# Patient Record
Sex: Male | Born: 1945
Health system: Southern US, Community
[De-identification: ages and names within clinical notes are randomized; demographics above are authoritative.]

## PROBLEM LIST (undated history)

## (undated) DIAGNOSIS — Z95810 Presence of automatic (implantable) cardiac defibrillator: Secondary | ICD-10-CM

## (undated) DIAGNOSIS — C61 Malignant neoplasm of prostate: Secondary | ICD-10-CM

## (undated) DIAGNOSIS — A419 Sepsis, unspecified organism: Secondary | ICD-10-CM

## (undated) DIAGNOSIS — Z973 Presence of spectacles and contact lenses: Secondary | ICD-10-CM

## (undated) DIAGNOSIS — IMO0002 Reserved for concepts with insufficient information to code with codable children: Secondary | ICD-10-CM

## (undated) DIAGNOSIS — I472 Ventricular tachycardia, unspecified: Secondary | ICD-10-CM

## (undated) DIAGNOSIS — R6521 Severe sepsis with septic shock: Secondary | ICD-10-CM

## (undated) DIAGNOSIS — F419 Anxiety disorder, unspecified: Secondary | ICD-10-CM

## (undated) DIAGNOSIS — I519 Heart disease, unspecified: Secondary | ICD-10-CM

## (undated) DIAGNOSIS — I428 Other cardiomyopathies: Secondary | ICD-10-CM

## (undated) DIAGNOSIS — R972 Elevated prostate specific antigen [PSA]: Secondary | ICD-10-CM

## (undated) HISTORY — PX: APPENDECTOMY: SHX54

## (undated) HISTORY — DX: Heart disease, unspecified: I51.9

## (undated) HISTORY — DX: Anxiety disorder, unspecified: F41.9

## (undated) HISTORY — DX: Ventricular tachycardia: I47.2

## (undated) HISTORY — DX: Reserved for concepts with insufficient information to code with codable children: IMO0002

## (undated) HISTORY — DX: Ventricular tachycardia, unspecified: I47.20

## (undated) HISTORY — DX: Sepsis, unspecified organism: A41.9

## (undated) HISTORY — DX: Other cardiomyopathies: I42.8

## (undated) HISTORY — DX: Severe sepsis with septic shock: R65.21

---

## 2000-05-03 ENCOUNTER — Encounter (INDEPENDENT_AMBULATORY_CARE_PROVIDER_SITE_OTHER): Payer: Self-pay

## 2000-05-03 ENCOUNTER — Inpatient Hospital Stay (HOSPITAL_COMMUNITY): Admission: EM | Admit: 2000-05-03 | Discharge: 2000-05-23 | Payer: Self-pay | Admitting: Emergency Medicine

## 2000-05-03 ENCOUNTER — Encounter: Payer: Self-pay | Admitting: Emergency Medicine

## 2000-05-08 ENCOUNTER — Encounter: Payer: Self-pay | Admitting: General Surgery

## 2000-05-10 ENCOUNTER — Encounter: Payer: Self-pay | Admitting: General Surgery

## 2000-05-11 ENCOUNTER — Encounter: Payer: Self-pay | Admitting: General Surgery

## 2000-05-13 ENCOUNTER — Encounter: Payer: Self-pay | Admitting: General Surgery

## 2000-05-14 ENCOUNTER — Encounter: Payer: Self-pay | Admitting: General Surgery

## 2000-05-14 ENCOUNTER — Encounter: Payer: Self-pay | Admitting: Anesthesiology

## 2000-11-18 ENCOUNTER — Encounter (INDEPENDENT_AMBULATORY_CARE_PROVIDER_SITE_OTHER): Payer: Self-pay | Admitting: Specialist

## 2000-11-18 ENCOUNTER — Other Ambulatory Visit: Admission: RE | Admit: 2000-11-18 | Discharge: 2000-11-18 | Payer: Self-pay | Admitting: Urology

## 2000-12-06 ENCOUNTER — Encounter (INDEPENDENT_AMBULATORY_CARE_PROVIDER_SITE_OTHER): Payer: Self-pay | Admitting: Specialist

## 2000-12-06 ENCOUNTER — Other Ambulatory Visit: Admission: RE | Admit: 2000-12-06 | Discharge: 2000-12-06 | Payer: Self-pay | Admitting: Urology

## 2001-02-17 ENCOUNTER — Encounter: Admission: RE | Admit: 2001-02-17 | Discharge: 2001-02-17 | Payer: Self-pay | Admitting: General Surgery

## 2001-02-17 ENCOUNTER — Encounter: Payer: Self-pay | Admitting: General Surgery

## 2001-02-18 ENCOUNTER — Ambulatory Visit (HOSPITAL_BASED_OUTPATIENT_CLINIC_OR_DEPARTMENT_OTHER): Admission: RE | Admit: 2001-02-18 | Discharge: 2001-02-18 | Payer: Self-pay | Admitting: General Surgery

## 2009-02-14 ENCOUNTER — Ambulatory Visit: Payer: Self-pay | Admitting: Cardiovascular Disease

## 2009-02-14 ENCOUNTER — Inpatient Hospital Stay (HOSPITAL_COMMUNITY): Admission: EM | Admit: 2009-02-14 | Discharge: 2009-02-20 | Payer: Self-pay | Admitting: Emergency Medicine

## 2009-02-15 ENCOUNTER — Encounter: Payer: Self-pay | Admitting: Internal Medicine

## 2009-02-15 ENCOUNTER — Encounter: Payer: Self-pay | Admitting: Cardiovascular Disease

## 2009-02-18 ENCOUNTER — Encounter: Payer: Self-pay | Admitting: Cardiovascular Disease

## 2009-02-19 HISTORY — PX: CARDIAC DEFIBRILLATOR PLACEMENT: SHX171

## 2009-02-20 ENCOUNTER — Encounter: Payer: Self-pay | Admitting: Internal Medicine

## 2009-02-22 ENCOUNTER — Encounter: Payer: Self-pay | Admitting: Internal Medicine

## 2009-03-07 ENCOUNTER — Encounter: Payer: Self-pay | Admitting: Internal Medicine

## 2009-03-07 ENCOUNTER — Ambulatory Visit: Payer: Self-pay

## 2009-03-08 ENCOUNTER — Telehealth (INDEPENDENT_AMBULATORY_CARE_PROVIDER_SITE_OTHER): Payer: Self-pay | Admitting: *Deleted

## 2009-03-12 ENCOUNTER — Encounter: Payer: Self-pay | Admitting: Internal Medicine

## 2009-03-21 DIAGNOSIS — R55 Syncope and collapse: Secondary | ICD-10-CM

## 2009-03-21 DIAGNOSIS — Z85828 Personal history of other malignant neoplasm of skin: Secondary | ICD-10-CM

## 2009-03-21 DIAGNOSIS — I472 Ventricular tachycardia: Secondary | ICD-10-CM

## 2009-03-25 ENCOUNTER — Ambulatory Visit: Payer: Self-pay | Admitting: Internal Medicine

## 2009-03-25 DIAGNOSIS — I4891 Unspecified atrial fibrillation: Secondary | ICD-10-CM

## 2009-05-12 ENCOUNTER — Emergency Department (HOSPITAL_COMMUNITY): Admission: EM | Admit: 2009-05-12 | Discharge: 2009-05-12 | Payer: Self-pay | Admitting: Emergency Medicine

## 2009-05-12 ENCOUNTER — Ambulatory Visit: Payer: Self-pay | Admitting: Cardiology

## 2009-05-12 ENCOUNTER — Telehealth (INDEPENDENT_AMBULATORY_CARE_PROVIDER_SITE_OTHER): Payer: Self-pay | Admitting: Physician Assistant

## 2009-05-31 ENCOUNTER — Ambulatory Visit: Payer: Self-pay | Admitting: Internal Medicine

## 2009-09-02 ENCOUNTER — Ambulatory Visit: Payer: Self-pay | Admitting: Internal Medicine

## 2009-12-04 ENCOUNTER — Ambulatory Visit: Payer: Self-pay | Admitting: Internal Medicine

## 2009-12-06 ENCOUNTER — Encounter: Payer: Self-pay | Admitting: Internal Medicine

## 2010-03-05 ENCOUNTER — Ambulatory Visit: Payer: Self-pay | Admitting: Internal Medicine

## 2010-03-06 ENCOUNTER — Ambulatory Visit: Payer: Self-pay | Admitting: Internal Medicine

## 2010-03-12 ENCOUNTER — Encounter: Payer: Self-pay | Admitting: Internal Medicine

## 2010-04-03 ENCOUNTER — Telehealth: Payer: Self-pay | Admitting: Internal Medicine

## 2010-04-29 NOTE — Assessment & Plan Note (Signed)
Summary: pc2   Primary Provider:  [name]  CC:  pc2.  Pt wondering about genetic test results..  History of Present Illness: The patient presents today for routine electrophysiology followup. He reports doing very well since last being seen in our clinic. The patient denies symptoms of palpitations, chest pain, shortness of breath, orthopnea, PND, lower extremity edema, dizziness, presyncope, syncope, or neurologic sequela. The patient is tolerating medications without difficulties and is otherwise without complaint today.   Current Medications (verified): 1)  Metoprolol Tartrate 25 Mg Tabs (Metoprolol Tartrate) .... Take 1 1/2 Tablets By Mouth Twice A Day 2)  Vitamin E .... Once Daily 3)  Vitamin C .... Once Daily 4)  Fish Oil .... Once Daily 5)  Calcium, Magnesium, Zinc .... Once Daily  Allergies (verified): No Known Drug Allergies  Past History:  Past Medical History: Reviewed history from 03/25/2009 and no changes required. ICD (SJM) VENTRICULAR TACHYCARDIA ARRHYTHMOGENIC RIGHT VENTRICULAR DYSPLASIA ATRIAL FIBRILLATION AND ATRIAL FLUTTER    Past Surgical History: Reviewed history from 03/25/2009 and no changes required.  1. Appendectomy.   2.  implantation of a [employer]. Jude Medical Fortify DR implantable cardioverter-defibrillator for ventricular tachycardia     and syncope 02/19/09  Social History: Reviewed history from 03/21/2009 and no changes required. The patient is divorced with 1 child.  He is self-   employed.  He admits to social alcohol use.  He denies use of tobacco or   drugs.   Review of Systems       All systems are reviewed and negative except as listed in the HPI.   Vital Signs:  Patient profile:   65 year old male Height:      74 inches Weight:      221 pounds BMI:     28.48 Pulse rate:   61 / minute Pulse rhythm:   regular BP sitting:   122 / 84  (left arm) Cuff size:   regular  Vitals Entered By: Judithe Modest CMA (September 02, 2009 11:22  AM)  Physical Exam  General:  Well developed, well nourished, in no acute distress. Head:  normocephalic and atraumatic Eyes:  PERRLA/EOM intact; conjunctiva and lids normal. Mouth:  Teeth, gums and palate normal. Oral mucosa normal. Neck:  Neck supple, no JVD. No masses, thyromegaly or abnormal cervical nodes. Lungs:  Clear bilaterally to auscultation and percussion. Heart:  Non-displaced PMI, chest non-tender; regular rate and rhythm, S1, S2 without murmurs, rubs or gallops. Carotid upstroke normal, no bruit. Normal abdominal aortic size, no bruits. Femorals normal pulses, no bruits. Pedals normal pulses. No edema, no varicosities. Abdomen:  Bowel sounds positive; abdomen soft and non-tender without masses, organomegaly, or hernias noted. No hepatosplenomegaly. Msk:  Back normal, normal gait. Muscle strength and tone normal. Pulses:  pulses normal in all 4 extremities Extremities:  No clubbing or cyanosis. Neurologic:  Alert and oriented x 3.   PPM Specifications Following MD:  Hillis Range, MD     PPM Vendor:  [employer] Jude     PPM Model Number:  [id number]     PPM Serial Number:  [id number] DOI:  02/19/2009     PPM Implanting MD:  Hillis Range, MD  Lead 1    Location: RA     DOI: 02/19/2009     Model #: 1888TC     Serial #: [id number]     Status: active Lead 2    Location: RV     DOI: 02/19/2009     Model #:  [id number]     Serial #: [id number]     Status: active  ICD Specifications Following MD:  Hillis Range, MD     ICD Vendor:  St Jude     ICD Model Number:  [id number]     ICD Serial Number:  [id number] ICD DOI:  02/19/2009     ICD Implanting MD:  Hillis Range, MD  Lead 1:    Location: RA     DOI: 02/19/2009     Model #: 1888TC     Serial #: [id number]     Status: active Lead 2:    Location: RV     DOI: 02/19/2009     Model #: [id number]     Serial #: [id number]     Status: active  Indications::  ARVD   ICD Follow Up Battery Voltage:  87% V     Charge Time:  7.9 seconds     Battery Est. Longevity:   7.6-8.77YRS Underlying rhythm:  SR ICD Dependent:  No       ICD Device Measurements Atrium:  Amplitude: 3.8 mV, Impedance: 410 ohms, Threshold: 1.25 V at 0.5 msec Right Ventricle:  Amplitude: 6.6 mV, Impedance: 390 ohms, Threshold: 0.75 V at 0.5 msec Shock Impedance: 45 ohms   Episodes MS Episodes:  6     Percent Mode Switch:  <1%     Coumadin:  No Shock:  0     ATP:  5     Atrial Pacing:  <1%     Ventricular Pacing:  2.7%  Brady Parameters Mode VVI     Lower Rate Limit:  40      Tachy Zones VF:  200     VT:  171     Tech Comments:  ALL EPISODES AF W/RVR. WILL DISCUSS W/DR Oliana Gowens NEED FOR REPROGRAMMING TACHY ZONE.  NORMAL THRESHOLD TESTING. CHANGED VF # OF INTERVALS FROM 12 TO 18 PER DR Babita Amaker.  Vella Kohler  September 02, 2009 11:40 AM  Impression & Recommendations:  Problem # 1:  VENTRICULAR TACHYCARDIA (ICD-427.1)  Doing well without further VT Normal ICD function  His updated medication list for this problem includes:    Metoprolol Tartrate 50 Mg Tabs (Metoprolol tartrate) ..... One by mouth two times a day  Problem # 2:  ATRIAL FIBRILLATION (ICD-427.31)  short episodes of afib with RVR will increase metoprolol to 50mg  two times a day today ASA 325mg  daily (CHADS 2 score is 0)  His updated medication list for this problem includes:    Metoprolol Tartrate 50 Mg Tabs (Metoprolol tartrate) ..... One by mouth two times a day  Problem # 3:  * ARRHYTHMOGENIC RIGHT VENTRICULAR DYSPLASIA Normal ICD function genetic testing was indeterminant we will follow  Patient Instructions: 1)  Your physician recommends that you schedule a follow-up appointment in: 3 MONTHS with Dr Johney Frame 2)  Your physician has recommended you make the following change in your medication: increase Metoprolol to 50mg  two times a day Prescriptions: METOPROLOL TARTRATE 50 MG TABS (METOPROLOL TARTRATE) one by mouth two times a day  #60 x 3   Entered by:   Dennis Bast, RN, BSN   Authorized by:   Hillis Range, MD   Signed by:   Dennis Bast, RN, BSN on 09/02/2009   Method used:   Electronically to        [facility] #[id number]* (retail)       [address]       [place]  [place], [place]  [address]       Ph: [phone]       Fax: [phone]   RxID:   [id number] METOPROLOL TARTRATE 50 MG TABS (METOPROLOL TARTRATE) one by mouth two times a day  #60 x 3   Entered by:   Dennis Bast, RN, BSN   Authorized by:   Hillis Range, MD   Signed by:   Dennis Bast, RN, BSN on 09/02/2009   Method used:   Print then Give to Patient   RxID:   [id number]

## 2010-04-29 NOTE — Letter (Signed)
Summary: [facility]   Imported By: Roderic Ovens 06/20/2009 11:11:35  _____________________________________________________________________  External Attachment:    Type:   Image     Comment:   External Document

## 2010-04-29 NOTE — Assessment & Plan Note (Signed)
Summary: rov/ gd   Visit Type:  Follow-up Primary Provider:  [name]   History of Present Illness: The patient presents today for routine electrophysiology followup. He reports doing very well since his recent ER visit after receiving a shock for afib with RVR.  He reports compliance with beta blockers since that time. The patient denies symptoms of palpitations, chest pain, shortness of breath, orthopnea, PND, lower extremity edema, dizziness, presyncope, syncope, or neurologic sequela. The patient is tolerating medications without difficulties and is otherwise without complaint today.   Current Medications (verified): 1)  Metoprolol Tartrate 25 Mg Tabs (Metoprolol Tartrate) .... Take 1 1/2 Tablets By Mouth Twice A Day 2)  Vitamin E .... Once Daily 3)  Vitamin C .... Once Daily 4)  Fish Oil .... Once Daily 5)  Calcium, Magnesium, Zinc .... Once Daily  Allergies (verified): No Known Drug Allergies  Past History:  Past Medical History: Reviewed history from 03/25/2009 and no changes required. ICD (SJM) VENTRICULAR TACHYCARDIA ARRHYTHMOGENIC RIGHT VENTRICULAR DYSPLASIA ATRIAL FIBRILLATION AND ATRIAL FLUTTER    Past Surgical History: Reviewed history from 03/25/2009 and no changes required.  1. Appendectomy.   2.  implantation of a [employer]. Jude Medical Fortify DR implantable cardioverter-defibrillator for ventricular tachycardia     and syncope 02/19/09  Social History: Reviewed history from 03/21/2009 and no changes required. The patient is divorced with 1 child.  He is self-   employed.  He admits to social alcohol use.  He denies use of tobacco or   drugs.   Review of Systems       All systems are reviewed and negative except as listed in the HPI.   Vital Signs:  Patient profile:   65 year old male Height:      74 inches Weight:      218 pounds BMI:     28.09 Pulse rate:   68 / minute BP sitting:   100 / 56  (left arm)  Vitals Entered By: Laurance Flatten CMA (May 31, 2009 11:21 AM)  Physical Exam  General:  Well developed, well nourished, in no acute distress. Head:  normocephalic and atraumatic Eyes:  PERRLA/EOM intact; conjunctiva and lids normal. Mouth:  Teeth, gums and palate normal. Oral mucosa normal. Neck:  Neck supple, no JVD. No masses, thyromegaly or abnormal cervical nodes. Chest Wall:  ICD pocket is well healed Lungs:  Clear bilaterally to auscultation and percussion. Heart:  Non-displaced PMI, chest non-tender; regular rate and rhythm, S1, S2 without murmurs, rubs or gallops. Carotid upstroke normal, no bruit. Normal abdominal aortic size, no bruits. Femorals normal pulses, no bruits. Pedals normal pulses. No edema, no varicosities. Abdomen:  Bowel sounds positive; abdomen soft and non-tender without masses, organomegaly, or hernias noted. No hepatosplenomegaly. Msk:  Back normal, normal gait. Muscle strength and tone normal. Pulses:  pulses normal in all 4 extremities Extremities:  No clubbing or cyanosis. Neurologic:  Alert and oriented x 3.  strength/sensation are intact Skin:  Intact without lesions or rashes. Cervical Nodes:  no significant adenopathy Psych:  Normal affect.   PPM Specifications Following MD:  Hillis Range, MD     PPM Vendor:  [employer] Jude     PPM Model Number:  [id number]     PPM Serial Number:  [id number] PPM DOI:  02/19/2009     PPM Implanting MD:  Hillis Range, MD  Lead 1    Location: RA     DOI: 02/19/2009     Model #: [id number]  Serial #: [id number]     Status: active Lead 2    Location: RV     DOI: 02/19/2009     Model #: 1308     Serial #: [id number]     Status: active  ICD Specifications Following MD:  Hillis Range, MD     ICD Vendor:  [employer] Jude     ICD Model Number:  [phone]     ICD Serial Number:  [id number] ICD DOI:  02/19/2009     ICD Implanting MD:  Hillis Range, MD  Lead 1:    Location: RA     DOI: 02/19/2009     Model #: 1888TC     Serial #: [id number]     Status: active Lead 2:    Location: RV     DOI: 02/19/2009      Model #: 4403     Serial #: [id number]     Status: active  Indications::  ARVD   ICD Follow Up Remote Check?  No Battery Voltage:  90% V     Charge Time:  8 seconds     Battery Est. Longevity:  8.8 YEARS Underlying rhythm:  SR ICD Dependent:  No       ICD Device Measurements Atrium:  Amplitude: 3.9 mV, Impedance: 460 ohms,  Right Ventricle:  Amplitude: 11.8 mV, Impedance: 440 ohms, Threshold: 0.75 V at 0.5 msec Shock Impedance: 47 ohms   Episodes Percent Mode Switch:  1.3%     Coumadin:  No Shock:  0     ATP:  1     Nonsustained:  0     Ventricular Pacing:  5.9%  Brady Parameters Mode VVI     Lower Rate Limit:  40      Tachy Zones VF:  200     VT:  171     Tech Comments:  Normal device function.  RV output decreased to 2.5V today.  Pt had 1 episode of VT in VF zone that terminated with ATP during charging.  No details on mode switch episodes because programmed VVI.  ROV per Dr Johney Frame. Gypsy Balsam RN BSN  May 31, 2009 11:29 AM  MD Comments:  agree  Impression & Recommendations:  Problem # 1:  VENTRICULAR TACHYCARDIA (ICD-427.1) The patient has a h/o vt.  He is doing well with low dose metoprolol. No changes today. His updated medication list for this problem includes:    Metoprolol Tartrate 25 Mg Tabs (Metoprolol tartrate) .[name]... Take 1 1/2 tablets by mouth twice a day  Problem # 2:  ATRIAL FIBRILLATION (ICD-427.31) The patient has been documented to have paroxysmal afib with his ICD.  He recently received a shock for afib with RVR when off of metoprolol (compliance).  I have stressed the importance of metoprolol compliance.  If he has further afib, then I will consider admission for sotalol load.  His CHADs2 score is 0.  We will therefore continue daily ASA 325mg  daily at this time. His updated medication list for this problem includes:    Metoprolol Tartrate 25 Mg Tabs (Metoprolol tartrate) .[name]... Take 1 1/2 tablets by mouth twice a day  Problem # 3:  AICD [employer] [employer]   MODEL 1888TC- 46 (ICD-V45.02) Normal ICD function as above  Patient Instructions: 1)  Your physician recommends that you schedule a follow-up appointment in: 3 months with Dr Johney Frame

## 2010-04-29 NOTE — Letter (Signed)
Summary: Device-Delinquent Phone Journalist, newspaper, Main Office  [address]   [place], [place] [address]   Phone: [phone]  Fax: [phone]     December 06, 2009 MRN: [id number]   [facility] [place] [address] [place], [place]  [address]   Dear Robert Ochoa,  According to our records, you were scheduled for a device phone transmission on 12-05-2009.     We did not receive any results from this check.  If you transmitted on your scheduled day, please call us to help troubleshoot your system.  If you forgot to send your transmission, please send one upon receipt of this letter.  Thank you,   Architectural technologist [facility]

## 2010-04-29 NOTE — Assessment & Plan Note (Signed)
Summary: per check out/sf   Visit Type:  Pacemaker check Primary Kirti Carl:  Robert Ochoa   History of Present Illness: The patient presents today for routine electrophysiology followup. He reports doing very well since last being seen in our clinic. The patient denies symptoms of palpitations, chest pain, shortness of breath, orthopnea, PND, lower extremity edema, dizziness, presyncope, syncope, or neurologic sequela. The patient is tolerating medications without difficulties and is otherwise without complaint today.   Current Medications (verified): 1)  Metoprolol Tartrate 50 Mg Tabs (Metoprolol Tartrate) .[name].. 1 1/2 Tablets  By Mouth Two Times A Day. or As Directed. 2)  Vitamin E 400 Unit Caps (Vitamin E) .[name].. 1 Cap Once Daily 3)  Vitamin C 500 Mg Tabs (Ascorbic Acid) .[name].. 1 Tab Once Daily 4)  Fish Oil 1000 Mg Caps (Omega-3 Fatty Acids) .[name].. 1 Cap Once Daily 5)  Calcium-Magnesium-Zinc 333-133-8.3 Mg Tabs (Calcium-Magnesium-Zinc) .[name].. 1 Tab Once Daily  Allergies (verified): No Known Drug Allergies  Past History:  Past Medical History: Reviewed history from 03/25/2009 and no changes required. ICD (SJM) VENTRICULAR TACHYCARDIA ARRHYTHMOGENIC RIGHT VENTRICULAR DYSPLASIA ATRIAL FIBRILLATION AND ATRIAL FLUTTER    Past Surgical History: Reviewed history from 03/25/2009 and no changes required.  1. Appendectomy.   2.  implantation of a [employer]. Jude Medical Fortify DR implantable cardioverter-defibrillator for ventricular tachycardia     and syncope 02/19/09  Social History: Reviewed history from 03/21/2009 and no changes required. The patient is divorced with 1 child.  He is self-   employed.  He admits to social alcohol use.  He denies use of tobacco or   drugs.   Review of Systems       All systems are reviewed and negative except as listed in the HPI.   Vital Signs:  Patient profile:   65 year old male Height:      74 inches Weight:      227.25 pounds BMI:     29.28 Pulse rate:    62 / minute Pulse rhythm:   regular BP sitting:   122 / 88  (right arm) Cuff size:   large  Vitals Entered By: Robert Ochoa, CMA (March 05, 2010 11:01 AM)  Physical Exam  General:  Well developed, well nourished, in no acute distress. Head:  normocephalic and atraumatic Eyes:  PERRLA/EOM intact; conjunctiva and lids normal. Mouth:  Teeth, gums and palate normal. Oral mucosa normal. Neck:  Neck supple, no JVD. No masses, thyromegaly or abnormal cervical nodes. Chest Wall:  ICD pocket is well healed Lungs:  Clear bilaterally to auscultation and percussion. Heart:  Non-displaced PMI, chest non-tender; regular rate and rhythm, S1, S2 without murmurs, rubs or gallops. Carotid upstroke normal, no bruit. Normal abdominal aortic size, no bruits. Femorals normal pulses, no bruits. Pedals normal pulses. No edema, no varicosities. Abdomen:  Bowel sounds positive; abdomen soft and non-tender without masses, organomegaly, or hernias noted. No hepatosplenomegaly. Msk:  Back normal, normal gait. Muscle strength and tone normal. Extremities:  No clubbing or cyanosis. Neurologic:  Alert and oriented x 3.   PPM Specifications Following MD:  Hillis Range, MD     PPM Vendor:  [employer] Jude     PPM Model Number:  [id number]     PPM Serial Number:  [id number] PPM DOI:  02/19/2009     PPM Implanting MD:  Hillis Range, MD  Lead 1    Location: RA     DOI: 02/19/2009     Model #: [id number]  Serial #: [id number]     Status: active Lead 2    Location: RV     DOI: 02/19/2009     Model #: 4098     Serial #: [id number]     Status: active  PPM Follow Up Pacer Dependent:  No      Episodes Coumadin:  No  ICD Specifications Following MD:  Hillis Range, MD     ICD Vendor:  St Jude     ICD Model Number:  [id number]     ICD Serial Number:  [id number] ICD DOI:  02/19/2009     ICD Implanting MD:  Hillis Range, MD  Lead 1:    Location: RA     DOI: 02/19/2009     Model #: 1888TC     Serial #: [id number]     Status: active Lead 2:     Location: RV     DOI: 02/19/2009     Model #: 4132     Serial #: [id number]     Status: active  Indications::  ARVD   ICD Follow Up Remote Check?  No Battery Voltage:  good V     Charge Time:  8.5 seconds     Battery Est. Longevity:  7.7 years Underlying rhythm:  Brady ICD Dependent:  No       ICD Device Measurements Atrium:  Amplitude: 2.6 mV, Impedance: 450 ohms, Threshold: 1.25 V at 0.5 msec Right Ventricle:  Amplitude: 7.1 mV, Impedance: 410 ohms, Threshold: 0.75 V at 0.5 msec Shock Impedance: 50 ohms   Episodes MS Episodes:  1     Percent Mode Switch:  <1%     Coumadin:  No Shock:  0     ATP:  0     Nonsustained:  0     Atrial Pacing:  1.5%     Ventricular Pacing:  4.5%  Brady Parameters Mode VVI     Lower Rate Limit:  40      Tachy Zones VF:  200     VT:  171     Next Cardiology Appt Due:  05/29/2010 Tech Comments:  No parameter changes.  Device function normal.  No Merlin @ this time. ROV 3 months clinic. Altha Harm, LPN  March 05, 2010 11:44 AM  MD Comments:  agree 22 second episode of afib x 1 no ventricular arrhythmias  Impression & Recommendations:  Problem # 1:  ATRIAL FIBRILLATION (ICD-427.31) stable no changes as this time  Problem # 2:  VENTRICULAR TACHYCARDIA (ICD-427.1) controlled no changes normal ICD function as above  Problem # 3:  * ARRHYTHMOGENIC RIGHT VENTRICULAR DYSPLASIA stable without symptoms of CHF  Patient Instructions: 1)  Your physician recommends that you schedule a follow-up appointment in: 3 months with [facility]. 2)  Your physician recommends that you continue on your current medications as directed. Please refer to the Current Medication list given to you today. 3)  Your physician wants you to follow-up in: 6 months.  You will receive a reminder letter in the mail two months in advance. If you don't receive a letter, please call our office to schedule the follow-up appointment.

## 2010-04-29 NOTE — Assessment & Plan Note (Signed)
Summary: per check out/sf   Visit Type:  Follow-up Primary Provider:  [name]   History of Present Illness: The patient presents today for routine electrophysiology followup. He reports doing very well since last being seen in our clinic. The patient denies symptoms of palpitations, chest pain, shortness of breath, orthopnea, PND, lower extremity edema, dizziness, presyncope, syncope, or neurologic sequela. The patient is tolerating medications without difficulties and is otherwise without complaint today.   Current Medications (verified): 1)  Metoprolol Tartrate 50 Mg Tabs (Metoprolol Tartrate) .... One By Mouth Two Times A Day 2)  Vitamin E .... Once Daily 3)  Vitamin C .... Once Daily 4)  Fish Oil .... Once Daily 5)  Calcium, Magnesium, Zinc .... Once Daily  Allergies (verified): No Known Drug Allergies  Past History:  Past Medical History: Reviewed history from 03/25/2009 and no changes required. ICD (SJM) VENTRICULAR TACHYCARDIA ARRHYTHMOGENIC RIGHT VENTRICULAR DYSPLASIA ATRIAL FIBRILLATION AND ATRIAL FLUTTER    Past Surgical History: Reviewed history from 03/25/2009 and no changes required.  1. Appendectomy.   2.  implantation of a St. Jude Medical Fortify DR implantable cardioverter-defibrillator for ventricular tachycardia     and syncope 02/19/09  Social History: Reviewed history from 03/21/2009 and no changes required. The patient is divorced with 1 child.  He is self-   employed.  He admits to social alcohol use.  He denies use of tobacco or   drugs.   Review of Systems       All systems are reviewed and negative except as listed in the HPI.   Vital Signs:  Patient profile:   65 year old male Height:      74 inches Weight:      224 pounds BMI:     28.86 Pulse rate:   59 / minute BP sitting:   120 / 82  (left arm)  Vitals Entered By: Laurance Flatten CMA (December 04, 2009 10:51 AM)  Physical Exam  General:  Well developed, well nourished, in no acute  distress. Head:  normocephalic and atraumatic Eyes:  PERRLA/EOM intact; conjunctiva and lids normal. Mouth:  Teeth, gums and palate normal. Oral mucosa normal. Neck:  Neck supple, no JVD. No masses, thyromegaly or abnormal cervical nodes. Chest Wall:  ICD pocket is well healed Lungs:  Clear bilaterally to auscultation and percussion. Heart:  Non-displaced PMI, chest non-tender; regular rate and rhythm, S1, S2 without murmurs, rubs or gallops. Carotid upstroke normal, no bruit. Normal abdominal aortic size, no bruits. Femorals normal pulses, no bruits. Pedals normal pulses. No edema, no varicosities. Abdomen:  Bowel sounds positive; abdomen soft and non-tender without masses, organomegaly, or hernias noted. No hepatosplenomegaly. Msk:  Back normal, normal gait. Muscle strength and tone normal. Pulses:  pulses normal in all 4 extremities Extremities:  No clubbing or cyanosis. Neurologic:  Alert and oriented x 3.   PPM Specifications Following MD:  Hillis Range, MD     PPM Vendor:  St Jude     PPM Model Number:  [id number]     PPM Serial Number:  [id number] PPM DOI:  02/19/2009     PPM Implanting MD:  Hillis Range, MD  Lead 1    Location: [facility]     DOI: 02/19/2009     Model #: [id number]     Serial #: [id number]     Status: active Lead 2    Location: RV     DOI: 02/19/2009     Model #: [id number]     Serial #:  [id number]     Status: active  PPM Follow Up Remote Check?  No Pacer Dependent:  No      Episodes Coumadin:  No  ICD Specifications Following MD:  Hillis Range, MD     ICD Vendor:  St Jude     ICD Model Number:  [phone]     ICD Serial Number:  [id number] ICD DOI:  02/19/2009     ICD Implanting MD:  Hillis Range, MD  Lead 1:    Location: RA     DOI: 02/19/2009     Model #: [id number]     Serial #: [id number]     Status: active Lead 2:    Location: RV     DOI: 02/19/2009     Model #: 2694     Serial #: [id number]     Status: active  Indications::  ARVD   ICD Follow Up Remote Check?  No Charge Time:  8.0  seconds     Battery Est. Longevity:  7.5 years Underlying rhythm:  SR ICD Dependent:  No       ICD Device Measurements Atrium:  Amplitude: 3.3 mV, Impedance: 490 ohms, Threshold: 1.25 V at 0.5 msec Right Ventricle:  Amplitude: 7.7 mV, Impedance: 440 ohms, Threshold: 0.75 V at 0.5 msec Shock Impedance: 56 ohms   Episodes MS Episodes:  3     Coumadin:  No Shock:  0     ATP:  0     Nonsustained:  2     Atrial Pacing:  <1%     Ventricular Pacing:  2.7%  Brady Parameters Mode VVI     Lower Rate Limit:  40      Tachy Zones VF:  200     VT:  171     Next Remote Date:  03/06/2010     Next Cardiology Appt Due:  11/29/2010 Tech Comments:  2 VT episodes treated with ATP for A-fib with RVR.  No parameter changes.  Merlin transmissions every 3 months.  ROV 1 year with Dr. Johney Frame. Altha Harm, LPN  December 04, 2009 11:54 AM  MD Comments:  agree.  PT with ATP for afib with RVR.  Impression & Recommendations:  Problem # 1:  ATRIAL FIBRILLATION (ICD-427.31) The patient continues to have very rare and short episodes of afib, though V rates are elevated. We will increase metoprolol to 75mg  two times a day.  I have offered inpatient initiation of sotalol, however, he wishes to continue metoprolol at this time. Continue ASA 325mg  daily for CHADS2 score of 0.  Problem # 2:  AICD ST. J[address] T[address] ST  MODEL 1888TC- 46 (ICD-V45.02) as above  Problem # 3:  VENTRICULAR TACHYCARDIA (ICD-427.1) no further episodes  Problem # 4:  * ARRHYTHMOGENIC RIGHT VENTRICULAR DYSPLASIA stable as above  Patient Instructions: 1)  Your physician recommends that you schedule a follow-up appointment in: 3 months 2)  Your physician has recommended you make the following change in your medication: increase Metoprolol to 75mg  twice a day.
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
## 2010-04-29 NOTE — Cardiovascular Report (Signed)
Summary: Office Visit   Office Visit   Imported By: Roderic Ovens 12/10/2009 12:29:58  _____________________________________________________________________  External Attachment:    Type:   Image     Comment:   External Document

## 2010-05-01 NOTE — Cardiovascular Report (Signed)
Summary: Office Visit   Office Visit   Imported By: Roderic Ovens 03/13/2010 11:52:32  _____________________________________________________________________  External Attachment:    Type:   Image     Comment:   External Document

## 2010-05-01 NOTE — Progress Notes (Signed)
Summary: refill request  Phone Note Refill Request Message from:  Patient  Refills Requested: Medication #1:  METOPROLOL TARTRATE 50 MG TABS 1 1/2 tablets  by mouth two times a day. or as directed. pt wants refill of 25mg  metoprolol since he takes 1 and a half tabs and he has to cut them in half/[facility]   Method Requested: Telephone to Pharmacy Initial call taken by: Glynda Jaeger,  April 03, 2010 10:31 AM  Follow-up for Phone Call        RX called into pharmacy for metoprolol taqrt 25 and 50mg  1 by mouth two times a day per pt request. Pt notified.  Marrion Coy, CNA  April 03, 2010 11:57 AM  Follow-up by: Marrion Coy, CNA,  April 03, 2010 11:57 AM

## 2010-05-01 NOTE — Letter (Signed)
Summary: Device-Delinquent Phone Journalist, newspaper, Main Office  [address]   [place], [place] [address]   Phone: [phone]  Fax: [phone]     March 12, 2010 MRN: [id number]   [facility] [address] [place], [place]  [address]   Dear Robert Ochoa,  According to our records, you were scheduled for a device phone transmission on 03-06-2010.     We did not receive any results from this check.  If you transmitted on your scheduled day, please call us to help troubleshoot your system.  If you forgot to send your transmission, please send one upon receipt of this letter.  Thank you,   Architectural technologist [facility]

## 2010-05-07 NOTE — Letter (Signed)
Summary: [facility] Test Report  [facility] Test Report   Imported By: Marylou Mccoy 04/30/2010 11:30:22  _____________________________________________________________________  External Attachment:    Type:   Image     Comment:   External Document

## 2010-05-27 ENCOUNTER — Encounter (INDEPENDENT_AMBULATORY_CARE_PROVIDER_SITE_OTHER): Payer: Self-pay | Admitting: *Deleted

## 2010-06-05 NOTE — Letter (Signed)
Summary: Appointment - Reschedule  [employer], Main Office  [address]   [place], [place] [address]   Phone: [phone]  Fax: [phone]     May 27, 2010 MRN: [id number]   [facility] [address] [place], [place]  [address]   Dear Robert Ochoa,   Due to a change in our office schedule, your appointment on  06-04-10   at  9:30a             must be changed.  It is very important that we reach you to reschedule this appointment. We look forward to participating in your health care needs. Please contact us at the number listed above at your earliest convenience to reschedule this appointment.     Sincerely,  Glass blower/designer

## 2010-06-11 ENCOUNTER — Encounter (INDEPENDENT_AMBULATORY_CARE_PROVIDER_SITE_OTHER): Payer: Medicare Other | Admitting: Internal Medicine

## 2010-06-11 ENCOUNTER — Encounter: Payer: Self-pay | Admitting: Internal Medicine

## 2010-06-11 DIAGNOSIS — I472 Ventricular tachycardia, unspecified: Secondary | ICD-10-CM

## 2010-06-11 DIAGNOSIS — I4891 Unspecified atrial fibrillation: Secondary | ICD-10-CM

## 2010-06-17 NOTE — Assessment & Plan Note (Signed)
Summary: pcp/lg  [facility] pt rs appt per our resquest/mt   Visit Type:  Follow-up Primary Provider:  [name]   History of Present Illness: The patient presents today for routine electrophysiology followup. He reports doing very well since last being seen in our clinic. The patient denies symptoms of palpitations, chest pain, shortness of breath, orthopnea, PND, lower extremity edema, dizziness, presyncope, syncope, or neurologic sequela. He is unaware of any device therapy delivered.  The patient is tolerating medications without difficulties and is otherwise without complaint today.   Allergies: No Known Drug Allergies  Past History:  Past Medical History: Reviewed history from 03/25/2009 and no changes required. ICD (SJM) VENTRICULAR TACHYCARDIA ARRHYTHMOGENIC RIGHT VENTRICULAR DYSPLASIA ATRIAL FIBRILLATION AND ATRIAL FLUTTER    Past Surgical History: Reviewed history from 03/25/2009 and no changes required.  1. Appendectomy.   2.  implantation of a St. Jude Medical Fortify DR implantable cardioverter-defibrillator for ventricular tachycardia     and syncope 02/19/09  Social History: Reviewed history from 03/21/2009 and no changes required. The patient is divorced with 1 child.  He is self-   employed.  He admits to social alcohol use.  He denies use of tobacco or   drugs.   Review of Systems       All systems are reviewed and negative except as listed in the HPI.   Physical Exam  General:  Well developed, well nourished, in no acute distress. Head:  normocephalic and atraumatic Eyes:  PERRLA/EOM intact; conjunctiva and lids normal. Mouth:  Teeth, gums and palate normal. Oral mucosa normal. Neck:  supple Lungs:  Clear bilaterally to auscultation and percussion. Heart:  Non-displaced PMI, chest non-tender; regular rate and rhythm, S1, S2 without murmurs, rubs or gallops. Carotid upstroke normal, no bruit. Normal abdominal aortic size, no bruits. Femorals normal pulses, no  bruits. Pedals normal pulses. No edema, no varicosities. Abdomen:  Bowel sounds positive; abdomen soft and non-tender without masses, organomegaly, or hernias noted. No hepatosplenomegaly. Msk:  Back normal, normal gait. Muscle strength and tone normal. Extremities:  No clubbing or cyanosis. Neurologic:  Alert and oriented x 3. Skin:  Intact without lesions or rashes. Psych:  Normal affect.   PPM Specifications Following MD:  Hillis Range, MD     PPM Vendor:  St Jude     PPM Model Number:  [id number]     PPM Serial Number:  [id number] PPM DOI:  02/19/2009     PPM Implanting MD:  Hillis Range, MD  Lead 1    Location: RA     DOI: 02/19/2009     Model #: 1888TC     Serial #: [id number]     Status: active Lead 2    Location: RV     DOI: 02/19/2009     Model #: 9562     Serial #: [id number]     Status: active  PPM Follow Up Pacer Dependent:  No      Episodes Coumadin:  No  ICD Specifications Following MD:  Hillis Range, MD     ICD Vendor:  St Jude     ICD Model Number:  [phone]     ICD Serial Number:  [id number] ICD DOI:  02/19/2009     ICD Implanting MD:  Hillis Range, MD  Lead 1:    Location: RA     DOI: 02/19/2009     Model #: [id number]     Serial #: [id number]     Status: active Lead 2:    Location:  RV     DOI: 02/19/2009     Model #: 8657     Serial #: [id number]     Status: active  Indications::  ARVD   ICD Follow Up ICD Dependent:  No      Episodes Coumadin:  No  Brady Parameters Mode VVI     Lower Rate Limit:  40      Tachy Zones VF:  200     VT:  171     MD Comments:  see scanned report from [facility]  Impression & Recommendations:  Problem # 1:  VENTRICULAR TACHYCARDIA (ICD-427.1) The patient returns today for routine follow-up.  Interestingly, he had 6 episodes of VT (average CL ) all successfully treated with ATP x 1.  These episodes all occured between 10:53 and 11:02 am on 05/31/10.  The patient remembers exactly what he was doing at that time.  He was giving a passionate talk at an  [employer].  He denies any cardiac symptoms at the time. Interestingly, his VT is almost always brought on by emotional events. We will increase metoprolol to 75mg  two times a day today.  We will consider sotalol if further VT, though I would like to avoid AAD if possible.  Ablation would also be an option for him.  No ICD changes today.  See scanned report in [facility].  Problem # 2:  * ARRHYTHMOGENIC RIGHT VENTRICULAR DYSPLASIA as above  Problem # 3:  ATRIAL FIBRILLATION (ICD-427.31) no recent recurrence of afib continue ASA

## 2010-06-17 NOTE — Procedures (Signed)
Summary: pcp/lg  [facility] pt rs appt per our resquest/mt   Current Medications (verified): 1)  Metoprolol Tartrate 50 Mg Tabs (Metoprolol Tartrate) .[name].. 1 1/2 Tablets  By Mouth Two Times A Day. or As Directed. 2)  Vitamin C 500 Mg Tabs (Ascorbic Acid) .[name].. 1 Tab Once Daily 3)  Fish Oil 1000 Mg Caps (Omega-3 Fatty Acids) .[name].. 1 Cap Once Daily 4)  Calcium-Magnesium-Zinc 333-133-8.3 Mg Tabs (Calcium-Magnesium-Zinc) .[name].. 1 Tab Once Daily  Allergies (verified): No Known Drug Allergies  PPM Specifications Following MD:  Hillis Range, MD     PPM Vendor:  St Jude     PPM Model Number:  [id number]     PPM Serial Number:  [id number] PPM DOI:  02/19/2009     PPM Implanting MD:  Hillis Range, MD  Lead 1    Location: RA     DOI: 02/19/2009     Model #: 1888TC     Serial #: [id number]     Status: active Lead 2    Location: RV     DOI: 02/19/2009     Model #: 8469     Serial #: [id number]     Status: active  PPM Follow Up Pacer Dependent:  No      Episodes Coumadin:  No  ICD Specifications Following MD:  Hillis Range, MD     ICD Vendor:  St Jude     ICD Model Number:  [id number]     ICD Serial Number:  [id number] ICD DOI:  02/19/2009     ICD Implanting MD:  Hillis Range, MD  Lead 1:    Location: RA     DOI: 02/19/2009     Model #: 1888TC     Serial #: [id number]     Status: active Lead 2:    Location: RV     DOI: 02/19/2009     Model #: 2595     Serial #: [id number]     Status: active  Indications::  ARVD   ICD Follow Up ICD Dependent:  No      Episodes Coumadin:  No  Brady Parameters Mode VVI     Lower Rate Limit:  40      Tachy Zones VF:  200     VT:  171

## 2010-06-18 LAB — CBC
HCT: 46.7 % (ref 39.0–52.0)
Hemoglobin: 15.7 g/dL (ref 13.0–17.0)
MCV: 87.5 fL (ref 78.0–100.0)
Platelets: 170 10*3/uL (ref 150–400)
RDW: 13.8 % (ref 11.5–15.5)
WBC: 8.5 10*3/uL (ref 4.0–10.5)

## 2010-06-18 LAB — BASIC METABOLIC PANEL
BUN: 12 mg/dL (ref 6–23)
Chloride: 106 mEq/L (ref 96–112)
GFR calc non Af Amer: >60 mL/min (ref >60)
Glucose, Bld: 105 mg/dL — ABNORMAL HIGH (ref 70–99)
Potassium: 4.3 mEq/L (ref 3.5–5.1)
Sodium: 138 mEq/L (ref 135–145)

## 2010-06-18 LAB — POCT CARDIAC MARKERS: CKMB, poc: 4 ng/mL (ref 1.0–8.0)

## 2010-06-18 LAB — DIFFERENTIAL
Eosinophils Absolute: 0 10*3/uL (ref 0.0–0.7)
Eosinophils Relative: 0 % (ref 0–5)
Lymphocytes Relative: 12 % (ref 12–46)
Lymphs Abs: 1 10*3/uL (ref 0.7–4.0)
Monocytes Absolute: 0.8 10*3/uL (ref 0.1–1.0)

## 2010-06-26 NOTE — Cardiovascular Report (Signed)
Summary: Office Visit   Office Visit   Imported By: Roderic Ovens 06/17/2010 14:00:24  _____________________________________________________________________  External Attachment:    Type:   Image     Comment:   External Document

## 2010-07-02 LAB — CBC
Hemoglobin: 14.6 g/dL (ref 13.0–17.0)
Hemoglobin: 14.9 g/dL (ref 13.0–17.0)
Hemoglobin: 16.7 g/dL (ref 13.0–17.0)
MCHC: 33.9 g/dL (ref 30.0–36.0)
MCHC: 34.1 g/dL (ref 30.0–36.0)
Platelets: 160 10*3/uL (ref 150–400)
RBC: 4.95 MIL/uL (ref 4.22–5.81)
RBC: 4.97 MIL/uL (ref 4.22–5.81)
RBC: 5 MIL/uL (ref 4.22–5.81)
RBC: 5.02 MIL/uL (ref 4.22–5.81)
RBC: 5.63 MIL/uL (ref 4.22–5.81)
RDW: 13 % (ref 11.5–15.5)
RDW: 13.1 % (ref 11.5–15.5)
WBC: 5.4 10*3/uL (ref 4.0–10.5)
WBC: 6 10*3/uL (ref 4.0–10.5)
WBC: 6.3 10*3/uL (ref 4.0–10.5)
WBC: 6.4 10*3/uL (ref 4.0–10.5)

## 2010-07-02 LAB — DIFFERENTIAL
Basophils Absolute: 0 10*3/uL (ref 0.0–0.1)
Lymphocytes Relative: 19 % (ref 12–46)
Lymphocytes Relative: 26 % (ref 12–46)
Lymphs Abs: 1.1 10*3/uL (ref 0.7–4.0)
Lymphs Abs: 1.7 10*3/uL (ref 0.7–4.0)
Monocytes Absolute: 0.9 10*3/uL (ref 0.1–1.0)
Monocytes Relative: 15 % — ABNORMAL HIGH (ref 3–12)
Neutro Abs: 3.6 10*3/uL (ref 1.7–7.7)
Neutro Abs: 3.6 10*3/uL (ref 1.7–7.7)
Neutrophils Relative %: 64 % (ref 43–77)

## 2010-07-02 LAB — BASIC METABOLIC PANEL
BUN: 11 mg/dL (ref 6–23)
CO2: 27 mEq/L (ref 19–32)
Calcium: 8.7 mg/dL (ref 8.4–10.5)
Calcium: 8.9 mg/dL (ref 8.4–10.5)
Calcium: 9.8 mg/dL (ref 8.4–10.5)
Chloride: 104 mEq/L (ref 96–112)
Creatinine, Ser: 1.06 mg/dL (ref 0.4–1.5)
Creatinine, Ser: 1.18 mg/dL (ref 0.4–1.5)
GFR calc Af Amer: >60 mL/min (ref >60)
GFR calc Af Amer: >60 mL/min (ref >60)
GFR calc Af Amer: >60 mL/min (ref >60)
GFR calc non Af Amer: >60 mL/min (ref >60)
Glucose, Bld: 95 mg/dL (ref 70–99)
Potassium: 4 mEq/L (ref 3.5–5.1)
Sodium: 138 mEq/L (ref 135–145)
Sodium: 140 mEq/L (ref 135–145)

## 2010-07-02 LAB — POCT I-STAT, CHEM 8
BUN: 18 mg/dL (ref 6–23)
Chloride: 105 mEq/L (ref 96–112)
Creatinine, Ser: 0.8 mg/dL (ref 0.4–1.5)
Glucose, Bld: 98 mg/dL (ref 70–99)
Potassium: 4 mEq/L (ref 3.5–5.1)
Sodium: 139 mEq/L (ref 135–145)

## 2010-07-02 LAB — APTT: aPTT: 34 seconds (ref 24–37)

## 2010-07-02 LAB — PROTIME-INR
INR: 1.09 (ref 0.00–1.49)
INR: 1.1 (ref 0.00–1.49)
Prothrombin Time: 14.1 seconds (ref 11.6–15.2)

## 2010-07-02 LAB — HEPARIN LEVEL (UNFRACTIONATED): Heparin Unfractionated: 0.7 IU/mL (ref 0.30–0.70)

## 2010-07-02 LAB — LIPID PANEL
Cholesterol: 146 mg/dL (ref 0–200)
HDL: 43 mg/dL (ref >39)
Total CHOL/HDL Ratio: 3.4 RATIO
VLDL: 10 mg/dL (ref 0–40)

## 2010-07-02 LAB — COMPREHENSIVE METABOLIC PANEL
BUN: 17 mg/dL (ref 6–23)
Calcium: 8.6 mg/dL (ref 8.4–10.5)
Glucose, Bld: 119 mg/dL — ABNORMAL HIGH (ref 70–99)
Sodium: 138 mEq/L (ref 135–145)
Total Protein: 5.8 g/dL — ABNORMAL LOW (ref 6.0–8.3)

## 2010-07-02 LAB — CK TOTAL AND CKMB (NOT AT ARMC)
CK, MB: 3.7 ng/mL (ref 0.3–4.0)
Total CK: 172 U/L (ref 7–232)

## 2010-07-02 LAB — POCT CARDIAC MARKERS
CKMB, poc: 3 ng/mL (ref 1.0–8.0)
Myoglobin, poc: 129 ng/mL (ref 12–200)
Troponin i, poc: <0.05 ng/mL (ref 0.00–0.09)

## 2010-07-02 LAB — BRAIN NATRIURETIC PEPTIDE: Pro B Natriuretic peptide (BNP): 50 pg/mL (ref 0.0–100.0)

## 2010-07-02 LAB — CARDIAC PANEL(CRET KIN+CKTOT+MB+TROPI)
CK, MB: 3.4 ng/mL (ref 0.3–4.0)
Relative Index: 1.9 (ref 0.0–2.5)
Total CK: 179 U/L (ref 7–232)
Troponin I: 0.03 ng/mL (ref 0.00–0.06)

## 2010-08-01 ENCOUNTER — Encounter: Payer: Medicare Other | Admitting: Internal Medicine

## 2010-08-12 NOTE — Letter (Signed)
March 25, 2009     RE:  Robert Ochoa, Robert Ochoa  MRN:  [id number]  /  DOB:  01-12-1946   To whom it may concern:   I am writing this letter of medical necessity requesting genetic testing  for [facility].  Robert Ochoa is a very pleasant 65 year old gentleman  who recently presented with syncope and was found to have ventricular  tachycardia.  Though his left heart catheterization revealed  angiographically normal coronaries, a cardiac MRI revealed right  ventricular enlargement with a depressed right ventricular ejection  fraction as well as global hypokinesis and basal akinesis.  Signal  average EKG was 3/3 positive for arrhythmogenic right ventricular  dysplasia.  The patient has had paternal family members who have died  suddenly.  Given the patient's syncope and ventricular tachycardia, he  underwent defibrillator implantation for secondary prevention and  treatment of ventricular arrhythmias.  Since that time, the patient has  continued to have episodic ventricular tachycardia.  As I contemplate  further therapeutic strategies for his ventricular tachycardia, I  believe that genetic testing for arrhythmogenic right ventricular  dysplasia would be very beneficial.  Specifically, if the patient is  found to have arrhythmogenic right ventricular dysplasia, then my  inclination would be to avoid catheter ablation if possible.  However,  if he is found to not have right ventricular dysplasia by genetic  testing then I may be more likely to consider catheter ablation.  In  addition, genetic screening will be beneficial in directing the patient  in his lifestyle modifications.  He is eager to resume prior exercise  and rigorous activities.  If he is documented to have arrhythmogenic right ventricular dysplasia,  then his exercise will be significantly limited.  I appreciate your  consideration and assistance in genetic testing of Robert Ochoa  for arrhythmogenic right ventricular  dysplasia as the etiology for his  ventricular arrhythmias.    Sincerely,      Hillis Range, MD  Electronically Signed    JA/MedQ  DD: 03/25/2009  DT: 03/26/2009  Job #: [id number]

## 2010-08-15 NOTE — H&P (Signed)
[facility]  Patient:    Robert Ochoa, Robert Ochoa                          MRN: [id number] Adm. Date:  [id number] Disc. Date: [id number] Attending:  Henrene Dodge                         History and Physical  CHIEF COMPLAINT:  Severe abdominal pain.  HISTORY:  Robert Ochoa is a 65 year old Caucasian male who had lower abdominal pain approximately 10 days ago that was not well-localized and was seen in the [facility] where they did an examination and lab studies. His WBC count returned mildly elevated and he was contacted the following day and he said that he was feeling better and no further evaluations were determined at that time.  Over the next 10 days, he continued to have intermittent episodes of lower abdominal pain and pain predominantly in the back and this would occur, kind of go away, and then over the past 24 hours, he started having significant increase in pain and re-seen by Dr. Doreatha Lew in the [facility] on [address] and she referred him to [place] for evaluation of lower abdominal pain, thought to be appendicitis.  The patient states over the past two days, he has had bloating, decreased appetite, now more definite right lower quadrant tenderness with rebound, and was noted to have an elevated WBC count of 16,000 with a left shift.  PAST MEDICAL HISTORY:  He denies any previous surgery, denies any serious medical problems and is on no chronic medications.  ALLERGIES:  No allergies.  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  PHYSICAL EXAMINATION  VITAL SIGNS:  His temperature was 98.6, pulse 75, respirations 16, blood pressure 105/76.  GENERAL:  He was ______  male, not really in acute distress, but he was definitely tender in the lower abdomen, more so on the right.  LUNGS:  He has got good clear breath sounds  bilaterally.  CARDIAC:  Normal sinus rhythm, without murmur, gallop or rub.  ABDOMEN:  Decreased bowel sounds, with muscle guarding and rebound localized to the right lower quadrant.  RECTAL:  Unremarkable.  EXTREMITIES:  Negative.  NEUROLOGIC:  Unremarkable.  SKIN:  Unremarkable.  ASSESSMENT:  Clinically, I thought he had acute appendicitis and recommended that we proceed on with an acute laparoscopic appendectomy.  Patients urinalysis was unremarkable.  A plain abdominal or acute abdomen film was done that did not show a definite bowel obstruction.  There was one little dilated small bowel in the right lower quadrant like the distal ileum.  Patient will be started on Unasyn and we will proceed with a laparoscopic appendectomy. DD:  06/01/00 TD:  06/01/00 Job: [id number]

## 2010-08-15 NOTE — Discharge Summary (Signed)
[facility]  Patient:    Robert Ochoa, Robert Ochoa                          MRN: [id number] Adm. Date:  [id number] Disc. Date: [id number] Attending:  Henrene Dodge CC:         Doreatha Lew, M.D.   Discharge Summary  DISCHARGE DIAGNOSES:  Acute and chronic inflammatory reaction in the right lower quadrant from a previously ruptured retrocecal appendicitis with a fistula into the terminal ileum and marked retroperitoneal inflammatory reaction.  OPERATIONS: 1. Laparoscopic examination and then an open resection of inflammatory mass    in the terminal ileum and cecum with a primary anastomosis. 2. Approximately two weeks postoperatively he had a repeat exploratory    laparotomy and lysis of adhesions for obstruction secondary to adhesions    down close to the terminal ileal anastomotic area, but about three fourth    of the proximal.  HISTORY OF PRESENT ILLNESS:  Robert Ochoa is a 65 year old Caucasian male who presented to [place] on May 03, 2000, with about a two-week history of lower abdominal pain which he described as kind of low bloating, cramping, and pain in his back.  He had been seen at [facility] and had had an elevated white count.  Approximately two weeks earlier, they had contacted him about this and he stated that he was feeling better.  Then approximately eight or nine days later, he started having definite increase in pain and was reseen.  At that time, he was definitely more tender in the right lower quadrant, bloated, and still complaining, however, as some back discomfort, which he described really on both left and right back areas and was more acutely ill.  They called, thinking that this was acute appendicitis.  Being the surgeon on call, I saw him in the emergency room at [facility] and on examination I was definitely in agreement that he was definitely locally tender in the right lower quadrant with a lot  of muscle guarding and rebound.  He was not febrile.  His white count was 16,000 with a left shift and an acute abdominal series was unremarkable.  I recommended that we proceed on with a laparoscopic appendectomy.  A urinalysis was negative.  He was in agreement.  HOSPITAL COURSE:  The patient was taken on over to the operative suite with induction of general anesthesia.  After the abdomen was relaxed, you could feel a definite mass in the right lower quadrant that appeared to be a little higher than the typical McBurneys area.  After inserting the laparoscope, it was noted that he had a large inflammatory mass in the right lower quadrant. You could see the terminal ileum and the cecum.  The mass itself appeared to be more of a retroperitoneal with a lot of thick nodes in the mesentery and we converted him to an open procedure.  Timothy E. Earlene Plater, M.D., scrubbed in at that time.  Our first opinion when we were trying to free up this large mass that was definitely more of a retroperitoneal than a bowel problem, though that this was probably a lymphoma.  To excise these large matted nodes and this enormous thick, fibrous reaction, it was necessary to resect a short segment of the terminal ileum and cecum.  A handsewn anastomosis between the terminal ileum and cecum was performed after the mass was removed.  The pathologist was asked to come in and  reviewed this.  He feels that this is an inflammatory reaction.  You could not really visualize the appendix, but on examination after dissecting the area for the pathologist, it was noted that the appendix was in the wall of this big inflammatory area.  There was a fistula going into the terminal ileum that looks as if he has had a previous retrocecal appendicitis that has abscessed and then drained spontaneously back into the terminal ileum.  Postoperatively, he was continued on the Unasyn and we were quite relieved on permanent that this was  just a fibrous reaction related to a previous kind of smoldering inflammation.  At some places, this fibrous reaction was 1 inch thick and the mass itself was bigger than a kidney basin all total.  By his second postoperative day, the patient appeared to be doing fine.  We removed the nasogastric tube, kept him NPO, and he looked like he was doing satisfactory.  The following day, we started him on limited liquids.  However, about 24 hours after that, he started becoming bloated and urine output was decreased since he was shifting third space fluid.  Then the IV fluids were increased.  Acute abdominal x-rays now showed dilated small bowel and it was necessary to place an NG tube.  The patient was continued with the increased fluids and NG suction of a large amount of bilious drainage.  This occurred on a weekend and on the following Monday on examination, he was not really acutely tender, but he was obvious bloated and I thought it would be best to have the radiologist check the anastomosis to make sure that we did not have basically an obstruction at the anastomosis since it appeared to be kind of low down.  Ronney Asters, M.D., did the Gastrografin barium enema and could reflux the anastomosis about 4 feet.  This was all decompressed small bowel more proximal to the anastomosis.  We continued to continue the NG suction and ambulation, etc., with the hopes that he was going to open up after kind of a delayed postoperative ileus.  However, after four additional days of basically no improvement, I recommended to the patient that we proceed on with a repeat laparotomy since obviously he was not opening up.  He was not having any fever on elevated white count.  We corrected his fluid status with additional IV fluids, etc.  He was in agreement with this and was taken back to surgery on May 14, 2000, which was really 11 days after he was first admitted.  Thomas B. Samuella Cota, M.D., assisted and  what we found was really where he had had this large inflammatory mass with a loop of small bowel that was actually proximal to the anastomosis  basically had since scarred down where it was laying against this retroperitoneal inflammatory area was the actual point of obstruction.  This was freed up and repositioned.  The anastomosis had been checked preoperatively with the x-ray and on palpation and looking at the anastomosis at the time of surgery, it appeared to be unremarkable.  I did place a hyperalimentation catheter in him at the time of surgery and started him on TPN.  We kept him on NG suction this time until he started having bowel function, which was about the fourth postoperative day and the removed the NG tube, kept him 24 hours of NPO with no NG tube, and then started him on a diet, which he tolerated without problems.  I advanced him on up  to a full liquid diet, tapering off the TPN.  When he appeared to be hemodynamically stable and tolerating his food without any problems, he was ready to be released on May 23, 2000.  The patient is single and lives alone.  We wanted to be sure that he would be able to care for himself at the time of discharge.  We instructed him to see [place] in the office in approximately four or five days.  I advised him to gradually increase from the full liquids to basically a regular diet after 48 hours.  He denied any need of pain medication, except for Tylenol.  His incision was healing at the time of discharge.  His last laboratory studies showed a white count of 8800 and the hematocrit was 38.  His nutritional status appeared to be improving.  He has probably lost about 12 pounds during this hospitalization, but should regain that quickly.  In retrospect, the patient stated that probably about a year ago, he had an episode of lower abdominal pain.  He thought that he had the flu, but he said it took him about three or four weeks to kind of  shake it off.  I expect from the inflammation that we noted that this was the presenting episode of this retrocecal appendicitis that he had formed an abscess that spontaneously drained into the terminal ileum.  At the time of the pathology exam, it was official from the terminal ileum through the base of the appendix with a chronic abscess cavity in the center of this marked fibrosis and inflammatory mass. DD:  06/01/00 TD:  06/02/00 Job: [id number]

## 2010-08-15 NOTE — Op Note (Signed)
. [facility]  Patient:    Robert, Ochoa Visit Number: [id number] MRN: [id number]          Service Type: DSU Location: [facility] Attending Physician:  Henrene Dodge Dictated by:   Anselm Pancoast. Zachery Dakins, M.D. Proc. Date: 02/18/01 Admit Date:  02/18/2001 Discharge Date: 02/18/2001                             Operative Report  PREOPERATIVE DIAGNOSIS:  Hernia, possible spigelian hernia, possible incisional hernia.  POSTOPERATIVE DIAGNOSIS:  Probable incisional hernia, left lateral abdominal incision.  ANESTHESIA:  General.  SURGEON:  Anselm Pancoast. Zachery Dakins, M.D.  OPERATION:  Repair of abdominal wall hernia, left rectus, general anesthesia, with mesh.  HISTORY:  Robert Ochoa is a 65 year old Caucasian male who approximately a year ago started having lower abdominal symptoms, cramping, bloating, lost some weight, and then several months later had a worsening kind of recurrent symptoms, and I saw him, and thought he had acute appendicitis.  He was take to surgery and found to have a chronic inflammation with extensive retroperitoneal inflammation and appeared that he had had a chronic appendicitis with a fistula that had converted back into the cecum.  This area was excised, and postoperatively, he had problems with basically a high-grade partial bowel obstruction, and after approximately three weeks of hospitalization was re-explored with lysis of adhesions through the same small midline incision.  The patient did well and has had no further problems and has been followed in the office on several occasions to regain the weight that he had lost and return to work, and was doing just fine.  Then recently, he presented with a weakness to the left rectus area, not really as far laterally as I would have suspected a spigelian hernia, but it was certainly well away from the midline incision, and with straining, there was a significant bulge her and  I recommended that we repair this with mesh thinking that this is possibly a spigelian or possibly some type of incisional hernia, but I cannot identify any weakness of the midline fascia.  The patients laboratory studies are all normal.  Hematocrit is 44, white count 4.6, and chemistries are all normal.  DESCRIPTION OF PROCEDURE:  The patient was taken to the operative suite, given a gram of Kefzol, inducted in general anesthesia, and with the patient straining, I had marked an area preoperatively to male a transverse incision right over the weakness.  After he had been prepped in a sterile manner, incision with sharp dissection down through the skin and subcutaneous tissue identified this hernia sac.  It appeared that this is coming through the lateral third of the rectus not truly a spigelian hernia but certainly not a midline fascial defect either.  I dissected the hernia sac free circumferentially, and then close the hernia sac with 2-0 Vicryl, and then placed a small piece of mesh which was shaped sort of like a rectangular transversely, and the two proximal sutures medially were placed to straighten the needle to like a Keith needle, so it goes across the midline, and then these were tied.  The two lateral sutures kind of go over lateral to the rectus fascia on the lateral aspect, and then after these had been tied, the fascia was closed over this mesh incorporating the mesh in its midline with interrupted 0 Prolene sutures.  The subcutaneous tissue was closed with 3-0 chromic, and then the skin  was closed transversely with 5-0 nylon sutures. The patient tolerated the procedure nicely.  I did put Marcaine in the fascia, and we will release him after a short stay in the recovery room.  I am still not sure exactly what type of hernia this is.  I think that somehow or another probably the previous incision had weakened this lateral area whether with retraction or what, and this is  probably a variation of an incisional hernia instead of an actual true spigelian hernia.  I could not appreciate any other definite fascial or midline weaknesses when I had my finger in the peritoneal cavity feeling this. Dictated by:   Anselm Pancoast. Zachery Dakins, M.D. Attending Physician:  Henrene Dodge DD:  02/18/01 TD:  02/20/01 Job: [id number]

## 2010-08-15 NOTE — Assessment & Plan Note (Signed)
[facility]                                 ON-CALL NOTE   NAME:Peregrina, Johnatha                       MRN:          [id number]  DATE:03/09/2009                            DOB:          September 30, 1945    PROBLEM:  Mr. Meacham contacted me complaining of some sluggishness and  slight dizziness.  His beta-blocker was recently increased to a total of  25 mg b.i.d., following interrogation of his ICD just last week.  Of  note, I do not have any available records by computer review.  The  patient is still taking his medications, as instructed, and denies any  frank syncope.  Of note, he did not experience any palpitations, prior  to the interrogation, which prompted Dr. Johney Frame to increase the beta-  blocker.   PLAN:  I advised Mr. Merlos to check his blood pressure and pulse prior  to taking his medication.  I placed a parameter of 100 systolic for his  blood pressure, under which he is to take only half his beta-blocker  dose.  He is to call me back directly tomorrow to give me an update and  for further recommendations.     Gene Serpe, PA-C  Electronically Signed    GS/MedQ  DD: 03/09/2009  DT: 03/10/2009  Job #: [id number]

## 2010-08-15 NOTE — Op Note (Signed)
[facility]  Patient:    Robert Ochoa, Robert Ochoa                          MRN: [id number] Proc. Date: 05/03/00 Adm. Date:  [id number] Attending:  Henrene Dodge CC:         Doreatha Lew, M.D.   Operative Report  PREOPERATIVE DIAGNOSES:  Acute appendicitis.  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 Await final path.  OPERATION PERFORMED:  Resection of terminal ileum large area of mesentery and cecum with ileocecal anastomosis or ileo-ascending colon anastomosis.  ANESTHESIA:  General.  SURGEON:  Dr. Consuello Bossier.  ASSISTANT:  Dr. Kendrick Ranch starting off on the laparoscopic procedure.  HISTORY OF PRESENT ILLNESS:  Robert Ochoa is a 65 year old Caucasian male who was referred to [place] by Dr. Raenette Rover from the [facility] today after he re-presented with abdominal pain that the patient described started approximately 12 days earlier. When he first started having this pain, the pain was not very well localized. He was having some pain in the left back, right back. After about 24 hours, it resolved. The family practice doctor who did lab studies which showed a normal urinalysis and white count of 13,900 with a mild left shift, and the patient was contacted the following day and said his symptoms were better and was not seen because he was improving. Over the weekend, he has had two episodes of increasing pain now more in the lower abdomen predominantly on the right and he was reseen in their office today. On examination, he was definitely tender in the lower abdomen. His white count today was about 15,900 and he was thought probably to have acute appendicitis. The patient states he has lost about 8 or 9 pounds during this two week period and he has had a lot of family social problems of marital problems which he was contributing to part of his abdominal  discomfort. With his repeat exam showing largely right lower quadrant pain, he was sent for me to examine him. On examination, the patient was definitely tender in the right lower quadrant with muscle guarding and rebound. His abdomen was not really distended and repeat white count was 16,000, his hematocrit is normal. Urinalysis was unremarkable. The plain abdominal film was essentially unremarkable with maybe a little bit of dilated loop in the small bowel like a little ileus. I recommended since it was obtunded that we proceed on, but discussed with him that I do a laparoscopic exam and hopefully we could do a laparoscopic appendectomy.  DESCRIPTION OF PROCEDURE:  The patient was taken to the operative suite, induction of general anesthesia, endotracheal tube. He was given 3 gm of Unasyn immediately preoperatively and then the abdomen was shaved below the umbilicus ______ the right lower quadrant and also at the port site areas. A small incision was made below the umbilicus really ______ the prep and you could feel a mass that was about the size of a large potato kind of on the right lower quadrant and this was an area where he had localized his pain previously but preoperatively he was just muscle guarding so much that you could not actually feel a definite mass. The mass itself was not fixed. You could wiggle it around a little bit. The patient was draped in a sterile manner, a Foley catheter inserted sterilely and then upon the opening to the peritoneal cavity, the  Hasson cannula held in place and carbon dioxide infused. With looking and feeling at the area, you could see this big inflammatory mass that appeared to be at the mesentery of the terminal ileum and the cecum and you could see the cecum sort of on the outside area and retroperitoneally try to open the area up. I could not see anything that looked like the appendix but obviously this was a large area and something that  could not be handled with a laparoscopic approach. Therefore, I went ahead and removed the Hasson cannula and then extended the incision below the umbilicus and curving up to the right above the umbilicus.  Upon opening into the true peritoneal cavity, you could then put your hand in the area and this was a big mass about the size of a large baking potato and you could kind of rock it back and forth. There was what appeared to be acute inflammatory reactions within the mesentery but in opening up the retroperitoneal area, you could sort see an inflammatory appeal but there is not anything that you can actually see that can be identified as the actual appendix. I continued to open up the retroperitoneal area trying to identify the right ureter peeling this off and we then decided that Dr. Earlene Plater had scrubbed in at this time and it would be necessary to go ahead and excise the distal probably 6-8 inches of the terminal ileum and the cecum even though the cecum and small bowel really were not acutely inflamed like a tumor or like Crohns disease. This mass itself was predominantly open mesentery and really grossly looked more like a lymphoma with the exception that he had obviously had some acute inflammatory response ______ reaction. I divided the distal small bowel with the GIA, divided the mesentery with Kellys and these were tied with 2-0 silks. I then went on over to the cecum so that we actually moved probably about 4 inches of the terminal cecum and then actually transected the cecum with a TA-60 with the 4.8 staples on the stay side and a Kocher on the go side. Upon opening up the specimen in the cecal area, you could see purulence coming from the orifice that used to be the appendix and I sent the area on over for the pathologist to examine. He further opened the specimen up and said he could not see anything that was recognizable as an acute appendix, but there was pus coming from this  opening that supposedly was the appendix and that he thinks that this is acute inflammatory reaction. He did go ahead and do some frozen  sections of some of these enlarged lymph nodes and feel that it is more likely inflammatory instead of lymphoma. We were in agreement with this and we went ahead and did a single layer silk anastomosis of the terminal ileum to a portion of the suture line. The sutures of 3-0 silks not converted and at the completion there was a good two finger opening. The little mesenteric defect was closed with 3-0 silk. The peritoneum was thoroughly irrigated. Sponge count was correct and the intestine where the terminal ileum goes into the cecum is lined without tension. I used a Satinsky on the cecum to prevent fecal contamination and intestine clamped proximally removing these where the posterior layer of the sutures were replaced. There was not hardly any spillage of intestinal contents. The small bowel was placed back in anatomic position. The omentum is placed down over this  anastomosis. The irrigated fluid was aspirated and then we closed the about 6 inch midline incision with interrupted sutures of #1 PDS with a few interspersed #0 Prolene. The skin was closed with staples. We used PCA morphine for postop pain control and will keep him on antibiotics for at least 48 hours and await the final pathology report. I discussed with both the wife and daughter that we think that this is going to be an inflammatory process but wait for the final path. DD:  05/03/00 TD:  05/05/00 Job: [id number]

## 2010-08-15 NOTE — Op Note (Signed)
[facility]  Patient:    Robert Ochoa, Robert Ochoa                          MRN: [id number] Proc. Date: 05/14/00 Adm. 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  POSTOPERATIVE DIAGNOSIS:  Small-bowel obstruction secondary to adhesions in previous operative area.  OPERATION:  Exploratory laparotomy and lysis of adhesions and also placement of subclavian catheter.  HISTORY:  Robert Ochoa is a 65 year old Caucasian male, who was admitted through the emergency room on May 03, 2000, when he presented with 24-48 hours of progressive, severe lower abdominal pain.  He had had a previous episode about 10 days earlier with an elevated white count and on physical examination, was definitely tender in the lower abdomen.  At the time of surgery, after induction of anesthesia, he was noted to have a large mass in the right lower quadrant.  Laparoscopic exam showed that this looked like a big retroperitoneal mass.  We then converted to an open procedure and showed a big, enlarged lymph nodes that preoperatively we thought was a lymphoma that turned out to be a chronic retrocecal appendix with a fistula and passed in the terminal ileum.  The area was excised, and an ileal ascending colon end-to-end anastomosis was performed and on the first few days after surgery, he appeared to be doing satisfactory.  The NG tube was removed, but then approximately six days after surgery, he became very distended, nauseated, and x-rays then showed that he had some findings of a bowel obstruction.  He has had a NG tube.  Intravenous fluids have been adjusted.  On Monday, I did a Gastrografin enema that showed the anastomosis in the terminal ileum was fine, and he has had  conservative management with NG suction, etc., but no real improvement.  The dilated loops are still present.  Yesterday I gave him some barium from above and on the x-rays this morning, there was still just basically barium within the dilated proximal loops.  No barium in the colon, and I think that he definitely has a small-bowel obstruction.  I recommended that we proceed on with exploratory laparotomy.  His white count has been normal.  He has not had fever, and we also decided that we would put a subclavian catheter and start him on TPN.  DESCRIPTION OF PROCEDURE:  The patient preoperatively was taken to surgery. After induction of general anesthesia, we removed his NG tube from the left and placed it into the right nostril and then prepped his abdomen and draped him in a sterile manner.  The skin staples were removed.  The skin portion of the incision was opened up.  The sutures were removed in the fascia and upon opening to the peritoneum, there were several little loops of bowel kind of stuck up into the midline incision, but there was not any point of obstruction at this point, and there was no evidence of any active infection.  In the loop of bowel, probably about 3-4 feet prior to the ileocecal anastomosis, however, it was adherent to the area where he had had this big retroperitoneal fibrosis area, not really a true  internal hernia, but this was the actual point of obstruction just where the bowel had sucked in or whatever.  This was freed up so that the bowel now was free from the ligament of Treitz to the terminal ileum or the anastomosis area, and a few little serosal tears were sutured with 3-0 Vicryl, but there was never any time that we were actually within the intestine itself.  There was no evidence of any infection or abscess.  It was noted that the patient really does not have very much omentum, and the little bit of omentum that he did have was kind of over the  anastomosis but not really under the undersurface of the lower midline incision.  I then pressed the sulcus entericus back up so we could remove it from the NG tube and about 800-900 cc of small bowel contents removed.  The NG was in good position.  The bowel was placed back in the anatomical position.  We irrigated him out thoroughly with warm saline.  No evidence of any bleeding.  Pulled the omentum as best we could down over the small bowel and then closed the same incision, as it had not been extended any further, with interrupted sutures of 0 Prolene.  The skin itself was closed with staples.  At the completion of this, I placed him back in Trendelenburg position and put a subclavian in the left subclavian area so that we can give him hyperalimentation which has been ordered.  The patient tolerated the procedure nicely.  A Foley catheter was inserted, and we will have him on PCA morphine postoperatively for pain control.  I am planning to keep him on antibiotics for probably about 48 hours, and then I am also planning to definitely keep his NG tube until he starts having definite bowel movements and not removing early, as I did last time.  Sponge, needle and instrument counts were correct.  The estimated blood loss was minimal. DD:  05/14/00 TD:  05/15/00 Job: [id number]

## 2010-09-01 ENCOUNTER — Encounter: Payer: Self-pay | Admitting: Internal Medicine

## 2010-09-22 ENCOUNTER — Ambulatory Visit (INDEPENDENT_AMBULATORY_CARE_PROVIDER_SITE_OTHER): Payer: Medicare Other | Admitting: Internal Medicine

## 2010-09-22 ENCOUNTER — Encounter: Payer: Self-pay | Admitting: Internal Medicine

## 2010-09-22 VITALS — BP 137/90 | HR 49 | Resp 14 | Ht 74.0 in | Wt 224.0 lb

## 2010-09-22 DIAGNOSIS — I4891 Unspecified atrial fibrillation: Secondary | ICD-10-CM

## 2010-09-22 DIAGNOSIS — I472 Ventricular tachycardia: Secondary | ICD-10-CM

## 2010-09-22 DIAGNOSIS — Q249 Congenital malformation of heart, unspecified: Secondary | ICD-10-CM

## 2010-09-22 NOTE — Progress Notes (Signed)
The patient presents today for routine electrophysiology followup.  Since last being seen in our clinic, the patient reports doing very well.  Today, he denies symptoms of palpitations, chest pain, shortness of breath, orthopnea, PND, lower extremity edema, dizziness, presyncope, syncope, or neurologic sequela.  The patient feels that he is tolerating medications without difficulties and is otherwise without complaint today.   Past Medical History  Diagnosis Date  . Ventricular tachycardia   . Arrhythmogenic right ventricular dysplasia   . Atrial fibrillation or flutter    Past Surgical History  Procedure Date  . Appendectomy   . Cardiac defibrillator placement 02/19/09     St,. Jude     Current Outpatient Prescriptions  Medication Sig Dispense Refill  . Calcium-Magnesium-Zinc (CAL-MAG-ZINC PO) 1 tab po qd       . metoprolol (LOPRESSOR) 50 MG tablet 1 1/2 tabs tab po bid      . Omega-3 Fatty Acids (FISH OIL) 1000 MG CAPS Take by mouth daily.        . pioglitazone (ACTOS) 15 MG tablet Take 15 mg by mouth daily.        . vitamin C (ASCORBIC ACID) 500 MG tablet Take 500 mg by mouth daily.        . vitamin E 400 UNIT capsule Take 400 Units by mouth daily.        [name] DISCONTD: CALCIUM-MAGNESUIUM-ZINC 333-133-8.3 MG TABS Take by mouth daily.        [name] DISCONTD: lisinopril (PRINIVIL,ZESTRIL) 5 MG tablet Take 5 mg by mouth daily.        [name] DISCONTD: meclizine (ANTIVERT) 12.5 MG tablet Take 12.5 mg by mouth as needed.        [name] DISCONTD: meloxicam (MOBIC) 7.5 MG tablet Take 7.5 mg by mouth daily.        [name] DISCONTD: moxifloxacin (VIGAMOX) 0.5 % ophthalmic solution 1 drop as directed.        [name] DISCONTD: Multiple Vitamins-Minerals (CENTRUM SILVER ULTRA MENS) TABS Take by mouth daily.        [name] DISCONTD: Multiple Vitamins-Minerals (OCUVITE PRESERVISION) TABS Take by mouth daily.        [name] DISCONTD: Polyethyl Glycol-Propyl Glycol (SYSTANE) 0.4-0.3 % SOLN Apply to eye as directed.          No Known  Allergies  History   Social History  . Marital Status: Divorced    Spouse Name: N/A    Number of Children: N/A  . Years of Education: N/A   Occupational History  . Not on file.   Social History Main Topics  . Smoking status: Never Smoker   . Smokeless tobacco: Not on file  . Alcohol Use: Yes     social  . Drug Use: No  . Sexually Active: Not on file   Other Topics Concern  . Not on file   Social History Narrative   Divorced w 1 child. Self employed.     Family History  Problem Relation Age of Onset  . Heart attack Father     ROS-  All systems are reviewed and are negative except as outlined in the HPI above    Physical Exam: Filed Vitals:   09/22/10 1017  BP: 137/90  Pulse: 49  Resp: 14  Height: 6\' 2"  (1.88 m)  Weight: 224 lb (101.606 kg)    GEN- The patient is well appearing, alert and oriented x 3 today.   Head- normocephalic, atraumatic Eyes-  Sclera clear, conjunctiva pink Ears- hearing intact  Oropharynx- clear Neck- supple, no JVP Lymph- no cervical lymphadenopathy Lungs- Clear to ausculation bilaterally, normal work of breathing Chest- ICD pocket is well healed Heart- Regular rate and rhythm, no murmurs, rubs or gallops, PMI not laterally displaced GI- soft, NT, ND, + BS Extremities- no clubbing, cyanosis, or edema MS- no significant deformity or atrophy Skin- no rash or lesion Psych- euthymic mood, full affect Neuro- strength and sensation are intact  ICD interrogation- reviewed in detail today,  See PACEART report  Assessment and Plan:

## 2010-09-22 NOTE — Assessment & Plan Note (Signed)
Doing well. Normal ICD function See Arita Miss Art report No changes today  He had one episode of ATP terminated VT 07/30/10  CL 330 ms  No changes today

## 2010-09-22 NOTE — Assessment & Plan Note (Signed)
Stable No change required today  

## 2010-09-22 NOTE — Assessment & Plan Note (Signed)
No further episodes Continue ASA.

## 2010-09-22 NOTE — Patient Instructions (Signed)
Your physician recommends that you schedule a follow-up appointment in: 3 months with device clinic and 12 months Dr Johney Frame   Your physician recommends that you continue on your current medications as directed. Please refer to the Current Medication list given to you today.

## 2010-10-08 ENCOUNTER — Telehealth: Payer: Self-pay | Admitting: Internal Medicine

## 2010-10-08 MED ORDER — METOPROLOL TARTRATE 25 MG PO TABS: 75.0000 mg | ORAL_TABLET | Freq: Two times a day (BID) | ORAL | Status: DC

## 2010-10-08 NOTE — Telephone Encounter (Signed)
Pt called and said Phar had faxed pres for Metoprolol 2 days ago with no reply.  Pt will be out tonight and needs this called in.  He wants 25 mg tablets to simplify things.  [facility].

## 2010-10-09 ENCOUNTER — Other Ambulatory Visit: Payer: Self-pay | Admitting: *Deleted

## 2010-10-09 MED ORDER — METOPROLOL TARTRATE 25 MG PO TABS: 75.0000 mg | ORAL_TABLET | Freq: Two times a day (BID) | ORAL | Status: DC

## 2010-12-24 ENCOUNTER — Ambulatory Visit (INDEPENDENT_AMBULATORY_CARE_PROVIDER_SITE_OTHER): Payer: Medicare Other | Admitting: *Deleted

## 2010-12-24 ENCOUNTER — Encounter: Payer: Self-pay | Admitting: Internal Medicine

## 2010-12-24 DIAGNOSIS — I472 Ventricular tachycardia: Secondary | ICD-10-CM

## 2010-12-24 DIAGNOSIS — R55 Syncope and collapse: Secondary | ICD-10-CM

## 2010-12-24 LAB — ICD DEVICE OBSERVATION
AL THRESHOLD: 1 V
ATRIAL PACING ICD: 1.8 pct
BAMS-0001: 170 {beats}/min
DEV-0020ICD: NEGATIVE
DEVICE MODEL ICD: 746606
HV IMPEDENCE: 53 Ohm
MODE SWITCH EPISODES: 0
PACEART VT: 0
RV LEAD THRESHOLD: 0.75 V
TOT-0006: 20101123000000
TOT-0007: 4
TZAT-0004SLOWVT: 8
TZAT-0012SLOWVT: 200 ms
TZAT-0013SLOWVT: 3
TZAT-0018SLOWVT: NEGATIVE
TZAT-0019SLOWVT: 7.5 V
TZON-0003SLOWVT: 350 ms
TZON-0004SLOWVT: 16
TZON-0010SLOWVT: 80 ms
TZST-0001SLOWVT: 3
VENTRICULAR PACING ICD: 4.1 pct

## 2010-12-24 NOTE — Progress Notes (Signed)
ICD check with ICM 

## 2011-03-19 ENCOUNTER — Ambulatory Visit (INDEPENDENT_AMBULATORY_CARE_PROVIDER_SITE_OTHER): Payer: Medicare Other | Admitting: *Deleted

## 2011-03-19 ENCOUNTER — Encounter: Payer: Self-pay | Admitting: Internal Medicine

## 2011-03-19 DIAGNOSIS — I472 Ventricular tachycardia: Secondary | ICD-10-CM

## 2011-03-19 LAB — ICD DEVICE OBSERVATION
AL AMPLITUDE: 3 mv
AL IMPEDENCE ICD: 387.5 Ohm
BAMS-0001: 170 {beats}/min
HV IMPEDENCE: 48 Ohm
RV LEAD IMPEDENCE ICD: 362.5 Ohm
TOT-0007: 4
TOT-0008: 0
TZAT-0001SLOWVT: 1
TZAT-0012SLOWVT: 200 ms
TZAT-0013SLOWVT: 3
TZAT-0019SLOWVT: 7.5 V
TZAT-0020SLOWVT: 1 ms
TZON-0004SLOWVT: 16
TZON-0005SLOWVT: 6
TZST-0001SLOWVT: 4
TZST-0003SLOWVT: 30 J
TZST-0003SLOWVT: 40 J
VENTRICULAR PACING ICD: 3.2 pct
VF: 1

## 2011-04-13 DIAGNOSIS — K5730 Diverticulosis of large intestine without perforation or abscess without bleeding: Secondary | ICD-10-CM | POA: Diagnosis not present

## 2011-04-13 DIAGNOSIS — Z1211 Encounter for screening for malignant neoplasm of colon: Secondary | ICD-10-CM | POA: Diagnosis not present

## 2011-05-04 DIAGNOSIS — H2510 Age-related nuclear cataract, unspecified eye: Secondary | ICD-10-CM | POA: Diagnosis not present

## 2011-05-07 ENCOUNTER — Other Ambulatory Visit: Payer: Self-pay

## 2011-05-07 ENCOUNTER — Emergency Department (HOSPITAL_COMMUNITY): Payer: Medicare Other

## 2011-05-07 ENCOUNTER — Encounter (HOSPITAL_COMMUNITY): Payer: Self-pay | Admitting: Emergency Medicine

## 2011-05-07 ENCOUNTER — Inpatient Hospital Stay (HOSPITAL_COMMUNITY)
Admission: EM | Admit: 2011-05-07 | Discharge: 2011-05-11 | DRG: 309 | Disposition: A | Discharge status: Home / Self Care | Payer: Medicare Other | Attending: Internal Medicine | Admitting: Internal Medicine

## 2011-05-07 DIAGNOSIS — I4891 Unspecified atrial fibrillation: Secondary | ICD-10-CM

## 2011-05-07 DIAGNOSIS — I4892 Unspecified atrial flutter: Secondary | ICD-10-CM | POA: Diagnosis present

## 2011-05-07 DIAGNOSIS — Z7901 Long term (current) use of anticoagulants: Secondary | ICD-10-CM

## 2011-05-07 DIAGNOSIS — R000 Tachycardia, unspecified: Secondary | ICD-10-CM | POA: Diagnosis not present

## 2011-05-07 DIAGNOSIS — I428 Other cardiomyopathies: Secondary | ICD-10-CM | POA: Diagnosis present

## 2011-05-07 DIAGNOSIS — I4729 Other ventricular tachycardia: Principal | ICD-10-CM | POA: Diagnosis present

## 2011-05-07 DIAGNOSIS — R4789 Other speech disturbances: Secondary | ICD-10-CM | POA: Diagnosis not present

## 2011-05-07 DIAGNOSIS — I472 Ventricular tachycardia, unspecified: Principal | ICD-10-CM | POA: Diagnosis present

## 2011-05-07 DIAGNOSIS — Z95810 Presence of automatic (implantable) cardiac defibrillator: Secondary | ICD-10-CM

## 2011-05-07 DIAGNOSIS — Z79899 Other long term (current) drug therapy: Secondary | ICD-10-CM

## 2011-05-07 DIAGNOSIS — R6889 Other general symptoms and signs: Secondary | ICD-10-CM | POA: Diagnosis not present

## 2011-05-07 HISTORY — DX: Presence of automatic (implantable) cardiac defibrillator: Z95.810

## 2011-05-07 LAB — DIFFERENTIAL
Basophils Relative: 0 % (ref 0–1)
Eosinophils Absolute: 0.1 10*3/uL (ref 0.0–0.7)
Monocytes Absolute: 0.6 10*3/uL (ref 0.1–1.0)
Monocytes Relative: 10 % (ref 3–12)

## 2011-05-07 LAB — MAGNESIUM: Magnesium: 2.1 mg/dL (ref 1.5–2.5)

## 2011-05-07 LAB — BASIC METABOLIC PANEL
BUN: 22 mg/dL (ref 6–23)
Creatinine, Ser: 1.03 mg/dL (ref 0.50–1.35)
GFR calc Af Amer: 85 mL/min — ABNORMAL LOW (ref >90)
GFR calc non Af Amer: 74 mL/min — ABNORMAL LOW (ref >90)

## 2011-05-07 LAB — CBC
HCT: 47.5 % (ref 39.0–52.0)
Hemoglobin: 15.9 g/dL (ref 13.0–17.0)
MCH: 28.4 pg (ref 26.0–34.0)
MCHC: 33.5 g/dL (ref 30.0–36.0)

## 2011-05-07 MED ORDER — DEXTROSE 5 % IV SOLN
60.0000 mg/h | INTRAVENOUS | Status: DC
  Filled 2011-05-07: qty 9

## 2011-05-07 MED ORDER — AMIODARONE HCL IN DEXTROSE 360-4.14 MG/200ML-% IV SOLN
60.0000 mg/h | INTRAVENOUS | Status: DC
  Administered 2011-05-07: 60 mg/h via INTRAVENOUS
  Administered 2011-05-08: 04:00:00 via INTRAVENOUS
  Administered 2011-05-08: 60 mg/h via INTRAVENOUS
  Administered 2011-05-08 – 2011-05-09 (×2): 30 mg/h via INTRAVENOUS
  Filled 2011-05-07 (×12): qty 200

## 2011-05-07 NOTE — ED Notes (Signed)
PT'S DEFIBRILLATOR CURRENTLY BEING INTERROGATED BY TECHNICIAN.

## 2011-05-07 NOTE — ED Notes (Signed)
[employer] Jude called for device interrogation.

## 2011-05-07 NOTE — ED Notes (Signed)
St. Jude Rep, Arlys John called back and will be here in about one hour.  He is on a call in [place].

## 2011-05-07 NOTE — ED Provider Notes (Signed)
History     CSN: [id number]  Arrival date & time 05/07/11  2125   First MD Initiated Contact with Patient 05/07/11 2129      Chief Complaint  Patient presents with  . Tachycardia    (Consider location/radiation/quality/duration/timing/severity/associated sxs/prior treatment) HPI Comments: Patient arrives via EMS from home with his defibrillator firing 8 times in the past hour.  He has a history of arrhythmogenic right ventricular dysplasia.  He denies any chest pain, shortness of breath, nausea, vomiting, fever or chills. He was started on amiodarone by EMS for ventricular tachycardia. On arrival his EKG shows atrial fibrillation is asymptomatic.  The history is provided by the patient.    Past Medical History  Diagnosis Date  . Ventricular tachycardia   . Arrhythmogenic right ventricular dysplasia   . Atrial fibrillation or flutter   . Pacemaker   . Cardiac defibrillator in place     Past Surgical History  Procedure Date  . Appendectomy   . Cardiac defibrillator placement 02/19/09     St,. Jude   . Pacemaker insertion   . Cardiac defibrillator placement     Family History  Problem Relation Age of Onset  . Heart attack Father     History  Substance Use Topics  . Smoking status: Never Smoker   . Smokeless tobacco: Not on file  . Alcohol Use: Yes     social      Review of Systems  Constitutional: Negative for fever, activity change and appetite change.  HENT: Negative for congestion and rhinorrhea.   Eyes: Negative for visual disturbance.  Respiratory: Negative for cough, chest tightness and shortness of breath.   Cardiovascular: Positive for palpitations. Negative for chest pain.  Gastrointestinal: Negative for nausea, vomiting and abdominal pain.  Genitourinary: Negative for dysuria and hematuria.  Musculoskeletal: Negative for back pain.  Skin: Negative for rash.  Neurological: Negative for headaches.    Allergies  Review of patient's allergies  indicates no known allergies.  Home Medications   No current outpatient prescriptions on file.  BP 125/80  Pulse 57  Temp(Src) 98.4 F (36.9 C) (Oral)  Resp 12  Ht 6\' 2"  (1.88 m)  Wt 220 lb 0.3 oz (99.8 kg)  BMI 28.25 kg/m2  SpO2 96%  Physical Exam  Constitutional: He is oriented to person, place, and time. He appears well-developed and well-nourished. No distress.  HENT:  Head: Normocephalic and atraumatic.  Mouth/Throat: Oropharynx is clear and moist. No oropharyngeal exudate.  Eyes: Conjunctivae are normal. Pupils are equal, round, and reactive to light.  Neck: Normal range of motion. Neck supple.  Cardiovascular: Normal rate and normal heart sounds.   No murmur heard.      Irregularly irregular  Pulmonary/Chest: Effort normal and breath sounds normal. No respiratory distress.  Abdominal: Soft. There is no tenderness. There is no rebound and no guarding.  Musculoskeletal: Normal range of motion. He exhibits no edema and no tenderness.  Neurological: He is alert and oriented to person, place, and time. No cranial nerve deficit.    ED Course  Procedures (including critical care time)  Labs Reviewed  BASIC METABOLIC PANEL - Abnormal; Notable for the following:    Glucose, Bld 179 (*)    GFR calc non Af Amer 74 (*)    GFR calc Af Amer 85 (*)    All other components within normal limits  CARDIAC PANEL(CRET KIN+CKTOT+MB+TROPI) - Abnormal; Notable for the following:    Total CK 506 (*)    CK, MB  13.7 (*)    Troponin I 1.62 (*)    Relative Index 2.7 (*)    All other components within normal limits  CARDIAC PANEL(CRET KIN+CKTOT+MB+TROPI) - Abnormal; Notable for the following:    Total CK 418 (*)    CK, MB 12.5 (*) CRITICAL VALUE NOTED.  VALUE IS CONSISTENT WITH PREVIOUSLY REPORTED AND CALLED VALUE.   Troponin I 3.56 (*)    Relative Index 3.0 (*)    All other components within normal limits  CBC  DIFFERENTIAL  PROTIME-INR  MAGNESIUM  MRSA PCR SCREENING  CARDIAC  PANEL(CRET KIN+CKTOT+MB+TROPI)  CARDIAC PANEL(CRET KIN+CKTOT+MB+TROPI)  TSH  BRAIN NATRIURETIC PEPTIDE  BASIC METABOLIC PANEL  HEPARIN LEVEL (UNFRACTIONATED)  BASIC METABOLIC PANEL   Dg Chest Portable 1 View  05/07/2011  *RADIOLOGY REPORT*  Clinical Data: 67 year old male with tachycardia, defibrillator fired.  PORTABLE CHEST - 1 VIEW  Comparison: 02/20/2009.  Findings: AP portable semi upright view at 2142 hours.  Lower lung volumes.  Stable cardiac size and mediastinal contours.  Left chest cardiac AICD again noted.  No pneumothorax, pulmonary edema or pleural effusion.  Streaky retrocardiac opacity most resembles atelectasis.  IMPRESSION: Low lung volumes, otherwise no acute cardiopulmonary abnormality.  Original Report Authenticated By: Harley Hallmark, M.D.     1. Ventricular tachycardia   2. Atrial fibrillation   3. Right ventricular dysplasia       MDM  Defibrillator firing for ventricular tachycardia.  Hx of ARVD.  Vitals stable, Afib on monitor, BP stable.  Asymptomatic.   Will obtain chemistries, cardiac enzymes, interrogate the AICD. Amiodarone drip continued.  Case discussed with Dr. Shirlee Latch in cardiology. Patient has received appropriate shocks for ventricular tachycardia tonight x9. He's had multiple episodes of tachycardia that would require overdrive pacing.   Date: 05/07/2011  Rate: 114  Rhythm: atrial fibrillation  QRS Axis: normal  Intervals: normal  ST/T Wave abnormalities: nonspecific ST/T changes  Conduction Disutrbances:left anterior fascicular block  Narrative Interpretation: Now in atrial fibrillation  Old EKG Reviewed: changes noted  CRITICAL CARE Performed by: Glynn Octave   Total critical care time: 30  Critical care time was exclusive of separately billable procedures and treating other patients.  Critical care was necessary to treat or prevent imminent or life-threatening deterioration.  Critical care was time spent personally by me on the  following activities: development of treatment plan with patient and/or surrogate as well as nursing, discussions with consultants, evaluation of patient's response to treatment, examination of patient, obtaining history from patient or surrogate, ordering and performing treatments and interventions, ordering and review of laboratory studies, ordering and review of radiographic studies, pulse oximetry and re-evaluation of patient's condition.        Glynn Octave, MD 05/08/11 [phone]

## 2011-05-07 NOTE — ED Notes (Signed)
PT. ARRIVED WITH EMS FROM HOME , REPORTS DEFIBRILLATOR FIRED 8 TIMES THIS EVENING WITH RUNS OF V-TACH / A-FIB, RECEIVED AMIODARONE 150 MG BY EMS PTA , DENIES CHEST PAIN OR SOB.

## 2011-05-08 ENCOUNTER — Encounter (HOSPITAL_COMMUNITY): Payer: Self-pay | Admitting: *Deleted

## 2011-05-08 ENCOUNTER — Other Ambulatory Visit: Payer: Self-pay

## 2011-05-08 DIAGNOSIS — I517 Cardiomegaly: Secondary | ICD-10-CM | POA: Diagnosis not present

## 2011-05-08 DIAGNOSIS — I472 Ventricular tachycardia, unspecified: Secondary | ICD-10-CM | POA: Diagnosis not present

## 2011-05-08 DIAGNOSIS — I4891 Unspecified atrial fibrillation: Secondary | ICD-10-CM

## 2011-05-08 DIAGNOSIS — Z79899 Other long term (current) drug therapy: Secondary | ICD-10-CM | POA: Diagnosis not present

## 2011-05-08 DIAGNOSIS — I4729 Other ventricular tachycardia: Secondary | ICD-10-CM | POA: Diagnosis not present

## 2011-05-08 DIAGNOSIS — Z95810 Presence of automatic (implantable) cardiac defibrillator: Secondary | ICD-10-CM | POA: Diagnosis not present

## 2011-05-08 DIAGNOSIS — I428 Other cardiomyopathies: Secondary | ICD-10-CM | POA: Diagnosis present

## 2011-05-08 DIAGNOSIS — Q249 Congenital malformation of heart, unspecified: Secondary | ICD-10-CM | POA: Diagnosis not present

## 2011-05-08 DIAGNOSIS — Z7901 Long term (current) use of anticoagulants: Secondary | ICD-10-CM | POA: Diagnosis not present

## 2011-05-08 DIAGNOSIS — I4892 Unspecified atrial flutter: Secondary | ICD-10-CM | POA: Diagnosis present

## 2011-05-08 LAB — CARDIAC PANEL(CRET KIN+CKTOT+MB+TROPI)
Relative Index: 3 — ABNORMAL HIGH (ref 0.0–2.5)
Relative Index: 2.6 — ABNORMAL HIGH (ref 0.0–2.5)
Relative Index: 2.3 (ref 0.0–2.5)
Total CK: 418 U/L — ABNORMAL HIGH (ref 7–232)
Total CK: 335 U/L — ABNORMAL HIGH (ref 7–232)
Troponin I: 0.58 ng/mL (ref <0.30)

## 2011-05-08 LAB — MRSA PCR SCREENING: MRSA by PCR: NEGATIVE

## 2011-05-08 LAB — BASIC METABOLIC PANEL
BUN: 15 mg/dL (ref 6–23)
BUN: 16 mg/dL (ref 6–23)
Creatinine, Ser: 0.95 mg/dL (ref 0.50–1.35)
Creatinine, Ser: 1.01 mg/dL (ref 0.50–1.35)
GFR calc Af Amer: >90 mL/min (ref >90)
GFR calc Af Amer: 87 mL/min — ABNORMAL LOW (ref >90)
GFR calc non Af Amer: 85 mL/min — ABNORMAL LOW (ref >90)
GFR calc non Af Amer: 75 mL/min — ABNORMAL LOW (ref >90)

## 2011-05-08 MED ORDER — AMIODARONE HCL IN DEXTROSE 360-4.14 MG/200ML-% IV SOLN
INTRAVENOUS | Status: AC
  Filled 2011-05-08: qty 200

## 2011-05-08 MED ORDER — SODIUM CHLORIDE 0.9 % IJ SOLN: 3.0000 mL | INTRAMUSCULAR | Status: DC | PRN

## 2011-05-08 MED ORDER — ACETAMINOPHEN 325 MG PO TABS
650.0000 mg | ORAL_TABLET | ORAL | Status: DC | PRN
Start: 2011-05-08 — End: 2011-05-11

## 2011-05-08 MED ORDER — DEXTROSE 5 % IV SOLN
150.0000 mg | Freq: Once | INTRAVENOUS | Status: AC
  Administered 2011-05-08: 150 mg via INTRAVENOUS

## 2011-05-08 MED ORDER — SODIUM CHLORIDE 0.9 % IJ SOLN
3.0000 mL | Freq: Two times a day (BID) | INTRAMUSCULAR | Status: DC
  Administered 2011-05-08 – 2011-05-10 (×4): 3 mL via INTRAVENOUS

## 2011-05-08 MED ORDER — SODIUM CHLORIDE 0.9 % IV SOLN
250.0000 mL | INTRAVENOUS | Status: DC | PRN
  Administered 2011-05-08: 10 mL/h via INTRAVENOUS

## 2011-05-08 MED ORDER — ASPIRIN EC 81 MG PO TBEC
81.0000 mg | DELAYED_RELEASE_TABLET | Freq: Every day | ORAL | Status: DC
  Administered 2011-05-09: 81 mg via ORAL
  Filled 2011-05-08: qty 1

## 2011-05-08 MED ORDER — ALPRAZOLAM 0.5 MG PO TABS
0.5000 mg | ORAL_TABLET | Freq: Three times a day (TID) | ORAL | Status: DC | PRN
  Administered 2011-05-08: 0.5 mg via ORAL
  Filled 2011-05-08: qty 1

## 2011-05-08 MED ORDER — POTASSIUM CHLORIDE CRYS ER 20 MEQ PO TBCR
40.0000 meq | EXTENDED_RELEASE_TABLET | Freq: Once | ORAL | Status: AC
  Administered 2011-05-08: 40 meq via ORAL
  Filled 2011-05-08: qty 2

## 2011-05-08 MED ORDER — METOPROLOL TARTRATE 50 MG PO TABS
50.0000 mg | ORAL_TABLET | Freq: Four times a day (QID) | ORAL | Status: DC
  Administered 2011-05-08 – 2011-05-10 (×5): 50 mg via ORAL
  Filled 2011-05-08 (×13): qty 1

## 2011-05-08 MED ORDER — HEPARIN SOD (PORCINE) IN D5W 100 UNIT/ML IV SOLN
1650.0000 [IU]/h | INTRAVENOUS | Status: DC
  Administered 2011-05-08: 1650 [IU]/h via INTRAVENOUS
  Filled 2011-05-08 (×2): qty 250

## 2011-05-08 MED ORDER — HEPARIN SOD (PORCINE) IN D5W 100 UNIT/ML IV SOLN
1500.0000 [IU]/h | INTRAVENOUS | Status: DC
  Administered 2011-05-08: 1500 [IU]/h via INTRAVENOUS
  Filled 2011-05-08 (×3): qty 250

## 2011-05-08 MED ORDER — ASPIRIN 300 MG RE SUPP: 300.0000 mg | RECTAL | Status: AC

## 2011-05-08 MED ORDER — FLUTICASONE PROPIONATE 50 MCG/ACT NA SUSP: 2.0000 | Freq: Every day | NASAL | Status: DC | PRN

## 2011-05-08 MED ORDER — ASPIRIN 81 MG PO CHEW
324.0000 mg | CHEWABLE_TABLET | ORAL | Status: AC
  Administered 2011-05-08: 324 mg via ORAL
  Filled 2011-05-08: qty 3
  Filled 2011-05-08: qty 1

## 2011-05-08 MED ORDER — ZOLPIDEM TARTRATE 5 MG PO TABS
5.0000 mg | ORAL_TABLET | Freq: Every evening | ORAL | Status: DC | PRN
  Administered 2011-05-08: 5 mg via ORAL
  Filled 2011-05-08: qty 1

## 2011-05-08 MED ORDER — HEPARIN BOLUS VIA INFUSION
4000.0000 [IU] | Freq: Once | INTRAVENOUS | Status: AC
  Administered 2011-05-08: 4000 [IU] via INTRAVENOUS
  Filled 2011-05-08: qty 4000

## 2011-05-08 MED ORDER — NITROGLYCERIN 0.4 MG SL SUBL: 0.4000 mg | SUBLINGUAL_TABLET | SUBLINGUAL | Status: DC | PRN

## 2011-05-08 MED ORDER — ONDANSETRON HCL 4 MG/2ML IJ SOLN: 4.0000 mg | Freq: Four times a day (QID) | INTRAMUSCULAR | Status: DC | PRN

## 2011-05-08 MED ORDER — METOPROLOL TARTRATE 25 MG PO TABS
50.0000 mg | ORAL_TABLET | Freq: Once | ORAL | Status: AC
  Administered 2011-05-08: 50 mg via ORAL
  Filled 2011-05-08: qty 2

## 2011-05-08 NOTE — Progress Notes (Addendum)
Physician History and Physical    Miguel Christiana MRN: [id number] DOB/AGE: [birth year] 66 y.o. Admit date: 05/07/2011  Primary Care Physician: Dr. Paulino Rily Primary Cardiologist: Dr. Johney Frame  HPI:  66 yo with history of ARVD presents with VT episodes and atrial fibrillation with RVR.  Since the beginning of January, patient has noted more episodes of his heart fluttering.  He had not been shocked for 2 years, however, until last night when he was speaking to a large audience and felt his ICD shock him once then twice.  He felt his heart race prior to the shock.  After that, he was shocked 7 more times for a total of 9.  He never passed out.  He simply felt his heart racing.  No chest pain or SOB.  St Jude rep interrogated the ICD tonight.  He has had multiple episodes of VT since 04/02/11 (up to 60), most terminated by ATP.   He had 9 episodes of VT terminated by ICD discharge tonight.    PMH: 1. ARVD: History of VT.  Has St. Jude dual chamber ICD.  Cardiac MRI 2010 showed moderately dilated RV with hypokinesis, LV EF 47%.  Echo (11/0) showed EF 50-55% with dilated RV (difficult study).   2. Left heart cath (11/10) with no significant CAD. 3. Atrial fibrillation: Paroxysmal.  Not on coumadin.  4. H/o appendectomy   Current Facility-Administered Medications  Medication Dose Route Frequency Provider Last Rate Last Dose  . 0.9 %  sodium chloride infusion  250 mL Intravenous PRN Marca Ancona, MD      . acetaminophen (TYLENOL) tablet 650 mg  650 mg Oral Q4H PRN Marca Ancona, MD      . amiodarone (CORDARONE) 450 mg in dextrose 5 % 250 mL infusion  150 mg Intravenous Once Vida Roller, MD   150 mg at 05/08/11 0336  . amiodarone (NEXTERONE PREMIX) 360 mg/200 mL dextrose IV infusion  60 mg/hr Intravenous Continuous Glynn Octave, MD 33.3 mL/hr at 05/08/11 0700 60 mg/hr at 05/08/11 0700  . aspirin chewable tablet 324 mg  324 mg Oral NOW Marca Ancona, MD       Or  . aspirin suppository 300 mg   300 mg Rectal NOW Marca Ancona, MD      . aspirin EC tablet 81 mg  81 mg Oral Daily Marca Ancona, MD      . fluticasone [facility]) 50 MCG/ACT nasal spray 2 spray  2 spray Each Nare Daily PRN Marca Ancona, MD      . heparin ADULT infusion 100 units/ml (25000 units/250 ml)  1,500 Units/hr Intravenous Continuous Gardiner Rhyme, MD      . heparin bolus via infusion 4,000 Units  4,000 Units Intravenous Once Gardiner Rhyme, MD      . metoprolol (LOPRESSOR) tablet 50 mg  50 mg Oral Q6H Marca Ancona, MD      . metoprolol tartrate (LOPRESSOR) tablet 50 mg  50 mg Oral Once Marca Ancona, MD   50 mg at 05/08/11 0212  . nitroGLYCERIN (NITROSTAT) SL tablet 0.4 mg  0.4 mg Sublingual Q5 Min x 3 PRN Marca Ancona, MD      . ondansetron [facility]) injection 4 mg  4 mg Intravenous Q6H PRN Marca Ancona, MD      . sodium chloride 0.9 % injection 3 mL  3 mL Intravenous Q12H Marca Ancona, MD      . sodium chloride 0.9 % injection 3 mL  3 mL Intravenous PRN Marca Ancona,  MD      . zolpidem (AMBIEN) tablet 5 mg  5 mg Oral QHS PRN Marca Ancona, MD   5 mg at 05/08/11 1610  . DISCONTD: amiodarone (CORDARONE) 450 mg in dextrose 5 % 250 mL infusion  60 mg/hr Intravenous To Major Glynn Octave, MD        Physical Exam: Blood pressure 125/79, pulse 62, temperature 98 F (36.7 C), temperature source Oral, resp. rate 14, height 6\' 2"  (1.88 m), weight 220 lb 0.3 oz (99.8 kg), SpO2 96.00%.  General: NAD Neck: JVP 8 cm with +HJR, no thyromegaly or thyroid nodule.  Lungs: Clear to auscultation bilaterally with normal respiratory effort. CV: Nondisplaced PMI.  Heart irregular S1/S2, no S3/S4, no murmur.  No peripheral edema.  No carotid bruit.  Normal pedal pulses.  Abdomen: BS Soft, nontender, no hepatosplenomegaly, no distention.  Skin: Intact without lesions or rashes.  Neurologic: Alert and oriented x 3.  Extremities: No clubbing or cyanosis.  HEENT: Normal.   Labs:   Lab Results  Component Value Date   WBC 6.7  05/07/2011   HGB 15.9 05/07/2011   HCT 47.5 05/07/2011   MCV 84.8 05/07/2011   PLT 181 05/07/2011     Lab 05/07/11 2134  NA 139  K 3.7  CL 104  CO2 21  BUN 22  CREATININE 1.03  CALCIUM 9.2  PROT --  BILITOT --  ALKPHOS --  ALT --  AST --  GLUCOSE 179*   Lab Results  Component Value Date   CKTOTAL 506* 05/07/2011   CKMB 13.7* 05/07/2011   TROPONINI 1.62* 05/07/2011     Radiology: CXR with no acute findings  EKG: Atrial fibrillation at 114, T wave inversions V1 and V2, likely iRBBB, no Epsilon waves seen.   ASSESSMENT AND PLAN:  66 yo with history of ARVD presents with VT episodes and atrial fibrillation with RVR.  1. VT: In setting of ARVD.  Increased frequency of activity over the last month.  Question at this point will be how to suppress future VT.  - Ablation vs. Medical therapy ( Sotalol/amio).    - Metoprolol to 50 mg po q6 hrs. - 2D ECHO pending. - Dr. Johney Frame to see pt today for further plan.    2. Atrial fibrillation: Patient has h/o PAF.  He has not been on coumadin (CHADSVASC = 1 for age).  Rate control for now with amiodarone.  - Pt converted to NSR - Continue heparin gtt for now.   3. Elevated cardiac enzymes: Most likely from multiple defibrillations.  ACS unlikely.  Signed: PATEL,RAVI 05/08/2011, 7:32 AM   Patient seen and examined with housestaff. We discussed all aspects of the encounter. I agree with the assessment and plan as stated above. 66 y/o with ARVD now with multiple episodes of VT. Currently on amio. EP to see for further management. Keep K+ > 4.0 and Mag > 2.0.  Libra Gatz,MD 9:18 AM

## 2011-05-08 NOTE — Progress Notes (Signed)
ANTICOAGULATION CONSULT NOTE - Follow Up Consult  Pharmacy Consult for heparin Indication: atrial fibrillation  No Known Allergies  Patient Measurements: Height: 6\' 2"  (188 cm) Weight: 220 lb 0.3 oz (99.8 kg) IBW/kg (Calculated) : 82.2  Heparin Dosing Weight:   Vital Signs: Temp: 98.4 F (36.9 C) (02/08 1104) Temp src: Oral (02/08 1104) BP: 117/64 mmHg (02/08 1400) Pulse Rate: 58  (02/08 1400)  Labs:  Basename 05/08/11 1438 05/08/11 1154 05/08/11 0705 05/07/11 2135 05/07/11 2134  HGB -- -- -- -- 15.9  HCT -- -- -- -- 47.5  PLT -- -- -- -- 181  APTT -- -- -- -- --  LABPROT -- -- -- -- 13.7  INR -- -- -- -- 1.03  HEPARINUNFRC 0.34 -- -- -- --  CREATININE -- 0.95 -- -- 1.03  CKTOTAL -- 361* 418* 506* --  CKMB -- 9.5* 12.5* 13.7* --  TROPONINI -- 1.32* 3.56* 1.62* --   Estimated Creatinine Clearance: 96.5 ml/min (by C-G formula based on Cr of 0.95).   Medications:  Scheduled:    . amiodarone (CORDARONE) infusion  150 mg Intravenous Once  . aspirin  324 mg Oral NOW   Or  . aspirin  300 mg Rectal NOW  . aspirin EC  81 mg Oral Daily  . heparin  4,000 Units Intravenous Once  . metoprolol tartrate  50 mg Oral Q6H  . metoprolol tartrate  50 mg Oral Once  . potassium chloride  40 mEq Oral Once  . sodium chloride  3 mL Intravenous Q12H  . DISCONTD: amiodarone (CORDARONE) infusion  60 mg/hr Intravenous To Major   Infusions:    . amiodarone (NEXTERONE PREMIX) 360 mg/200 mL dextrose 60 mg/hr (05/08/11 0800)  . heparin 1,500 Units/hr (05/08/11 1200)    Assessment: 66 yo male with afib is currently on therapeutic heparin.  Heparin level was 0.34 (Goal 0.3-0.7) Goal of Therapy:  Heparin level 0.3-0.7 units/ml   Plan:  1) Cont heparin at 1500 units/hr (=33ml/hr). 2) Check a 6hr heparin level to confirm.  Bathsheba Durrett, Tsz-Yin 05/08/2011,3:12 PM

## 2011-05-08 NOTE — Progress Notes (Signed)
ANTICOAGULATION CONSULT NOTE - Initial Consult  Pharmacy Consult for Heparin  Indication: Vtach  H/o Afib  No Known Allergies  Patient Measurements: Height: 6\' 2"  (188 cm) Weight: 220 lb 0.3 oz (99.8 kg) IBW/kg (Calculated) : 82.2    Vital Signs: Temp: 98 F (36.7 C) (02/08 0640) Temp src: Oral (02/08 0640) BP: 122/83 mmHg (02/08 0504) Pulse Rate: 65  (02/08 0640)  Labs:  Robert Ochoa 05/07/11 2135 05/07/11 2134  HGB -- 15.9  HCT -- 47.5  PLT -- 181  APTT -- --  LABPROT -- 13.7  INR -- 1.03  HEPARINUNFRC -- --  CREATININE -- 1.03  CKTOTAL 506* --  CKMB 13.7* --  TROPONINI 1.62* --   Estimated Creatinine Clearance: 89 ml/min (by C-G formula based on Cr of 1.03).  Medical History: Past Medical History  Diagnosis Date  . Ventricular tachycardia   . Arrhythmogenic right ventricular dysplasia   . Atrial fibrillation or flutter   . Pacemaker   . Cardiac defibrillator in place     Medications:  Prescriptions prior to admission  Medication Sig Dispense Refill  . aspirin 325 MG tablet Take 325 mg by mouth daily.      . Calcium-Magnesium-Zinc (CAL-MAG-ZINC PO) Take 1 tablet by mouth daily.       . fluticasone (FLONASE) 50 MCG/ACT nasal spray Place 2 sprays into the nose daily as needed. For congestion      . metoprolol tartrate (LOPRESSOR) 25 MG tablet Take 75 mg by mouth 2 (two) times daily.      . Omega-3 Fatty Acids (FISH OIL) 1000 MG CAPS Take by mouth daily.        . vitamin C (ASCORBIC ACID) 500 MG tablet Take 500 mg by mouth daily.          Assessment: 66 yo male with new tachycardia for Heparin  Goal of Therapy:  Heparin level 0.3-0.7 units/ml   Plan:  Heparin 4000 units IV bolus, then 1500 units/hr Check heparin level in 6 hours.  Robert Ochoa 05/08/2011,6:45 AM

## 2011-05-08 NOTE — ED Notes (Signed)
DR. Golden Circle NOTIFIED ON PT'S BRIEF RUN OF V-TACH , ADVISED NURSE TO CONTINUE AMIODARONE DRIP.

## 2011-05-08 NOTE — Progress Notes (Signed)
ANTICOAGULATION CONSULT NOTE - Follow Up Consult  Pharmacy Consult:  Heparin Indication: atrial fibrillation   HL = 0.25 (goal 0.3 - 0.7 units/mL) Heparin weight = 100kg  Assessment: 68 YOM with h/o Afib on heparin with subtherapeutic heparin level.  Heparin infusing without complications per RN.  No bleeding noted.  Plan: - Increase heparin gtt to 1650 units/hr - F/U AM labs   Phillips Climes, PharmD 05/08/2011,9:23 PM

## 2011-05-08 NOTE — Progress Notes (Signed)
UR Completed. Robert Ochoa, Robert Ochoa [phone]  

## 2011-05-08 NOTE — ED Notes (Signed)
DR. Hyacinth Meeker NOTIFIED DR. Shirlee Latch ON PT'S  RUNS OF V-TACH . PT. DENIES CHEST PAIN , NO SOB .

## 2011-05-08 NOTE — ED Notes (Signed)
AMIODARONE 150 MG BOLUS GIVEN .

## 2011-05-08 NOTE — Progress Notes (Signed)
  Echocardiogram 2D Echocardiogram has been performed.  Cathie Beams Deneen 05/08/2011, 9:18 AM

## 2011-05-08 NOTE — H&P (Signed)
Physician History and Physical    Robert Ochoa MRN: [id number] DOB/AGE: 1945/04/05 66 y.o. Admit date: 05/07/2011  Primary Care Physician: Dr. Paulino Rily Primary Cardiologist: Dr. Johney Frame  HPI:  66 yo with history of ARVD presents with VT episodes and atrial fibrillation with RVR.  Since the beginning of January, patient has noted more episodes of his heart fluttering.  He had not been shocked for 2 years, however, until tonight.  Tonight, he was speaking to a large audience and felt his ICD shock him once then twice.  He felt his heart race prior to the shock.  After that, he was shocked 7 more times for a total of 9.  He never passed out.  He simply felt his heart racing.  No chest pain.    Patient was brought to ER.  Amiodarone gtt was started.  Patient has been in atrial fibrillation since arriving in ER.  HR initially in 140s, now controlled in the 90s to 100s.  He has a history of short-lived paroxysmal atrial fibrillation in the past.  At baseline, he has been doing well recently with no exertional dyspnea or chest pain.   [employer] rep interrogated the ICD tonight.  He has had multiple episodes of VT since 04/02/11 (up to 60), most terminated by [employer].   He had 9 episodes of VT terminated by ICD discharge tonight.   Review of systems complete and found to be negative unless listed above   PMH: 1. ARVD: History of VT.  Has [employer]. Jude dual chamber ICD.  Cardiac MRI 2010 showed moderately dilated RV with hypokinesis, LV EF 47%.  Echo (11/0) showed EF 50-55% with dilated RV (difficult study).   2. Left heart cath (11/10) with no significant CAD. 3. Atrial fibrillation: Paroxysmal.  Not on coumadin.  4. H/o appendectomy  Family History  Problem Relation Age of Onset  . Heart attack Father     History   Social History  . Marital Status: Divorced    Spouse Name: N/A    Number of Children: N/A  . Years of Education: N/A   Occupational History  . Not on file.   Social History Main Topics    . Smoking status: Never Smoker   . Smokeless tobacco: Not on file  . Alcohol Use: Yes     social  . Drug Use: No  . Sexually Active: Not on file   Other Topics Concern  . Not on file   Social History Narrative   Divorced w 1 child. Self employed.     Current Facility-Administered Medications  Medication Dose Route Frequency Provider Last Rate Last Dose  . amiodarone (NEXTERONE PREMIX) 360 mg/200 mL dextrose IV infusion  60 mg/hr Intravenous Continuous Glynn Octave, MD 33.3 mL/hr at 05/07/11 2153 60 mg/hr at 05/07/11 2153  . DISCONTD: amiodarone (CORDARONE) 450 mg in dextrose 5 % 250 mL infusion  60 mg/hr Intravenous To Major Glynn Octave, MD       Current Outpatient Prescriptions  Medication Sig Dispense Refill  . aspirin 325 MG tablet Take 325 mg by mouth daily.      . Calcium-Magnesium-Zinc (CAL-MAG-ZINC PO) Take 1 tablet by mouth daily.       . fluticasone (FLONASE) 50 MCG/ACT nasal spray Place 2 sprays into the nose daily as needed. For congestion      . metoprolol tartrate (LOPRESSOR) 25 MG tablet Take 75 mg by mouth 2 (two) times daily.      . Omega-3 Fatty Acids (FISH  OIL) 1000 MG CAPS Take by mouth daily.        . vitamin C (ASCORBIC ACID) 500 MG tablet Take 500 mg by mouth daily.          Physical Exam: Blood pressure 159/99, pulse 108, temperature 99.3 F (37.4 C), temperature source Oral, resp. rate 14, SpO2 97.00%.  General: NAD Neck: JVP 8 cm with +HJR, no thyromegaly or thyroid nodule.  Lungs: Clear to auscultation bilaterally with normal respiratory effort. CV: Nondisplaced PMI.  Heart irregular S1/S2, no S3/S4, no murmur.  No peripheral edema.  No carotid bruit.  Normal pedal pulses.  Abdomen: Soft, nontender, no hepatosplenomegaly, no distention.  Skin: Intact without lesions or rashes.  Neurologic: Alert and oriented x 3.  Psych: Normal affect. Extremities: No clubbing or cyanosis.  HEENT: Normal.   Labs:   Lab Results  Component Value Date    WBC 6.7 05/07/2011   HGB 15.9 05/07/2011   HCT 47.5 05/07/2011   MCV 84.8 05/07/2011   PLT 181 05/07/2011    Lab 05/07/11 2134  NA 139  K 3.7  CL 104  CO2 21  BUN 22  CREATININE 1.03  CALCIUM 9.2  PROT --  BILITOT --  ALKPHOS --  ALT --  AST --  GLUCOSE 179*   Lab Results  Component Value Date   CKTOTAL 506* 05/07/2011   CKMB 13.7* 05/07/2011   TROPONINI 1.62* 05/07/2011     Radiology: CXR with no acute findings  EKG: Atrial fibrillation at 114, T wave inversions V1 and V2, likely iRBBB, no Epsilon waves seen.   ASSESSMENT AND PLAN:  66 yo with history of ARVD presents with VT episodes and atrial fibrillation with RVR. 1. VT: In setting of ARVD.  Increased frequency of activity over the last month.  Question at this point will be how to suppress future VT.  Ablation would be an option but would be difficult with a diffusely diseased RV.  Sotalol or amiodarone are medical options.  Sotalol may work better but the patient is already on amiodarone gtt.  Dr. Johney Frame will see him in the morning and decide on further therapy.  For now, I will continue the amiodarone overnight. I will change metoprolol to 50 mg po q6 hrs. I am also going to get an echo to followup on the RV (and also to see if there is any LV involvement).   2. Atrial fibrillation: Patient has h/o PAF.  He has not been on coumadin (CHADSVASC = 1 for age).  Rate control for now with amiodarone.  Hopefully he will convert to NSR overnight (afib episodes have been short in the past).  I am going to put him on a heparin gtt for now.  3. Elevated cardiac enzymes: I think that this is a consequence of 9 defibrillator discharges and some myocardial necrosis.  Doubt ACS.   Signed: Marca Ancona 05/08/2011, 1:37 AM

## 2011-05-08 NOTE — ED Provider Notes (Signed)
  Physical Exam  BP 129/84  Pulse 94  Temp(Src) 99.3 F (37.4 C) (Oral)  Resp 14  SpO2 97%  Physical Exam  ED Course  Procedures  MDM Patient still in the emergency department, has had several runs of ventricular tachycardia lasting between 20 and 40 seconds. On my evaluation during an episode the patient remains clinically stable with both blood pressure and mental status. I have discussed his care with the cardiologist Dr. Jearld Pies who agrees with giving another dose of amiodarone 150 mg IV push.  Patient has not had a defibrillator fire due to these episodes at this time.      Vida Roller, MD 05/08/11 [phone]

## 2011-05-08 NOTE — Consult Note (Signed)
  Reason for Consult: Multiple ICD shocks  Referring Physician: Jerard Ochoa is an 66 y.o. male.   HPI: [name] is a very pleasant 66 yo man with ARVD and VT. He was in his usual state of health until yesterday when he experienced minimal palpitations and lightheadedness and received multiple ICD shocks. He was found to have both atrial fib with an RVR and VT. He has been placed on IV amiodarone. He has been stable since. He notes that he has had spells several weeks ago with weakness and sob but did not seek medical attention. Prior to admission he was on Sotalol.  PMH: Past Medical History  Diagnosis Date  . Ventricular tachycardia   . Arrhythmogenic right ventricular dysplasia   . Atrial fibrillation or flutter   . Pacemaker   . Cardiac defibrillator in place     PSHX: Past Surgical History  Procedure Date  . Appendectomy   . Cardiac defibrillator placement 02/19/09     St,. Jude   . Pacemaker insertion   . Cardiac defibrillator placement     FAMHX: Family History  Problem Relation Age of Onset  . Heart attack Father     Social History:  reports that he has never smoked. He does not have any smokeless tobacco history on file. He reports that he drinks alcohol. He reports that he does not use illicit drugs.  Allergies: No Known Allergies   Dg Chest Portable 1 View  05/07/2011  *RADIOLOGY REPORT*  Clinical Data: 66 year old male with tachycardia, defibrillator fired.  PORTABLE CHEST - 1 VIEW  Comparison: 02/20/2009.  Findings: AP portable semi upright view at 2142 hours.  Lower lung volumes.  Stable cardiac size and mediastinal contours.  Left chest cardiac AICD again noted.  No pneumothorax, pulmonary edema or pleural effusion.  Streaky retrocardiac opacity most resembles atelectasis.  IMPRESSION: Low lung volumes, otherwise no acute cardiopulmonary abnormality.  Original Report Authenticated By: Ulla Potash III, M.D.    ROS  As stated in the HPI and negative  for all other systems.  Physical Exam  Vitals:Blood pressure 114/71, pulse 53, temperature 97.7 F (36.5 C), temperature source Oral, resp. rate 15, height 6\' 2"  (1.88 m), weight 99.8 kg (220 lb 0.3 oz), SpO2 96.00%.  Well appearing NAD HEENT: Unremarkable Neck:  No JVD, no thyromegally Lymphatics:  No adenopathy Back:  No CVA tenderness Lungs:  Clear with no wheezes, rales, or rhonchi HEART:  Regular rate rhythm, no murmurs, no rubs, no clicks Abd:  Flat, positive bowel sounds, no organomegally, no rebound, no guarding Ext:  2 plus pulses, no edema, no cyanosis, no clubbing Skin:  No rashes no nodules Neuro:  CN II through XII intact, motor grossly intact  Assessment/Plan: 1. ICD shocks due to both atrial fib with an RVR and VT 2. ARVD 3. S/p ICD Rec: IV amiodarone transitioning to oral amiodarone (200 mg tid) on 05/09/2011. Watch in hospital until 05/11/2011.  Sharlot Gowda TaylorMD 05/08/2011, 5:11 PM

## 2011-05-09 ENCOUNTER — Other Ambulatory Visit: Payer: Self-pay

## 2011-05-09 DIAGNOSIS — I4891 Unspecified atrial fibrillation: Secondary | ICD-10-CM | POA: Diagnosis not present

## 2011-05-09 DIAGNOSIS — I472 Ventricular tachycardia: Secondary | ICD-10-CM | POA: Diagnosis not present

## 2011-05-09 LAB — CBC
MCH: 29 pg (ref 26.0–34.0)
MCHC: 34.2 g/dL (ref 30.0–36.0)
Platelets: 154 10*3/uL (ref 150–400)

## 2011-05-09 LAB — HEPARIN LEVEL (UNFRACTIONATED): Heparin Unfractionated: 0.45 IU/mL (ref 0.30–0.70)

## 2011-05-09 MED ORDER — DABIGATRAN ETEXILATE MESYLATE 150 MG PO CAPS
150.0000 mg | ORAL_CAPSULE | Freq: Two times a day (BID) | ORAL | Status: DC
  Administered 2011-05-09 – 2011-05-11 (×5): 150 mg via ORAL
  Filled 2011-05-09 (×6): qty 1

## 2011-05-09 MED ORDER — AMIODARONE HCL 200 MG PO TABS
400.0000 mg | ORAL_TABLET | Freq: Two times a day (BID) | ORAL | Status: DC
  Administered 2011-05-09 – 2011-05-11 (×5): 400 mg via ORAL
  Filled 2011-05-09 (×6): qty 2

## 2011-05-09 NOTE — Progress Notes (Signed)
Report called to [employer] on 3700. Patient will be transferred to [id number].

## 2011-05-09 NOTE — Progress Notes (Signed)
SUBJECTIVE: The patient is doing well today.  At this time, he denies chest pain, shortness of breath, or any new concerns. No further arrhythmias    . amiodarone  400 mg Oral BID  . dabigatran  150 mg Oral Q12H  . metoprolol tartrate  50 mg Oral Q6H  . potassium chloride  40 mEq Oral Once  . sodium chloride  3 mL Intravenous Q12H  . DISCONTD: aspirin EC  81 mg Oral Daily      . DISCONTD: amiodarone (NEXTERONE PREMIX) 360 mg/200 mL dextrose 30 mg/hr (05/09/11 0800)  . DISCONTD: heparin 1,500 Units/hr (05/08/11 1900)  . DISCONTD: heparin 1,650 Units/hr (05/09/11 0800)    OBJECTIVE: Physical Exam: Filed Vitals:   05/09/11 0000 05/09/11 0345 05/09/11 0400 05/09/11 0822  BP: 103/84 112/78  129/80  Pulse:    66  Temp: 98 F (36.7 C)  98.2 F (36.8 C) 97.6 F (36.4 C)  TempSrc: Oral  Oral Oral  Resp: 16  16 18   Height:      Weight:      SpO2: 95%  96%     Intake/Output Summary (Last 24 hours) at 05/09/11 0944 Last data filed at 05/09/11 0800  Gross per 24 hour  Intake 1441.6 ml  Output   2675 ml  Net -1233.4 ml    Telemetry reveals sinus rhythm  GEN- The patient is well appearing, alert and oriented x 3 today.   Head- normocephalic, atraumatic Eyes-  Sclera clear, conjunctiva pink Ears- hearing intact Oropharynx- clear Neck- supple, no JVP Lymph- no cervical lymphadenopathy Lungs- Clear to ausculation bilaterally, normal work of breathing Heart- Regular rate and rhythm, no murmurs, rubs or gallops, PMI not laterally displaced GI- soft, NT, ND, + BS Extremities- no clubbing, cyanosis, or edema Skin- no rash or lesion Psych- euthymic mood, full affect Neuro- strength and sensation are intact  LABS: Basic Metabolic Panel:  Basename 05/08/11 2005 05/08/11 1154 05/07/11 2134  NA 137 135 --  K 4.0 4.0 --  CL 106 106 --  CO2 22 22 --  GLUCOSE 105* 112* --  BUN 16 15 --  CREATININE 1.01 0.95 --  CALCIUM 9.0 8.9 --  MG -- -- 2.1  PHOS -- -- --   Liver  Function Tests: No results found for this basename: AST:2,ALT:2,ALKPHOS:2,BILITOT:2,PROT:2,ALBUMIN:2 in the last 72 hours No results found for this basename: LIPASE:2,AMYLASE:2 in the last 72 hours CBC:  Basename 05/09/11 0500 05/07/11 2134  WBC 6.5 6.7  NEUTROABS -- 4.7  HGB 15.4 15.9  HCT 45.0 47.5  MCV 84.7 84.8  PLT 154 181   Cardiac Enzymes:  Basename 05/08/11 1859 05/08/11 1154 05/08/11 0705  CKTOTAL 335* 361* 418*  CKMB 7.7* 9.5* 12.5*  CKMBINDEX -- -- --  TROPONINI 0.58* 1.32* 3.56*   BNP: No components found with this basename: POCBNP:3 D-Dimer: No results found for this basename: DDIMER:2 in the last 72 hours Hemoglobin A1C: No results found for this basename: HGBA1C in the last 72 hours Fasting Lipid Panel: No results found for this basename: CHOL,HDL,LDLCALC,TRIG,CHOLHDL,LDLDIRECT in the last 72 hours Thyroid Function Tests:  Basename 05/08/11 0705  TSH 1.066  T4TOTAL --  T3FREE --  THYROIDAB --   Anemia Panel: No results found for this basename: VITAMINB12,FOLATE,FERRITIN,TIBC,IRON,RETICCTPCT in the last 72 hours  RADIOLOGY: Dg Chest Portable 1 View  05/07/2011  *RADIOLOGY REPORT*  Clinical Data: 66 year old male with tachycardia, defibrillator fired.  PORTABLE CHEST - 1 VIEW  Comparison: 02/20/2009.  Findings: AP portable semi upright view  at 2142 hours.  Lower lung volumes.  Stable cardiac size and mediastinal contours.  Left chest cardiac AICD again noted.  No pneumothorax, pulmonary edema or pleural effusion.  Streaky retrocardiac opacity most resembles atelectasis.  IMPRESSION: Low lung volumes, otherwise no acute cardiopulmonary abnormality.  Original Report Authenticated By: Harley Hallmark, M.D.    ASSESSMENT AND PLAN:  Active Problems:  * No active hospital problems. *  1. VT- he has a h/o ARVD with prior VT Recent ICD shocks were for afib and VT, pt has failed sotalol and is now on amiodarone doing much better Stop IV amiodarone Amiodarone 400mg   BID  2.  Afib/ atrial flutter Now in sinus Po amiodarone as above Stop heparin gtt Start pradaxa 150mg  BID this am  To telemetry today Likely home on Monday   Hillis Range, MD 05/09/2011 9:44 AM

## 2011-05-09 NOTE — Progress Notes (Signed)
All instructions reviewed with patient and copy of information provided to him. Patient verbalized understanding of all instructions and forms signed. Provided him with copy of discharge instructions per physician request for him to take to [facility] appointment on Feb. 11th. IV's removed, dressings applied and catheters intact. Patients family present to take him home. Patient had no questions at this time.

## 2011-05-09 NOTE — Progress Notes (Signed)
ANTICOAGULATION CONSULT NOTE - Follow Up Consult  Pharmacy Consult for heparin Indication: atrial fibrillation  No Known Allergies  Patient Measurements: Height: 6\' 2"  (188 cm) Weight: 220 lb 0.3 oz (99.8 kg) IBW/kg (Calculated) : 82.2  Heparin Dosing Weight:   Vital Signs: Temp: 98.2 F (36.8 C) (02/09 0400) Temp src: Oral (02/09 0400) BP: 112/78 mmHg (02/09 0345)  Labs:  Basename 05/09/11 0500 05/08/11 2005 05/08/11 1859 05/08/11 1438 05/08/11 1154 05/08/11 0705 05/07/11 2134  HGB 15.4 -- -- -- -- -- 15.9  HCT 45.0 -- -- -- -- -- 47.5  PLT 154 -- -- -- -- -- 181  APTT -- -- -- -- -- -- --  LABPROT -- -- -- -- -- -- 13.7  INR -- -- -- -- -- -- 1.03  HEPARINUNFRC 0.45 0.25* -- 0.34 -- -- --  CREATININE -- 1.01 -- -- 0.95 -- 1.03  CKTOTAL -- -- 335* -- 361* 418* --  CKMB -- -- 7.7* -- 9.5* 12.5* --  TROPONINI -- -- 0.58* -- 1.32* 3.56* --   Estimated Creatinine Clearance: 90.8 ml/min (by C-G formula based on Cr of 1.01).   Medications:  Scheduled:     . aspirin EC  81 mg Oral Daily  . metoprolol tartrate  50 mg Oral Q6H  . potassium chloride  40 mEq Oral Once  . sodium chloride  3 mL Intravenous Q12H   Infusions:     . amiodarone (NEXTERONE PREMIX) 360 mg/200 mL dextrose 30 mg/hr (05/09/11 0746)  . heparin 1,650 Units/hr (05/08/11 2212)  . DISCONTD: heparin 1,500 Units/hr (05/08/11 1900)    Assessment: 66 yo male with afib is currently on therapeutic heparin.  Heparin level was 0.45 (Goal 0.3-0.7). CBC stable. No bleeding issues Goal of Therapy:  Heparin level 0.3-0.7 units/ml   Plan:  1) Cont heparin at 1650 units/hr (=16.83ml/hr). 2) Check AM heparin level and CBC.  Janace Litten, PharmD [phone] 05/09/2011,8:16 AM

## 2011-05-10 ENCOUNTER — Other Ambulatory Visit: Payer: Self-pay

## 2011-05-10 DIAGNOSIS — I472 Ventricular tachycardia: Secondary | ICD-10-CM | POA: Diagnosis not present

## 2011-05-10 LAB — CBC
Hemoglobin: 15.7 g/dL (ref 13.0–17.0)
MCH: 28.3 pg (ref 26.0–34.0)
MCV: 85.2 fL (ref 78.0–100.0)
Platelets: 159 10*3/uL (ref 150–400)
RBC: 5.54 MIL/uL (ref 4.22–5.81)
WBC: 6.8 10*3/uL (ref 4.0–10.5)

## 2011-05-10 MED ORDER — METOPROLOL TARTRATE 50 MG PO TABS
75.0000 mg | ORAL_TABLET | Freq: Two times a day (BID) | ORAL | Status: DC
  Administered 2011-05-10 – 2011-05-11 (×2): 75 mg via ORAL
  Filled 2011-05-10 (×3): qty 1

## 2011-05-10 NOTE — Progress Notes (Signed)
Pt to receive 50mg  of lopressor and hr running 48-52 beats/min. Pt a little unsure of hr running so low. Dr Antoine Poche made aware of HR and orders to hold this dose given. Will cont to monitor patient. Patient aware of holding med. Will cont to monitor closely.

## 2011-05-10 NOTE — Progress Notes (Signed)
Patient ID: Robert Ochoa, male   DOB: [birth year], 66 y.o.   MRN: [id number] SUBJECTIVE: The patient is doing well today.  At this time, he denies chest pain, shortness of breath, or any new concerns. No further arrhythmias Tentative plan to D/C am per JA    . amiodarone  400 mg Oral BID  . dabigatran  150 mg Oral Q12H  . metoprolol tartrate  50 mg Oral Q6H  . sodium chloride  3 mL Intravenous Q12H      OBJECTIVE: Physical Exam: Filed Vitals:   05/10/11 0246 05/10/11 0600 05/10/11 0833 05/10/11 1057  BP: 125/77 121/78 129/76 100/69  Pulse: 48 57 61 58  Temp:  98.8 F (37.1 C)    TempSrc:      Resp:  18    Height:      Weight:      SpO2:  95%      Intake/Output Summary (Last 24 hours) at 05/10/11 1124 Last data filed at 05/09/11 1700  Gross per 24 hour  Intake  746.7 ml  Output    277 ml  Net  469.7 ml    Telemetry reveals sinus rhythm  GEN- The patient is well appearing, alert and oriented x 3 today.   Head- normocephalic, atraumatic Eyes-  Sclera clear, conjunctiva pink Ears- hearing intact Oropharynx- clear Neck- supple, no JVP Lymph- no cervical lymphadenopathy Lungs- Clear to ausculation bilaterally, normal work of breathing Heart- Regular rate and rhythm, no murmurs, rubs or gallops, PMI not laterally displaced GI- soft, NT, ND, + BS Extremities- no clubbing, cyanosis, or edema Skin- no rash or lesion Psych- euthymic mood, full affect Neuro- strength and sensation are intact AICD under left clavicle  LABS: Basic Metabolic Panel:  Basename 05/08/11 2005 05/08/11 1154 05/07/11 2134  NA 137 135 --  K 4.0 4.0 --  CL 106 106 --  CO2 22 22 --  GLUCOSE 105* 112* --  BUN 16 15 --  CREATININE 1.01 0.95 --  CALCIUM 9.0 8.9 --  MG -- -- 2.1  PHOS -- -- --   Liver Function Tests: No results found for this basename: AST:2,ALT:2,ALKPHOS:2,BILITOT:2,PROT:2,ALBUMIN:2 in the last 72 hours No results found for this basename: LIPASE:2,AMYLASE:2 in the last 72  hours CBC:  Basename 05/10/11 0549 05/09/11 0500 05/07/11 2134  WBC 6.8 6.5 --  NEUTROABS -- -- 4.7  HGB 15.7 15.4 --  HCT 47.2 45.0 --  MCV 85.2 84.7 --  PLT 159 154 --   Cardiac Enzymes:  Basename 05/08/11 1859 05/08/11 1154 05/08/11 0705  CKTOTAL 335* 361* 418*  CKMB 7.7* 9.5* 12.5*  CKMBINDEX -- -- --  TROPONINI 0.58* 1.32* 3.56*   Thyroid Function Tests:  Basename 05/08/11 0705  TSH 1.066  T4TOTAL --  T3FREE --  THYROIDAB --   Anemia Panel: No results found for this basename: VITAMINB12,FOLATE,FERRITIN,TIBC,IRON,RETICCTPCT in the last 72 hours  RADIOLOGY: Dg Chest Portable 1 View  05/07/2011  *RADIOLOGY REPORT*  Clinical Data: 66 year old male with tachycardia, defibrillator fired.  PORTABLE CHEST - 1 VIEW  Comparison: 02/20/2009.  Findings: AP portable semi upright view at 2142 hours.  Lower lung volumes.  Stable cardiac size and mediastinal contours.  Left chest cardiac AICD again noted.  No pneumothorax, pulmonary edema or pleural effusion.  Streaky retrocardiac opacity most resembles atelectasis.  IMPRESSION: Low lung volumes, otherwise no acute cardiopulmonary abnormality.  Original Report Authenticated By: Harley Hallmark, M.D.    ASSESSMENT AND PLAN:  1. VT- he has a h/o ARVD with  prior VT Recent ICD shocks were for afib and VT, pt has failed sotalol and is now on amiodarone doing much better Continue po amiodarone 400 bid.  TSH normal  Will order LFT;s while in hospital.  Outpatient PFT;s  2.  Afib/ atrial flutter Now in sinus Po amiodarone as above On pradaxa 150mg  BID this am  Likely home on Monday   Charlton Haws, MD 05/10/2011 11:24 AM

## 2011-05-11 ENCOUNTER — Encounter (HOSPITAL_COMMUNITY): Payer: Self-pay | Admitting: Physician Assistant

## 2011-05-11 ENCOUNTER — Other Ambulatory Visit: Payer: Self-pay | Admitting: *Deleted

## 2011-05-11 ENCOUNTER — Other Ambulatory Visit: Payer: Self-pay | Admitting: Internal Medicine

## 2011-05-11 DIAGNOSIS — I423 Endomyocardial (eosinophilic) disease: Secondary | ICD-10-CM

## 2011-05-11 DIAGNOSIS — I472 Ventricular tachycardia: Secondary | ICD-10-CM | POA: Diagnosis not present

## 2011-05-11 DIAGNOSIS — Q249 Congenital malformation of heart, unspecified: Secondary | ICD-10-CM

## 2011-05-11 DIAGNOSIS — I4891 Unspecified atrial fibrillation: Secondary | ICD-10-CM | POA: Diagnosis not present

## 2011-05-11 LAB — HEPATIC FUNCTION PANEL
ALT: 31 U/L (ref 0–53)
AST: 27 U/L (ref 0–37)
Alkaline Phosphatase: 80 U/L (ref 39–117)
Indirect Bilirubin: 0.8 mg/dL (ref 0.3–0.9)
Total Protein: 7.8 g/dL (ref 6.0–8.3)

## 2011-05-11 MED ORDER — AMIODARONE HCL 200 MG PO TABS: ORAL_TABLET | ORAL | Status: DC

## 2011-05-11 MED ORDER — DABIGATRAN ETEXILATE MESYLATE 150 MG PO CAPS: 150.0000 mg | ORAL_CAPSULE | Freq: Two times a day (BID) | ORAL | Status: DC

## 2011-05-11 NOTE — Telephone Encounter (Signed)
Bjorn Loser calls today requesting Pradaxa samples for pt and for nurse to do prior authorization for this. He was unable to get his medication b/c there was no prior authorization. Samples placed at desk for wife to pick. Pt is out of his medication. Mylo Red RN

## 2011-05-11 NOTE — Discharge Summary (Signed)
Discharge Summary   Patient ID: Robert Ochoa MRN: [id number], DOB/AGE: 67-Sep-1947 67 y.o. Admit date: 05/07/2011 D/C date:     05/11/2011   Primary Discharge Diagnoses:  1. ICD Discharges due to both atrial fib with RVR and ventricular tachycardia 2. Cardiomyopathy with EF 45% by echo 05/08/11 - felt secondary to ventricular stunning following ICD 9 shocks - repeat echo scheduled for 3 months 3. Elevated cardiac enzymes felt secondary to ICD discharge 4. ARVD with history of VT - failed metoprolol as monotherapy - s/p dual chamber St Jude ICD 2010 - Cardiac MRI 2010 showed moderately dilated RV with hypokinesis, LV EF 47%. Echo (11/0) showed EF 50-55% with dilated RV (difficult study) - left heart cath 01/2009 without significant CAD 5. Paroxysmal atrial fibrillation/atrial flutter - started on Pradaxa this admission  Secondary Discharge Diagnoses:  1. H/o appendectomy  Hospital Course: 66 y/o M with hx of ARVD/VT presented with episodes of heart fluttering since January. While speaking to a large audience, he felt his ICD shock him twice. He felt his heart race prior to this. After this he was shocked 7 more times. He denied chest pain. He was brought to the ER where amiodarone gtt was started. He was noted to be in afib in the ER with initial HR 140's, down to 90s-100's by time of cardiology evaluation. ICD interrogation showed multiple episodes of VT since 04/02/11 (up to 60), most terminated by ATP. He had 9 episodes of VT terminated by ICD discharge the night of admission. Amiodarone was continued, metoprolol was uptitrated. He was placed on a heparin gtt for his atrial fibrillation. His elevated troponin was felt secondary to myocardial necrosis in the setting of defibrillator discharges. 2D echo was obtained 2/8 demonstrating decreased EF of 45% with diffuse hypokinesis (see below) - this depression was felt secondary to ventricular stunning. RV showed mildly reduced systolic fcn with the  apex of the RV appearing more hypokinetic than the base. He was transitioned to po amiodarone over the next two days. Pradaxa was started for po anticoagulation. ACEI was not started secondary to borderline low blood pressures. By 05/11/11 he was feeling well without further arrhythmias. Dr. Johney Frame has seen and examined him today and feels he is stable for discharge. He feels the patient should stop aspirin at home since he will be taking Pradaxa from now on. Of note, Mr. Toso had a normal TSH this admission. He will have a CMET drawn for baseline prior to discharge & PFTs will be scheduled as an outpatient due to initiation of amiodarone.  Discharge Vitals: Blood pressure 114/67, pulse 58, temperature 98.3 F (36.8 C), temperature source Oral, resp. rate 18, height 6\' 2"  (1.88 m), weight 220 lb 0.3 oz (99.8 kg), SpO2 97.00%.  Labs: Lab Results  Component Value Date   WBC 6.8 05/10/2011   HGB 15.7 05/10/2011   HCT 47.2 05/10/2011   MCV 85.2 05/10/2011   PLT 159 05/10/2011    Lab 05/08/11 2005  NA 137  K 4.0  CL 106  CO2 22  BUN 16  CREATININE 1.01  CALCIUM 9.0  PROT --  BILITOT --  ALKPHOS --  ALT --  AST --  GLUCOSE 105*   Results for RALPHAEL, SOUTHGATE (MRN [id number]) as of 05/11/2011 10:38  Ref. Range 05/07/2011 21:35 05/08/2011 07:05 05/08/2011 11:54 05/08/2011 18:59  CK, MB Latest Range: 0.3-4.0 ng/mL 13.7 (HH) 12.5 (HH) 9.5 (HH) 7.7 (HH)  CK Total Latest Range: 7-232 U/L 506 (H) 418 (H) 361 (  H) 335 (H)  Troponin I Latest Range: <0.30 ng/mL 1.62 (HH) 3.56 (HH) 1.32 (HH) 0.58 (HH)    Diagnostic Studies/Procedures   1.Chest Portable 1 View 05/07/2011  *RADIOLOGY REPORT*  Clinical Data: 66 year old male with tachycardia, defibrillator fired.  PORTABLE CHEST - 1 VIEW  Comparison: 02/20/2009.  Findings: AP portable semi upright view at 2142 hours.  Lower lung volumes.  Stable cardiac size and mediastinal contours.  Left chest cardiac AICD again noted.  No pneumothorax, pulmonary edema or pleural  effusion.  Streaky retrocardiac opacity most resembles atelectasis.  IMPRESSION: Low lung volumes, otherwise no acute cardiopulmonary abnormality.  Original Report Authenticated By: Harley Hallmark, M.D.   2. 2D Echo Study Conclusions 05/11/11  - Left ventricle: The cavity size was normal. Wall thickness was normal. Systolic function was mildly reduced. The estimated ejection fraction was 45%. Diffuse hypokinesis. Features are consistent with a pseudonormal left ventricular filling pattern, with concomitant abnormal relaxation and increased filling pressure (grade 2 diastolic dysfunction).   - Aortic valve: There was no stenosis. - Aorta: Mildly dilated aortic root. Aortic root dimension: 42mm (ED). - Mitral valve: No significant regurgitation. - Left atrium: The atrium was mildly dilated. - Right ventricle: Poorly visualized. The cavity size was mildly to moderately dilated. Pacer wire or catheter noted in right ventricle. Systolic function was mildly reduced. The apex of the RV appears more hypokinetic than the base. - Pulmonary arteries: PA peak pressure: 27mm Hg (S).  Impressions: - Normal LV size with global hypokinesis, EF 45%. The RV appeared mild to moderately dilated with mildly decreased systolic function. The basal RV appeared to function better than the apex. This could be consistent with ARVD. Would be concerned for LV involvement with decreased LV EF.   Discharge Medications   Medication List  As of 05/11/2011 10:36 AM   STOP taking these medications         aspirin 325 MG tablet      vitamin E 400 UNIT capsule         TAKE these medications         amiodarone 200 MG tablet   Commonly known as: PACERONE   Take 2 tablets twice a day for 1 week. Then decrease to 1 tablet twice a day for 3 weeks. Then decrease to 1 tablet daily thereafter.      CAL-MAG-ZINC PO   Take 1 tablet by mouth daily.      dabigatran 150 MG Caps   Commonly known as: PRADAXA   Take 1 capsule (150  mg total) by mouth every 12 (twelve) hours.      Fish Oil 1000 MG Caps   Take by mouth daily.      fluticasone 50 MCG/ACT nasal spray   Commonly known as: FLONASE   Place 2 sprays into the nose daily as needed. For congestion      metoprolol tartrate 25 MG tablet   Commonly known as: LOPRESSOR   Take 75 mg by mouth 2 (two) times daily.      vitamin C 500 MG tablet   Commonly known as: ASCORBIC ACID   Take 500 mg by mouth daily.            Disposition   The patient will be discharged in stable condition to home. Discharge Orders    Future Appointments: Provider: Department: Dept Phone: Center:   05/22/2011 1:00 PM [facility] [phone] None   06/18/2011 10:00 AM [facility] [place] [phone]  [facility]   06/25/2011 2:45 PM Gardiner Rhyme, MD [facility] [phone] [facility]   08/10/2011 10:30 AM Sher[facility] Flow [facility]  None     Future Orders Please Complete By Expires   Diet - low sodium heart healthy      Increase activity slowly      Comments:   No driving for 6 months.     Follow-up Information    Follow up with Hillis Range, MD. (06/18/11 at 2:45pm)    Contact information:   [address] [place] [address] [phone]       Follow up with [facility]. (For baseline lung function tests 05/22/11 at 1pm. Please arrive 10 minutes before. This is to establish a baseline before starting your new home medicine, amiodarone.)    Contact information:   [address] [place], [place] [address] Phone: [phone]       Follow up with [facility]. (Echocardiogram (heart ultrasound) on 08/10/11 at 10:30am)    Contact information:   [address] [place] [address]            Duration of Discharge Encounter: Greater than 30 minutes including physician and PA time.  Signed, Ronie Spies PA-C 05/11/2011, 10:36 AM  I  have seen, examined the patient, and reviewed the above assessment and plan.   Co Sign: Hillis Range, MD 05/11/2011 10:19 PM

## 2011-05-11 NOTE — Telephone Encounter (Signed)
New Refill   Patient is having a problem getting Pradaxa Rx filled, verified pharmacy info correct, patient can be reached at [phone] for additional info.     He reports [facility] has faxed Rx Authorization.  Patient needs RX for today.

## 2011-05-11 NOTE — Telephone Encounter (Signed)
Follow-up:     Patient called back again wanting to speak with someone about his prescriptions form the hospital that he needs filled. Please call back.

## 2011-05-11 NOTE — Progress Notes (Signed)
SUBJECTIVE: The patient is doing well today.  At this time, he denies chest pain, shortness of breath, or any new concerns. No further arrhythmias.  He wants to go home.    [name] amiodarone  400 mg Oral BID  . dabigatran  150 mg Oral Q12H  . metoprolol tartrate  75 mg Oral BID  . sodium chloride  3 mL Intravenous Q12H  . DISCONTD: metoprolol tartrate  50 mg Oral Q6H      OBJECTIVE: Physical Exam: Filed Vitals:   05/10/11 1300 05/10/11 2100 05/10/11 2204 05/11/11 0600  BP: 123/74 115/65  114/67  Pulse: 58 64 71 58  Temp: 97.6 F (36.4 C) 99 F (37.2 C)  98.3 F (36.8 C)  TempSrc: Oral     Resp: 20 18  18   Height:      Weight:      SpO2: 98% 97%  97%    Intake/Output Summary (Last 24 hours) at 05/11/11 0837 Last data filed at 05/10/11 1700  Gross per 24 hour  Intake    240 ml  Output      0 ml  Net    240 ml    Telemetry reveals sinus rhythm  GEN- The patient is well appearing, alert and oriented x 3 today.   Head- normocephalic, atraumatic Eyes-  Sclera clear, conjunctiva pink Ears- hearing intact Oropharynx- clear Neck- supple, no JVP Lymph- no cervical lymphadenopathy Lungs- Clear to ausculation bilaterally, normal work of breathing Heart- Regular rate and rhythm, no murmurs, rubs or gallops, PMI not laterally displaced GI- soft, NT, ND, + BS Extremities- no clubbing, cyanosis, or edema Skin- no rash or lesion Psych- euthymic mood, full affect Neuro- strength and sensation are intact  LABS: Basic Metabolic Panel:  Basename 05/08/11 2005 05/08/11 1154  NA 137 135  K 4.0 4.0  CL 106 106  CO2 22 22  GLUCOSE 105* 112*  BUN 16 15  CREATININE 1.01 0.95  CALCIUM 9.0 8.9  MG -- --  PHOS -- --   Liver Function Tests: No results found for this basename: AST:2,ALT:2,ALKPHOS:2,BILITOT:2,PROT:2,ALBUMIN:2 in the last 72 hours No results found for this basename: LIPASE:2,AMYLASE:2 in the last 72 hours CBC:  Basename 05/10/11 0549 05/09/11 0500  WBC 6.8 6.5    NEUTROABS -- --  HGB 15.7 15.4  HCT 47.2 45.0  MCV 85.2 84.7  PLT 159 154   Cardiac Enzymes:  Basename 05/08/11 1859 05/08/11 1154  CKTOTAL 335* 361*  CKMB 7.7* 9.5*  CKMBINDEX -- --  TROPONINI 0.58* 1.32*    ASSESSMENT AND PLAN:  1. VT- he has a h/o ARVD with prior VT Recent ICD shocks were for afib and VT, pt has failed metoprolol and is now on amiodarone doing much better Amiodarone 400mg  BID x 1 week, then 200mg  BID x 3 weeks, then 200mg  daily thereafter. Follow-up with me in 6 weeks.  2.  Afib/ atrial flutter Now in sinus Po amiodarone as above Pradaxa 150mg  BID   3.  Reduced EF LV EF 45% this admit.  This may represent ventricular stunning s/p 9 ICD shocks.   Will not start an ace inhibitor at this time as BP is low at times. Will repeat echo as an outpatient in 3 months.  4.  Elevated Tn No ischemic symptoms Elevated troponin likely due to multiple ICD shocks delivered  DC to home today.   No driving for 6 months (I have informed pt of this).  Follow-up with me in 6 weeks.  Hillis Range,  MD 05/11/2011 8:37 AM

## 2011-05-20 ENCOUNTER — Telehealth: Payer: Self-pay | Admitting: Internal Medicine

## 2011-05-20 NOTE — Telephone Encounter (Signed)
New msg Pt called and said he has a question about pradaxa. He also said that this med has not been approved to be filled at [facility] please call

## 2011-05-20 NOTE — Telephone Encounter (Signed)
Pradaxa 150mg  bid ok to fill   PA # is [id number]

## 2011-05-22 ENCOUNTER — Ambulatory Visit (INDEPENDENT_AMBULATORY_CARE_PROVIDER_SITE_OTHER): Payer: Medicare Other | Admitting: Internal Medicine

## 2011-05-22 DIAGNOSIS — Z9229 Personal history of other drug therapy: Secondary | ICD-10-CM

## 2011-05-22 DIAGNOSIS — Z09 Encounter for follow-up examination after completed treatment for conditions other than malignant neoplasm: Secondary | ICD-10-CM | POA: Diagnosis not present

## 2011-05-22 LAB — PULMONARY FUNCTION TEST

## 2011-05-22 NOTE — Progress Notes (Signed)
PFT done today. 

## 2011-05-28 ENCOUNTER — Encounter: Payer: Self-pay | Admitting: Internal Medicine

## 2011-06-02 ENCOUNTER — Telehealth: Payer: Self-pay | Admitting: Internal Medicine

## 2011-06-02 NOTE — Telephone Encounter (Signed)
Please return call to patient at [phone]  Patient has been experiencing some heart rhythm issues and anxiety, which he associates with his defib-device since yesterday.  NO SOB or dizziness at this point.

## 2011-06-02 NOTE — Telephone Encounter (Signed)
I have asked pt to contact his PCP Dr Mila Palmer with [facility] for a follow up with his anxiety and we are seeing him tomorrow for his device to see how much VT if any he has had

## 2011-06-02 NOTE — Telephone Encounter (Signed)
Thinks he has been having events vs panic attacks.

## 2011-06-03 ENCOUNTER — Ambulatory Visit (INDEPENDENT_AMBULATORY_CARE_PROVIDER_SITE_OTHER): Payer: Medicare Other | Admitting: *Deleted

## 2011-06-03 ENCOUNTER — Encounter: Payer: Self-pay | Admitting: Internal Medicine

## 2011-06-03 DIAGNOSIS — I472 Ventricular tachycardia: Secondary | ICD-10-CM

## 2011-06-03 DIAGNOSIS — I4891 Unspecified atrial fibrillation: Secondary | ICD-10-CM

## 2011-06-03 LAB — ICD DEVICE OBSERVATION
AL AMPLITUDE: 3 mv
BAMS-0001: 170 {beats}/min
DEVICE MODEL ICD: 746606
FVT: 0
HV IMPEDENCE: 47 Ohm
MODE SWITCH EPISODES: 0
PACEART VT: 0
RV LEAD AMPLITUDE: 5.6 mv
TOT-0010: 66
TZAT-0004SLOWVT: 8
TZAT-0012SLOWVT: 200 ms
TZAT-0013SLOWVT: 3
TZAT-0018SLOWVT: NEGATIVE
TZAT-0019SLOWVT: 7.5 V
TZAT-0020SLOWVT: 1 ms
TZON-0003SLOWVT: 350 ms
TZST-0001SLOWVT: 4
TZST-0003SLOWVT: 30 J
VENTRICULAR PACING ICD: 2.6 pct
VF: 9

## 2011-06-03 NOTE — Progress Notes (Signed)
ICD interrogation only 

## 2011-06-04 ENCOUNTER — Telehealth: Payer: Self-pay | Admitting: Internal Medicine

## 2011-06-04 NOTE — Telephone Encounter (Signed)
LOV,PFT faxed to [facility] @ [phone] 06/04/11/KM

## 2011-06-05 DIAGNOSIS — F411 Generalized anxiety disorder: Secondary | ICD-10-CM | POA: Diagnosis not present

## 2011-06-17 ENCOUNTER — Telehealth: Payer: Self-pay | Admitting: Internal Medicine

## 2011-06-17 NOTE — Telephone Encounter (Signed)
Patient having tremendous anxiety problems.  Just very anxious and hyper feeling.  When he sits and looks at computer he gets very nervous and feels like he needs to go out and walk.  He feels best when he's up moving around but doing so he is not able to get much work done.  He is checking his BP and pulse rate at gym is 44-50  His usual is 60 after exercising.  Not sleeping well at all  He sleeps then wakes up and is wide awake for 3 hours  Let him know I would discuss with Dr Johney Frame tomorrow and call him back  I don't

## 2011-06-17 NOTE — Telephone Encounter (Signed)
Pt has questions regarding his medication amiodarone, and prodaxa  and he thinks he is having a reaction to it because he is experiencing  light headed, can't focus, tingling among other symptoms for about 3hrs after taking meds and it makes it hard to work the only thing that helps him get over the feelings is to walk and/or exercise and it also is causing a problem with sleeping

## 2011-06-18 NOTE — Telephone Encounter (Signed)
Dr Zollie Beckers gave him something like Xanax to take 1/2 -1 three times daily as needed  He is also using breathing techniques to try and help with his anxiety  i have advised him to keep his follow up as scheduled and this should all get better in time

## 2011-06-24 DIAGNOSIS — F4001 Agoraphobia with panic disorder: Secondary | ICD-10-CM | POA: Diagnosis not present

## 2011-06-25 ENCOUNTER — Encounter: Payer: Self-pay | Admitting: Internal Medicine

## 2011-06-25 ENCOUNTER — Ambulatory Visit (INDEPENDENT_AMBULATORY_CARE_PROVIDER_SITE_OTHER): Payer: Medicare Other | Admitting: Internal Medicine

## 2011-06-25 VITALS — BP 145/86 | HR 59 | Resp 18 | Ht 74.0 in | Wt 210.8 lb

## 2011-06-25 DIAGNOSIS — I472 Ventricular tachycardia: Secondary | ICD-10-CM | POA: Diagnosis not present

## 2011-06-25 DIAGNOSIS — Q249 Congenital malformation of heart, unspecified: Secondary | ICD-10-CM

## 2011-06-25 DIAGNOSIS — I4891 Unspecified atrial fibrillation: Secondary | ICD-10-CM

## 2011-06-25 LAB — TSH: TSH: 0.807 u[IU]/mL (ref 0.350–4.500)

## 2011-06-25 LAB — HEPATIC FUNCTION PANEL
AST: 27 U/L (ref 0–37)
Albumin: 4.1 g/dL (ref 3.5–5.2)
Alkaline Phosphatase: 74 U/L (ref 39–117)
Indirect Bilirubin: 0.5 mg/dL (ref 0.0–0.9)
Total Bilirubin: 0.6 mg/dL (ref 0.3–1.2)

## 2011-06-25 LAB — ICD DEVICE OBSERVATION
AL AMPLITUDE: 2.7 mv
AL IMPEDENCE ICD: 400 Ohm
ATRIAL PACING ICD: 2.1 pct
BAMS-0001: 170 {beats}/min
CHARGE TIME: 11.3 s
DEV-0020ICD: NEGATIVE
TOT-0006: 20101123000000
TOT-0007: 4
TOT-0008: 0
TZAT-0004SLOWVT: 8
TZAT-0012SLOWVT: 200 ms
TZAT-0013SLOWVT: 3
TZAT-0018SLOWVT: NEGATIVE
TZAT-0019SLOWVT: 7.5 V
TZAT-0020SLOWVT: 1 ms
TZON-0003SLOWVT: 350 ms
TZON-0004SLOWVT: 60
TZON-0005SLOWVT: 6
TZST-0001SLOWVT: 3
TZST-0001SLOWVT: 4
TZST-0003SLOWVT: 40 J
VENTRICULAR PACING ICD: 2.3 pct
VF: 0

## 2011-06-25 LAB — BASIC METABOLIC PANEL
BUN: 25 mg/dL — ABNORMAL HIGH (ref 6–23)
CO2: 24 mEq/L (ref 19–32)
Calcium: 9 mg/dL (ref 8.4–10.5)
Chloride: 105 mEq/L (ref 96–112)
Creat: 1.19 mg/dL (ref 0.50–1.35)
Glucose, Bld: 113 mg/dL — ABNORMAL HIGH (ref 70–99)

## 2011-06-25 LAB — CBC WITH DIFFERENTIAL/PLATELET
Basophils Absolute: 0 10*3/uL (ref 0.0–0.1)
Eosinophils Relative: 3 % (ref 0–5)
HCT: 46.4 % (ref 39.0–52.0)
Hemoglobin: 15 g/dL (ref 13.0–17.0)
Lymphocytes Relative: 16 % (ref 12–46)
MCHC: 32.3 g/dL (ref 30.0–36.0)
MCV: 87.9 fL (ref 78.0–100.0)
Monocytes Absolute: 0.8 10*3/uL (ref 0.1–1.0)
Monocytes Relative: 10 % (ref 3–12)
Neutro Abs: 5.5 10*3/uL (ref 1.7–7.7)
RDW: 14 % (ref 11.5–15.5)

## 2011-06-25 NOTE — Patient Instructions (Signed)
Your physician recommends that you schedule a follow-up appointment in: 3 months in the device clinic and 6 months with Dr Johney Frame  Your physician recommends that you return for lab work today

## 2011-06-30 DIAGNOSIS — F4001 Agoraphobia with panic disorder: Secondary | ICD-10-CM | POA: Diagnosis not present

## 2011-07-05 ENCOUNTER — Encounter: Payer: Self-pay | Admitting: Internal Medicine

## 2011-07-05 NOTE — Assessment & Plan Note (Signed)
Stable Continue pradaxa and amiodarone Consider ablation if further episodes occur

## 2011-07-05 NOTE — Assessment & Plan Note (Signed)
As above.

## 2011-07-05 NOTE — Assessment & Plan Note (Signed)
Normal ICD function See Arita Miss Art report No changes today  Recently hospitalized for ICD shocks with both afib and VT. His rhythm has been quiescent with amiodarone. Check LFTs and TFTs today No changes

## 2011-07-05 NOTE — Progress Notes (Signed)
PCP:  Emeterio Reeve, MD, MD  The patient presents today for routine electrophysiology followup.  Since his recent hospitalization, the patient reports doing reasonably well.  He has significant anxiety following his prior ICD shocks.  He has had no further episodes and is tolerating amiodarone.  Today, he denies symptoms of palpitations, chest pain, shortness of breath, orthopnea, PND, lower extremity edema, dizziness, presyncope, syncope, or neurologic sequela.  The patient feels that he is tolerating medications without difficulties and is otherwise without complaint today.   Past Medical History  Diagnosis Date  . Ventricular tachycardia     ICD discharges 05/2011 for this & AF-RVR - started on amiodarone then.  . Arrhythmogenic right ventricular dysplasia     Hx of VT s/p dual-chamber St. Jude ICD. Cardiac MRI 2010 showed moderately dilated RV with hypokinesis, LV EF 47%. EF 50-55% 2010 but down to 45% 05/2011. Cath 01/2009 without significant CAD.  [name] Atrial fibrillation or flutter     Paroxysmal. Started on Pradaxa 05/2011.  . Cardiac defibrillator in place   . Anxiety    Past Surgical History  Procedure Date  . Appendectomy   . Cardiac defibrillator placement 02/19/09     St,. Jude     Current Outpatient Prescriptions  Medication Sig Dispense Refill  . amiodarone (PACERONE) 200 MG tablet BY MOUTH: Take 2 tablets twice a day for 1 week. Then decrease to 1 tablet twice a day for 3 weeks. Then decrease to 1 tablet daily thereafter.  70 tablet  1  . Calcium-Magnesium-Zinc (CAL-MAG-ZINC PO) Take 1 tablet by mouth daily.       . dabigatran (PRADAXA) 150 MG CAPS Take 1 capsule (150 mg total) by mouth every 12 (twelve) hours.  60 capsule  6  . fluticasone (FLONASE) 50 MCG/ACT nasal spray Place 2 sprays into the nose daily as needed. For congestion      . metoprolol tartrate (LOPRESSOR) 25 MG tablet Take 75 mg by mouth 2 (two) times daily.      . Omega-3 Fatty Acids (FISH OIL) 1000 MG CAPS  Take by mouth daily.        . vitamin C (ASCORBIC ACID) 500 MG tablet Take 500 mg by mouth daily.          No Known Allergies  History   Social History  . Marital Status: Divorced    Spouse Name: N/A    Number of Children: N/A  . Years of Education: N/A   Occupational History  . Not on file.   Social History Main Topics  . Smoking status: Never Smoker   . Smokeless tobacco: Not on file  . Alcohol Use: Yes     social  . Drug Use: No  . Sexually Active: Yes    Birth Control/ Protection: Coitus interruptus   Other Topics Concern  . Not on file   Social History Narrative   Divorced w 1 child. Self employed.     Family History  Problem Relation Age of Onset  . Heart attack Father     Physical Exam: Filed Vitals:   06/25/11 1449  BP: 145/86  Pulse: 59  Resp: 18  Height: 6\' 2"  (1.88 m)  Weight: 210 lb 12.8 oz (95.618 kg)    GEN- The patient is well appearing, alert and oriented x 3 today.   Head- normocephalic, atraumatic Eyes-  Sclera clear, conjunctiva pink Ears- hearing intact Oropharynx- clear Neck- supple, no JVP Lymph- no cervical lymphadenopathy Lungs- Clear to ausculation bilaterally, normal  work of breathing Chest- ICD pocket is well healed Heart- Regular rate and rhythm, no murmurs, rubs or gallops, PMI not laterally displaced GI- soft, NT, ND, + BS Extremities- no clubbing, cyanosis, or edema MS- no significant deformity or atrophy Skin- no rash or lesion Psych- anxious mood, full affect Neuro- strength and sensation are intact  ICD interrogation- reviewed in detail today,  See PACEART report  Assessment and Plan:

## 2011-07-10 ENCOUNTER — Telehealth: Payer: Self-pay | Admitting: Internal Medicine

## 2011-07-10 DIAGNOSIS — F4001 Agoraphobia with panic disorder: Secondary | ICD-10-CM | POA: Diagnosis not present

## 2011-07-10 NOTE — Telephone Encounter (Signed)
New msg Pt said he is taking generic for lexapro. He wants to talk to you about side effects of this med and anxiety.

## 2011-07-10 NOTE — Telephone Encounter (Signed)
Patient states that his PCP had prescribed a genetic medication for Lexapro which the nurse said one of the side effects is Palpitations. Patient states that Palpitations is his main problem, that is why he gets anxious. Patient would like to know if Dr. Hillery Jacks approves for him to taken this medication considering the side effects.

## 2011-07-13 NOTE — Telephone Encounter (Signed)
Called patient and let him know  I spoke with Dr Johney Frame Lexapro is reasonable to try.  He has been taking this medication for 4 days and feels okay.

## 2011-07-23 DIAGNOSIS — R42 Dizziness and giddiness: Secondary | ICD-10-CM | POA: Diagnosis not present

## 2011-07-23 DIAGNOSIS — F411 Generalized anxiety disorder: Secondary | ICD-10-CM | POA: Diagnosis not present

## 2011-07-23 DIAGNOSIS — G479 Sleep disorder, unspecified: Secondary | ICD-10-CM | POA: Diagnosis not present

## 2011-07-24 ENCOUNTER — Other Ambulatory Visit: Payer: Self-pay

## 2011-08-07 ENCOUNTER — Other Ambulatory Visit: Payer: Self-pay | Admitting: Internal Medicine

## 2011-08-07 DIAGNOSIS — F4001 Agoraphobia with panic disorder: Secondary | ICD-10-CM | POA: Diagnosis not present

## 2011-08-07 MED ORDER — AMIODARONE HCL 200 MG PO TABS: 200.0000 mg | ORAL_TABLET | Freq: Every day | ORAL | Status: DC

## 2011-08-07 NOTE — Telephone Encounter (Signed)
Pt is out of pills 

## 2011-08-10 ENCOUNTER — Ambulatory Visit (HOSPITAL_COMMUNITY): Payer: Medicare Other | Attending: Internal Medicine

## 2011-08-10 ENCOUNTER — Other Ambulatory Visit: Payer: Self-pay

## 2011-08-10 DIAGNOSIS — I38 Endocarditis, valve unspecified: Secondary | ICD-10-CM | POA: Diagnosis not present

## 2011-08-10 DIAGNOSIS — I423 Endomyocardial (eosinophilic) disease: Secondary | ICD-10-CM

## 2011-08-10 DIAGNOSIS — I517 Cardiomegaly: Secondary | ICD-10-CM | POA: Diagnosis not present

## 2011-08-10 DIAGNOSIS — I4891 Unspecified atrial fibrillation: Secondary | ICD-10-CM | POA: Diagnosis not present

## 2011-08-10 DIAGNOSIS — Q249 Congenital malformation of heart, unspecified: Secondary | ICD-10-CM | POA: Insufficient documentation

## 2011-08-11 ENCOUNTER — Other Ambulatory Visit: Payer: Self-pay | Admitting: *Deleted

## 2011-08-11 NOTE — Telephone Encounter (Signed)
Opened in Error.

## 2011-08-31 DIAGNOSIS — F4001 Agoraphobia with panic disorder: Secondary | ICD-10-CM | POA: Diagnosis not present

## 2011-09-07 DIAGNOSIS — F4001 Agoraphobia with panic disorder: Secondary | ICD-10-CM | POA: Diagnosis not present

## 2011-09-23 ENCOUNTER — Encounter: Payer: Self-pay | Admitting: Internal Medicine

## 2011-09-23 ENCOUNTER — Other Ambulatory Visit: Payer: Self-pay | Admitting: *Deleted

## 2011-09-23 ENCOUNTER — Ambulatory Visit (INDEPENDENT_AMBULATORY_CARE_PROVIDER_SITE_OTHER): Payer: Medicare Other | Admitting: *Deleted

## 2011-09-23 DIAGNOSIS — I472 Ventricular tachycardia: Secondary | ICD-10-CM | POA: Diagnosis not present

## 2011-09-23 LAB — ICD DEVICE OBSERVATION
AL AMPLITUDE: 2.2 mv
BAMS-0001: 170 {beats}/min
DEV-0020ICD: NEGATIVE
FVT: 0
MODE SWITCH EPISODES: 0
RV LEAD AMPLITUDE: 5.3 mv
RV LEAD THRESHOLD: 0.75 V
TOT-0008: 0
TZAT-0001SLOWVT: 1
TZAT-0012SLOWVT: 200 ms
TZAT-0020SLOWVT: 1 ms
TZON-0005SLOWVT: 6
TZON-0010SLOWVT: 80 ms
TZST-0001SLOWVT: 4
TZST-0003SLOWVT: 30 J
VENTRICULAR PACING ICD: 8.6 pct
VF: 0

## 2011-09-23 MED ORDER — METOPROLOL TARTRATE 25 MG PO TABS: 75.0000 mg | ORAL_TABLET | Freq: Two times a day (BID) | ORAL | Status: DC

## 2011-09-23 NOTE — Progress Notes (Signed)
ICD check 

## 2011-10-05 DIAGNOSIS — F4001 Agoraphobia with panic disorder: Secondary | ICD-10-CM | POA: Diagnosis not present

## 2011-10-07 ENCOUNTER — Other Ambulatory Visit: Payer: Self-pay

## 2011-10-07 MED ORDER — METOPROLOL TARTRATE 25 MG PO TABS: 75.0000 mg | ORAL_TABLET | Freq: Two times a day (BID) | ORAL | Status: DC

## 2011-10-07 NOTE — Telephone Encounter (Signed)
.   Requested Prescriptions   Signed Prescriptions Disp Refills  . metoprolol tartrate (LOPRESSOR) 25 MG tablet 180 tablet 1    Sig: Take 3 tablets (75 mg total) by mouth 2 (two) times daily.    Authorizing Provider: Hillis Range    Ordering User: Lacie Scotts

## 2011-10-27 ENCOUNTER — Encounter (HOSPITAL_COMMUNITY): Payer: Self-pay | Admitting: *Deleted

## 2011-10-27 ENCOUNTER — Emergency Department (HOSPITAL_COMMUNITY): Payer: Medicare Other

## 2011-10-27 ENCOUNTER — Inpatient Hospital Stay (HOSPITAL_COMMUNITY)
Admission: EM | Admit: 2011-10-27 | Discharge: 2011-11-02 | DRG: 871 | Disposition: A | Discharge status: Home / Self Care | Payer: Medicare Other | Attending: Internal Medicine | Admitting: Internal Medicine

## 2011-10-27 DIAGNOSIS — I959 Hypotension, unspecified: Secondary | ICD-10-CM | POA: Diagnosis not present

## 2011-10-27 DIAGNOSIS — I4891 Unspecified atrial fibrillation: Secondary | ICD-10-CM | POA: Diagnosis present

## 2011-10-27 DIAGNOSIS — R6521 Severe sepsis with septic shock: Secondary | ICD-10-CM

## 2011-10-27 DIAGNOSIS — Z95810 Presence of automatic (implantable) cardiac defibrillator: Secondary | ICD-10-CM | POA: Diagnosis not present

## 2011-10-27 DIAGNOSIS — E861 Hypovolemia: Secondary | ICD-10-CM | POA: Diagnosis present

## 2011-10-27 DIAGNOSIS — E2749 Other adrenocortical insufficiency: Secondary | ICD-10-CM | POA: Diagnosis present

## 2011-10-27 DIAGNOSIS — J9600 Acute respiratory failure, unspecified whether with hypoxia or hypercapnia: Secondary | ICD-10-CM | POA: Diagnosis present

## 2011-10-27 DIAGNOSIS — R5381 Other malaise: Secondary | ICD-10-CM | POA: Diagnosis not present

## 2011-10-27 DIAGNOSIS — R6889 Other general symptoms and signs: Secondary | ICD-10-CM | POA: Diagnosis not present

## 2011-10-27 DIAGNOSIS — Z85828 Personal history of other malignant neoplasm of skin: Secondary | ICD-10-CM

## 2011-10-27 DIAGNOSIS — E860 Dehydration: Secondary | ICD-10-CM | POA: Diagnosis not present

## 2011-10-27 DIAGNOSIS — I369 Nonrheumatic tricuspid valve disorder, unspecified: Secondary | ICD-10-CM | POA: Diagnosis not present

## 2011-10-27 DIAGNOSIS — D696 Thrombocytopenia, unspecified: Secondary | ICD-10-CM | POA: Diagnosis present

## 2011-10-27 DIAGNOSIS — L03221 Cellulitis of neck: Secondary | ICD-10-CM | POA: Diagnosis not present

## 2011-10-27 DIAGNOSIS — J9601 Acute respiratory failure with hypoxia: Secondary | ICD-10-CM

## 2011-10-27 DIAGNOSIS — R001 Bradycardia, unspecified: Secondary | ICD-10-CM

## 2011-10-27 DIAGNOSIS — R918 Other nonspecific abnormal finding of lung field: Secondary | ICD-10-CM | POA: Diagnosis not present

## 2011-10-27 DIAGNOSIS — R5383 Other fatigue: Secondary | ICD-10-CM | POA: Diagnosis not present

## 2011-10-27 DIAGNOSIS — A419 Sepsis, unspecified organism: Secondary | ICD-10-CM | POA: Diagnosis not present

## 2011-10-27 DIAGNOSIS — J984 Other disorders of lung: Secondary | ICD-10-CM | POA: Diagnosis not present

## 2011-10-27 DIAGNOSIS — R55 Syncope and collapse: Secondary | ICD-10-CM

## 2011-10-27 DIAGNOSIS — I517 Cardiomegaly: Secondary | ICD-10-CM | POA: Diagnosis not present

## 2011-10-27 DIAGNOSIS — N179 Acute kidney failure, unspecified: Secondary | ICD-10-CM | POA: Diagnosis not present

## 2011-10-27 DIAGNOSIS — J189 Pneumonia, unspecified organism: Secondary | ICD-10-CM

## 2011-10-27 DIAGNOSIS — I472 Ventricular tachycardia: Secondary | ICD-10-CM

## 2011-10-27 DIAGNOSIS — L0211 Cutaneous abscess of neck: Secondary | ICD-10-CM | POA: Diagnosis not present

## 2011-10-27 DIAGNOSIS — J90 Pleural effusion, not elsewhere classified: Secondary | ICD-10-CM | POA: Diagnosis not present

## 2011-10-27 DIAGNOSIS — R6520 Severe sepsis without septic shock: Secondary | ICD-10-CM | POA: Diagnosis present

## 2011-10-27 DIAGNOSIS — I498 Other specified cardiac arrhythmias: Secondary | ICD-10-CM | POA: Diagnosis present

## 2011-10-27 DIAGNOSIS — Q249 Congenital malformation of heart, unspecified: Secondary | ICD-10-CM | POA: Diagnosis not present

## 2011-10-27 HISTORY — DX: Sepsis, unspecified organism: A41.9

## 2011-10-27 LAB — COMPREHENSIVE METABOLIC PANEL
Albumin: 2.6 g/dL — ABNORMAL LOW (ref 3.5–5.2)
BUN: 26 mg/dL — ABNORMAL HIGH (ref 6–23)
Calcium: 7.6 mg/dL — ABNORMAL LOW (ref 8.4–10.5)
Creatinine, Ser: 1.6 mg/dL — ABNORMAL HIGH (ref 0.50–1.35)
GFR calc Af Amer: 50 mL/min — ABNORMAL LOW (ref >90)
Glucose, Bld: 112 mg/dL — ABNORMAL HIGH (ref 70–99)
Total Protein: 5.3 g/dL — ABNORMAL LOW (ref 6.0–8.3)

## 2011-10-27 LAB — CBC WITH DIFFERENTIAL/PLATELET
Basophils Relative: 0 % (ref 0–1)
Eosinophils Absolute: 0 10*3/uL (ref 0.0–0.7)
Eosinophils Relative: 0 % (ref 0–5)
Lymphs Abs: 0.4 10*3/uL — ABNORMAL LOW (ref 0.7–4.0)
MCH: 29.4 pg (ref 26.0–34.0)
MCHC: 33.6 g/dL (ref 30.0–36.0)
MCV: 87.5 fL (ref 78.0–100.0)
Monocytes Relative: 7 % (ref 3–12)
Neutrophils Relative %: 88 % — ABNORMAL HIGH (ref 43–77)
WBC: 7.7 10*3/uL (ref 4.0–10.5)

## 2011-10-27 LAB — PROCALCITONIN: Procalcitonin: 17.08 ng/mL

## 2011-10-27 MED ORDER — SODIUM CHLORIDE 0.9 % IV SOLN: INTRAVENOUS | Status: DC

## 2011-10-27 MED ORDER — ONDANSETRON HCL 4 MG/2ML IJ SOLN
INTRAMUSCULAR | Status: AC
  Administered 2011-10-27: 20:00:00
  Filled 2011-10-27: qty 2

## 2011-10-27 MED ORDER — DEXTROSE 5 % IV SOLN
500.0000 mg | Freq: Once | INTRAVENOUS | Status: AC
  Administered 2011-10-27: 500 mg via INTRAVENOUS
  Filled 2011-10-27: qty 500

## 2011-10-27 MED ORDER — DEXTROSE 5 % IV SOLN: 2.0000 g | INTRAVENOUS | Status: DC

## 2011-10-27 MED ORDER — ESCITALOPRAM OXALATE 20 MG PO TABS
20.0000 mg | ORAL_TABLET | Freq: Every day | ORAL | Status: DC
  Administered 2011-10-29 – 2011-11-02 (×5): 20 mg via ORAL
  Filled 2011-10-27 (×6): qty 1

## 2011-10-27 MED ORDER — LIDOCAINE HCL (PF) 1 % IJ SOLN
INTRAMUSCULAR | Status: AC
  Administered 2011-10-27: 2 mL
  Filled 2011-10-27: qty 5

## 2011-10-27 MED ORDER — SODIUM CHLORIDE 0.9 % IV BOLUS (SEPSIS)
1000.0000 mL | Freq: Once | INTRAVENOUS | Status: AC
  Administered 2011-10-27: 1000 mL via INTRAVENOUS

## 2011-10-27 MED ORDER — LEVOFLOXACIN IN D5W 750 MG/150ML IV SOLN
750.0000 mg | INTRAVENOUS | Status: DC
  Administered 2011-10-28: 750 mg via INTRAVENOUS
  Filled 2011-10-27: qty 150

## 2011-10-27 MED ORDER — ONDANSETRON HCL 4 MG/2ML IJ SOLN: 4.0000 mg | Freq: Four times a day (QID) | INTRAMUSCULAR | Status: DC | PRN

## 2011-10-27 MED ORDER — PROMETHAZINE HCL 25 MG/ML IJ SOLN
12.5000 mg | Freq: Four times a day (QID) | INTRAMUSCULAR | Status: DC | PRN
  Administered 2011-10-28: 12.5 mg via INTRAVENOUS
  Filled 2011-10-27 (×2): qty 1

## 2011-10-27 MED ORDER — PANTOPRAZOLE SODIUM 40 MG IV SOLR
40.0000 mg | INTRAVENOUS | Status: DC
  Administered 2011-10-28 (×2): 40 mg via INTRAVENOUS
  Filled 2011-10-27 (×3): qty 40

## 2011-10-27 MED ORDER — ALBUTEROL SULFATE HFA 108 (90 BASE) MCG/ACT IN AERS: 4.0000 | INHALATION_SPRAY | RESPIRATORY_TRACT | Status: DC | PRN

## 2011-10-27 MED ORDER — AMIODARONE HCL 200 MG PO TABS
200.0000 mg | ORAL_TABLET | Freq: Every day | ORAL | Status: DC
  Filled 2011-10-27: qty 1

## 2011-10-27 MED ORDER — DOPAMINE-DEXTROSE 3.2-5 MG/ML-% IV SOLN
2.0000 ug/kg/min | INTRAVENOUS | Status: DC
  Administered 2011-10-27: 5 ug/kg/min via INTRAVENOUS
  Filled 2011-10-27 (×2): qty 250

## 2011-10-27 MED ORDER — ONDANSETRON HCL 4 MG/2ML IJ SOLN
INTRAMUSCULAR | Status: AC
  Filled 2011-10-27: qty 2

## 2011-10-27 MED ORDER — DABIGATRAN ETEXILATE MESYLATE 150 MG PO CAPS
150.0000 mg | ORAL_CAPSULE | Freq: Two times a day (BID) | ORAL | Status: DC
  Administered 2011-10-28: 150 mg via ORAL
  Filled 2011-10-27: qty 1

## 2011-10-27 MED ORDER — CEFTRIAXONE SODIUM 1 G IJ SOLR
1.0000 g | Freq: Once | INTRAMUSCULAR | Status: AC
  Administered 2011-10-27: 1 g via INTRAMUSCULAR
  Filled 2011-10-27: qty 10

## 2011-10-27 MED ORDER — ATROPINE SULFATE 0.1 MG/ML IJ SOLN
INTRAMUSCULAR | Status: AC
  Administered 2011-10-27: 20:00:00
  Filled 2011-10-27: qty 10

## 2011-10-27 MED ORDER — SODIUM CHLORIDE 0.9 % IV SOLN: 250.0000 mL | INTRAVENOUS | Status: DC | PRN

## 2011-10-27 MED ORDER — SODIUM CHLORIDE 0.9 % IV BOLUS (SEPSIS)
2000.0000 mL | Freq: Once | INTRAVENOUS | Status: AC
  Administered 2011-10-27: 2000 mL via INTRAVENOUS

## 2011-10-27 MED ORDER — FLUTICASONE PROPIONATE 50 MCG/ACT NA SUSP
2.0000 | Freq: Every day | NASAL | Status: DC | PRN
  Filled 2011-10-27: qty 16

## 2011-10-27 NOTE — Consult Note (Signed)
ANTIBIOTIC CONSULT NOTE - INITIAL  Pharmacy Consult for Levaquin + Ceftriaxone Indication: CAP with sepsis  No Known Allergies  Patient Measurements: Height: 6\' 2"  (188 cm) Weight: 214 lb (97.07 kg) IBW/kg (Calculated) : 82.2   Vital Signs: Temp: 97.8 F (36.6 C) (07/30 1958) Temp src: Oral (07/30 1958) BP: 89/59 mmHg (07/30 2115) Pulse Rate: 111  (07/30 2115) Intake/Output from previous day:   Intake/Output from this shift:    Labs:  [facility] 10/27/11 1733  WBC 7.7  HGB 13.6  PLT 122*  LABCREA --  CREATININE 1.60*   Estimated Creatinine Clearance: 52.8 ml/min (by C-G formula based on Cr of 1.6).  Microbiology: No results found for this or any previous visit (from the past 720 hour(s)).  Medical History: Past Medical History  Diagnosis Date  . Ventricular tachycardia     ICD discharges 05/2011 for this & AF-RVR - started on amiodarone then.  . Arrhythmogenic right ventricular dysplasia     Hx of VT s/p dual-chamber St. Jude ICD. Cardiac MRI 2010 showed moderately dilated RV with hypokinesis, LV EF 47%. EF 50-55% 2010 but down to 45% 05/2011. Cath 01/2009 without significant CAD.  [name] Atrial fibrillation or flutter     Paroxysmal. Started on Pradaxa 05/2011.  . Cardiac defibrillator in place   . Anxiety    Assessment: 65yom presents to ED hypotensive. Was febrile with chills overnight.  CXR concerning for pneumonia.  His lactic acid and PCT are also elevated.  He will begin levaquin and ceftriaxone for CAP with associated sepsis.  He has no history of renal insufficiency but appears to be in acute renal failure with sCr elevated at 1.6. Received ceftriaxone 1g and azithromycin 500mg  in the ED.  Goal of Therapy:  Appropriate antibiotic dosing  Plan:  1) Levaquin 750mg  IV q24 2) Ceftriaxone 1g IV q24 3) Follow renal function, cultures, clinical status  Fredrik Rigger 10/27/2011,9:43 PM

## 2011-10-27 NOTE — Progress Notes (Deleted)
Name: Robert Ochoa MRN: [id number] DOB: [birth year]    LOS: 0  Referring Provider:  EDP Reason for Referral:  Suspected sepsis  PULMONARY / CRITICAL CARE MEDICINE  HPI:  66 year old male with a history of ARVD s/p dual-chamber [employer] Jude ICD presenting with shortness of breath and cough.   Patient states over last month he has had a "different cough". At baseline he has a cough related to his allergic rhinitis. Last night, patient got very feverish and chilled during his sleep associated with fatigue. Fever in AM to 101.4 after most severe portion of overnight illness had resolved.  He also has been more short of breath over the last day. He also felt faint frequently earlier today and patient reports fainting once before going to PCP. Patient presented to PCP today and was noted to have a BP of 76/46 so was sent to ED.   In ED, patient initially with blood pressure of 104/74 which subsequently dropped with MAPs <60 requiring fluid boluses to maintain MAP >60. CXR showed RML pneumonia so patient started on ceftriaxone and azithromycin for CAP. Patient also had a symptomatic bradycardic event into 30s-40s with associated hypotension which responded to 1 mg atropine. PCCM consulted for admission and cardiology called for consult.   Past Medical History  Diagnosis Date  . Ventricular tachycardia     ICD discharges 05/2011 for this & AF-RVR - started on amiodarone then.  . Arrhythmogenic right ventricular dysplasia     Hx of VT s/p dual-chamber St. Jude ICD. Cardiac MRI 2010 showed moderately dilated RV with hypokinesis, LV EF 47%. EF 50-55% 2010 but down to 45% 05/2011. Cath 01/2009 without significant CAD.  [name] Atrial fibrillation or flutter     Paroxysmal. Started on Pradaxa 05/2011.  . Cardiac defibrillator in place   . Anxiety    Past Surgical History  Procedure Date  . Appendectomy   . Cardiac defibrillator placement 02/19/09     St,. Jude    Prior to Admission medications   Medication  Sig Start Date End Date Taking? Authorizing Provider  amiodarone (PACERONE) 200 MG tablet Take 1 tablet (200 mg total) by mouth daily. 08/07/11  Yes Hillis Range, MD  Calcium-Magnesium-Zinc (CAL-MAG-ZINC PO) Take 1 tablet by mouth daily.    Yes Historical Provider, MD  dabigatran (PRADAXA) 150 MG CAPS Take 1 capsule (150 mg total) by mouth every 12 (twelve) hours. 05/11/11  Yes Dayna N Dunn, PA  escitalopram (LEXAPRO) 20 MG tablet Take 20 mg by mouth daily.   Yes Historical Provider, MD  fluticasone (FLONASE) 50 MCG/ACT nasal spray Place 2 sprays into the nose daily as needed. For congestion   Yes Historical Provider, MD  metoprolol tartrate (LOPRESSOR) 25 MG tablet Take 3 tablets (75 mg total) by mouth 2 (two) times daily. 10/07/11 10/06/12 Yes Hillis Range, MD  Omega-3 Fatty Acids (FISH OIL) 1000 MG CAPS Take 3 capsules by mouth daily.    Yes Historical Provider, MD  vitamin C (ASCORBIC ACID) 500 MG tablet Take 1,000 mg by mouth daily.    Yes Historical Provider, MD   Allergies No Known Allergies  Family History Family History  Problem Relation Age of Onset  . Heart attack Father    Social History  reports that he has never smoked. He does not have any smokeless tobacco history on file. He reports that he drinks alcohol. He reports that he does not use illicit drugs.  Review Of Systems:  Positive for rhinorrhea   Brief  patient description:  66 year old male with a history of ARVD s/p dual-chamber St. Jude ICD presenting with sepsis likely due to RML pneumonia. Patient also with bradycardia in setting of sepsis.   Events Since Admission: 7/30 Admitted to ICU  Current Status: mentating well, maintaining oxygenation on nasal cannula   Vital Signs: Temp:  [97.8 F (36.6 C)] 97.8 F (36.6 C) (07/30 1958) Pulse Rate:  [57-93] 58  (07/30 2054) Resp:  [17-28] 24  (07/30 2054) BP: (75-104)/(56-90) 78/57 mmHg (07/30 2017) SpO2:  [88 %-99 %] 92 % (07/30 2054) Weight:  [214 lb (97.07 kg)] 214  lb (97.07 kg) (07/30 1958)  Physical Examination: General:  NAD, resting in bed, cooperative Neuro:  Grossly intact, AxO x3 HEENT:  Very dry mucus membranes, slight rhinorrhea Neck:  Supple, no JVD Cardiovascular:  Irregularly irregular, distant heart sounds Lungs:  Coarse breath sounds bilaterally R >L with some crackles noted in right base, tachypnea, minimal use of accessory muscles and no retractions Abdomen:  Soft, nontender, nondistended Musculoskeletal:  No edema, moves all extremities Skin:  Extremities slightly cool  Active Problems:  Atrial fibrillation  Right ventricular dysplasia  Sepsis  Community acquired pneumonia  Symptomatic bradycardia  Acute renal failure  ASSESSMENT AND PLAN  PULMONARY No results found for this basename: PHART:5,PCO2:5,PCO2ART:5,PO2ART:5,HCO3:5,O2SAT:5 in the last 168 hours Ventilator Settings: N/A   CXR:  7/30 >>> RML pneumonia ETT:  N/A  A:  Community acquired pneumonia. P:   Supplemental oxygen for SpO2 >92% CXR in AM  CARDIOVASCULAR  Lab 10/27/11 1742  TROPONINI --  LATICACIDVEN 5.0*  PROBNP --   ECG:  Sinus rhythm with possible first degree block (poor tracing).  Lines: NA  A: SIRS. Symptomatic bradycardia.  MAP's are consistently > 65.  AICD.  PAF. P:  Cardiology consult Repeat EKG  Hold home metoprolol and amiodarone Start Dopamine gtt to maintain HR > 70 Goal MAP >60 No indication for CVL at this time A-line as noninvasive BP measurements do not appear reliable Trend Lactate Trend Cardiac enzymes  RENAL  Lab 10/27/11 1733  NA 139  K 4.7  CL 107  CO2 19  BUN 26*  CREATININE 1.60*  CALCIUM 7.6*  MG --  PHOS --   Intake/Output    None    Foley:  NA  A:  AKI / Acute renal failure.  Hypovolemia. P:   Trend BMET IVF NS 100 mL/h  GASTROINTESTINAL  Lab 10/27/11 1733  AST 29  ALT 19  ALKPHOS 34*  BILITOT 1.5*  PROT 5.3*  ALBUMIN 2.6*    A:  Mildly elevated bilirubin, etiology is not  clear. P:   Trend LFT  HEMATOLOGIC  Lab 10/27/11 1733  HGB 13.6  HCT 40.5  PLT 122*  INR --  APTT --   A:  Thrombocytopenia. P:  Trend CBC Pradaxa  INFECTIOUS  Lab 10/27/11 1736 10/27/11 1733  WBC -- 7.7  PROCALCITON 17.08 --   Cultures: 7/30 Blood Cx >>>  Antibiotics: Azithromycin 7/30 x 1 Ceftriaxone 7/30 >>> Levaquin 7/30>>>  A:  Community acquired pneumonia with sepsis.  P:   Antibiotics and cultures as above Urine strep and legionella Ag  ENDOCRINE No results found for this basename: GLUCAP:5 in the last 168 hours A:  Concern adrenal insufficiency.    P:   Obtain cortisol level  NEUROLOGIC  A:  No acute issues P:   Monitor  BEST PRACTICE / DISPOSITION Level of Care:  ICU Primary Service:  PCCM Consultants:  Cardiology Code Status:  Full Diet:  Clear liquids DVT Px:  Not indicated (Pradaxa) GI Px:  Protonix Skin Integrity:  Intact Social / Family:  Not available  Aldine Contes. Marti Sleigh, MD, PGY2 10/27/2011 9:00 PM  Patient examined.  Records reviewed.  Assessment and plan above edited and discussed with ICU team. The patient is critically ill with multiple organ systems failure and requires high complexity decision making for assessment and support, frequent evaluation and titration of therapies, application of advanced monitoring technologies and extensive interpretation of multiple databases. Critical Care Time devoted to patient care services described in this note is 35 minutes.  Orlean Bradford, M.D., F.C.C.P. Pulmonary and Critical Care Medicine [facility] Cell: [phone] Pager: [phone]

## 2011-10-27 NOTE — ED Provider Notes (Signed)
66 year old male had chills and fever starting 1 AM today. Fever was up to 102 at home. Fever resolved but he had chills again this afternoon. He was not aware of any sore throat, cough, dyspnea, dysuria. He has urinated relatively small amount today and has had some nausea but without vomiting. He has not had any diarrhea. He recently returned from a trip to [place] but did not notice any ticks on him. He has had some sinus drainage for several weeks. He was dizzy and lightheaded today, and EMS noted he was hypotensive and started him on IV fluids. On exam, he is awake and alert and oriented. Lungs have rhonchi throughout the right lung with rales at the right base. Heart has regular rate and rhythm without murmur. Abdomen is soft, flat, nontender. There's no CVA tenderness. Chest x-ray and laboratory workup will be obtained and he will continue to get aggressive IV hydration.   Date: 10/27/2011  Rate: 63  Rhythm: normal sinus rhythm  QRS Axis: normal  Intervals: PR prolonged  ST/T Wave abnormalities: nonspecific T wave changes  Conduction Disutrbances:first-degree A-V block  and nonspecific intraventricular conduction delay  Narrative Interpretation: Low voltage, nonspecific intraventricular conduction delay, first degree AV block, nonspecific T wave flattening diffusely.. When compared with ECG of 05/10/2011, no significant changes are seen.  Old EKG Reviewed: unchanged    Robert Booze, MD 10/29/11 [phone]

## 2011-10-27 NOTE — ED Notes (Signed)
Lew Dawes, MD notified re: Seward Speck, MD @ bedside

## 2011-10-27 NOTE — Progress Notes (Signed)
Attempted Artline by Eloise Levels. RRT,RCP and Laray Anger CRT,RCP times two a piece twice in right and twice in left radial arteries. All procedure done per RT protocol. Good blood flow with all sticks but unable to thread the catheter in any attempt. Pt tolerated procedure well and MD aware.

## 2011-10-27 NOTE — ED Notes (Signed)
Hilary Hertz, MD with [facility] at bedside

## 2011-10-27 NOTE — ED Notes (Signed)
Lew Dawes, MD notified re: BP

## 2011-10-27 NOTE — ED Notes (Signed)
Pt given water PO, pt remains on cardiac monitor, will continue to monitor

## 2011-10-27 NOTE — Consult Note (Signed)
CARDIOLOGY CONSULT NOTE   Robert Ochoa MRN: [id number] DOB/AGE: [birth year] 66 y.o. Admit date: 10/27/2011  Primary Cardiologist: Dr. Johney Frame  Reason for consultation:  Bradycardia, question of device malfunction  HPI: Pt is a 66 yo man with ARVD, VT, AF s/p dual chamber ICD.  He presents with prod cough, fever, chills - found to have PNA and sepsis/septic shock.  Pt reports that he has not felt well for about a month and has had a nagging cough.  Last night, he started having chills and fevers.  This am, he felt dizzy when standing up and felt as if he was going to pass out and went to his PCP and was sent to the ED.  He denies syncope.  No LH prior to this episode.  He denies SOB, PND, orthopnea, LE edema, CP, ICD shocks.  He feels nauseous at this time, but has not vomited.  He has not been able to eat all day.  He also reports chronic abd pain that he believes is due to a hernia and feels better when he is sitting up in a chair.    While in the ED today, pt's monitor was noted to show HR in 30's per RN and pt reported nausea at the time and so was given atropine.  No tele strips were recorded of this event.  It is not known if pacing spikes were shown on the monitor.   Review of systems: A review of 10 organ systems was done and is negative except as stated above in HPI.  Specifically no focal weakness, n, t, rash.   Past Medical History  Diagnosis Date  . Ventricular tachycardia     ICD discharges 05/2011 for this & AF-RVR - started on amiodarone then.  . Arrhythmogenic right ventricular dysplasia     Hx of VT s/p dual-chamber St. Jude ICD. Cardiac MRI 2010 showed moderately dilated RV with hypokinesis, LV EF 47%. EF 50-55% 2010 but down to 45% 05/2011. Cath 01/2009 without significant CAD.  [name] Atrial fibrillation or flutter     Paroxysmal. Started on Pradaxa 05/2011.  . Cardiac defibrillator in place   . Anxiety    Past Surgical History  Procedure Date  . Appendectomy   . Cardiac  defibrillator placement 02/19/09     St,. Jude    History   Social History  . Marital Status: Divorced    Spouse Name: N/A    Number of Children: N/A  . Years of Education: N/A   Occupational History  . Not on file.   Social History Main Topics  . Smoking status: Never Smoker   . Smokeless tobacco: Not on file  . Alcohol Use: Yes     social  . Drug Use: No  . Sexually Active: Yes    Birth Control/ Protection: Coitus interruptus   Other Topics Concern  . Not on file   Social History Narrative   Divorced w 1 child. Self employed.     Family History  Problem Relation Age of Onset  . Heart attack Father      No Known Allergies   (Not in a hospital admission)  Current facility-administered medications:0.9 %  sodium chloride infusion, 250 mL, Intravenous, PRN, Shelva Majestic, MD;  0.9 %  sodium chloride infusion, , Intravenous, Continuous, Shelva Majestic, MD;  albuterol (PROVENTIL HFA;VENTOLIN HFA) 108 (90 BASE) MCG/ACT inhaler 4 puff, 4 puff, Inhalation, Q2H PRN, Shelva Majestic, MD;  atropine 0.1 MG/ML injection, , , ,  azithromycin (ZITHROMAX) 500 mg in dextrose 5 % 250 mL IVPB, 500 mg, Intravenous, Once, Sherryl Manges, MD, 500 mg at 10/27/11 1906;  cefTRIAXone (ROCEPHIN) injection 1 g, 1 g, Intramuscular, Once, Sherryl Manges, MD, 1 g at 10/27/11 1902;  dabigatran (PRADAXA) capsule 150 mg, 150 mg, Oral, Q12H, Shelva Majestic, MD;  DOPamine (INTROPIN) 800 mg in dextrose 5 % 250 mL infusion, 5 mcg/kg/min, Intravenous, Titrated, Shelva Majestic, MD escitalopram (LEXAPRO) tablet 20 mg, 20 mg, Oral, Daily, Shelva Majestic, MD;  fluticasone (FLONASE) 50 MCG/ACT nasal spray 2 spray, 2 spray, Each Nare, Daily PRN, Shelva Majestic, MD;  lidocaine (XYLOCAINE) 1 % injection, , , , , 2 mL at 10/27/11 1904;  ondansetron [facility]) 4 MG/2ML injection, , , , ;  sodium chloride 0.9 % bolus 1,000 mL, 1,000 mL, Intravenous, Once, Sherryl Manges, MD, 1,000 mL at 10/27/11 1845 sodium  chloride 0.9 % bolus 2,000 mL, 2,000 mL, Intravenous, Once, Sherryl Manges, MD, 2,000 mL at 10/27/11 1745 Current outpatient prescriptions:amiodarone (PACERONE) 200 MG tablet, Take 1 tablet (200 mg total) by mouth daily., Disp: 90 tablet, Rfl: 1;  Calcium-Magnesium-Zinc (CAL-MAG-ZINC PO), Take 1 tablet by mouth daily. , Disp: , Rfl: ;  dabigatran (PRADAXA) 150 MG CAPS, Take 1 capsule (150 mg total) by mouth every 12 (twelve) hours., Disp: 60 capsule, Rfl: 6;  escitalopram (LEXAPRO) 20 MG tablet, Take 20 mg by mouth daily., Disp: , Rfl:  fluticasone (FLONASE) 50 MCG/ACT nasal spray, Place 2 sprays into the nose daily as needed. For congestion, Disp: , Rfl: ;  metoprolol tartrate (LOPRESSOR) 25 MG tablet, Take 3 tablets (75 mg total) by mouth 2 (two) times daily., Disp: 180 tablet, Rfl: 1;  Omega-3 Fatty Acids (FISH OIL) 1000 MG CAPS, Take 3 capsules by mouth daily. , Disp: , Rfl: ;  vitamin C (ASCORBIC ACID) 500 MG tablet, Take 1,000 mg by mouth daily. , Disp: , Rfl:   Physical Exam: Blood pressure 89/59, pulse 111, temperature 97.8 F (36.6 C), temperature source Oral, resp. rate 28, height 6\' 2"  (1.88 m), weight 97.07 kg (214 lb), SpO2 90.00%.; Body mass index is 27.48 kg/(m^2). Temp:  [97.8 F (36.6 C)] 97.8 F (36.6 C) (07/30 1958) Pulse Rate:  [57-111] 111  (07/30 2115) Resp:  [17-28] 28  (07/30 2115) BP: (75-104)/(56-90) 89/59 mmHg (07/30 2115) SpO2:  [88 %-99 %] 90 % (07/30 2115) Weight:  [97.07 kg (214 lb)] 97.07 kg (214 lb) (07/30 1958)  No intake or output data in the 24 hours ending 10/27/11 2137 General: NAD Heent: dry MM Neck: No JVD  CV:RRR, no m   Lungs: Clear to auscultation bilaterally with normal respiratory effort Abdomen: Soft, nontender Extremities: No clubbing or cyanosis. No pedal edema Skin: Intact without lesions or rashes  Neurologic: Alert and oriented x 3, grossly nonfocal  Psych: Normal mood and affect    Labs: No results found for this basename:  CKTOTAL:4,CKMB:4,TROPONINI:4 in the last 72 hours Lab Results  Component Value Date   WBC 7.7 10/27/2011   HGB 13.6 10/27/2011   HCT 40.5 10/27/2011   MCV 87.5 10/27/2011   PLT 122* 10/27/2011    Lab 10/27/11 1733  NA 139  K 4.7  CL 107  CO2 19  BUN 26*  CREATININE 1.60*  CALCIUM 7.6*  PROT 5.3*  BILITOT 1.5*  ALKPHOS 34*  ALT 19  AST 29  GLUCOSE 112*   Lab Results  Component Value Date   CHOL  Value: 146  ATP III CLASSIFICATION:  <200     mg/dL   Desirable  161-096  mg/dL   Borderline High  >=045    mg/dL   High        40/98/1191   HDL 43 02/15/2009   LDLCALC  Value: 93        Total Cholesterol/HDL:CHD Risk Coronary Heart Disease Risk Table                     Men   Women  1/2 Average Risk   3.4   3.3  Average Risk       5.0   4.4  2 X Average Risk   9.6   7.1  3 X Average Risk  23.4   11.0        Use the calculated Patient Ratio above and the CHD Risk Table to determine the patient's CHD Risk.        ATP III CLASSIFICATION (LDL):  <100     mg/dL   Optimal  478-295  mg/dL   Near or Above                    Optimal  130-159  mg/dL   Borderline  621-308  mg/dL   High  >657     mg/dL   Very High 84/69/6295   TRIG 48 02/15/2009       EKG:  Sinus brady 63, 1 deg AV block   Radiology:  Dg Chest Port 1 View  10/27/2011  *RADIOLOGY REPORT*  Clinical Data: Weakness and dehydration.  PORTABLE CHEST - 1 VIEW  Comparison: 05/07/2011  Findings: There is a new opacity along the right cardiac border. The opacity is most likely within the right middle lobe.  Again noted is a left cardiac ICD.  Heart size is upper limits of normal but unchanged.  No evidence of pulmonary edema.  Left lung remains clear.  IMPRESSION: New opacity in the right middle lobe region.  Findings are concerning for pneumonia.  Recommend follow-up to ensure resolution.  Original Report Authenticated By: Richarda Overlie, M.D.    ASSESSMENT:  Pt is a 66 yo man with ARVD, VT, AF s/p dual chamber ICD.  He presents with prod  cough, fever, chills - found to have PNA and sepsis/septic shock.  While in the ED, pt was noted to have bradycardia into 30's per RN with reported sx, so cardiology consulted for possible device malfunction.   PLAN:  ?Bradycardia - there are no strips/events recorded of this on tele.  Could have been that pt had ectopy and monitor miscounted HR because of this.  It is unclear what happened during the event in question.  EKG is sinus rhythm, current tele shows sinus rhythm. CXR shows normal lead position.  Device interrogation at bedside with normal lead impedances, no VT/VF events.  V lead pulse amplitude is 5v.  Last device interrogation 09/26/11 with normal device function.    Recommendations:   - no events on device interrogation - formal device interrogation in am by St. Jude Rep - ok to hold BB given hypotension/possible bradycardia - please continue amiodarone - dopamine as first line for hypotension - there is no indication to force the patient to pace by increasing his baseline pacing rate as clinically questioned - would use medications as a first line at this time (if needed) - if any further events of bradycardia - please try to capture on tele for review and/or get EKG  if possible.  Ok to try atropine for any symptomatic bradycardia - continue tele monitoring   Thank you for this consultation.  Will continue to follow.  D/w Dr. Ladona Ridgel - on call EP  Signed: Hilary Hertz, MD Cardiology Fellow 10/27/2011, 9:37 PM

## 2011-10-27 NOTE — ED Notes (Signed)
Pt given 2 warm blankets

## 2011-10-27 NOTE — ED Notes (Signed)
Pt in via [facility], pt became hypotensive @ [facility] & EMS was notified, pt denies CP, SOB, pt c/o N/V onset today worse with standing, pt denies current CP, pt in 1st degree heart block in route, pt hx of ARVD with hx of v tach, pt hx of a fib, pt permanent defib, will continue to monitor, A&O x4, follows commands, speaks in complete sentences,NAD

## 2011-10-28 ENCOUNTER — Inpatient Hospital Stay (HOSPITAL_COMMUNITY): Payer: Medicare Other

## 2011-10-28 DIAGNOSIS — R6521 Severe sepsis with septic shock: Secondary | ICD-10-CM

## 2011-10-28 DIAGNOSIS — I369 Nonrheumatic tricuspid valve disorder, unspecified: Secondary | ICD-10-CM

## 2011-10-28 DIAGNOSIS — R6520 Severe sepsis without septic shock: Secondary | ICD-10-CM

## 2011-10-28 DIAGNOSIS — I4891 Unspecified atrial fibrillation: Secondary | ICD-10-CM

## 2011-10-28 DIAGNOSIS — Q249 Congenital malformation of heart, unspecified: Secondary | ICD-10-CM

## 2011-10-28 DIAGNOSIS — J9601 Acute respiratory failure with hypoxia: Secondary | ICD-10-CM

## 2011-10-28 DIAGNOSIS — J9600 Acute respiratory failure, unspecified whether with hypoxia or hypercapnia: Secondary | ICD-10-CM

## 2011-10-28 DIAGNOSIS — A419 Sepsis, unspecified organism: Principal | ICD-10-CM

## 2011-10-28 DIAGNOSIS — I498 Other specified cardiac arrhythmias: Secondary | ICD-10-CM

## 2011-10-28 DIAGNOSIS — J189 Pneumonia, unspecified organism: Secondary | ICD-10-CM

## 2011-10-28 LAB — COMPREHENSIVE METABOLIC PANEL
BUN: 27 mg/dL — ABNORMAL HIGH (ref 6–23)
CO2: 16 mEq/L — ABNORMAL LOW (ref 19–32)
Chloride: 110 mEq/L (ref 96–112)
Creatinine, Ser: 1.55 mg/dL — ABNORMAL HIGH (ref 0.50–1.35)
GFR calc non Af Amer: 45 mL/min — ABNORMAL LOW (ref >90)
Total Bilirubin: 1.4 mg/dL — ABNORMAL HIGH (ref 0.3–1.2)

## 2011-10-28 LAB — POCT I-STAT 3, ART BLOOD GAS (G3+)
Acid-base deficit: 10 mmol/L — ABNORMAL HIGH (ref 0.0–2.0)
Acid-base deficit: 11 mmol/L — ABNORMAL HIGH (ref 0.0–2.0)
Acid-base deficit: 3 mmol/L — ABNORMAL HIGH (ref 0.0–2.0)
Acid-base deficit: 9 mmol/L — ABNORMAL HIGH (ref 0.0–2.0)
Bicarbonate: 17.8 mEq/L — ABNORMAL LOW (ref 20.0–24.0)
Bicarbonate: 18.8 mEq/L — ABNORMAL LOW (ref 20.0–24.0)
Bicarbonate: 19.7 mEq/L — ABNORMAL LOW (ref 20.0–24.0)
O2 Saturation: 69 %
O2 Saturation: 84 %
O2 Saturation: 86 %
O2 Saturation: 87 %
Patient temperature: 101.1
Patient temperature: 101.1
Patient temperature: 98.1
Patient temperature: 98.6
Patient temperature: 98.7
TCO2: 15 mmol/L (ref 0–100)
TCO2: 17 mmol/L (ref 0–100)
TCO2: 17 mmol/L (ref 0–100)
TCO2: 18 mmol/L (ref 0–100)
TCO2: 23 mmol/L (ref 0–100)
pCO2 arterial: 46 mmHg — ABNORMAL HIGH (ref 35.0–45.0)
pCO2 arterial: 28.2 mmHg — ABNORMAL LOW (ref 35.0–45.0)
pCO2 arterial: 34.3 mmHg — ABNORMAL LOW (ref 35.0–45.0)
pCO2 arterial: 45.2 mmHg — ABNORMAL HIGH (ref 35.0–45.0)
pH, Arterial: 7.205 — ABNORMAL LOW (ref 7.350–7.450)
pH, Arterial: 7.254 — ABNORMAL LOW (ref 7.350–7.450)
pH, Arterial: 7.261 — ABNORMAL LOW (ref 7.350–7.450)
pH, Arterial: 7.278 — ABNORMAL LOW (ref 7.350–7.450)
pH, Arterial: 7.352 (ref 7.350–7.450)
pO2, Arterial: 63 mmHg — ABNORMAL LOW (ref 80.0–100.0)
pO2, Arterial: 56 mmHg — ABNORMAL LOW (ref 80.0–100.0)

## 2011-10-28 LAB — CARBOXYHEMOGLOBIN
Carboxyhemoglobin: 0.7 % (ref 0.5–1.5)
O2 Saturation: 74.3 %

## 2011-10-28 LAB — CBC
Hemoglobin: 14.4 g/dL (ref 13.0–17.0)
MCH: 29.5 pg (ref 26.0–34.0)
MCHC: 34 g/dL (ref 30.0–36.0)
MCV: 86.1 fL (ref 78.0–100.0)
Platelets: 120 10*3/uL — ABNORMAL LOW (ref 150–400)
Platelets: 131 10*3/uL — ABNORMAL LOW (ref 150–400)
RDW: 14.2 % (ref 11.5–15.5)
RDW: 14.1 % (ref 11.5–15.5)
WBC: 10.2 10*3/uL (ref 4.0–10.5)

## 2011-10-28 LAB — URINALYSIS, ROUTINE W REFLEX MICROSCOPIC
Glucose, UA: NEGATIVE mg/dL
Ketones, ur: 15 mg/dL — AB
Nitrite: NEGATIVE
Specific Gravity, Urine: 1.019 (ref 1.005–1.030)
pH: 6 (ref 5.0–8.0)

## 2011-10-28 LAB — URINE MICROSCOPIC-ADD ON

## 2011-10-28 LAB — CARDIAC PANEL(CRET KIN+CKTOT+MB+TROPI)
CK, MB: 7.9 ng/mL (ref 0.3–4.0)
CK, MB: 6.7 ng/mL (ref 0.3–4.0)
CK, MB: 5 ng/mL — ABNORMAL HIGH (ref 0.3–4.0)
Relative Index: 1.7 (ref 0.0–2.5)
Total CK: 258 U/L — ABNORMAL HIGH (ref 7–232)
Troponin I: <0.3 ng/mL (ref <0.30)

## 2011-10-28 LAB — LACTIC ACID, PLASMA
Lactic Acid, Venous: 4.9 mmol/L — ABNORMAL HIGH (ref 0.5–2.2)
Lactic Acid, Venous: 5.3 mmol/L — ABNORMAL HIGH (ref 0.5–2.2)

## 2011-10-28 LAB — BASIC METABOLIC PANEL
Calcium: 7.5 mg/dL — ABNORMAL LOW (ref 8.4–10.5)
Chloride: 110 mEq/L (ref 96–112)
Creatinine, Ser: 1.52 mg/dL — ABNORMAL HIGH (ref 0.50–1.35)
GFR calc Af Amer: 53 mL/min — ABNORMAL LOW (ref >90)
Sodium: 141 mEq/L (ref 135–145)

## 2011-10-28 LAB — STREP PNEUMONIAE URINARY ANTIGEN: Strep Pneumo Urinary Antigen: NEGATIVE

## 2011-10-28 LAB — APTT: aPTT: 55 seconds — ABNORMAL HIGH (ref 24–37)

## 2011-10-28 LAB — PROTIME-INR: Prothrombin Time: 21.6 seconds — ABNORMAL HIGH (ref 11.6–15.2)

## 2011-10-28 LAB — MAGNESIUM: Magnesium: 1.4 mg/dL — ABNORMAL LOW (ref 1.5–2.5)

## 2011-10-28 LAB — GLUCOSE, CAPILLARY
Glucose-Capillary: 93 mg/dL (ref 70–99)
Glucose-Capillary: 156 mg/dL — ABNORMAL HIGH (ref 70–99)

## 2011-10-28 LAB — CORTISOL: Cortisol, Plasma: 59.9 ug/dL

## 2011-10-28 LAB — LEGIONELLA ANTIGEN, URINE: Legionella Antigen, Urine: NEGATIVE

## 2011-10-28 MED ORDER — MIDAZOLAM HCL 2 MG/2ML IJ SOLN
8.0000 mg | Freq: Once | INTRAMUSCULAR | Status: AC
  Administered 2011-10-28: 8 mg via INTRAVENOUS

## 2011-10-28 MED ORDER — VANCOMYCIN HCL IN DEXTROSE 1-5 GM/200ML-% IV SOLN
1000.0000 mg | Freq: Two times a day (BID) | INTRAVENOUS | Status: DC
  Administered 2011-10-29 (×2): 1000 mg via INTRAVENOUS
  Filled 2011-10-28 (×4): qty 200

## 2011-10-28 MED ORDER — MAGNESIUM SULFATE 40 MG/ML IJ SOLN
4.0000 g | Freq: Once | INTRAMUSCULAR | Status: AC
  Administered 2011-10-28: 4 g via INTRAVENOUS
  Filled 2011-10-28: qty 100

## 2011-10-28 MED ORDER — SODIUM BICARBONATE 8.4 % IV SOLN
INTRAVENOUS | Status: AC
  Administered 2011-10-28: 50 meq via INTRAVENOUS
  Filled 2011-10-28: qty 50

## 2011-10-28 MED ORDER — MIDAZOLAM BOLUS VIA INFUSION
1.0000 mg | INTRAVENOUS | Status: DC | PRN
  Filled 2011-10-28: qty 2

## 2011-10-28 MED ORDER — DEXTROSE 5 % IV SOLN
INTRAVENOUS | Status: DC
  Administered 2011-10-28 – 2011-10-29 (×3): via INTRAVENOUS
  Filled 2011-10-28 (×6): qty 150

## 2011-10-28 MED ORDER — SODIUM CHLORIDE 0.9 % IV SOLN
50.0000 ug/h | INTRAVENOUS | Status: DC
  Administered 2011-10-29: 150 ug/h via INTRAVENOUS
  Filled 2011-10-28: qty 50

## 2011-10-28 MED ORDER — NOREPINEPHRINE BITARTRATE 1 MG/ML IJ SOLN: 2.0000 ug/min | INTRAVENOUS | Status: DC

## 2011-10-28 MED ORDER — FENTANYL CITRATE 0.05 MG/ML IJ SOLN
INTRAMUSCULAR | Status: AC
  Filled 2011-10-28: qty 4

## 2011-10-28 MED ORDER — DEXTROSE 5 % IV SOLN
1.0000 g | INTRAVENOUS | Status: DC
  Filled 2011-10-28: qty 10

## 2011-10-28 MED ORDER — SODIUM BICARBONATE 8.4 % IV SOLN
50.0000 meq | Freq: Once | INTRAVENOUS | Status: AC
  Administered 2011-10-28: 50 meq via INTRAVENOUS

## 2011-10-28 MED ORDER — FENTANYL BOLUS VIA INFUSION
50.0000 ug | Freq: Four times a day (QID) | INTRAVENOUS | Status: DC | PRN
  Filled 2011-10-28: qty 100

## 2011-10-28 MED ORDER — VECURONIUM BROMIDE 10 MG IV SOLR
INTRAVENOUS | Status: AC
  Filled 2011-10-28: qty 10

## 2011-10-28 MED ORDER — HALOPERIDOL LACTATE 5 MG/ML IJ SOLN
2.0000 mg | Freq: Four times a day (QID) | INTRAMUSCULAR | Status: DC | PRN
  Filled 2011-10-28: qty 1

## 2011-10-28 MED ORDER — MIDAZOLAM HCL 2 MG/2ML IJ SOLN
INTRAMUSCULAR | Status: AC
  Filled 2011-10-28: qty 4

## 2011-10-28 MED ORDER — HYDROCORTISONE SOD SUCCINATE 100 MG IJ SOLR
50.0000 mg | Freq: Three times a day (TID) | INTRAMUSCULAR | Status: DC
  Administered 2011-10-28 – 2011-10-29 (×3): 50 mg via INTRAVENOUS
  Filled 2011-10-28 (×6): qty 1

## 2011-10-28 MED ORDER — INSULIN ASPART 100 UNIT/ML ~~LOC~~ SOLN
0.0000 [IU] | SUBCUTANEOUS | Status: DC
  Administered 2011-10-28 (×2): 1 [IU] via SUBCUTANEOUS
  Administered 2011-10-29 (×2): 3 [IU] via SUBCUTANEOUS
  Administered 2011-10-29: 1 [IU] via SUBCUTANEOUS

## 2011-10-28 MED ORDER — SODIUM CHLORIDE 0.9 % IV SOLN
50.0000 ug/h | INTRAVENOUS | Status: DC
  Administered 2011-10-28: 150 ug/h via INTRAVENOUS
  Administered 2011-10-28: 100 ug/h via INTRAVENOUS
  Filled 2011-10-28 (×2): qty 50

## 2011-10-28 MED ORDER — FENTANYL CITRATE 0.05 MG/ML IJ SOLN
400.0000 ug | Freq: Once | INTRAMUSCULAR | Status: AC
  Administered 2011-10-28: 400 ug via INTRAVENOUS

## 2011-10-28 MED ORDER — SODIUM CHLORIDE 0.9 % IV SOLN
2.0000 mg/h | INTRAVENOUS | Status: DC
  Administered 2011-10-28 (×3): 5 mg/h via INTRAVENOUS
  Filled 2011-10-28 (×3): qty 10

## 2011-10-28 MED ORDER — HALOPERIDOL LACTATE 5 MG/ML IJ SOLN: 4.0000 mg | Freq: Four times a day (QID) | INTRAMUSCULAR | Status: DC | PRN

## 2011-10-28 MED ORDER — VASOPRESSIN 20 UNIT/ML IJ SOLN
0.0300 [IU]/min | INTRAMUSCULAR | Status: DC
  Administered 2011-10-28: 0.03 [IU]/min via INTRAVENOUS
  Filled 2011-10-28 (×2): qty 2.5

## 2011-10-28 MED ORDER — HALOPERIDOL LACTATE 5 MG/ML IJ SOLN
INTRAMUSCULAR | Status: AC
  Filled 2011-10-28: qty 1

## 2011-10-28 MED ORDER — SODIUM CHLORIDE 0.9 % IV SOLN
0.5000 ug/kg/min | INTRAVENOUS | Status: DC
  Administered 2011-10-28: 0.5 ug/kg/min via INTRAVENOUS
  Filled 2011-10-28: qty 20

## 2011-10-28 MED ORDER — SODIUM CHLORIDE 0.9 % IV SOLN
2.0000 mg/h | INTRAVENOUS | Status: DC
  Administered 2011-10-29: 2 mg/h via INTRAVENOUS
  Filled 2011-10-28: qty 10

## 2011-10-28 MED ORDER — MIDAZOLAM HCL 2 MG/2ML IJ SOLN
1.0000 mg | INTRAMUSCULAR | Status: DC | PRN
  Administered 2011-10-28: 2 mg via INTRAVENOUS
  Filled 2011-10-28: qty 2

## 2011-10-28 MED ORDER — ATROPINE SULFATE 1 MG/ML IJ SOLN
INTRAMUSCULAR | Status: AC
  Filled 2011-10-28: qty 1

## 2011-10-28 MED ORDER — ETOMIDATE 2 MG/ML IV SOLN
20.0000 mg | Freq: Once | INTRAVENOUS | Status: AC
  Administered 2011-10-28: 20 mg via INTRAVENOUS

## 2011-10-28 MED ORDER — LEVOFLOXACIN IN D5W 750 MG/150ML IV SOLN
750.0000 mg | INTRAVENOUS | Status: DC
  Administered 2011-10-28: 750 mg via INTRAVENOUS
  Filled 2011-10-28: qty 150

## 2011-10-28 MED ORDER — ROCURONIUM BROMIDE 50 MG/5ML IV SOLN: 0.6000 mg/kg | Freq: Once | INTRAVENOUS | Status: DC

## 2011-10-28 MED ORDER — LEVOFLOXACIN IN D5W 750 MG/150ML IV SOLN
750.0000 mg | INTRAVENOUS | Status: DC
  Administered 2011-10-29 – 2011-11-01 (×4): 750 mg via INTRAVENOUS
  Filled 2011-10-28 (×6): qty 150

## 2011-10-28 MED ORDER — SODIUM BICARBONATE 8.4 % IV SOLN
50.0000 meq | Freq: Once | INTRAVENOUS | Status: AC
  Administered 2011-10-28: 50 meq via INTRAVENOUS
  Filled 2011-10-28: qty 50

## 2011-10-28 MED ORDER — BIOTENE DRY MOUTH MT LIQD
15.0000 mL | Freq: Four times a day (QID) | OROMUCOSAL | Status: DC
  Administered 2011-10-28 – 2011-10-31 (×12): 15 mL via OROMUCOSAL

## 2011-10-28 MED ORDER — CHLORHEXIDINE GLUCONATE 0.12 % MT SOLN
15.0000 mL | Freq: Two times a day (BID) | OROMUCOSAL | Status: DC
  Administered 2011-10-28 – 2011-10-29 (×4): 15 mL via OROMUCOSAL
  Filled 2011-10-28 (×4): qty 15

## 2011-10-28 MED ORDER — VANCOMYCIN HCL 1000 MG IV SOLR
1750.0000 mg | Freq: Once | INTRAVENOUS | Status: AC
  Administered 2011-10-28: 1750 mg via INTRAVENOUS
  Filled 2011-10-28: qty 1750

## 2011-10-28 MED ORDER — DEXTROSE 10 % IV SOLN: INTRAVENOUS | Status: DC | PRN

## 2011-10-28 MED ORDER — ARTIFICIAL TEARS OP OINT: TOPICAL_OINTMENT | Freq: Three times a day (TID) | OPHTHALMIC | Status: DC

## 2011-10-28 MED ORDER — NOREPINEPHRINE BITARTRATE 1 MG/ML IJ SOLN
2.0000 ug/min | INTRAVENOUS | Status: DC
  Administered 2011-10-28: 10 ug/min via INTRAVENOUS
  Administered 2011-10-28: 38 ug/min via INTRAVENOUS
  Administered 2011-10-29: 10 ug/min via INTRAVENOUS
  Filled 2011-10-28 (×3): qty 16

## 2011-10-28 MED ORDER — DOBUTAMINE IN D5W 4-5 MG/ML-% IV SOLN
2.5000 ug/kg/min | INTRAVENOUS | Status: DC
  Administered 2011-10-28: 2.5 ug/kg/min via INTRAVENOUS
  Filled 2011-10-28: qty 250

## 2011-10-28 NOTE — Progress Notes (Signed)
Patient began shaking after boosting him in bed. He would follow commands and times but would not maintain concentration.  MD Molli Knock notified.  Patient temp was 97.7 oral and Order given to start warming blanket. Family at bedside and updated, Versed 2mg  IVP given for patient comfort.

## 2011-10-28 NOTE — Progress Notes (Signed)
Name: Robert Ochoa MRN: [id number] DOB: 25-Sep-1945    LOS: 1  Referring Provider:  EDP Reason for Referral:  Suspected sepsis Cards - [facility]  PULMONARY / CRITICAL CARE MEDICINE  Brief patient description:  66 year old male with a history of ARVD (Arrhythmogenic right ventricular dysplasia)  s/p dual-chamber St. Jude ICD presenting with sepsis likely due to RML pneumonia. Patient also with bradycardia in setting of sepsis.   Events Since Admission: 7/30 Admitted to ICU 7/31 Intubated  Current Status: Intubated and sedated/paralyzed.  Severe hypotension requiring 2 pressors  Vital Signs: Temp:  [97.8 F (36.6 C)-101.1 F (38.4 C)] 101.1 F (38.4 C) (07/31 0800) Pulse Rate:  [45-111] 67  (07/31 0800) Resp:  [0-30] 25  (07/31 0800) BP: (45-143)/(35-94) 88/55 mmHg (07/31 0800) SpO2:  [81 %-100 %] 93 % (07/31 0800) Arterial Line BP: (38-133)/(27-78) 83/53 mmHg (07/31 0800) FiO2 (%):  [99.9 %-100 %] 99.9 % (07/31 0800) Weight:  [214 lb (97.07 kg)-237 lb 7 oz (107.7 kg)] 237 lb 7 oz (107.7 kg) (07/31 0500)  Physical Examination: General: intubated, sedated Neuro:  Paralyzed, sedated HEENT:  mmm Neck:  Supple, no JVD Cardiovascular: Paced at 80bpm, distant heart sounds Lungs:  On ventilator, Coarse breath sounds bilaterally R >L with some crackles noted in right base,  Abdomen:  Soft, nontender, nondistended Musculoskeletal:  No edema, moves all extremities Skin:  Extremities slightly cool  Active Problems:  Atrial fibrillation  Right ventricular dysplasia  Sepsis  Community acquired pneumonia  Symptomatic bradycardia  Acute renal failure  Acute respiratory failure with hypoxia  Septic shock  ASSESSMENT AND PLAN  PULMONARY  Lab 10/28/11 0924 10/28/11 0626 10/28/11 0346 10/28/11 0149 10/28/11 0052  PHART 7.205* 7.278* 7.352 7.194* 7.335*  PCO2ART 48.4* 34.3* 28.2* 46.0* 31.0*  PO2ART 101.0* 62.0* 56.0* 63.0* 50.0*  HCO3 18.8* 16.0* 15.7* 17.8* 16.6*  O2SAT 95.0  89.0 88.0 86.0 84.0   Ventilator Settings: N/A Vent Mode:  [-] PRVC FiO2 (%):  [99.9 %-100 %] 99.9 % Set Rate:  [18 bmp-30 bmp] 30 bmp Vt Set:  [490 mL-600 mL] 490 mL PEEP:  [5 cmH20-12 cmH20] 12 cmH20 Plateau Pressure:  [22 cmH20-25 cmH20] 22 cmH20 CXR:  7/31. Worsening Pulm Edema, w/ R middle loab opacity consistent w/ Pneumonia. Right IJ catheter tip projects over the proximal SVC. Right subclavian catheter is unchanged, projecting over midline, indeterminate location.  ETT:  7/31  A:  Community acquired pneumonia. Worsening pulm edema. And Resp compensation for met acidosis. Hypoxia out of proportion to infiltrates ARDS  P:   Decrease rate on vent to improve autoPEEP, and decrease PEEP to 10 Rpt CXR does not suggest hemothroax Rpt ABG severe metab acidosis - add bicarb drip , increased rate back up to 26 Dc nimbex   CARDIOVASCULAR  Lab 10/28/11 0325 10/28/11 0241 10/27/11 2354 10/27/11 2340 10/27/11 1742  TROPONINI -- <0.30 -- -- --  LATICACIDVEN 5.3* -- 4.9* -- 5.0*  PROBNP -- -- -- 8743.0* --   ECG:  Paced rhythm. No sign of ischemia Lines: NA  A: SIRS. Symptomatic bradycardia, now paced at 80BPM.  MAP's are consistently > 65.  AICD.   PAF. On Dopamine and Levo. Lactate normal so will DC further studies. CE elevation likely from demand ischemia. Pro BNP elevated.  P:  Cardiology consult -d/w dr Johney Frame Hold home metoprolol and amiodarone Continue Levo  Titrate down on Dopamine as able to maintain HR > 70 Goal MAP >60 Vascular consult for R. Subclavian arterial line in  pt on pradaxa Trend Cardiac enzymes If coags normal on 8/1 then may pull R Subclavian -per dr Hiram Gash co-ox, start low dose dobutamine (no titration - d/w dr Johney Frame )-high risk for arrythmias  RENAL  Lab 10/28/11 0325 10/27/11 1733  NA 139 139  K 4.8 4.7  CL 110 107  CO2 16* 19  BUN 27* 26*  CREATININE 1.55* 1.60*  CALCIUM 7.9* 7.6*  MG 1.4* --  PHOS -- --   Intake/Output      07/30 0701  - 07/31 0700 07/31 0701 - 08/01 0700   I.V. (mL/kg) 825.8 (7.7) 251.3 (2.3)   IV Piggyback 100    Total Intake(mL/kg) 925.8 (8.6) 251.3 (2.3)   Urine (mL/kg/hr) 1060 (0.4) 300   Total Output 1060 300   Net -134.2 -48.8         Foley:  NA  A:  AKI / Acute renal failure.  Hypovolemia. Hypomagnesemia P:   Trend BMET IVF NS 100 mL/h Replete Mg  GASTROINTESTINAL  Lab 10/28/11 0325 10/27/11 1733  AST 74* 29  ALT 57* 19  ALKPHOS 33* 34*  BILITOT 1.4* 1.5*  PROT 5.7* 5.3*  ALBUMIN 2.7* 2.6*    A:  Mildly elevated bilirubin, etiology is not clear. P:   Trend LFT  HEMATOLOGIC  Lab 10/28/11 0325 10/27/11 2354 10/27/11 1733  HGB 14.4 -- 13.6  HCT 42.4 -- 40.5  PLT 120* -- 122*  INR -- 1.84* --  APTT -- 55* --   A:  Thrombocytopenia. P:  Trend CBC Dc Pradaxa  INFECTIOUS  Lab 10/28/11 0325 10/27/11 1736 10/27/11 1733  WBC 12.4* -- 7.7  PROCALCITON -- 17.08 --   Cultures: 7/30 Blood Cx >>>  Antibiotics: Azithromycin 7/30 x 1 Ceftriaxone 7/30 >>>7/31 Levaquin 7/30>>>  A:  Community acquired pneumonia with sepsis.  P:   Antibiotics and cultures as above Urine strep and legionella Ag  ENDOCRINE  Lab 10/28/11 0815  GLUCAP 93   A:  Concern adrenal insufficiency.    P:   Obtain cortisol level -60 Stress dose steroids  NEUROLOGIC  A:  No acute issues P:  Continuous sedation   BEST PRACTICE / DISPOSITION Level of Care:  ICU Primary Service:  PCCM Consultants:  Cardiology Code Status:  Full Diet:  Clear liquids DVT Px:  Not indicated (Pradaxa) GI Px:  Protonix Skin Integrity:  Intact Social / Family:  Updated daughter  Shelly Flatten, MD Family Medicine PGY-2 10/28/2011, 9:33 AM  Care during the described time interval was provided by me and/or other providers on the critical care team.  I have reviewed this patient's available data, including medical history, events of note, physical examination and test results as part of my evaluation  CC  time x  60 mins  Cyril Mourning MD. FCCP. [facility] Pager [phone] If no response call [phone]

## 2011-10-28 NOTE — Significant Event (Signed)
F/u ABG shows persistent hypoxia (PaO2/FiO2 = 56).  Will start ARDS protocol.  Coralyn Helling, MD 10/28/2011, 4:02 AM

## 2011-10-28 NOTE — Procedures (Signed)
Name: [name] MRN: [id number] DOB: 12-16-1945  DOS:   PROCEDURE NOTE  Procedure:  Endotracheal intubation.  Indication:  Acute respiratory failure  Consent:  Consent was implied due to the emergency nature of the procedure.  Anesthesia:  A total of 10 mg of Etomidate was given intravenously.  Procedure summary:  Appropriate equipment was assembled. The patient was identified as Robert Ochoa and safety timeout was performed. The patient was placed supine, with head in sniffing position. After adequate level of anesthesia was achieved, a GS#4 blade was inserted into the oropharynx and the vocal cords were visualized. A 8.0 endotracheal tube was inserted without difficulty and visualized going through the vocal cords. The stylette was removed and cuff inflated. Colorimetric change was noted on the CO2 meter. Breath sounds were heard over both lung fields equally. Post procedure chest xray was ordered.  Complications:  No immediate complications were noted.  Hemodynamic parameters and oxygenation remained stable throughout the procedure.    Orlean Bradford, M.D. Pulmonary and Critical Care Medicine [facility] Cell: [phone] Pager: [phone]  10/28/2011, 2:26 AM

## 2011-10-28 NOTE — Progress Notes (Signed)
ANTIBIOTIC CONSULT NOTE - INITIAL  Pharmacy Consult for vancomycin Indication: rule out pneumonia  No Known Allergies  Patient Measurements: Height: 6\' 2"  (188 cm) Weight: 237 lb Robert Robert Ochoa oz (107.Robert Robert Ochoa kg) IBW/kg (Calculated) : 82.2  Adjusted Body Wt: 92 kg   Vital Signs: Temp: 97.9 F (36.6 C) (07/31 1826) Temp src: Oral (07/31 1826) BP: 93/66 mmHg (07/31 1845) Pulse Rate: 88  (07/31 1845) Intake/Output from previous day: 07/30 0701 - 07/31 0700 In: 925.8 [I.V.:825.8; IV Piggyback:100] Out: 1060 [Urine:1060] Intake/Output from this shift:    Labs:  Basename 10/28/11 1132 10/28/11 0325 10/27/11 1733  WBC 10.2 12.4* Robert Robert Ochoa.Robert Robert Ochoa  HGB 13.9 14.4 13.6  PLT 131* 120* 122*  LABCREA -- -- --  CREATININE 1.52* 1.55* 1.60*   Estimated Creatinine Clearance: 62.5 ml/min (by C-G formula based on Cr of 1.52). No results found for this basename: VANCOTROUGH:2,VANCOPEAK:2,VANCORANDOM:2,GENTTROUGH:2,GENTPEAK:2,GENTRANDOM:2,TOBRATROUGH:2,TOBRAPEAK:2,TOBRARND:2,AMIKACINPEAK:2,AMIKACINTROU:2,AMIKACIN:2, in the last 72 hours   Microbiology: Recent Results (from the past 720 hour(s))  CULTURE, BLOOD (ROUTINE X 2)     Status: Normal (Preliminary result)   Collection Time   Robert Robert Ochoa Robert Ochoa  5:56 PM      Component Value Range Status Comment   Specimen Description BLOOD FEMORAL ARTERY RIGHT   Final    Special Requests BOTTLES DRAWN AEROBIC ONLY 10CC   Final    Culture  Setup Time 10/28/2011 00:49   Final    Culture     Final    Value: GRAM POSITIVE COCCI IN CLUSTERS     Note: Gram Stain Report Called to,Read Back By and Verified With: CHRIS @ 1803 ON Robert Robert Ochoa Robert Ochoa BY GOLLD   Report Status PENDING   Incomplete   MRSA PCR SCREENING     Status: Normal   Collection Time   Robert Robert Ochoa Robert Ochoa 10:25 PM      Component Value Range Status Comment   MRSA by PCR NEGATIVE  NEGATIVE Final     Medical History: Past Medical History  Diagnosis Date  . Ventricular tachycardia     ICD discharges 05/2011 for this & AF-RVR - started on  amiodarone then.  . Arrhythmogenic right ventricular dysplasia     Hx of VT s/p dual-chamber St. Jude ICD. Cardiac MRI 2010 showed moderately dilated RV with hypokinesis, LV EF 47%. EF 50-55% 2010 but down to 45% 05/2011. Cath 01/2009 without significant CAD.  [name] Atrial fibrillation or flutter     Paroxysmal. Started on Pradaxa 05/2011.  . Cardiac defibrillator in place   . Anxiety     Medications:  Scheduled:    . antiseptic oral rinse  15 mL Mouth Rinse QID  . atropine      . atropine      . azithromycin  500 mg Intravenous Once  . chlorhexidine  15 mL Mouth Rinse BID  . escitalopram  20 mg Oral Daily  . etomidate  20 mg Intravenous Once  . fentaNYL      . fentaNYL      . fentaNYL  400 mcg Intravenous Once  . haloperidol lactate      . hydrocortisone sod succinate (SOLU-CORTEF) injection  50 mg Intravenous Q8H  . insulin aspart  0-4 Units Subcutaneous Q4H  . levofloxacin (LEVAQUIN) IV  750 mg Intravenous Q24H  . lidocaine      . magnesium sulfate 1 - 4 g bolus IVPB  4 g Intravenous Once  . midazolam      . midazolam      . midazolam  8 mg Intravenous Once  . ondansetron      .  ondansetron      . pantoprazole (PROTONIX) IV  40 mg Intravenous Q24H  . sodium bicarbonate  50 mEq Intravenous Once  . sodium bicarbonate  50 mEq Intravenous Once  . vecuronium      . DISCONTD: amiodarone  200 mg Oral Daily  . DISCONTD: artificial tears   Both Eyes Q8H  . DISCONTD: cefTRIAXone (ROCEPHIN)  IV  1 g Intravenous Q24H  . DISCONTD: cefTRIAXone (ROCEPHIN)  IV  2 g Intravenous Q24H  . DISCONTD: dabigatran  150 mg Oral Q12H  . DISCONTD: levofloxacin (LEVAQUIN) IV  750 mg Intravenous Q24H  . DISCONTD: levofloxacin (LEVAQUIN) IV  750 mg Intravenous Q24H  . DISCONTD: rocuronium  0.6 mg/kg Intravenous Once   Infusions:    . dextrose    . DOBUTamine 2.5 mcg/kg/min (10/28/11 1542)  . fentaNYL infusion INTRAVENOUS    . midazolam (VERSED) infusion    . norepinephrine (LEVOPHED) Adult infusion  25 mcg/min (10/28/11 1842)  .  sodium bicarbonate infusion 1000 mL 100 mL/hr at 10/28/11 0945  . vasopressin (PITRESSIN) infusion - *FOR SHOCK* 0.03 Units/min (10/28/11 1130)  . DISCONTD: sodium chloride Stopped (10/28/11 0400)  . DISCONTD: cisatracurium (NIMBEX) infusion Stopped (10/28/11 1119)  . DISCONTD: DOPamine Stopped (10/28/11 1119)  . DISCONTD: fentaNYL infusion INTRAVENOUS 150 mcg/hr (10/28/11 1306)  . DISCONTD: midazolam (VERSED) infusion Stopped (10/28/11 1542)  . DISCONTD: norepinephrine (LEVOPHED) Adult infusion     Assessment: 66 yo Robert Ochoa to start vancomycin for r/o PNA.  Cultures today revealed GPC in clusters from Robert Robert Ochoa/30 Blood.    Goal of Therapy:  Vancomycin trough level 15-20 mcg/ml  Plan:  -Vancomycin 1750 mg IV x1 then 1000 mg IV q12h -Follow up SCr, UOP, cultures, clinical course and adjust as clinically indicated.  Jariel Drost L. Illene Bolus, PharmD, BCPS Clinical Pharmacist Pager: [phone] Pharmacy: [phone] Robert Robert Ochoa/31/2013 Robert Robert Ochoa:04 PM

## 2011-10-28 NOTE — Progress Notes (Signed)
CRITICAL VALUE ALERT  Critical value received: Gram positive Cocci in clusters from Blood culture on 10/27/11   Date of notification:  10/28/11  Time of notification:  18:30   Critical value read back:yes  Nurse who received alert:  Diona Fanti RN   MD notified (1st page):  Dr. Molli Knock  Time of first page:  18:35  MD notified (2nd page):  Time of second page:  Responding MD:  Dr. Molli Knock  Time MD responded:  18:35

## 2011-10-28 NOTE — H&P (Signed)
Name: Robert Ochoa MRN: [id number] DOB: October 04, 1945    LOS: 1  Referring Provider:  EDP Reason for Referral:  Suspected sepsis  PULMONARY / CRITICAL CARE MEDICINE  HPI:  66 year old male with a history of ARVD s/p dual-chamber St. Jude ICD presenting with shortness of breath and cough.   Patient states over last month he has had a "different cough". At baseline he has a cough related to his allergic rhinitis. Last night, patient got very feverish and chilled during his sleep associated with fatigue. Fever in AM to 101.4 after most severe portion of overnight illness had resolved.  He also has been more short of breath over the last day. He also felt faint frequently earlier today and patient reports fainting once before going to PCP. Patient presented to PCP today and was noted to have a BP of 76/46 so was sent to ED.   In ED, patient initially with blood pressure of 104/74 which subsequently dropped with MAPs <60 requiring fluid boluses to maintain MAP >60. CXR showed RML pneumonia so patient started on ceftriaxone and azithromycin for CAP. Patient also had a symptomatic bradycardic event into 30s-40s with associated hypotension which responded to 1 mg atropine. PCCM consulted for admission and cardiology called for consult.   Past Medical History  Diagnosis Date  . Ventricular tachycardia     ICD discharges 05/2011 for this & AF-RVR - started on amiodarone then.  . Arrhythmogenic right ventricular dysplasia     Hx of VT s/p dual-chamber St. Jude ICD. Cardiac MRI 2010 showed moderately dilated RV with hypokinesis, LV EF 47%. EF 50-55% 2010 but down to 45% 05/2011. Cath 01/2009 without significant CAD.  [name] Atrial fibrillation or flutter     Paroxysmal. Started on Pradaxa 05/2011.  . Cardiac defibrillator in place   . Anxiety    Past Surgical History  Procedure Date  . Appendectomy   . Cardiac defibrillator placement 02/19/09     St,. Jude    Prior to Admission medications   Medication  Sig Start Date End Date Taking? Authorizing Provider  amiodarone (PACERONE) 200 MG tablet Take 1 tablet (200 mg total) by mouth daily. 08/07/11  Yes Hillis Range, MD  Calcium-Magnesium-Zinc (CAL-MAG-ZINC PO) Take 1 tablet by mouth daily.    Yes Historical Provider, MD  dabigatran (PRADAXA) 150 MG CAPS Take 1 capsule (150 mg total) by mouth every 12 (twelve) hours. 05/11/11  Yes Dayna N Dunn, PA  escitalopram (LEXAPRO) 20 MG tablet Take 20 mg by mouth daily.   Yes Historical Provider, MD  fluticasone (FLONASE) 50 MCG/ACT nasal spray Place 2 sprays into the nose daily as needed. For congestion   Yes Historical Provider, MD  metoprolol tartrate (LOPRESSOR) 25 MG tablet Take 3 tablets (75 mg total) by mouth 2 (two) times daily. 10/07/11 10/06/12 Yes Hillis Range, MD  Omega-3 Fatty Acids (FISH OIL) 1000 MG CAPS Take 3 capsules by mouth daily.    Yes Historical Provider, MD  vitamin C (ASCORBIC ACID) 500 MG tablet Take 1,000 mg by mouth daily.    Yes Historical Provider, MD   Allergies No Known Allergies  Family History Family History  Problem Relation Age of Onset  . Heart attack Father    Social History  reports that he has never smoked. He does not have any smokeless tobacco history on file. He reports that he drinks alcohol. He reports that he does not use illicit drugs.  Review Of Systems:  Positive for rhinorrhea   Brief  patient description:  66 year old male with a history of ARVD s/p dual-chamber St. Jude ICD presenting with sepsis likely due to RML pneumonia. Patient also with bradycardia in setting of sepsis.   Events Since Admission: 7/30 Admitted to ICU  Current Status: mentating well, maintaining oxygenation on nasal cannula   Vital Signs: Temp:  [97.8 F (36.6 C)] 97.8 F (36.6 C) (07/30 1958) Pulse Rate:  [45-111] 86  (07/31 0545) Resp:  [0-30] 26  (07/31 0545) BP: (45-143)/(35-94) 124/66 mmHg (07/31 0530) SpO2:  [81 %-100 %] 100 % (07/31 0545) Arterial Line BP:  (38-133)/(27-78) 127/78 mmHg (07/31 0545) FiO2 (%):  [99.9 %-100 %] 100 % (07/31 0545) Weight:  [97.07 kg (214 lb)-107.7 kg (237 lb 7 oz)] 107.7 kg (237 lb 7 oz) (07/31 0500)  Physical Examination: General:  NAD, resting in bed, cooperative Neuro:  Grossly intact, AxO x3 HEENT:  Very dry mucus membranes, slight rhinorrhea Neck:  Supple, no JVD Cardiovascular:  Irregularly irregular, distant heart sounds Lungs:  Coarse breath sounds bilaterally R >L with some crackles noted in right base, tachypnea, minimal use of accessory muscles and no retractions Abdomen:  Soft, nontender, nondistended Musculoskeletal:  No edema, moves all extremities Skin:  Extremities slightly cool  Active Problems:  Atrial fibrillation  Right ventricular dysplasia  Sepsis  Community acquired pneumonia  Symptomatic bradycardia  Acute renal failure  Acute respiratory failure with hypoxia  Septic shock  ASSESSMENT AND PLAN  PULMONARY  Lab 10/28/11 0346 10/28/11 0149 10/28/11 0052 10/27/11 2336  PHART 7.352 7.194* 7.335* 7.261*  PCO2ART 28.2* 46.0* 31.0* 32.2*  PO2ART 56.0* 63.0* 50.0* 41.0*  HCO3 15.7* 17.8* 16.6* 14.5*  O2SAT 88.0 86.0 84.0 69.0   Ventilator Settings: N/A Vent Mode:  [-] PRVC FiO2 (%):  [99.9 %-100 %] 100 % Set Rate:  [18 bmp-30 bmp] 30 bmp Vt Set:  [570 mL-600 mL] 570 mL PEEP:  [5 cmH20-12 cmH20] 12 cmH20 Plateau Pressure:  [25 cmH20] 25 cmH20 CXR:  7/30 >>> RML pneumonia ETT:  N/A  A:  Community acquired pneumonia. P:   Supplemental oxygen for SpO2 >92% CXR in AM  CARDIOVASCULAR  Lab 10/28/11 0325 10/28/11 0241 10/27/11 2354 10/27/11 2340 10/27/11 1742  TROPONINI -- <0.30 -- -- --  LATICACIDVEN 5.3* -- 4.9* -- 5.0*  PROBNP -- -- -- 8743.0* --   ECG:  Sinus rhythm with possible first degree block (poor tracing).  Lines: NA  A: SIRS. Symptomatic bradycardia.  MAP's are consistently > 65.  AICD.  PAF. P:  Cardiology consult Repeat EKG  Hold home metoprolol and  amiodarone Start Dopamine gtt to maintain HR > 70 Goal MAP >60 No indication for CVL at this time A-line as noninvasive BP measurements do not appear reliable Trend Lactate Trend Cardiac enzymes  RENAL  Lab 10/28/11 0325 10/27/11 1733  NA 139 139  K 4.8 4.7  CL 110 107  CO2 16* 19  BUN 27* 26*  CREATININE 1.55* 1.60*  CALCIUM 7.9* 7.6*  MG 1.4* --  PHOS -- --   Intake/Output      07/30 0701 - 07/31 0700   I.V. (mL/kg) 825.8 (7.7)   IV Piggyback 100   Total Intake(mL/kg) 925.8 (8.6)   Urine (mL/kg/hr) 1060 (0.4)   Total Output 1060   Net -134.2        Foley:  NA  A:  AKI / Acute renal failure.  Hypovolemia. P:   Trend BMET IVF NS 100 mL/h  GASTROINTESTINAL  Lab 10/28/11 0325  10/27/11 1733  AST 74* 29  ALT 57* 19  ALKPHOS 33* 34*  BILITOT 1.4* 1.5*  PROT 5.7* 5.3*  ALBUMIN 2.7* 2.6*    A:  Mildly elevated bilirubin, etiology is not clear. P:   Trend LFT  HEMATOLOGIC  Lab 10/28/11 0325 10/27/11 2354 10/27/11 1733  HGB 14.4 -- 13.6  HCT 42.4 -- 40.5  PLT 120* -- 122*  INR -- 1.84* --  APTT -- 55* --   A:  Thrombocytopenia. P:  Trend CBC Pradaxa  INFECTIOUS  Lab 10/28/11 0325 10/27/11 1736 10/27/11 1733  WBC 12.4* -- 7.7  PROCALCITON -- 17.08 --   Cultures: 7/30 Blood Cx >>>  Antibiotics: Azithromycin 7/30 x 1 Ceftriaxone 7/30 >>> Levaquin 7/30>>>  A:  Community acquired pneumonia with sepsis.  P:   Antibiotics and cultures as above Urine strep and legionella Ag  ENDOCRINE No results found for this basename: GLUCAP:5 in the last 168 hours A:  Concern adrenal insufficiency.    P:   Obtain cortisol level  NEUROLOGIC  A:  No acute issues P:   Monitor  BEST PRACTICE / DISPOSITION Level of Care:  ICU Primary Service:  PCCM Consultants:  Cardiology Code Status:  Full Diet:  Clear liquids DVT Px:  Not indicated (Pradaxa) GI Px:  Protonix Skin Integrity:  Intact Social / Family:  Not available  Aldine Contes. Marti Sleigh, MD,  PGY2 10/28/2011 6:00 AM  Patient examined.  Records reviewed.  Assessment and plan above edited and discussed with ICU team. The patient is critically ill with multiple organ systems failure and requires high complexity decision making for assessment and support, frequent evaluation and titration of therapies, application of advanced monitoring technologies and extensive interpretation of multiple databases. Critical Care Time devoted to patient care services described in this note is 35 minutes.  Orlean Bradford, M.D., F.C.C.P. Pulmonary and Critical Care Medicine [facility] Cell: [phone] Pager: [phone]

## 2011-10-28 NOTE — ED Provider Notes (Signed)
CRITICAL CARE Performed by: Dione Booze   Total critical care time: 120 minutes  Critical care time was exclusive of separately billable procedures and treating other patients.  Critical care was necessary to treat or prevent imminent or life-threatening deterioration.  Critical care was time spent personally by me on the following activities: development of treatment plan with patient and/or surrogate as well as nursing, discussions with consultants, evaluation of patient's response to treatment, examination of patient, obtaining history from patient or surrogate, ordering and performing treatments and interventions, ordering and review of laboratory studies, ordering and review of radiographic studies, pulse oximetry and re-evaluation of patient's condition.   I saw and evaluated the patient, reviewed the resident's note and I agree with the findings and plan.   Dione Booze, MD 10/28/11 [phone]

## 2011-10-28 NOTE — Progress Notes (Signed)
PHARMACY - CRITICAL CARE PROGRESS NOTE  Pharmacy Consult for Levaquin Indication: Empiric PNA coverage   No Known Allergies  Patient Measurements: Height: 6\' 2"  (188 cm) Weight: 237 lb 7 oz (107.7 kg) IBW/kg (Calculated) : 82.2   Vital Signs: Temp: 101.1 F (38.4 C) (07/31 0800) Temp src: Oral (07/31 0800) BP: 88/65 mmHg (07/31 1230) Pulse Rate: 80  (07/31 1230) Intake/Output from previous day: 07/30 0701 - 07/31 0700 In: 925.8 [I.V.:825.8; IV Piggyback:100] Out: 1060 [Urine:1060] Intake/Output from this shift: Total I/O In: 760.6 [I.V.:760.6] Out: 575 [Urine:575] Vent settings for last 24 hours: Vent Mode:  [-] PRVC FiO2 (%):  [60 %-100 %] 60.1 % Set Rate:  [18 bmp-30 bmp] 26 bmp Vt Set:  [490 mL-600 mL] 490 mL PEEP:  [5 cmH20-12 cmH20] 10 cmH20 Plateau Pressure:  [17 cmH20-25 cmH20] 17 cmH20  Labs:  Basename 10/28/11 1132 10/28/11 0325 10/27/11 2354 10/27/11 1733  WBC 10.2 12.4* -- 7.7  HGB 13.9 14.4 -- 13.6  HCT 40.9 42.4 -- 40.5  PLT 131* 120* -- 122*  APTT -- -- 55* --  INR -- -- 1.84* --  CREATININE 1.52* 1.55* -- 1.60*  LABCREA -- -- -- --  CREATININE 1.52* 1.55* -- 1.60*  LABCREA -- -- -- --  CREAT24HRUR -- -- -- --  MG -- 1.4* -- --  PHOS -- -- -- --  ALBUMIN -- 2.7* -- 2.6*  PROT -- 5.7* -- 5.3*  AST -- 74* -- 29  ALT -- 57* -- 19  ALKPHOS -- 33* -- 34*  BILITOT -- 1.4* -- 1.5*  BILIDIR -- -- -- --  IBILI -- -- -- --   Estimated Creatinine Clearance: 62.5 ml/min (by C-G formula based on Cr of 1.52).   Basename 10/28/11 0815  GLUCAP 93    Microbiology: Recent Results (from the past 720 hour(s))  MRSA PCR SCREENING     Status: Normal   Collection Time   10/27/11 10:25 PM      Component Value Range Status Comment   MRSA by PCR NEGATIVE  NEGATIVE Final     Medications:  Anti-infectives     Start     Dose/Rate Route Frequency Ordered Stop   10/29/11 0800   levofloxacin (LEVAQUIN) IVPB 750 mg        750 mg 100 mL/hr over 90 Minutes  Intravenous Every 24 hours 10/28/11 0751     10/28/11 1900   cefTRIAXone (ROCEPHIN) 1 g in dextrose 5 % 50 mL IVPB  Status:  Discontinued        1 g 100 mL/hr over 30 Minutes Intravenous Every 24 hours 10/28/11 0552 10/28/11 1202   10/28/11 1900   cefTRIAXone (ROCEPHIN) 2 g in dextrose 5 % 50 mL IVPB  Status:  Discontinued        2 g 100 mL/hr over 30 Minutes Intravenous Every 24 hours 10/27/11 2220 10/28/11 0750   10/28/11 0552   levofloxacin (LEVAQUIN) IVPB 750 mg  Status:  Discontinued        750 mg 100 mL/hr over 90 Minutes Intravenous Every 24 hours 10/28/11 0552 10/28/11 0751   10/27/11 2230   levofloxacin (LEVAQUIN) IVPB 750 mg  Status:  Discontinued        750 mg 100 mL/hr over 90 Minutes Intravenous Every 24 hours 10/27/11 2220 10/28/11 0751   10/27/11 1845   cefTRIAXone (ROCEPHIN) injection 1 g        1 g Intramuscular  Once 10/27/11 1834 10/27/11 1902   10/27/11  1845   azithromycin (ZITHROMAX) 500 mg in dextrose 5 % 250 mL IVPB        500 mg 250 mL/hr over 60 Minutes Intravenous  Once 10/27/11 1834 10/27/11 2006          Assessment: 66 y.o. M with a history of ARVD s/p dual-chamber [employer]. Jude ICD presenting with sepsis likely due to RML pneumonia. The patient was started on Azithromycin + Rocephin in the ED but has since been transitioned to Levaquin alone. Dose remains appropriate at this time.   Pharmacy System-Based Medication Review Anticoagulation: Pradaxa PTA for hx Afib, now held so that lines can be withdrawn(received last dose at 0018 on 7/31). Cards aware -- waiting to pull lines on 8/1 -- need to f/u anticoag plans after. On SCDs Infectious Disease: Rocephin d/ced as no added coverage. Continues on Levaquin for empiric CAP coverage. Tmax/24h 101.1, WBC 10.2 << 12.4, SCr 1.52, CrCl~50-60 ml/min.   7/30 Blood Cx >> 7/30 MRSA PCR negative  Azithromycin 7/30 x 1  Ceftriaxone 7/30 >>> 7/31 Levaquin 7/30>>>  Cardiovascular: Hx Afib/ICD/RV-hyperplasia. HR:  60-110, BP variable, but able to maintain MAP on NE at 50 mcg/min, Dopa weaned off. Endocrinology: CBGs 108-112 (at goal of <180). No insulin orders yet.  Gastrointestinal / Nutrition: NPO Neurology: Acute encephalopathy. Paralyzed on nimbex. On home lexapro (Hx anxiety) Nephrology: SCr 1.55, CrCl~50-60 ml/min. Lytes ok Pulmonary: ARDS with persistent hypoxia ?Amio toxicity per CCM -- not likely per cards. Intubated, 98% on fiO2 60% Hematology / Oncology: Thombocytopenia, plts 120. Hgb/Hct ok PTA Medication Issues: Fish oil, amiodarone, pradaxa, lexapro, flonase, lopressor Best Practices: MC, lacrilube, SCDs   Goal of Therapy:  Eradication of infection  Plan:  1. Continue Levaquin 750 mg IV every 24 hours 2. Will continue to follow renal function, culture results, LOT, and antibiotic de-escalation plans   Georgina Pillion, PharmD, BCPS Clinical Pharmacist Pager: [phone] 10/28/2011 2:09 PM

## 2011-10-28 NOTE — Progress Notes (Signed)
Called to patient's room in emergency.  Patient appears to be acutely encephalopathic, saturating in low 80s while on 100% oxygen and in shock with systolic blood pressure of 60.  Pink frothy oral secretions noted.  Emergently intubated.  Right subclavian line attempted with dark nonnpulsatile blood return, however post procedure CXR was specious for arterial placement and arterial pressure was transduced.  The decisions was made not to withdraw the line as the patient received Pradaxa  around midnight.  Pharmacy contacted, Pradaxa d/c'd.  Per Pharmacy the drug will be eliminated in 12-18 hours at which time the line can be removed.  Right IG CVL placed under sonographic guidance.  Orlean Bradford, M.D., F.C.C.P. Pulmonary and Critical Care Medicine [facility] Cell: [phone] Pager: [phone]

## 2011-10-28 NOTE — Progress Notes (Signed)
eLink Physician-Brief Progress Note Patient Name: Robert Ochoa DOB: 1945/10/31 MRN: [id number]  Date of Service  10/28/2011   HPI/Events of Note   GPC in clusters in blood, fever and diaphoretic all day.  eICU Interventions  Vancomycin added IV, bedside MD to address.      YACOUB,WESAM 10/28/2011, 6:10 PM

## 2011-10-28 NOTE — ED Provider Notes (Signed)
History     CSN: [id number]  Arrival date & time 10/27/11  1708   First MD Initiated Contact with Patient 10/27/11 1722      Chief Complaint  Patient presents with  . Weakness    (Consider location/radiation/quality/duration/timing/severity/associated sxs/prior treatment) Patient is a 66 y.o. male presenting with weakness. The history is provided by the patient.  Weakness The primary symptoms include fever and nausea. Primary symptoms do not include headaches, syncope, loss of consciousness, altered mental status, seizures, dizziness, visual change, paresthesias, focal weakness, loss of sensation, speech change, memory loss or vomiting. The symptoms began 12 to 24 hours ago. The episode lasted 16 hours. The symptoms are worsening. The neurological symptoms are diffuse. The symptoms occurred after standing up.  Additional symptoms include weakness. Additional symptoms do not include neck stiffness or pain.    Past Medical History  Diagnosis Date  . Ventricular tachycardia     ICD discharges 05/2011 for this & AF-RVR - started on amiodarone then.  . Arrhythmogenic right ventricular dysplasia     Hx of VT s/p dual-chamber St. Jude ICD. Cardiac MRI 2010 showed moderately dilated RV with hypokinesis, LV EF 47%. EF 50-55% 2010 but down to 45% 05/2011. Cath 01/2009 without significant CAD.  [name] Atrial fibrillation or flutter     Paroxysmal. Started on Pradaxa 05/2011.  . Cardiac defibrillator in place   . Anxiety     Past Surgical History  Procedure Date  . Appendectomy   . Cardiac defibrillator placement 02/19/09     St,. Jude     Family History  Problem Relation Age of Onset  . Heart attack Father     History  Substance Use Topics  . Smoking status: Never Smoker   . Smokeless tobacco: Not on file  . Alcohol Use: Yes     social      Review of Systems  Constitutional: Positive for fever. Negative for activity change, appetite change and fatigue.  HENT: Negative for  congestion, sore throat, facial swelling, rhinorrhea, trouble swallowing, neck pain, neck stiffness, voice change and sinus pressure.   Eyes: Negative.   Respiratory: Positive for cough. Negative for choking, chest tightness, shortness of breath and wheezing.   Cardiovascular: Negative for chest pain and syncope.  Gastrointestinal: Positive for nausea. Negative for vomiting and abdominal pain.  Genitourinary: Negative for dysuria, urgency, frequency, hematuria, flank pain and difficulty urinating.  Musculoskeletal: Negative for back pain and gait problem.  Skin: Negative for rash and wound.  Neurological: Positive for weakness. Negative for dizziness, speech change, focal weakness, seizures, loss of consciousness, facial asymmetry, numbness, headaches and paresthesias.  Psychiatric/Behavioral: Negative for memory loss, behavioral problems, confusion, agitation and altered mental status. The patient is not nervous/anxious and is not hyperactive.   All other systems reviewed and are negative.    Allergies  Review of patient's allergies indicates no known allergies.  Home Medications  No current outpatient prescriptions on file.  BP 135/61  Pulse 56  Temp 97.8 F (36.6 C) (Oral)  Resp 19  Ht 6\' 2"  (1.88 m)  Wt 214 lb (97.07 kg)  BMI 27.48 kg/m2  SpO2 97%  Physical Exam  Nursing note and vitals reviewed. Constitutional: He is oriented to person, place, and time. He appears well-developed and well-nourished. No distress.       Patient appears to be feeling ill and has an ashen complexion  HENT:  Head: Normocephalic and atraumatic.  Right Ear: External ear normal.  Left Ear: External ear normal.  Mouth/Throat: No oropharyngeal exudate.  Eyes: Conjunctivae and EOM are normal. Pupils are equal, round, and reactive to light. Right eye exhibits no discharge. Left eye exhibits no discharge.  Neck: Normal range of motion. Neck supple. No JVD present. No tracheal deviation present. No  thyromegaly present.  Cardiovascular: Normal rate, regular rhythm, normal heart sounds and intact distal pulses.  Exam reveals no gallop and no friction rub.   No murmur heard. Pulmonary/Chest: Effort normal. No respiratory distress. He has no wheezes. He has rhonchi. He has rales. He exhibits no tenderness.       Patient with rhonchi Rales in the right lung  Abdominal: Soft. Bowel sounds are normal. He exhibits no distension. There is no tenderness. There is no rebound and no guarding.  Musculoskeletal: Normal range of motion. He exhibits no edema and no tenderness.  Lymphadenopathy:    He has no cervical adenopathy.  Neurological: He is alert and oriented to person, place, and time. No cranial nerve deficit.  Skin: Skin is warm and dry. No rash noted. He is not diaphoretic. No pallor.  Psychiatric: He has a normal mood and affect. His behavior is normal.    ED Course  Procedures (including critical care time)  Labs Reviewed  CBC WITH DIFFERENTIAL - Abnormal; Notable for the following:    Platelets 122 (*)     Neutrophils Relative 88 (*)     Lymphocytes Relative 5 (*)     Lymphs Abs 0.4 (*)     All other components within normal limits  COMPREHENSIVE METABOLIC PANEL - Abnormal; Notable for the following:    Glucose, Bld 112 (*)     BUN 26 (*)     Creatinine, Ser 1.60 (*)     Calcium 7.6 (*)     Total Protein 5.3 (*)     Albumin 2.6 (*)     Alkaline Phosphatase 34 (*)     Total Bilirubin 1.5 (*)     GFR calc non Af Amer 43 (*)     GFR calc Af Amer 50 (*)     All other components within normal limits  LACTIC ACID, PLASMA - Abnormal; Notable for the following:    Lactic Acid, Venous 5.0 (*)     All other components within normal limits  PROCALCITONIN  MRSA PCR SCREENING  URINALYSIS, ROUTINE W REFLEX MICROSCOPIC  CULTURE, BLOOD (ROUTINE X 2)  CULTURE, BLOOD (ROUTINE X 2)  PRO B NATRIURETIC PEPTIDE  CORTISOL  PROTIME-INR  APTT  STREP PNEUMONIAE URINARY ANTIGEN  LEGIONELLA  ANTIGEN, URINE  CBC  MAGNESIUM  COMPREHENSIVE METABOLIC PANEL  LACTIC ACID, PLASMA  LACTIC ACID, PLASMA  LACTIC ACID, PLASMA  CARDIAC PANEL(CRET KIN+CKTOT+MB+TROPI)  CARDIAC PANEL(CRET KIN+CKTOT+MB+TROPI)  LACTIC ACID, PLASMA  LACTIC ACID, PLASMA  LACTIC ACID, PLASMA  LACTIC ACID, PLASMA  CARDIAC PANEL(CRET KIN+CKTOT+MB+TROPI)   Dg Chest Port 1 View  10/27/2011  *RADIOLOGY REPORT*  Clinical Data: Weakness and dehydration.  PORTABLE CHEST - 1 VIEW  Comparison: 05/07/2011  Findings: There is a new opacity along the right cardiac border. The opacity is most likely within the right middle lobe.  Again noted is a left cardiac ICD.  Heart size is upper limits of normal but unchanged.  No evidence of pulmonary edema.  Left lung remains clear.  IMPRESSION: New opacity in the right middle lobe region.  Findings are concerning for pneumonia.  Recommend follow-up to ensure resolution.  Original Report Authenticated By: Richarda Overlie, M.D.     1. Sepsis  2. Acute renal failure   3. Community acquired pneumonia   4. Symptomatic bradycardia       MDM  CC external male patient with past medical history of ventricular tachycardia and defibrillator implantation presents with lightheadedness. Patient says last night he had fever to 102 shaking chills sweats has had a cough. Patient denies chest pain shortness of breath abdominal pain nausea vomiting headache neck pain. Patient with no meningismus or neck rigidity. Patient with normal TMs oropharynx no rashes no urinary symptoms no abdominal pain but does have Rales in the right lung. Chest x-ray shows right middle lung pneumonia. When patient arrived his blood pressure was systolically in the 60s. 2 L fluid we'll run rapidly with blood pressure wrist joint to 150s. Patient then became ashen nauseated with a paced rhythm in the 40s and was given atropine and heart rate recovered to the 60s and patient started to look better. Patient was given a third liter of  fluid and blood pressures continued to be systolically in the 70s and 80s with elevated lactate. Given the tenuous blood pressures in the heart rate problems patient was admitted to the ICU and cardiology was consulted to evaluate pacer rhythm only overriding a rate of 40. Patient was started on azithromycin and Rocephin. Cultures taken for antibiotics  Results for orders placed during the hospital encounter of 10/27/11  CBC WITH DIFFERENTIAL      Component Value Range   WBC 7.7  4.0 - 10.5 K/uL   RBC 4.63  4.22 - 5.81 MIL/uL   Hemoglobin 13.6  13.0 - 17.0 g/dL   HCT 16.1  09.6 - 04.5 %   MCV 87.5  78.0 - 100.0 fL   MCH 29.4  26.0 - 34.0 pg   MCHC 33.6  30.0 - 36.0 g/dL   RDW 40.9  81.1 - 91.4 %   Platelets 122 (*) 150 - 400 K/uL   Neutrophils Relative 88 (*) 43 - 77 %   Neutro Abs 6.8  1.7 - 7.7 K/uL   Lymphocytes Relative 5 (*) 12 - 46 %   Lymphs Abs 0.4 (*) 0.7 - 4.0 K/uL   Monocytes Relative 7  3 - 12 %   Monocytes Absolute 0.5  0.1 - 1.0 K/uL   Eosinophils Relative 0  0 - 5 %   Eosinophils Absolute 0.0  0.0 - 0.7 K/uL   Basophils Relative 0  0 - 1 %   Basophils Absolute 0.0  0.0 - 0.1 K/uL  COMPREHENSIVE METABOLIC PANEL      Component Value Range   Sodium 139  135 - 145 mEq/L   Potassium 4.7  3.5 - 5.1 mEq/L   Chloride 107  96 - 112 mEq/L   CO2 19  19 - 32 mEq/L   Glucose, Bld 112 (*) 70 - 99 mg/dL   BUN 26 (*) 6 - 23 mg/dL   Creatinine, Ser 7.82 (*) 0.50 - 1.35 mg/dL   Calcium 7.6 (*) 8.4 - 10.5 mg/dL   Total Protein 5.3 (*) 6.0 - 8.3 g/dL   Albumin 2.6 (*) 3.5 - 5.2 g/dL   AST 29  0 - 37 U/L   ALT 19  0 - 53 U/L   Alkaline Phosphatase 34 (*) 39 - 117 U/L   Total Bilirubin 1.5 (*) 0.3 - 1.2 mg/dL   GFR calc non Af Amer 43 (*) >90 mL/min   GFR calc Af Amer 50 (*) >90 mL/min  PROCALCITONIN      Component Value  Range   Procalcitonin 17.08    LACTIC ACID, PLASMA      Component Value Range   Lactic Acid, Venous 5.0 (*) 0.5 - 2.2 mmol/L  MRSA PCR SCREENING       Component Value Range   MRSA by PCR NEGATIVE  NEGATIVE   DG Chest Port 1 View (Final result)   Result time:10/27/11 1754    Final result by Rad Results In Interface (10/27/11 17:54:51)    Narrative:   *RADIOLOGY REPORT*  Clinical Data: Weakness and dehydration.  PORTABLE CHEST - 1 VIEW  Comparison: 05/07/2011  Findings: There is a new opacity along the right cardiac border. The opacity is most likely within the right middle lobe. Again noted is a left cardiac ICD. Heart size is upper limits of normal but unchanged. No evidence of pulmonary edema. Left lung remains clear.  IMPRESSION: New opacity in the right middle lobe region. Findings are concerning for pneumonia. Recommend follow-up to ensure resolution.  Original Report Authenticated By: Richarda Overlie, M.D.    Date: 10/28/2011  Rate: 63  Rhythm: normal sinus rhythm  QRS Axis: normal  Intervals: normal  ST/T Wave abnormalities: normal  Conduction Disutrbances:first-degree A-V block   Narrative Interpretation:   Old EKG Reviewed: unchanged    Case discussed with Dr. Blanche East, MD 10/28/11 [phone]

## 2011-10-28 NOTE — Procedures (Signed)
Name:  [name] MRN:  [id number] DOB:  1945-11-08  PROCEDURE NOTE  Procedure:  Ultrasound-guided central venous catheter placement.  Indications:  Need for intravenous access and hemodynamic monitoring.  Consent:  Consent was implied due to the emergency nature of the procedure.  Anesthesia:  A total of 10 mL of 1% Lidocaine was used for local infiltration anesthesia.  Procedure summary:  Appropriate equipment was assembled.  The patient was identified as Robert Ochoa and safety timeout was performed. The patient was placed in Trendelenburg position.  Sterile technique was used. The patient's right neck region was prepped using chlorhexidine / alcohol scrub and the field was draped in usual sterile fashion with full body drape. The right internal jugular vein and the right carotid artery were identified by ultrasound, the patency was evaluated and images were documented. After the adequate anesthesia was achieved, the vein was cannulated with the introducer needle under sonographic guidance without difficulty. A guide wire was advanced through the introducer needle, which was then withdrawn. A small skin incision was made at the point of wire entry, the dilator was inserted over the guide wire and appropriate dilation was obtained. The dilator was removed and triple-lumen catheter was advanced over the guide wire, which was then removed.  All ports were aspirated and flushed with normal saline without difficulty. The catheter was secured into place with sutures. Antibiotic patch was placed and sterile dressing was applied. Post-procedure chest x-ray was ordered.  Complications:  No immediate complications were noted.  Hemodynamic parameters and oxygenation remained stable throughout the procedure.  Estimated blood loss:  Less then 5 mL.  Orlean Bradford, M.D. Pulmonary and Critical Care Medicine [facility] Cell: [phone] Pager: [phone]  10/28/2011, 2:27 AM

## 2011-10-28 NOTE — Progress Notes (Signed)
SUBJECTIVE: The patient is ill and intubated at this time.  He is bradycardic and hypotensive in the setting of pneumonia and acute respiratory failure.      [name] amiodarone  200 mg Oral Daily  . antiseptic oral rinse  15 mL Mouth Rinse QID  . atropine      . atropine      . azithromycin  500 mg Intravenous Once  . cefTRIAXone (ROCEPHIN)  IV  1 g Intravenous Q24H  . cefTRIAXone (ROCEPHIN) IM  1 g Intramuscular Once  . chlorhexidine  15 mL Mouth Rinse BID  . escitalopram  20 mg Oral Daily  . etomidate  20 mg Intravenous Once  . fentaNYL      . fentaNYL      . fentaNYL  400 mcg Intravenous Once  . haloperidol lactate      . levofloxacin (LEVAQUIN) IV  750 mg Intravenous Q24H  . lidocaine      . midazolam      . midazolam      . midazolam  8 mg Intravenous Once  . ondansetron      . ondansetron      . pantoprazole (PROTONIX) IV  40 mg Intravenous Q24H  . sodium bicarbonate      . sodium chloride  1,000 mL Intravenous Once  . sodium chloride  2,000 mL Intravenous Once  . vecuronium      . DISCONTD: cefTRIAXone (ROCEPHIN)  IV  2 g Intravenous Q24H  . DISCONTD: dabigatran  150 mg Oral Q12H  . DISCONTD: levofloxacin (LEVAQUIN) IV  750 mg Intravenous Q24H  . DISCONTD: levofloxacin (LEVAQUIN) IV  750 mg Intravenous Q24H  . DISCONTD: rocuronium  0.6 mg/kg Intravenous Once      . cisatracurium (NIMBEX) infusion 0.5 mcg/kg/min (10/28/11 0541)  . DOPamine 10 mcg/kg/min (10/28/11 0848)  . fentaNYL infusion INTRAVENOUS 200 mcg/hr (10/28/11 0500)  . midazolam (VERSED) infusion 7 mg/hr (10/28/11 0500)  . norepinephrine (LEVOPHED) Adult infusion 10 mcg/min (10/28/11 0847)  .  sodium bicarbonate infusion 1000 mL    . DISCONTD: sodium chloride Stopped (10/28/11 0400)  . DISCONTD: norepinephrine (LEVOPHED) Adult infusion      OBJECTIVE: Physical Exam: Filed Vitals:   10/28/11 0600 10/28/11 0700 10/28/11 0800 10/28/11 0815  BP: 134/78 100/65 88/55 74/48   Pulse: 82 65 67 68  Temp:    101.1 F (38.4 C)   TempSrc:   Oral   Resp: 30 30 25 22   Height:      Weight:      SpO2: 99% 94% 93% 92%    Intake/Output Summary (Last 24 hours) at 10/28/11 0947 Last data filed at 10/28/11 0800  Gross per 24 hour  Intake 1177.05 ml  Output   1360 ml  Net -182.95 ml    Telemetry reveals junctional rhythm with occasional sinus bradycardia  GEN- The patient is ill appearing, sedated on the vent Head- normocephalic, atraumatic Eyes-  Sclera clear, conjunctiva pink Oropharynx- ETT in place Neck- supple,  R IJ cath in place Lungs- Clear to ausculation bilaterally, normal work of breathing Heart- brady regular rhythm, no murmurs, rubs or gallops, PMI not laterally displaced GI- soft, NT, ND, + BS Extremities- no clubbing, cyanosis, or edema Skin- no rash or lesion Psych- sedated Neuro- sedated  LABS: Basic Metabolic Panel:  Basename 10/28/11 0325 10/27/11 1733  NA 139 139  K 4.8 4.7  CL 110 107  CO2 16* 19  GLUCOSE 108* 112*  BUN 27* 26*  CREATININE 1.55*  1.60*  CALCIUM 7.9* 7.6*  MG 1.4* --  PHOS -- --   Liver Function Tests:  [facility] 10/28/11 0325 10/27/11 1733  AST 74* 29  ALT 57* 19  ALKPHOS 33* 34*  BILITOT 1.4* 1.5*  PROT 5.7* 5.3*  ALBUMIN 2.7* 2.6*   No results found for this basename: LIPASE:2,AMYLASE:2 in the last 72 hours CBC:  Basename 10/28/11 0325 10/27/11 1733  WBC 12.4* 7.7  NEUTROABS -- 6.8  HGB 14.4 13.6  HCT 42.4 40.5  MCV 86.9 87.5  PLT 120* 122*   Cardiac Enzymes:  Basename 10/28/11 0241  CKTOTAL 465*  CKMB 7.9*  CKMBINDEX --  TROPONINI <0.30   RADIOLOGY: Dg Chest Port 1 View  10/28/2011  *RADIOLOGY REPORT*  Clinical Data: Central line placement  PORTABLE CHEST - 1 VIEW  Comparison: 10/28/2011  Findings: Right IJ catheter tip projects over the proximal SVC. Right subclavian catheter tip again projects over the midchest, indeterminate location.  Otherwise no interval change in support devices.  The carina is difficult to  visualize.  Cardiomegaly. Bibasilar opacities and large effusions.  No pneumothorax.  IMPRESSION: Right IJ catheter tip projects over the proximal SVC.  Right subclavian catheter is unchanged, projecting over midline, indeterminate location.  Original Report Authenticated By: Waneta Martins, M.D.   Dg Chest Port 1 View  10/28/2011  *RADIOLOGY REPORT*  Clinical Data: Endotracheal tube and central line placement.  PORTABLE CHEST - 1 VIEW  Comparison: 10/27/2011  Findings: Endotracheal tube has been placed, tip approximately 5.9 cm above carina.  The patient has a new right central line, tip overlying the midline.  This raises a question of location of 10. Assessment for blood return is recommended.  The heart is enlarged.  There are diffuse changes of pulmonary edema.  The patient has a left-sided transvenous pacemaker, leads to the right atrium and right ventricle.  IMPRESSION:  1.  Right-sided central line as described.  The tip cannot be confirmed to be in the superior vena cava.  Assess for blood return is recommended. 2.  Endotracheal tube as described.  Critical test results telephoned to Drs. Hunter and Zubelevitskiy at the time of interpretation on date 10/28/2011 at time 1:53 a.m.  Original Report Authenticated By: Patterson Hammersmith, M.D.   Dg Chest Port 1 View  10/27/2011  *RADIOLOGY REPORT*  Clinical Data: Weakness and dehydration.  PORTABLE CHEST - 1 VIEW  Comparison: 05/07/2011  Findings: There is a new opacity along the right cardiac border. The opacity is most likely within the right middle lobe.  Again noted is a left cardiac ICD.  Heart size is upper limits of normal but unchanged.  No evidence of pulmonary edema.  Left lung remains clear.  IMPRESSION: New opacity in the right middle lobe region.  Findings are concerning for pneumonia.  Recommend follow-up to ensure resolution.  Original Report Authenticated By: Richarda Overlie, M.D.    ASSESSMENT AND PLAN:  Active Problems:  Atrial  fibrillation  Right ventricular dysplasia  Sepsis  Community acquired pneumonia  Symptomatic bradycardia  Acute renal failure  Acute respiratory failure with hypoxia  Septic shock  1. ARVD- pt with arrhythmogenic RV dyplasia.  Echo 5/13 revealed mild to moderate RV dysfunction I think that given his acute illness we should repeat echo at this time. Previously his VT has been precipitated by increased catechols and stress.  He is therefore at increased risk for ventricular arrhythmias during his present illness. Given increased O2, I think that we should hold amiodarone  at this time. Restart beta blockers when BP allows. Wean dopamine to off if able, preferentially using alpha agonists for pressor support.  2. AFib- presently in sinus Hold pradaxa for now  3. Bradycardia-  Pt with junctional rhythm I have reviewed ICD interrogation in detail.  At this time, We will increase pacing rate to 80 bpm to provide better hemodynamics.  I would anticipate that we will decrease his pacing rate once his acute illness resolves  4. Pneumonia/ Acute respiratory failure Per PCCM Dr Vassie Loll does not feel that amiodarone toxicity is the issue here. I do think that we should try to hold amiodarone for the time being and restart once he recovers.  5. Shock- likely due to acute respiratory failure Increasing pacing rate as above Obtain an echo Keep CVP >8 Alpha agonists favored over dopamine/dobutamine due to risks of afib and VT Remove central lines when able to avoid bacteremia/ ICD system infection  I have discussed the above with Dr Vassie Loll. Total critical care time spent with patient 30 minutes.    Hillis Range, MD 10/28/2011 9:47 AM

## 2011-10-29 ENCOUNTER — Inpatient Hospital Stay (HOSPITAL_COMMUNITY): Payer: Medicare Other

## 2011-10-29 LAB — BASIC METABOLIC PANEL
BUN: 23 mg/dL (ref 6–23)
CO2: 27 mEq/L (ref 19–32)
Chloride: 103 mEq/L (ref 96–112)
Creatinine, Ser: 1.19 mg/dL (ref 0.50–1.35)
Glucose, Bld: 183 mg/dL — ABNORMAL HIGH (ref 70–99)

## 2011-10-29 LAB — CBC
HCT: 39.7 % (ref 39.0–52.0)
MCH: 29 pg (ref 26.0–34.0)
MCHC: 33.5 g/dL (ref 30.0–36.0)
MCV: 86.5 fL (ref 78.0–100.0)
RDW: 14.4 % (ref 11.5–15.5)

## 2011-10-29 LAB — BLOOD GAS, ARTERIAL
Bicarbonate: 27.3 mEq/L — ABNORMAL HIGH (ref 20.0–24.0)
Drawn by: 313061
PEEP: 5 cmH2O
Patient temperature: 98.6
RATE: 26 resp/min
pH, Arterial: 7.462 — ABNORMAL HIGH (ref 7.350–7.450)
pO2, Arterial: 98.9 mmHg (ref 80.0–100.0)

## 2011-10-29 LAB — POCT I-STAT 3, ART BLOOD GAS (G3+)
pCO2 arterial: 40.6 mmHg (ref 35.0–45.0)
pO2, Arterial: 59 mmHg — ABNORMAL LOW (ref 80.0–100.0)

## 2011-10-29 LAB — GLUCOSE, CAPILLARY
Glucose-Capillary: 163 mg/dL — ABNORMAL HIGH (ref 70–99)
Glucose-Capillary: 120 mg/dL — ABNORMAL HIGH (ref 70–99)

## 2011-10-29 LAB — PROTIME-INR: Prothrombin Time: 18.6 seconds — ABNORMAL HIGH (ref 11.6–15.2)

## 2011-10-29 LAB — MAGNESIUM: Magnesium: 2.1 mg/dL (ref 1.5–2.5)

## 2011-10-29 LAB — APTT: aPTT: 40 seconds — ABNORMAL HIGH (ref 24–37)

## 2011-10-29 MED ORDER — MIDAZOLAM HCL 2 MG/2ML IJ SOLN
INTRAMUSCULAR | Status: AC
  Filled 2011-10-29: qty 4

## 2011-10-29 MED ORDER — MIDAZOLAM HCL 2 MG/2ML IJ SOLN
4.0000 mg | Freq: Once | INTRAMUSCULAR | Status: AC
  Administered 2011-10-29: 4 mg via INTRAVENOUS

## 2011-10-29 MED ORDER — MIDAZOLAM BOLUS VIA INFUSION
2.0000 mg | INTRAVENOUS | Status: DC | PRN
  Filled 2011-10-29: qty 4

## 2011-10-29 MED ORDER — PRO-STAT SUGAR FREE PO LIQD
30.0000 mL | Freq: Two times a day (BID) | ORAL | Status: DC
  Administered 2011-10-29: 30 mL
  Filled 2011-10-29 (×4): qty 30

## 2011-10-29 MED ORDER — JEVITY 1.2 CAL PO LIQD
1000.0000 mL | ORAL | Status: DC
  Filled 2011-10-29 (×2): qty 1000

## 2011-10-29 MED ORDER — SODIUM PHOSPHATE 3 MMOLE/ML IV SOLN
30.0000 mmol | Freq: Once | INTRAVENOUS | Status: AC
  Administered 2011-10-29: 30 mmol via INTRAVENOUS
  Filled 2011-10-29: qty 10

## 2011-10-29 MED ORDER — PANTOPRAZOLE SODIUM 40 MG PO PACK
40.0000 mg | PACK | Freq: Every day | ORAL | Status: DC
  Filled 2011-10-29: qty 20

## 2011-10-29 MED ORDER — MIDAZOLAM HCL 2 MG/2ML IJ SOLN
2.0000 mg | Freq: Once | INTRAMUSCULAR | Status: AC
  Administered 2011-10-29: 2 mg via INTRAVENOUS

## 2011-10-29 MED ORDER — MIDAZOLAM HCL 2 MG/2ML IJ SOLN
INTRAMUSCULAR | Status: AC
  Filled 2011-10-29: qty 2

## 2011-10-29 NOTE — Progress Notes (Signed)
Pt self extubated, Placed on 100% NRB. BBS equal diminished SPO2 97%. Pt is talking with no distress noted at this time. RT will continue to monitor

## 2011-10-29 NOTE — Progress Notes (Signed)
Subjective: Patient intubated.  Responds.  Agitated at times. Objective: Filed Vitals:   10/29/11 1400 10/29/11 1500 10/29/11 1600 10/29/11 1700  BP: 125/85 119/74 119/77 123/76  Pulse: 88 79 83 81  Temp:   99.3 F (37.4 C)   TempSrc:   Oral   Resp: 25 25 15 24   Height:      Weight:      SpO2: 98% 100% 98% 100%   Weight change: 26 lb 15.4 oz (12.23 kg)  Intake/Output Summary (Last 24 hours) at 10/29/11 1801 Last data filed at 10/29/11 1700  Gross per 24 hour  Intake 3223.23 ml  Output   1390 ml  Net 1833.23 ml    General: Intubated   SBP 90s to 120s. Neck:  JVP diff to assess Heart: Irregular rate and rhythm, without murmurs, rubs, gallops.  Lungs: Clear to auscultation.  No rales or wheezes. Exemities:  Tr edema.      Lab Results: Results for orders placed during the hospital encounter of 10/27/11 (from the past 24 hour(s))  POCT I-STAT 3, BLOOD GAS (G3+)     Status: Abnormal   Collection Time   10/28/11  6:10 PM      Component Value Range   pH, Arterial 7.353  7.350 - 7.450   pCO2 arterial 39.5  35.0 - 45.0 mmHg   pO2, Arterial 56.0 (*) 80.0 - 100.0 mmHg   Bicarbonate 22.0  20.0 - 24.0 mEq/L   TCO2 23  0 - 100 mmol/L   O2 Saturation 87.0     Acid-base deficit 3.0 (*) 0.0 - 2.0 mmol/L   Collection site ARTERIAL LINE     Drawn by Operator     Sample type ARTERIAL    CARDIAC PANEL(CRET KIN+CKTOT+MB+TROPI)     Status: Abnormal   Collection Time   10/28/11  6:48 PM      Component Value Range   Total CK 258 (*) 7 - 232 U/L   CK, MB 5.0 (*) 0.3 - 4.0 ng/mL   Troponin I <0.30  <0.30 ng/mL   Relative Index 1.9  0.0 - 2.5  GLUCOSE, CAPILLARY     Status: Abnormal   Collection Time   10/28/11  8:08 PM      Component Value Range   Glucose-Capillary 155 (*) 70 - 99 mg/dL   Comment 1 Documented in Chart     Comment 2 Notify RN    GLUCOSE, CAPILLARY     Status: Abnormal   Collection Time   10/28/11 11:31 PM      Component Value Range   Glucose-Capillary 156 (*) 70 -  99 mg/dL  GLUCOSE, CAPILLARY     Status: Abnormal   Collection Time   10/29/11  4:00 AM      Component Value Range   Glucose-Capillary 163 (*) 70 - 99 mg/dL  MAGNESIUM     Status: Normal   Collection Time   10/29/11  4:50 AM      Component Value Range   Magnesium 2.1  1.5 - 2.5 mg/dL  PHOSPHORUS     Status: Abnormal   Collection Time   10/29/11  4:50 AM      Component Value Range   Phosphorus 1.7 (*) 2.3 - 4.6 mg/dL  BASIC METABOLIC PANEL     Status: Abnormal   Collection Time   10/29/11  4:50 AM      Component Value Range   Sodium 141  135 - 145 mEq/L   Potassium 3.7  3.5 -  5.1 mEq/L   Chloride 103  96 - 112 mEq/L   CO2 27  19 - 32 mEq/L   Glucose, Bld 183 (*) 70 - 99 mg/dL   BUN 23  6 - 23 mg/dL   Creatinine, Ser 4.54  0.50 - 1.35 mg/dL   Calcium 8.1 (*) 8.4 - 10.5 mg/dL   GFR calc non Af Amer 62 (*) >90 mL/min   GFR calc Af Amer 72 (*) >90 mL/min  CBC     Status: Abnormal   Collection Time   10/29/11  4:50 AM      Component Value Range   WBC 12.4 (*) 4.0 - 10.5 K/uL   RBC 4.59  4.22 - 5.81 MIL/uL   Hemoglobin 13.3  13.0 - 17.0 g/dL   HCT 09.8  11.9 - 14.7 %   MCV 86.5  78.0 - 100.0 fL   MCH 29.0  26.0 - 34.0 pg   MCHC 33.5  30.0 - 36.0 g/dL   RDW 82.9  56.2 - 13.0 %   Platelets 113 (*) 150 - 400 K/uL  POCT I-STAT 3, BLOOD GAS (G3+)     Status: Abnormal   Collection Time   10/29/11  4:52 AM      Component Value Range   pH, Arterial 7.413  7.350 - 7.450   pCO2 arterial 40.6  35.0 - 45.0 mmHg   pO2, Arterial 59.0 (*) 80.0 - 100.0 mmHg   Bicarbonate 25.9 (*) 20.0 - 24.0 mEq/L   TCO2 27  0 - 100 mmol/L   O2 Saturation 90.0     Acid-Base Excess 1.0  0.0 - 2.0 mmol/L   Patient temperature 98.6 F     Collection site ARTERIAL LINE     Drawn by Nurse     Sample type ARTERIAL    GLUCOSE, CAPILLARY     Status: Abnormal   Collection Time   10/29/11  7:38 AM      Component Value Range   Glucose-Capillary 165 (*) 70 - 99 mg/dL  APTT     Status: Abnormal   Collection Time    10/29/11  9:30 AM      Component Value Range   aPTT 40 (*) 24 - 37 seconds  PROTIME-INR     Status: Abnormal   Collection Time   10/29/11  9:30 AM      Component Value Range   Prothrombin Time 18.6 (*) 11.6 - 15.2 seconds   INR 1.52 (*) 0.00 - 1.49  BLOOD GAS, ARTERIAL     Status: Abnormal   Collection Time   10/29/11 10:35 AM      Component Value Range   FIO2 0.50     Delivery systems VENTILATOR     Mode PRESSURE REGULATED VOLUME CONTROL     VT 490     Rate 26     Peep/cpap 5.0     pH, Arterial 7.462 (*) 7.350 - 7.450   pCO2 arterial 38.7  35.0 - 45.0 mmHg   pO2, Arterial 98.9  80.0 - 100.0 mmHg   Bicarbonate 27.3 (*) 20.0 - 24.0 mEq/L   TCO2 28.4  0 - 100 mmol/L   Acid-Base Excess 3.6 (*) 0.0 - 2.0 mmol/L   O2 Saturation 97.8     Patient temperature 98.6     Collection site A-LINE     Drawn by [id number]     Sample type ARTERIAL DRAW    GLUCOSE, CAPILLARY     Status: Abnormal   Collection Time  10/29/11 11:33 AM      Component Value Range   Glucose-Capillary 120 (*) 70 - 99 mg/dL    Studies/Results:   Medications: Reviewed.   Patient Active Hospital Problem List: Atrial fibrillation (03/25/2009)   Assessment: Now in afib.  Rates controlled.  I would hold bblocker tonight.  Reassess pressures in AM Amio held with pulm issues.  Reassess in AM Continue to hold pradaxa for now.  Right ventricular dysplasia (09/22/2010)   Assessment: No signif tachyarrhythmias.  S/p ST Jude ICD    Sepsis (10/27/2011)   Assessment: Per ccm  Symptomatic bradycardia (10/27/2011)   Assessment: Pacer rate increased yesterday to 80  No change for now.  Acute respiratory failure with hypoxia (10/28/2011)   Assessment: Remains intubated.  Septic shock (10/28/2011)   Assessment: Off pressors.    LOS: 2 days   Dietrich Pates 10/29/2011, 6:01 PM

## 2011-10-29 NOTE — Progress Notes (Signed)
Chaplain Note:  Chaplain met with pt and pt's ex-wife.  Pt was in bed, intubated but aware to the degree that he could communicate through slight head motions.  Pt's ex-wife was at bedside.   Chaplain provided spiritual comfort, support, and prayer for pt and visitor.  Both expressed appreciation for chaplain support. Chaplain will follow up as needed.  10/29/11 1400  Clinical Encounter Type  Visited With Patient and family together  Visit Type Spiritual support;Initial  Referral From Nurse  Spiritual Encounters  Spiritual Needs Emotional;Prayer  Stress Factors  Patient Stress Factors Major life changes;Financial concerns  Family Stress Factors Major life changes;Family relationships   Verdie Shire, chaplain resident [phone]

## 2011-10-29 NOTE — Progress Notes (Signed)
INITIAL ADULT NUTRITION ASSESSMENT Date: 10/29/2011   Time: 10:43 AM  Reason for Assessment: VDRF; Consult for TF initiation and management  INTERVENTION: Start TF with Jevity 1.2 at 25 ml/h, increase by 10 ml every 4 hours to goal rate of 65 ml/h with Prostat 30 ml BID to provide 2072 kcals, 117 grams protein, 1264 ml free water daily.     DOCUMENTATION CODES Per approved criteria  -Obesity Unspecified    ASSESSMENT: Male 66 y.o.  Dx: Community acquired pneumonia with sepsis  Hx:  Past Medical History  Diagnosis Date  . Ventricular tachycardia     ICD discharges 05/2011 for this & AF-RVR - started on amiodarone then.  . Arrhythmogenic right ventricular dysplasia     Hx of VT s/p dual-chamber [employer] Jude ICD. Cardiac MRI 2010 showed moderately dilated RV with hypokinesis, LV EF 47%. EF 50-55% 2010 but down to 45% 05/2011. Cath 01/2009 without significant CAD.  [name] Atrial fibrillation or flutter     Paroxysmal. Started on Pradaxa 05/2011.  . Cardiac defibrillator in place   . Anxiety    Past Surgical History  Procedure Date  . Appendectomy   . Cardiac defibrillator placement 02/19/09     St,. Jude     Related Meds:  Scheduled Meds:   . antiseptic oral rinse  15 mL Mouth Rinse QID  . atropine      . chlorhexidine  15 mL Mouth Rinse BID  . escitalopram  20 mg Oral Daily  . insulin aspart  0-4 Units Subcutaneous Q4H  . levofloxacin (LEVAQUIN) IV  750 mg Intravenous Q24H  . magnesium sulfate 1 - 4 g bolus IVPB  4 g Intravenous Once  . pantoprazole (PROTONIX) IV  40 mg Intravenous Q24H  . sodium bicarbonate  50 mEq Intravenous Once  . sodium phosphate  Dextrose 5% IVPB  30 mmol Intravenous Once  . vancomycin  1,750 mg Intravenous Once  . vancomycin  1,000 mg Intravenous Q12H  . vecuronium      . DISCONTD: artificial tears   Both Eyes Q8H  . DISCONTD: cefTRIAXone (ROCEPHIN)  IV  1 g Intravenous Q24H  . DISCONTD: hydrocortisone sod succinate (SOLU-CORTEF) injection  50 mg  Intravenous Q8H   Continuous Infusions:   . dextrose    . fentaNYL infusion INTRAVENOUS 150 mcg/hr (10/29/11 0620)  . norepinephrine (LEVOPHED) Adult infusion Stopped (10/29/11 0915)  . vasopressin (PITRESSIN) infusion - *FOR SHOCK* 0.03 Units/min (10/28/11 1130)  . DISCONTD: cisatracurium (NIMBEX) infusion Stopped (10/28/11 1119)  . DISCONTD: DOBUTamine Stopped (10/29/11 0925)  . DISCONTD: DOPamine Stopped (10/28/11 1119)  . DISCONTD: fentaNYL infusion INTRAVENOUS 150 mcg/hr (10/28/11 1306)  . DISCONTD: midazolam (VERSED) infusion Stopped (10/28/11 1542)  . DISCONTD: midazolam (VERSED) infusion Stopped (10/29/11 0800)  . DISCONTD:  sodium bicarbonate infusion 1000 mL Stopped (10/29/11 0925)   PRN Meds:.sodium chloride, albuterol, dextrose, fentaNYL, fluticasone, midazolam, ondansetron (ZOFRAN) IV, promethazine, DISCONTD: fentaNYL, DISCONTD: haloperidol lactate, DISCONTD: haloperidol lactate, DISCONTD: midazolam, DISCONTD: midazolam   Ht: 6\' 2"  (188 cm)  Wt: 240 lb 15.4 oz (109.3 kg)  Ideal Wt: 86.4 kg % Ideal Wt: 127%  Wt Readings from Last 8 Encounters:  10/29/11 240 lb 15.4 oz (109.3 kg)  06/25/11 210 lb 12.8 oz (95.618 kg)  05/08/11 220 lb 0.3 oz (99.8 kg)  09/22/10 224 lb (101.606 kg)  03/05/10 227 lb 4 oz (103.08 kg)  12/04/09 224 lb (101.606 kg)  09/02/09 221 lb (100.245 kg)  05/31/09 218 lb (98.884 kg)   Usual Wt:  210 lb (4 months ago) % Usual Wt: 114%  Body mass index is 30.94 kg/(m^2).  Food/Nutrition Related Hx: No nutrition problems identified on admission nutrition screen.  Labs:  CMP     Component Value Date/Time   NA 141 10/29/2011 0450   K 3.7 10/29/2011 0450   CL 103 10/29/2011 0450   CO2 27 10/29/2011 0450   GLUCOSE 183* 10/29/2011 0450   BUN 23 10/29/2011 0450   CREATININE 1.19 10/29/2011 0450   CREATININE 1.19 06/25/2011 1544   CALCIUM 8.1* 10/29/2011 0450   PROT 5.7* 10/28/2011 0325   ALBUMIN 2.7* 10/28/2011 0325   AST 74* 10/28/2011 0325   ALT 57*  10/28/2011 0325   ALKPHOS 33* 10/28/2011 0325   BILITOT 1.4* 10/28/2011 0325   GFRNONAA 62* 10/29/2011 0450   GFRAA 72* 10/29/2011 0450    CBG (last 3)   Basename 10/29/11 0738 10/29/11 0400 10/28/11 2331  GLUCAP 165* 163* 156*    Magnesium  Date/Time Value Range Status  10/29/2011  4:50 AM 2.1  1.5 - 2.5 mg/dL Final  0/86/5784  6:96 AM 1.4* 1.5 - 2.5 mg/dL Final  05/08/5282  1:32 PM 2.1  1.5 - 2.5 mg/dL Final   Phosphorus  Date/Time Value Range Status  10/29/2011  4:50 AM 1.7* 2.3 - 4.6 mg/dL Final     Intake/Output Summary (Last 24 hours) at 10/29/11 1049 Last data filed at 10/29/11 0925  Gross per 24 hour  Intake 4231.63 ml  Output   1850 ml  Net 2381.63 ml    Diet Order:  NPO; Jevity 1.2 with goal rate of 40 ml/h per Adult Enteral Nutrition Protocol  IVF:    dextrose   fentaNYL infusion INTRAVENOUS Last Rate: 150 mcg/hr (10/29/11 0620)  norepinephrine (LEVOPHED) Adult infusion Last Rate: Stopped (10/29/11 0915)  vasopressin (PITRESSIN) infusion - *FOR SHOCK* Last Rate: 0.03 Units/min (10/28/11 1130)  DISCONTD: cisatracurium (NIMBEX) infusion Last Rate: Stopped (10/28/11 1119)  DISCONTD: DOBUTamine Last Rate: Stopped (10/29/11 0925)  DISCONTD: DOPamine Last Rate: Stopped (10/28/11 1119)  DISCONTD: fentaNYL infusion INTRAVENOUS Last Rate: 150 mcg/hr (10/28/11 1306)  DISCONTD: midazolam (VERSED) infusion Last Rate: Stopped (10/28/11 1542)  DISCONTD: midazolam (VERSED) infusion Last Rate: Stopped (10/29/11 0800)  DISCONTD:  sodium bicarbonate infusion 1000 mL Last Rate: Stopped (10/29/11 0925)    Estimated Nutritional Needs:   Kcal: 2130 Protein: 115-130 grams Fluid: 2.1-2.3 liters  Per discussion in rounds this morning, plans are to place a NG/OG tube and start TF.  Patient meets criteria for obesity, class 1 with BMI = 30.9.  Question if weight gain is related to fluid overload.  I/O positive ~2.4 liters over the past 24 hours.  Do not feel that patient would benefit from  permissive underfeeding since weight is likely above usual weight due to fluid overload.  No need to restrict fluid in TF per discussion with MD.    NUTRITION DIAGNOSIS: -Inadequate oral intake (NI-2.1).  Status: Ongoing  RELATED TO: inability to eat  AS EVIDENCED BY: NPO status  MONITORING/EVALUATION(Goals): Goal:  Intake to meet 90-100% of estimated nutrition needs. Monitor:  TF tolerance/adequacy, weight trend, labs, I/O  EDUCATION NEEDS: -Education not appropriate at this time   Joaquin Courts, RD, CNSC, LDN Pager# [phone] After Hours Pager# [phone]  10/29/2011, 10:43 AM

## 2011-10-29 NOTE — Progress Notes (Signed)
Pharmacist Heart Failure Core Measure Documentation  Assessment: Jaquaveon Bilal has an EF documented as 35% on 10/28/11 by ECHO.  Rationale: Heart failure patients with left ventricular systolic dysfunction (LVSD) and an EF < 40% should be prescribed an angiotensin converting enzyme inhibitor (ACEI) or angiotensin receptor blocker (ARB) at discharge unless a contraindication is documented in the medical record.  This patient is not currently on an ACEI or ARB for HF.  This note is being placed in the record in order to provide documentation that a contraindication to the use of these agents is present for this encounter.  ACE Inhibitor or Angiotensin Receptor Blocker is contraindicated (specify all that apply)  []   ACEI allergy AND ARB allergy []   Angioedema []   Moderate or severe aortic stenosis []   Hyperkalemia [x]   Hypotension []   Renal artery stenosis []   Worsening renal function, preexisting renal disease or dysfunction   Fayne Norrie 10/29/2011 2:09 PM

## 2011-10-29 NOTE — Progress Notes (Signed)
Name: Robert Ochoa MRN: [id number] DOB: Apr 28, 1945    LOS: 2  Referring Provider:  EDP Reason for Referral:  Suspected sepsis Cards - [facility]  PULMONARY / CRITICAL CARE MEDICINE  Brief patient description:  66 year old male with a history of ARVD (Arrhythmogenic right ventricular dysplasia)  s/p dual-chamber [employer] ICD presenting with sepsis likely due to RML pneumonia. Patient also with bradycardia in setting of sepsis, then developed shock, ARDS   Events Since Admission: 7/30 Admitted to ICU 7/31 Intubated and sedated/paralyzed. Severe hypotension requiring 2 pressors, dobutamine added  Current Status: Intubated, improving BP on lower doses requiring 2 pressors  Vital Signs: Temp:  [97.7 F (36.5 C)-99.5 F (37.5 C)] 98.7 F (37.1 C) (08/01 0755) Pulse Rate:  [71-107] 80  (08/01 0600) Resp:  [9-27] 26  (08/01 0600) BP: (69-154)/(41-86) 107/71 mmHg (08/01 0600) SpO2:  [86 %-100 %] 100 % (08/01 0600) Arterial Line BP: (49-144)/(33-89) 101/68 mmHg (08/01 0600) FiO2 (%):  [49.8 %-100 %] 60 % (08/01 0744) Weight:  [109.3 kg (240 lb 15.4 oz)] 109.3 kg (240 lb 15.4 oz) (08/01 0500)  Physical Examination: General: intubated, sedated Neuro:  Sedated, follows commands on wua HEENT:  mmm Neck:  Supple, no JVD Cardiovascular: Paced at 80bpm, distant heart sounds Lungs:  On ventilator, Coarse breath sounds bilaterally R >L with some crackles noted in right base,  Abdomen:  Soft, nontender, nondistended Musculoskeletal:  No edema, moves all extremities Skin:  Extremities slightly cool  Active Problems:  Atrial fibrillation  Right ventricular dysplasia  Sepsis  Community acquired pneumonia  Symptomatic bradycardia  Acute renal failure  Acute respiratory failure with hypoxia  Septic shock  ASSESSMENT AND PLAN  PULMONARY  Lab 10/29/11 0452 10/28/11 1810 10/28/11 1455 10/28/11 1137 10/28/11 0924 10/28/11 0626  PHART 7.413 7.353 -- 7.254* 7.205* 7.278*  PCO2ART 40.6 39.5  -- 45.2* 48.4* 34.3*  PO2ART 59.0* 56.0* -- 87.0 101.0* 62.0*  HCO3 25.9* 22.0 -- 19.7* 18.8* 16.0*  O2SAT 90.0 87.0 74.3 94.0 95.0 --   Ventilator Settings: N/A Vent Mode:  [-] PRVC FiO2 (%):  [49.8 %-100 %] 60 % Set Rate:  [26 bmp] 26 bmp Vt Set:  [490 mL] 490 mL PEEP:  [8 cmH20-10 cmH20] 8 cmH20 Plateau Pressure:  [17 cmH20-29 cmH20] 29 cmH20 CXR:  7/31. Worsening Pulm Edema, w/ R middle loab opacity consistent w/ Pneumonia. Right IJ catheter tip projects over the proximal SVC. Right subclavian catheter is unchanged, projecting over midline, indeterminate location.  ETT:  7/31  A:  Community acquired pneumonia. Worsening pulm edema. And Resp compensation for met acidosis. Hypoxia out of proportion to infiltrates ARDS  P:   Decreased rate on vent - improved autoPEEP,  decrease PEEP to 5 Rpt CXR does not suggest hemothroax Metab acidosis - add bicarb drip , increased rate back up to 26 Dc nimbex   CARDIOVASCULAR  Lab 10/28/11 1848 10/28/11 0841 10/28/11 0725 10/28/11 0325 10/28/11 0241 10/27/11 2354 10/27/11 2340 10/27/11 1742  TROPONINI <0.30 <0.30 -- -- <0.30 -- -- --  LATICACIDVEN -- -- 2.0 5.3* -- 4.9* -- 5.0*  PROBNP -- -- -- -- -- -- 8743.0* --   ECG:  Paced rhythm. No sign of ischemia Lines: NA  A: SIRS. Symptomatic bradycardia, now paced at 80BPM.  MAP's are consistently > 65.  AICD.   PAF. On Dopamine and Levo. Lactate normal so will DC further studies. CE elevation likely from demand ischemia. Pro BNP elevated.  P:  Cardiology consult -d/w dr Johney Frame  Hold home metoprolol and amiodarone Taper  Levophed to off  Off Dopamine  Goal MAP >65 Vascular consult for R. Subclavian arterial line in pt on pradaxa Neg Cardiac enzymes If coags normal on 8/1 then may pull R Subclavian -per dr Hart [place] co-ox was ok, on  low dose dobutamine (no titration - d/w dr Johney Frame )-high risk for arrythmias, will dc once off levo Dc [place] line today-last dose of pradaxa was on 7/31  [place]  RENAL  Lab 10/29/11 0450 10/28/11 1132 10/28/11 0325 10/27/11 1733  NA 141 141 139 139  K 3.7 4.1 -- --  CL 103 110 110 107  CO2 27 21 16* 19  BUN 23 27* 27* 26*  CREATININE 1.19 1.52* 1.55* 1.60*  CALCIUM 8.1* 7.5* 7.9* 7.6*  MG 2.1 -- 1.4* --  PHOS 1.7* -- -- --   Intake/Output      07/31 0701 - 08/01 0700 08/01 0701 - 08/02 0700   I.V. (mL/kg) 3558 (32.6)    IV Piggyback 350    Total Intake(mL/kg) 3908 (35.8)    Urine (mL/kg/hr) 2000 (0.8)    Total Output 2000    Net +1908          Foley:  NA  A:  AKI / Acute renal failure.  Hypovolemia. Hypomagnesemia P:   Repleted Mg Replete phos  GASTROINTESTINAL  Lab 10/28/11 0325 10/27/11 1733  AST 74* 29  ALT 57* 19  ALKPHOS 33* 34*  BILITOT 1.4* 1.5*  PROT 5.7* 5.3*  ALBUMIN 2.7* 2.6*    A:  Mildly elevated bilirubin, etiology is not clear. P:   Trend LFT  HEMATOLOGIC  Lab 10/29/11 0450 10/28/11 1132 10/28/11 0325 10/27/11 2354 10/27/11 1733  HGB 13.3 13.9 14.4 -- 13.6  HCT 39.7 40.9 42.4 -- 40.5  PLT 113* 131* 120* -- 122*  INR -- -- -- 1.84* --  APTT -- -- -- 55* --   A:  Thrombocytopenia. P:  Trend CBC Dc Pradaxa  INFECTIOUS  Lab 10/29/11 0450 10/28/11 1132 10/28/11 0325 10/27/11 1736 10/27/11 1733  WBC 12.4* 10.2 12.4* -- 7.7  PROCALCITON -- -- -- 17.08 --   Cultures: 7/30 Blood Cx >>>GPC clusters 1/2 Urine strep neg  legionella Ag neg  Antibiotics: Azithromycin 7/30 x 1 Ceftriaxone 7/30 >>>7/31 Levaquin 7/30>>> vanc 7/31 >>  A:  Community acquired pneumonia with sepsis.  P:   Antibiotics and cultures as above   ENDOCRINE  Lab 10/29/11 0738 10/29/11 0400 10/28/11 2331 10/28/11 2008 10/28/11 1519  GLUCAP 165* 163* 156* 155* 114*   A:  Concern adrenal insufficiency.    P:   Cortisol level -60 Dc Stress dose steroids  NEUROLOGIC  A:  No acute issues P:  Continuous fent - versed intermittent prn   BEST PRACTICE / DISPOSITION Level of Care:  ICU Primary Service:   PCCM Consultants:  Cardiology Code Status:  Full Diet:  Clear liquids DVT Px:  Not indicated (Pradaxa) GI Px:  Protonix Skin Integrity:  Intact Social / Family:  Updated daughter   10/29/2011, 8:42 AM  Care during the described time interval was provided by me and/or other providers on the critical care team.  I have reviewed this patient's available data, including medical history, events of note, physical examination and test results as part of my evaluation  CC time x  50 mins  Cyril Mourning MD. FCCP. [facility] Pager [phone] If no response call [phone]

## 2011-10-29 NOTE — Progress Notes (Signed)
eLink Physician-Brief Progress Note Patient Name: Robert Ochoa DOB: July 10, 1945 MRN: [id number]  Date of Service  10/29/2011   HPI/Events of Note   Mild agitation on only fentanyl gtt  eICU Interventions  Once time dose versed, will attempt to minimize   Intervention Category Major Interventions: Change in mental status - evaluation and management  Shermon Bozzi S. 10/29/2011, 10:03 PM

## 2011-10-29 NOTE — Progress Notes (Signed)
Clarified orders for nutrition with MD Zubelevitskiy. Will not start tube feedings tonight with plans of extubation in the morning. Doree Albee

## 2011-10-30 ENCOUNTER — Inpatient Hospital Stay (HOSPITAL_COMMUNITY): Payer: Medicare Other

## 2011-10-30 DIAGNOSIS — I4891 Unspecified atrial fibrillation: Secondary | ICD-10-CM

## 2011-10-30 LAB — BASIC METABOLIC PANEL
CO2: 33 mEq/L — ABNORMAL HIGH (ref 19–32)
Calcium: 8.3 mg/dL — ABNORMAL LOW (ref 8.4–10.5)
Creatinine, Ser: 1.21 mg/dL (ref 0.50–1.35)
GFR calc non Af Amer: 61 mL/min — ABNORMAL LOW (ref >90)
Glucose, Bld: 98 mg/dL (ref 70–99)

## 2011-10-30 LAB — POCT I-STAT 3, ART BLOOD GAS (G3+)
Bicarbonate: 26.4 mEq/L — ABNORMAL HIGH (ref 20.0–24.0)
O2 Saturation: 96 %
TCO2: 28 mmol/L (ref 0–100)
pCO2 arterial: 43.5 mmHg (ref 35.0–45.0)
pO2, Arterial: 83 mmHg (ref 80.0–100.0)

## 2011-10-30 LAB — GLUCOSE, CAPILLARY
Glucose-Capillary: 98 mg/dL (ref 70–99)
Glucose-Capillary: 91 mg/dL (ref 70–99)
Glucose-Capillary: 103 mg/dL — ABNORMAL HIGH (ref 70–99)

## 2011-10-30 LAB — CBC
HCT: 36.8 % — ABNORMAL LOW (ref 39.0–52.0)
Hemoglobin: 12.5 g/dL — ABNORMAL LOW (ref 13.0–17.0)
MCH: 28.4 pg (ref 26.0–34.0)
MCH: 29.3 pg (ref 26.0–34.0)
MCHC: 32.7 g/dL (ref 30.0–36.0)
MCHC: 34 g/dL (ref 30.0–36.0)
MCV: 86.9 fL (ref 78.0–100.0)
MCV: 86.2 fL (ref 78.0–100.0)
Platelets: 96 10*3/uL — ABNORMAL LOW (ref 150–400)
RDW: 14.4 % (ref 11.5–15.5)

## 2011-10-30 LAB — MAGNESIUM: Magnesium: 2 mg/dL (ref 1.5–2.5)

## 2011-10-30 MED ORDER — METOPROLOL TARTRATE 12.5 MG HALF TABLET
12.5000 mg | ORAL_TABLET | Freq: Two times a day (BID) | ORAL | Status: DC
  Administered 2011-10-30 – 2011-11-01 (×6): 12.5 mg via ORAL
  Filled 2011-10-30 (×8): qty 1

## 2011-10-30 MED ORDER — VANCOMYCIN HCL IN DEXTROSE 1-5 GM/200ML-% IV SOLN
1000.0000 mg | Freq: Two times a day (BID) | INTRAVENOUS | Status: DC
  Administered 2011-10-30 – 2011-10-31 (×2): 1000 mg via INTRAVENOUS
  Filled 2011-10-30 (×3): qty 200

## 2011-10-30 MED ORDER — AMIODARONE HCL 200 MG PO TABS
200.0000 mg | ORAL_TABLET | Freq: Every day | ORAL | Status: DC
  Administered 2011-10-30 – 2011-11-02 (×4): 200 mg via ORAL
  Filled 2011-10-30 (×4): qty 1

## 2011-10-30 MED ORDER — POTASSIUM CHLORIDE 10 MEQ/50ML IV SOLN
10.0000 meq | INTRAVENOUS | Status: AC
  Administered 2011-10-30 (×2): 10 meq via INTRAVENOUS
  Filled 2011-10-30 (×2): qty 50

## 2011-10-30 MED ORDER — RESOURCE THICKENUP CLEAR PO POWD
ORAL | Status: DC | PRN
  Filled 2011-10-30: qty 125

## 2011-10-30 MED ORDER — POTASSIUM & SODIUM PHOSPHATES 280-160-250 MG PO PACK
2.0000 | PACK | Freq: Three times a day (TID) | ORAL | Status: AC
  Administered 2011-10-30: 2 via ORAL
  Administered 2011-10-30: 1 via ORAL
  Filled 2011-10-30 (×2): qty 2

## 2011-10-30 MED ORDER — PANTOPRAZOLE SODIUM 40 MG IV SOLR
40.0000 mg | INTRAVENOUS | Status: DC
  Administered 2011-10-30: 40 mg via INTRAVENOUS
  Filled 2011-10-30: qty 40

## 2011-10-30 NOTE — Progress Notes (Signed)
Subjective: No CP  No SOB. Objective: Filed Vitals:   10/30/11 0410 10/30/11 0500 10/30/11 0600 10/30/11 0700  BP:  113/76 128/83 111/82  Pulse:  81 84 80  Temp: 98.6 F (37 C)     TempSrc: Axillary     Resp:  14 20 14   Height:      Weight:  240 lb 4.8 oz (109 kg)    SpO2:  99% 99% 99%   Weight change: -10.6 oz (-0.3 kg)  Intake/Output Summary (Last 24 hours) at 10/30/11 0835 Last data filed at 10/30/11 1610  Gross per 24 hour  Intake 1294.78 ml  Output   1105 ml  Net 189.78 ml    General: Alert, awake, oriented x3, in no acute distress Neck:  JVP is normal Heart: Regular rate and rhythm, without murmurs, rubs, gallops.  Lungs:  Coarse rhonchi Exemities:  No edema.   Neuro: Grossly intact, nonfocal.  Tele:  SR and afib  Rates 80s.  Lab Results: Results for orders placed during the hospital encounter of 10/27/11 (from the past 24 hour(s))  APTT     Status: Abnormal   Collection Time   10/29/11  9:30 AM      Component Value Range   aPTT 40 (*) 24 - 37 seconds  PROTIME-INR     Status: Abnormal   Collection Time   10/29/11  9:30 AM      Component Value Range   Prothrombin Time 18.6 (*) 11.6 - 15.2 seconds   INR 1.52 (*) 0.00 - 1.49  BLOOD GAS, ARTERIAL     Status: Abnormal   Collection Time   10/29/11 10:35 AM      Component Value Range   FIO2 0.50     Delivery systems VENTILATOR     Mode PRESSURE REGULATED VOLUME CONTROL     VT 490     Rate 26     Peep/cpap 5.0     pH, Arterial 7.462 (*) 7.350 - 7.450   pCO2 arterial 38.7  35.0 - 45.0 mmHg   pO2, Arterial 98.9  80.0 - 100.0 mmHg   Bicarbonate 27.3 (*) 20.0 - 24.0 mEq/L   TCO2 28.4  0 - 100 mmol/L   Acid-Base Excess 3.6 (*) 0.0 - 2.0 mmol/L   O2 Saturation 97.8     Patient temperature 98.6     Collection site A-LINE     Drawn by [id number]     Sample type ARTERIAL DRAW    GLUCOSE, CAPILLARY     Status: Abnormal   Collection Time   10/29/11 11:33 AM      Component Value Range   Glucose-Capillary 120 (*) 70 - 99  mg/dL  GLUCOSE, CAPILLARY     Status: Abnormal   Collection Time   10/29/11  4:32 PM      Component Value Range   Glucose-Capillary 137 (*) 70 - 99 mg/dL  GLUCOSE, CAPILLARY     Status: Normal   Collection Time   10/30/11 12:14 AM      Component Value Range   Glucose-Capillary 90  70 - 99 mg/dL   Comment 1 Documented in Chart     Comment 2 Notify RN    POCT I-STAT 3, BLOOD GAS (G3+)     Status: Abnormal   Collection Time   10/30/11 12:30 AM      Component Value Range   pH, Arterial 7.391  7.350 - 7.450   pCO2 arterial 43.5  35.0 - 45.0 mmHg   pO2,  Arterial 83.0  80.0 - 100.0 mmHg   Bicarbonate 26.4 (*) 20.0 - 24.0 mEq/L   TCO2 28  0 - 100 mmol/L   O2 Saturation 96.0     Acid-Base Excess 1.0  0.0 - 2.0 mmol/L   Patient temperature 98.2 F     Collection site ARTERIAL LINE     Drawn by Nurse     Sample type ARTERIAL    GLUCOSE, CAPILLARY     Status: Normal   Collection Time   10/30/11  4:12 AM      Component Value Range   Glucose-Capillary 98  70 - 99 mg/dL   Comment 1 Documented in Chart     Comment 2 Notify RN    BASIC METABOLIC PANEL     Status: Abnormal   Collection Time   10/30/11  4:41 AM      Component Value Range   Sodium 141  135 - 145 mEq/L   Potassium 3.4 (*) 3.5 - 5.1 mEq/L   Chloride 104  96 - 112 mEq/L   CO2 33 (*) 19 - 32 mEq/L   Glucose, Bld 98  70 - 99 mg/dL   BUN 24 (*) 6 - 23 mg/dL   Creatinine, Ser 1.61  0.50 - 1.35 mg/dL   Calcium 8.3 (*) 8.4 - 10.5 mg/dL   GFR calc non Af Amer 61 (*) >90 mL/min   GFR calc Af Amer 70 (*) >90 mL/min  CBC     Status: Abnormal   Collection Time   10/30/11  4:41 AM      Component Value Range   WBC 14.5 (*) 4.0 - 10.5 K/uL   RBC 4.29  4.22 - 5.81 MIL/uL   Hemoglobin 12.2 (*) 13.0 - 17.0 g/dL   HCT 09.6 (*) 04.5 - 40.9 %   MCV 86.9  78.0 - 100.0 fL   MCH 28.4  26.0 - 34.0 pg   MCHC 32.7  30.0 - 36.0 g/dL   RDW 81.1  91.4 - 78.2 %   Platelets 96 (*) 150 - 400 K/uL  MAGNESIUM     Status: Normal   Collection Time   10/30/11   4:41 AM      Component Value Range   Magnesium 2.0  1.5 - 2.5 mg/dL  PHOSPHORUS     Status: Abnormal   Collection Time   10/30/11  4:41 AM      Component Value Range   Phosphorus 2.1 (*) 2.3 - 4.6 mg/dL  PROCALCITONIN     Status: Normal   Collection Time   10/30/11  4:41 AM      Component Value Range   Procalcitonin 9.11      Studies/Results: Dg Chest Port 1 View  10/30/2011  *RADIOLOGY REPORT*  Clinical Data: Shortness of breath and cough.  PORTABLE CHEST - 1 VIEW  Comparison: 10/29/2011  Findings: Portable semi upright view of the chest was obtained. The right subclavian central line remains left of midline and in an indeterminate location.  The patient has a left cardiac ICD.  There are increased densities in both lungs concerning for worsening pulmonary edema and possible pleural effusions.  Heart size remains enlarged.  Endotracheal tube has been removed.  Right jugular central line is most likely in the upper SVC.  IMPRESSION: Increased densities in both lungs are concerning for decreased lung volumes and possibly increased pulmonary edema.  Bilateral pleural effusions.  Cardiomegaly.  Position of the right subclavian central line is unchanged and the location remains  indeterminate.  Original Report Authenticated By: Richarda Overlie, M.D.   Dg Chest Port 1 View  10/29/2011  *RADIOLOGY REPORT*  Clinical Data: Check ET tube position  PORTABLE CHEST - 1 VIEW  Comparison: 10/28/2011  Findings: Endotracheal tube terminates at the thoracic inlet, 6 cm above the carina.  Stable right subclavian line crossing the midline, likely within the subclavian artery.  Stable right IJ venous catheter and left subclavian ICD.  Cardiomegaly.  Pulmonary vascular congestion with possible mild interstitial edema.  Bilateral pleural effusions, left greater than right, improved.  Bilateral lower lobe atelectasis.  No pneumothorax is seen.  IMPRESSION: Endotracheal tube terminates at the thoracic inlet, 6 cm above the carina.   Stable support apparatus, including a suspected right subclavian arterial line.  Cardiomegaly with mild interstitial edema and bilateral pleural effusions, improved.  The suspected arterial line was called by telephone on 10/29/2011 at 0755 to the floor nurse caring for the patient, who confirmed that the line was arterial. Please note that the tip of the line projects centrally over the great vessels.  Original Report Authenticated By: Charline Bills, M.D.    Medications: Reviewed.   Patient Active Hospital Problem List: Atrial fibrillation (03/25/2009)   Assessment: Tele shows patient in/out afib and SR.  Rates controlled.  WIll resume amio today.  Anticoag still on hold as line has not been pulled.  Right ventricular dysplasia (09/22/2010)   Assessment: No vent arrhythmias for past 24hr.  Patient with ICD. Had been on metoprolol  75 bid as outpatient  Would  Add 12.5 bid and advance as tolerates.  Sepsis (10/27/2011)   Assessment: Resolving.  Rx per CCM  Community acquired pneumonia (10/27/2011)   Assessment: Rx per CCM  Symptomatic bradycardia (10/27/2011)   Assessment: Pacer rate readjusted to higher.  WIth all of changes the same for now.  Will readdress on Monday  If questions sooner can call EP or device rep.  Acute renal failure (10/27/2011)       LOS: 3 days   Dietrich Pates 10/30/2011, 8:35 AM

## 2011-10-30 NOTE — Evaluation (Signed)
Physical Therapy Evaluation Patient Details Name: Robert Ochoa MRN: [id number] DOB: [birth year] Today's Date: 10/30/2011 Time: 3086-5784 PT Time Calculation (min): 13 min  PT Assessment / Plan / Recommendation Clinical Impression  pt presents with Sepsis and PNA. pt very motivated to improve mobility.  pt wants to D/C to home and notes family can stop by daily, but not 24/7.  pt should make good progress.      PT Assessment  Patient needs continued PT services    Follow Up Recommendations  Home health PT;Supervision - Intermittent    Barriers to Discharge None      Equipment Recommendations  Rolling walker with 5" wheels    Recommendations for Other Services OT consult   Frequency Min 3X/week    Precautions / Restrictions Precautions Precautions: Fall Restrictions Weight Bearing Restrictions: No   Pertinent Vitals/Pain Denies pain.        Mobility  Bed Mobility Bed Mobility: Sit to Supine Sit to Supine: 5: Supervision Details for Bed Mobility Assistance: cues for positioning in bed and management of lines.   Transfers Transfers: Sit to Stand;Stand to [employer] Transfers Sit to Stand: 4: Min assist;With upper extremity assist;With armrests;From chair/3-in-1 Stand to Sit: 4: Min assist;With upper extremity assist;To bed Stand Pivot Transfers: 4: Min assist Details for Transfer Assistance: cues for UE use and movements through SPT.   Ambulation/Gait Ambulation/Gait Assistance: Not tested (comment) Stairs: No Wheelchair Mobility Wheelchair Mobility: No    Exercises     PT Diagnosis: Difficulty walking  PT Problem List: Decreased activity tolerance;Decreased balance;Decreased mobility;Decreased knowledge of use of DME;Cardiopulmonary status limiting activity PT Treatment Interventions: DME instruction;Gait training;Stair training;Functional mobility training;Therapeutic activities;Therapeutic exercise;Balance training;Patient/family education   PT Goals Acute  Rehab PT Goals PT Goal Formulation: With patient Time For Goal Achievement: 11/13/11 Potential to Achieve Goals: Good Pt will go Supine/Side to Sit: with modified independence PT Goal: Supine/Side to Sit - Progress: Goal set today Pt will go Sit to Supine/Side: with modified independence PT Goal: Sit to Supine/Side - Progress: Goal set today Pt will go Sit to Stand: with modified independence PT Goal: Sit to Stand - Progress: Goal set today Pt will go Stand to Sit: with modified independence PT Goal: Stand to Sit - Progress: Goal set today Pt will Ambulate: >150 feet;with modified independence;with least restrictive assistive device PT Goal: Ambulate - Progress: Goal set today Pt will Go Up / Down Stairs: 3-5 stairs;with supervision;with least restrictive assistive device PT Goal: Up/Down Stairs - Progress: Goal set today  Visit Information  Last PT Received On: 10/30/11 Assistance Needed: +1    Subjective Data  Subjective: How long till I'm back to normal?  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 Communication: No difficulties    Cognition  Overall Cognitive Status: Appears within functional limits for tasks assessed/performed Arousal/Alertness: Awake/alert Orientation Level: Oriented X4 / Intact Behavior During Session: [facility] for tasks performed    Extremity/Trunk Assessment Right Lower Extremity Assessment RLE ROM/Strength/Tone: WFL for tasks assessed RLE Sensation: WFL - Light Touch Left Lower Extremity Assessment LLE ROM/Strength/Tone: WFL for tasks assessed LLE Sensation:  [facility] - Light Touch Trunk Assessment Trunk Assessment: Normal  Balance Balance Balance Assessed: No  End of Session PT - End of Session Equipment Utilized During Treatment: Gait belt Activity Tolerance: Patient limited by fatigue Patient left: in bed;with call bell/phone within reach;with nursing in room Nurse Communication: Mobility status  GP     Sunny Schlein, [place] [phone] 10/30/2011, 1:06 PM

## 2011-10-30 NOTE — Progress Notes (Signed)
[facility] ADULT ICU REPLACEMENT PROTOCOL FOR AM LAB REPLACEMENT ONLY  The patient does apply for the [facility] Adult ICU Electrolyte Replacment Protocol based on the criteria listed below:   1. Is GFR >/= 50 ml/min? yes  Patient's GFR today is 61 2. Is urine output >/= 0.5 ml/kg/hr for the last 8 hours? yes Patient's UOP is .74 ml/kg/hr 3. Is BUN < 30 mg/dL? yes  Patient's BUN today is 24 4. Abnormal electrolyte(s): k 3.4 5. Ordered repletion with: potassium 6. If a panic level lab has been reported, has the CCM MD in charge been notified? yes.   Physician:  Dr Dannielle Burn, Clarnce Flock 10/30/2011 6:09 AM

## 2011-10-30 NOTE — Progress Notes (Signed)
Chaplain Note:  Chaplain visited with pt and pt's family.  Pt awake, reclining in bed.  Family was at bedside.  Chaplain provided spiritual comfort, support, and prayer for pt and family.  Pt and family expressed appreciation for chaplain support.  Chaplain will follow up as needed.  10/30/11 1400  Clinical Encounter Type  Visited With Patient and family together  Visit Type Spiritual support;Follow-up  Referral From Nurse  Spiritual Encounters  Spiritual Needs Emotional;Prayer  Stress Factors  Patient Stress Factors Health changes  Family Stress Factors Major life changes   Verdie Shire, chaplain resident [phone]

## 2011-10-30 NOTE — Progress Notes (Signed)
ANTIBIOTIC CONSULT NOTE - FOLLOW UP  Pharmacy Consult for Levaquin Indication: pneumonia  No Known Allergies  Patient Measurements: Height: 6\' 2"  (188 cm) Weight: 240 lb 4.8 oz (109 kg) IBW/kg (Calculated) : 82.2   Vital Signs: Temp: 97.8 F (36.6 C) (08/02 0844) Temp src: Oral (08/02 0844) BP: 151/73 mmHg (08/02 0900) Pulse Rate: 82  (08/02 0900) Intake/Output from previous day: 08/01 0701 - 08/02 0700 In: 1434.2 [I.V.:504.2; NG/GT:150; IV Piggyback:780] Out: 1185 [Urine:1185] Intake/Output from this shift: Total I/O In: 200 [IV Piggyback:200] Out: 125 [Urine:125]  Labs:  [facility] 10/30/11 0441 10/29/11 0450 10/28/11 1132  WBC 14.5* 12.4* 10.2  HGB 12.2* 13.3 13.9  PLT 96* 113* 131*  LABCREA -- -- --  CREATININE 1.21 1.19 1.52*   Estimated Creatinine Clearance: 78.9 ml/min (by C-G formula based on Cr of 1.21).  Basename 10/30/11 0730  VANCOTROUGH 6.3*  VANCOPEAK --  Drue Dun --  GENTTROUGH --  GENTPEAK --  GENTRANDOM --  TOBRATROUGH --  TOBRAPEAK --  TOBRARND --  AMIKACINPEAK --  AMIKACINTROU --  AMIKACIN --     Microbiology: Recent Results (from the past 720 hour(s))  CULTURE, BLOOD (ROUTINE X 2)     Status: Normal   Collection Time   10/27/11  5:56 PM      Component Value Range Status Comment   Specimen Description BLOOD FEMORAL ARTERY RIGHT   Final    Special Requests BOTTLES DRAWN AEROBIC ONLY 10CC   Final    Culture  Setup Time 10/28/2011 00:49   Final    Culture     Final    Value: STAPHYLOCOCCUS SPECIES (COAGULASE NEGATIVE)     Note: THE SIGNIFICANCE OF ISOLATING THIS ORGANISM FROM A SINGLE SET OF BLOOD CULTURES WHEN MULTIPLE SETS ARE DRAWN IS UNCERTAIN. PLEASE NOTIFY THE MICROBIOLOGY DEPARTMENT WITHIN ONE WEEK IF SPECIATION AND SENSITIVITIES ARE REQUIRED.     Note: Gram Stain Report Called to,Read Back By and Verified With: CHRIS @ 1803 ON 10/28/11 BY GOLLD   Report Status 10/30/2011 FINAL   Final   MRSA PCR SCREENING     Status: Normal   Collection Time   10/27/11 10:25 PM      Component Value Range Status Comment   MRSA by PCR NEGATIVE  NEGATIVE Final   CULTURE, BLOOD (ROUTINE X 2)     Status: Normal (Preliminary result)   Collection Time   10/27/11 11:50 PM      Component Value Range Status Comment   Specimen Description BLOOD LEFT ARM   Final    Special Requests BOTTLES DRAWN AEROBIC ONLY 10CC   Final    Culture  Setup Time 10/28/2011 03:52   Final    Culture     Final    Value:        BLOOD CULTURE RECEIVED NO GROWTH TO DATE CULTURE WILL BE HELD FOR 5 DAYS BEFORE ISSUING A FINAL NEGATIVE REPORT   Report Status PENDING   Incomplete     Anti-infectives     Start     Dose/Rate Route Frequency Ordered Stop   10/29/11 0800   levofloxacin (LEVAQUIN) IVPB 750 mg        750 mg 100 mL/hr over 90 Minutes Intravenous Every 24 hours 10/28/11 0751     10/29/11 0800   vancomycin (VANCOCIN) IVPB 1000 mg/200 mL premix  Status:  Discontinued        1,000 mg 200 mL/hr over 60 Minutes Intravenous Every 12 hours 10/28/11 1910 10/30/11 1040  10/28/11 2000   vancomycin (VANCOCIN) 1,750 mg in sodium chloride 0.9 % 500 mL IVPB        1,750 mg 250 mL/hr over 120 Minutes Intravenous  Once 10/28/11 1910 10/28/11 2211   10/28/11 1900   cefTRIAXone (ROCEPHIN) 1 g in dextrose 5 % 50 mL IVPB  Status:  Discontinued        1 g 100 mL/hr over 30 Minutes Intravenous Every 24 hours 10/28/11 0552 10/28/11 1202   10/28/11 1900   cefTRIAXone (ROCEPHIN) 2 g in dextrose 5 % 50 mL IVPB  Status:  Discontinued        2 g 100 mL/hr over 30 Minutes Intravenous Every 24 hours 10/27/11 2220 10/28/11 0750   10/28/11 0552   levofloxacin (LEVAQUIN) IVPB 750 mg  Status:  Discontinued        750 mg 100 mL/hr over 90 Minutes Intravenous Every 24 hours 10/28/11 0552 10/28/11 0751   10/27/11 2230   levofloxacin (LEVAQUIN) IVPB 750 mg  Status:  Discontinued        750 mg 100 mL/hr over 90 Minutes Intravenous Every 24 hours 10/27/11 2220 10/28/11 0751    10/27/11 1845   cefTRIAXone (ROCEPHIN) injection 1 g        1 g Intramuscular  Once 10/27/11 1834 10/27/11 1902   10/27/11 1845   azithromycin (ZITHROMAX) 500 mg in dextrose 5 % 250 mL IVPB        500 mg 250 mL/hr over 60 Minutes Intravenous  Once 10/27/11 1834 10/27/11 2006          Assessment: 66 y.o. M with history of ARVD s/p dual-chamber St. Jude ICD presenting with sepsis likely due to RML pneumonia seen on chest x-ray.  Patient was started on Azithromycin + Rocephin in ED but has since been transitioned to Levaquin alone.  CrCl remains ~58ml/min. Blood cultures came back with 1/2 positive for coag neg staph, predicted to likely be contaminate and vancomycin was discontinued. Repeat chest x-ray still has opacities and bilateral pleural effusions. WBC trending up and temperature remains slightly elevated. Procalcitonin levels 9.11, still elevated but trending down.   Goal of Therapy:  Eradication of infection.  Plan:  1. Continue Levaquin 750mg  IV every 24 hours 2. F/u chest x-ray scheduled for 8/3 3. Will continue to follow renal function, CBC and temp  Micheline Chapman 10/30/2011,12:55 PM   Agree with above assessment and plan.   Link Snuffer, PharmD, BCPS Clinical Pharmacist [phone] 10/30/2011, 1:23 PM

## 2011-10-30 NOTE — Progress Notes (Signed)
ANTIBIOTIC CONSULT NOTE - FOLLOW UP  Pharmacy Consult to restart Vancomycin Indication: bacteremia in patient with ICD placement  No Known Allergies  Patient Measurements: Height: 6\' 2"  (188 cm) Weight: 240 lb 4.8 oz (109 kg) IBW/kg (Calculated) : 82.2   Vital Signs: Temp: 99.1 F (37.3 C) (08/02 1600) Temp src: Oral (08/02 1600) BP: 126/84 mmHg (08/02 1600) Pulse Rate: 89  (08/02 1600) Intake/Output from previous day: 08/01 0701 - 08/02 0700 In: 1434.2 [I.V.:504.2; NG/GT:150; IV Piggyback:780] Out: 1185 [Urine:1185] Intake/Output from this shift: Total I/O In: 200 [IV Piggyback:200] Out: 400 [Urine:400]  Labs:  [facility] 10/30/11 0441 10/29/11 0450 10/28/11 1132  WBC 14.5* 12.4* 10.2  HGB 12.2* 13.3 13.9  PLT 96* 113* 131*  LABCREA -- -- --  CREATININE 1.21 1.19 1.52*   Estimated Creatinine Clearance: 78.9 ml/min (by C-G formula based on Cr of 1.21).  Basename 10/30/11 0730  VANCOTROUGH 6.3*  VANCOPEAK --  Drue Dun --  GENTTROUGH --  GENTPEAK --  GENTRANDOM --  TOBRATROUGH --  TOBRAPEAK --  TOBRARND --  AMIKACINPEAK --  AMIKACINTROU --  AMIKACIN --     Microbiology: Recent Results (from the past 720 hour(s))  CULTURE, BLOOD (ROUTINE X 2)     Status: Normal   Collection Time   10/27/11  5:56 PM      Component Value Range Status Comment   Specimen Description BLOOD FEMORAL ARTERY RIGHT   Final    Special Requests BOTTLES DRAWN AEROBIC ONLY 10CC   Final    Culture  Setup Time 10/28/2011 00:49   Final    Culture     Final    Value: STAPHYLOCOCCUS SPECIES (COAGULASE NEGATIVE)     Note: THE SIGNIFICANCE OF ISOLATING THIS ORGANISM FROM A SINGLE SET OF BLOOD CULTURES WHEN MULTIPLE SETS ARE DRAWN IS UNCERTAIN. PLEASE NOTIFY THE MICROBIOLOGY DEPARTMENT WITHIN ONE WEEK IF SPECIATION AND SENSITIVITIES ARE REQUIRED.     Note: Gram Stain Report Called to,Read Back By and Verified With: CHRIS @ 1803 ON 10/28/11 BY GOLLD   Report Status 10/30/2011 FINAL   Final     MRSA PCR SCREENING     Status: Normal   Collection Time   10/27/11 10:25 PM      Component Value Range Status Comment   MRSA by PCR NEGATIVE  NEGATIVE Final   CULTURE, BLOOD (ROUTINE X 2)     Status: Normal (Preliminary result)   Collection Time   10/27/11 11:50 PM      Component Value Range Status Comment   Specimen Description BLOOD LEFT ARM   Final    Special Requests BOTTLES DRAWN AEROBIC ONLY 10CC   Final    Culture  Setup Time 10/28/2011 03:52   Final    Culture     Final    Value:        BLOOD CULTURE RECEIVED NO GROWTH TO DATE CULTURE WILL BE HELD FOR 5 DAYS BEFORE ISSUING A FINAL NEGATIVE REPORT   Report Status PENDING   Incomplete     Anti-infectives     Start     Dose/Rate Route Frequency Ordered Stop   10/30/11 1800   vancomycin (VANCOCIN) IVPB 1000 mg/200 mL premix        1,000 mg 200 mL/hr over 60 Minutes Intravenous Every 12 hours 10/30/11 1704     10/29/11 0800   levofloxacin (LEVAQUIN) IVPB 750 mg        750 mg 100 mL/hr over 90 Minutes Intravenous Every 24 hours 10/28/11 0751  10/29/11 0800   vancomycin (VANCOCIN) IVPB 1000 mg/200 mL premix  Status:  Discontinued        1,000 mg 200 mL/hr over 60 Minutes Intravenous Every 12 hours 10/28/11 1910 10/30/11 1040   10/28/11 2000   vancomycin (VANCOCIN) 1,750 mg in sodium chloride 0.9 % 500 mL IVPB        1,750 mg 250 mL/hr over 120 Minutes Intravenous  Once 10/28/11 1910 10/28/11 2211   10/28/11 1900   cefTRIAXone (ROCEPHIN) 1 g in dextrose 5 % 50 mL IVPB  Status:  Discontinued        1 g 100 mL/hr over 30 Minutes Intravenous Every 24 hours 10/28/11 0552 10/28/11 1202   10/28/11 1900   cefTRIAXone (ROCEPHIN) 2 g in dextrose 5 % 50 mL IVPB  Status:  Discontinued        2 g 100 mL/hr over 30 Minutes Intravenous Every 24 hours 10/27/11 2220 10/28/11 0750   10/28/11 0552   levofloxacin (LEVAQUIN) IVPB 750 mg  Status:  Discontinued        750 mg 100 mL/hr over 90 Minutes Intravenous Every 24 hours 10/28/11  0552 10/28/11 0751   10/27/11 2230   levofloxacin (LEVAQUIN) IVPB 750 mg  Status:  Discontinued        750 mg 100 mL/hr over 90 Minutes Intravenous Every 24 hours 10/27/11 2220 10/28/11 0751   10/27/11 1845   cefTRIAXone (ROCEPHIN) injection 1 g        1 g Intramuscular  Once 10/27/11 1834 10/27/11 1902   10/27/11 1845   azithromycin (ZITHROMAX) 500 mg in dextrose 5 % 250 mL IVPB        500 mg 250 mL/hr over 60 Minutes Intravenous  Once 10/27/11 1834 10/27/11 2006          Assessment: 66 y.o. M with history of ARVD s/p dual-chamber [employer]. Jude ICD presenting with sepsis likely due to RML pneumonia seen on chest x-ray.  Patient was started on Azithromycin + Rocephin in ED but has since been transitioned to Levaquin alone, last vancomycin dose on 8/1. CrCl remains ~24ml/min. Blood cultures came back with 1/2 positive for coag neg staph, possibly a contaminant and vancomycin was discontinued this am.  Since then patient doing worse clinically so vancomycin to be restarted.   Goal of Therapy:  Eradication of infection.  Plan:  1. Restart Vancomycin 1gm IV q12hrs.  2. F/u cultures, chest xray 3. Will continue to follow renal function, CBC and temp  Wendie Simmer, PharmD, BCPS Clinical Pharmacist  Pager: [phone]

## 2011-10-30 NOTE — Progress Notes (Signed)
COags ok RT [place] [facility] was dc'd at 12 30 pm. Pt tolerated procedure well with minimal bleeding. Pressure maintained x 10 mins CXR in am  ALVA,RAKESH V.

## 2011-10-30 NOTE — Progress Notes (Signed)
Name: Robert Ochoa MRN: [id number] DOB: [birth year]    LOS: 3  Referring Provider:  EDP Reason for Referral:  Suspected sepsis Cards - [facility]  PULMONARY / CRITICAL CARE MEDICINE  Brief patient description:  66 year old male with a history of ARVD (Arrhythmogenic right ventricular dysplasia)  s/p dual-chamber St. Jude ICD presenting with sepsis likely due to RML pneumonia. Patient also with bradycardia in setting of sepsis, then developed shock, ARDS   Events Since Admission: 7/30 Admitted to ICU 7/31 Intubated and sedated/paralyzed. Severe hypotension requiring 2 pressors, dobutamine added 8/1 self extubn, off pressors  Current Status: self extubn, off pressors, on venti mask, oob to cahir, denies CP, dyspnea  Vital Signs: Temp:  [97.8 F (36.6 C)-99.3 F (37.4 C)] 97.8 F (36.6 C) (08/02 0844) Pulse Rate:  [79-112] 82  (08/02 0900) Resp:  [13-28] 23  (08/02 0900) BP: (87-151)/(62-88) 151/73 mmHg (08/02 0900) SpO2:  [87 %-100 %] 100 % (08/02 0900) Arterial Line BP: (84-152)/(66-88) 152/74 mmHg (08/02 0900) FiO2 (%):  [49.7 %-100 %] 100 % (08/02 0900) Weight:  [109 kg (240 lb 4.8 oz)] 109 kg (240 lb 4.8 oz) (08/02 0500)  Physical Examination: General: anxious,  Neuro:  Non focal, speech ok HEENT:  mmm Neck:  Supple, no JVD Cardiovascular: Paced at 80bpm, distant heart sounds Lungs:  coarse breath sounds bilaterally R >L with some crackles noted in right base,  Abdomen:  Soft, nontender, nondistended Musculoskeletal:  No edema, moves all extremities Skin:  Extremities slightly cool  Active Problems:  Atrial fibrillation  Right ventricular dysplasia  Sepsis  Community acquired pneumonia  Symptomatic bradycardia  Acute renal failure  Acute respiratory failure with hypoxia  Septic shock  ASSESSMENT AND PLAN  PULMONARY  Lab 10/30/11 0030 10/29/11 1035 10/29/11 0452 10/28/11 1810 10/28/11 1455 10/28/11 1137  PHART 7.391 7.462* 7.413 7.353 -- 7.254*  PCO2ART 43.5  38.7 40.6 39.5 -- 45.2*  PO2ART 83.0 98.9 59.0* 56.0* -- 87.0  HCO3 26.4* 27.3* 25.9* 22.0 -- 19.7*  O2SAT 96.0 97.8 90.0 87.0 74.3 --   Ventilator Settings: N/A Vent Mode:  [-] PRVC FiO2 (%):  [49.7 %-100 %] 100 % Set Rate:  [26 bmp] 26 bmp Vt Set:  [490 mL] 490 mL PEEP:  [5 cmH20] 5 cmH20 Plateau Pressure:  [11 cmH20-16 cmH20] 11 cmH20 CXR:  8/2 Bl effusions  ETT:  7/31  A:  Community acquired pneumonia. Worsening pulm edema. And Resp compensation for met acidosis. Hypoxia out of proportion to infiltrates ARDS  P:   Rpt CXR does not suggest hemothroax Advance PO   CARDIOVASCULAR  Lab 10/28/11 1848 10/28/11 0841 10/28/11 0725 10/28/11 0325 10/28/11 0241 10/27/11 2354 10/27/11 2340 10/27/11 1742  TROPONINI <0.30 <0.30 -- -- <0.30 -- -- --  LATICACIDVEN -- -- 2.0 5.3* -- 4.9* -- 5.0*  PROBNP -- -- -- -- -- -- 8743.0* --   ECG:  Paced rhythm. No sign of ischemia Lines: NA  A: SIRS. Symptomatic bradycardia, now paced at 80BPM.  MAP's are consistently > 65.  AICD.   PAF. On Dopamine and Levo. Lactate normal so will DC further studies. CE elevation likely from demand ischemia. Pro BNP elevated.  P:  Cardiology consult -d/w dr Johney Frame Resume low dose metoprolol and amiodarone Neg Cardiac enzymes Dc Sarah Ann line today-last dose of pradaxa was on 7/31 [place], coags nml Can resume pradaxa in 24 h if uneventful  RENAL  Lab 10/30/11 0441 10/29/11 0450 10/28/11 1132 10/28/11 0325 10/27/11 1733  NA 141 141  141 139 139  K 3.4* 3.7 -- -- --  CL 104 103 110 110 107  CO2 33* 27 21 16* 19  BUN 24* 23 27* 27* 26*  CREATININE 1.21 1.19 1.52* 1.55* 1.60*  CALCIUM 8.3* 8.1* 7.5* 7.9* 7.6*  MG 2.0 2.1 -- 1.4* --  PHOS 2.1* 1.7* -- -- --   Intake/Output      08/01 0701 - 08/02 0700 08/02 0701 - 08/03 0700   I.V. (mL/kg) 504.2 (4.6)    NG/GT 150    IV Piggyback 780 200   Total Intake(mL/kg) 1434.2 (13.2) 200 (1.8)   Urine (mL/kg/hr) 1185 (0.5) 125   Total Output 1185 125   Net +249.2 +75           Foley:  NA  A:  AKI / Acute renal failure.  Hypovolemia. Hypomagnesemia P:   Repleted Mg Repleted phos  GASTROINTESTINAL  Lab 10/28/11 0325 10/27/11 1733  AST 74* 29  ALT 57* 19  ALKPHOS 33* 34*  BILITOT 1.4* 1.5*  PROT 5.7* 5.3*  ALBUMIN 2.7* 2.6*    A:  Mildly elevated bilirubin, etiology is not clear. P:   Trend LFT  HEMATOLOGIC  Lab 10/30/11 0850 10/30/11 0441 10/29/11 0930 10/29/11 0450 10/28/11 1132 10/28/11 0325 10/27/11 2354 10/27/11 1733  HGB -- 12.2* -- 13.3 13.9 14.4 -- 13.6  HCT -- 37.3* -- 39.7 40.9 42.4 -- 40.5  PLT -- 96* -- 113* 131* 120* -- 122*  INR 1.18 -- 1.52* -- -- -- 1.84* --  APTT 24 -- 40* -- -- -- 55* --   A:  Thrombocytopenia. P:  Trend CBC Off Pradaxa  INFECTIOUS  Lab 10/30/11 0441 10/29/11 0450 10/28/11 1132 10/28/11 0325 10/27/11 1736 10/27/11 1733  WBC 14.5* 12.4* 10.2 12.4* -- 7.7  PROCALCITON 9.11 -- -- -- 17.08 --   Cultures: 7/30 Blood Cx >>>coag neg staph 1/2 Urine strep neg  legionella Ag neg  Antibiotics: Azithromycin 7/30 x 1 Ceftriaxone 7/30 >>>7/31 Levaquin 7/30>>> vanc 7/31 >>8/2  A:  Community acquired pneumonia with sepsis.  P:   Antibiotics and cultures as above   ENDOCRINE  Lab 10/30/11 0412 10/30/11 0014 10/29/11 1632 10/29/11 1133 10/29/11 0738  GLUCAP 98 90 137* 120* 165*   A:  Concern adrenal insufficiency.    P:   Cortisol level -60 Dc Stress dose steroids  NEUROLOGIC  A:  No acute issues P:  PT    BEST PRACTICE / DISPOSITION Level of Care:  ICU Primary Service:  PCCM Consultants:  Cardiology Code Status:  Full Diet:  Clear liquids DVT Px:  Not indicated (Pradaxa) GI Px:  Protonix Skin Integrity:  Intact Social / Family:  Updated daughter   10/30/2011, 10:41 AM  Care during the described time interval was provided by me and/or other providers on the critical care team.  I have reviewed this patient's available data, including medical history, events of note, physical  examination and test results as part of my evaluation  CC time x  35 mins  Cyril Mourning MD. FCCP. [facility] Pager [phone] If no response call 319 [phone]

## 2011-10-30 NOTE — Evaluation (Signed)
Clinical/Bedside Swallow Evaluation Patient Details  Name: Robert Ochoa MRN: [id number] Date of Birth: [birth year]  Today's Date: 10/30/2011 Time: 1530-1550 SLP Time Calculation (min): 20 min  Past Medical History:  Past Medical History  Diagnosis Date  . Ventricular tachycardia     ICD discharges 05/2011 for this & AF-RVR - started on amiodarone then.  . Arrhythmogenic right ventricular dysplasia     Hx of VT s/p dual-chamber St. Jude ICD. Cardiac MRI 2010 showed moderately dilated RV with hypokinesis, LV EF 47%. EF 50-55% 2010 but down to 45% 05/2011. Cath 01/2009 without significant CAD.  [name] Atrial fibrillation or flutter     Paroxysmal. Started on Pradaxa 05/2011.  . Cardiac defibrillator in place   . Anxiety    Past Surgical History:  Past Surgical History  Procedure Date  . Appendectomy   . Cardiac defibrillator placement 02/19/09     St,. Jude    HPI:  66 year old male with a history of ARVD (Arrhythmogenic right ventricular dysplasia) s/p dual-chamber St. Jude ICD presenting with sepsis likely due to RML pneumonia. Patient also with bradycardia in setting of sepsis, then developed shock, ARDS. Intubated 7/31, self extubated 8/1.   Assessment / Plan / Recommendation Clinical Impression  Pt presents with signs of aspiration with thin liquids (cough) consistent with decreased airway protection after intubation and self extubation. Recommend pt initiate a dys 3 (mech soft) diet with nectar thick liquids until signs of aspiration improve at bedside, likely in 1-2 days. Suspect acute reversible dysphagia.     Aspiration Risk  Mild    Diet Recommendation Dysphagia 3 (Mechanical Soft);Nectar-thick liquid   Liquid Administration via: Cup;No straw Medication Administration: Whole meds with puree Supervision: Patient able to self feed Compensations: Slow rate;Small sips/bites Postural Changes and/or Swallow Maneuvers: Seated upright 90 degrees;Upright 30-60 min after meal    Other   Recommendations Oral Care Recommendations: Oral care BID Other Recommendations: Order thickener from pharmacy   Follow Up Recommendations       Frequency and Duration min 2x/week  2 weeks   Pertinent Vitals/Pain NA    SLP Swallow Goals Patient will consume recommended diet without observed clinical signs of aspiration with: Independent assistance Patient will utilize recommended strategies during swallow to increase swallowing safety with: Independent assistance Goal #3: Pt will consume trials of thin liquids at bedside with SLP only without clinical signs of aspiration.    Swallow Study Prior Functional Status       General HPI: 66 year old male with a history of ARVD (Arrhythmogenic right ventricular dysplasia) s/p dual-chamber St. Jude ICD presenting with sepsis likely due to RML pneumonia. Patient also with bradycardia in setting of sepsis, then developed shock, ARDS. Intubated 7/31, self extubated 8/1. Type of Study: Bedside swallow evaluation Diet Prior to this Study: NPO Temperature Spikes Noted: No Respiratory Status: Supplemental O2 delivered via (comment) History of Recent Intubation: Yes Length of Intubations (days): 2 days Date extubated: 10/29/11 Behavior/Cognition: Alert;Cooperative;Pleasant mood Oral Cavity - Dentition: Adequate natural dentition Self-Feeding Abilities: Able to feed self Patient Positioning: Upright in bed Baseline Vocal Quality: Hoarse Volitional Cough: Strong Volitional Swallow: Able to elicit    Oral/Motor/Sensory Function Overall Oral Motor/Sensory Function: Appears within functional limits for tasks assessed   Ice Chips     Thin Liquid Thin Liquid: Impaired Presentation: Cup;Self Fed;Straw Pharyngeal  Phase Impairments: Cough - Immediate    Nectar Thick Nectar Thick Liquid: Within functional limits   Honey Thick     Puree Puree:  Within functional limits   Solid Solid: Within functional limits   [facility], MA CCC-SLP  [phone]  Claudine Mouton 10/30/2011,4:03 PM

## 2011-10-30 NOTE — Progress Notes (Signed)
Bacteremia with staph coag neg in a patient with ICD placement.   Plan: repeat BC and start vancomycin pending sensitivities; could be a contaminant, would treat until further information available.

## 2011-10-30 NOTE — Progress Notes (Addendum)
Patient self extubated. Patient received 4mg  versed prior for a RASS of 3. Patient had green safety mittens on.  Patient was placed on nonrebreather after extubation. MD Zubelevitskiy assessed at bedside. Will closely monitor patient. Doree Albee

## 2011-10-30 NOTE — Progress Notes (Signed)
Called by the nurse for asymmetry in neck soft tissue. The patient self extubated last night. Subclavian central line placed in the R artery, now removed, patient on previous antiplatelet therapy.   We will obtain CT neck and chest to evaluate for bleeding/pneumothorax/pneomomediastinum contributing to his symptoms.

## 2011-10-31 ENCOUNTER — Inpatient Hospital Stay (HOSPITAL_COMMUNITY): Payer: Medicare Other

## 2011-10-31 LAB — GLUCOSE, CAPILLARY
Glucose-Capillary: 102 mg/dL — ABNORMAL HIGH (ref 70–99)
Glucose-Capillary: 105 mg/dL — ABNORMAL HIGH (ref 70–99)
Glucose-Capillary: 109 mg/dL — ABNORMAL HIGH (ref 70–99)

## 2011-10-31 LAB — COMPREHENSIVE METABOLIC PANEL
ALT: 42 U/L (ref 0–53)
AST: 28 U/L (ref 0–37)
Albumin: 2.1 g/dL — ABNORMAL LOW (ref 3.5–5.2)
Alkaline Phosphatase: 70 U/L (ref 39–117)
Calcium: 8 mg/dL — ABNORMAL LOW (ref 8.4–10.5)
Potassium: 3.3 mEq/L — ABNORMAL LOW (ref 3.5–5.1)
Sodium: 140 mEq/L (ref 135–145)
Total Protein: 5 g/dL — ABNORMAL LOW (ref 6.0–8.3)

## 2011-10-31 LAB — CBC
MCH: 29.2 pg (ref 26.0–34.0)
MCHC: 33.5 g/dL (ref 30.0–36.0)
Platelets: 108 10*3/uL — ABNORMAL LOW (ref 150–400)

## 2011-10-31 MED ORDER — BIOTENE DRY MOUTH MT LIQD
15.0000 mL | Freq: Two times a day (BID) | OROMUCOSAL | Status: DC
  Administered 2011-10-31: 15 mL via OROMUCOSAL

## 2011-10-31 MED ORDER — POTASSIUM CHLORIDE 20 MEQ/15ML (10%) PO LIQD
40.0000 meq | ORAL | Status: AC
  Administered 2011-10-31: 40 meq
  Filled 2011-10-31 (×2): qty 30

## 2011-10-31 MED ORDER — LORAZEPAM 0.5 MG PO TABS
1.0000 mg | ORAL_TABLET | Freq: Once | ORAL | Status: AC
  Administered 2011-10-31: 1 mg via ORAL
  Filled 2011-10-31: qty 2

## 2011-10-31 NOTE — Progress Notes (Signed)
Name: Robert Ochoa MRN: [id number] DOB: Feb 21, 1946    LOS: 4  Referring Provider:  EDP Reason for Referral:  Suspected sepsis Cards - [facility]  PULMONARY / CRITICAL CARE MEDICINE  Brief patient description:  66 year old male with a history of ARVD (Arrhythmogenic right ventricular dysplasia)  s/p dual-chamber [employer] ICD presenting with sepsis likely due to RML pneumonia. Patient also with bradycardia in setting of sepsis, then developed shock, ARDS   Events Since Admission: 7/30 Admitted to ICU 7/31 Intubated and sedated/paralyzed. Severe hypotension requiring 2 pressors, dobutamine added 8/1  self extubn, off pressors  Current Status: Sitting it the chair, in no distress, on ambient air.  Vital Signs: Temp:  [97.5 F (36.4 C)-99.1 F (37.3 C)] 98.6 F (37 C) (08/03 0811) Pulse Rate:  [61-100] 82  (08/03 0800) Resp:  [18-32] 25  (08/03 0800) BP: (118-143)/(75-99) 125/78 mmHg (08/03 0800) SpO2:  [93 %-100 %] 95 % (08/03 0800) FiO2 (%):  [35 %] 35 % (08/02 1200) Weight:  [109 kg (240 lb 4.8 oz)] 109 kg (240 lb 4.8 oz) (08/03 0500)  Physical Examination: General: Comfortable, no distress Neuro:  Awake, alert cooperative HEENT:  PERRL Neck:  Supple, no JVD Cardiovascular: Paced rhythm, no murmurs Lungs:  Scattered rhonchi bilaterally Abdomen:  Soft, nontender, nondistended Musculoskeletal:  No edema, moves all extremities Skin:  No rash  Active Problems:  Atrial fibrillation  Right ventricular dysplasia  Sepsis  Community acquired pneumonia  Symptomatic bradycardia  Acute renal failure  Acute respiratory failure with hypoxia  Septic shock  ASSESSMENT AND PLAN  PULMONARY  Lab 10/30/11 0030 10/29/11 1035 10/29/11 0452 10/28/11 1810 10/28/11 1455 10/28/11 1137  PHART 7.391 7.462* 7.413 7.353 -- 7.254*  PCO2ART 43.5 38.7 40.6 39.5 -- 45.2*  PO2ART 83.0 98.9 59.0* 56.0* -- 87.0  HCO3 26.4* 27.3* 25.9* 22.0 -- 19.7*  O2SAT 96.0 97.8 90.0 87.0 74.3 --    Ventilator Settings: N/A Vent Mode:  [-]  FiO2 (%):  [35 %] 35 %  CXR:  8/3 >>> Small effusions, stable bilateral lower lobe airspace disease  ETT:  7/31 >>> 8/2  A:  Community acquired pneumonia.  ARDS vs acute pulmonary edema, resolved, now on ambient air. P:   Supplemental oxygen PRN Bronchodilators PRN  CARDIOVASCULAR  Lab 10/28/11 1848 10/28/11 0841 10/28/11 0725 10/28/11 0325 10/28/11 0241 10/27/11 2354 10/27/11 2340 10/27/11 1742  TROPONINI <0.30 <0.30 -- -- <0.30 -- -- --  LATICACIDVEN -- -- 2.0 5.3* -- 4.9* -- 5.0*  PROBNP -- -- -- -- -- -- 8743.0* --   ECG:  Paced rhythm. No sign of ischemia Lines: NA  A: SIRS, resolved. Symptomatic bradycardia, now paced at 80 with improved hemodynamics.   P:  Cardiology following Metoprolol and amiodarone Would hold Pradaxa for now given neck hematoma PIV, d/c CVL  RENAL  Lab 10/31/11 0446 10/30/11 0441 10/29/11 0450 10/28/11 1132 10/28/11 0325  NA 140 141 141 141 139  K 3.3* 3.4* -- -- --  CL 104 104 103 110 110  CO2 30 33* 27 21 16*  BUN 23 24* 23 27* 27*  CREATININE 1.12 1.21 1.19 1.52* 1.55*  CALCIUM 8.0* 8.3* 8.1* 7.5* 7.9*  MG -- 2.0 2.1 -- 1.4*  PHOS -- 2.1* 1.7* -- --   Intake/Output      08/02 0701 - 08/03 0700 08/03 0701 - 08/04 0700   P.O. 120    I.V. (mL/kg)     NG/GT     IV Piggyback 600  Total Intake(mL/kg) 720 (6.6)    Urine (mL/kg/hr) 1400 (0.5)    Total Output 1400    Net -680          Foley:  NA  A:  AKI / Acute renal failure, resolved.  Hypokalemia. P:   Replete K Trend BMP  GASTROINTESTINAL  Lab 10/31/11 0446 10/28/11 0325 10/27/11 1733  AST 28 74* 29  ALT 42 57* 19  ALKPHOS 70 33* 34*  BILITOT 0.9 1.4* 1.5*  PROT 5.0* 5.7* 5.3*  ALBUMIN 2.1* 2.7* 2.6*   A:  Mildly elevated bilirubin, resolved.  Self extubated.  Dysphagia complaints prior to admission. P:   Dysphagia 3 diet SLP to reevaluate  HEMATOLOGIC  Lab 10/31/11 0446 10/30/11 1935 10/30/11 0850 10/30/11 0441  10/29/11 0930 10/29/11 0450 10/28/11 1132 10/27/11 2354  HGB 11.9* 12.5* -- 12.2* -- 13.3 13.9 --  HCT 35.5* 36.8* -- 37.3* -- 39.7 40.9 --  PLT 108* 98* -- 96* -- 113* 131* --  INR -- -- 1.18 -- 1.52* -- -- 1.84*  APTT -- -- 24 -- 40* -- -- 55*   A:  Thrombocytopenia, improving, likely sepsis related. P:  Trend CBC Off Pradaxa for now  INFECTIOUS  Lab 10/31/11 0446 10/30/11 1935 10/30/11 0441 10/29/11 0450 10/28/11 1132 10/27/11 1736  WBC 8.2 10.9* 14.5* 12.4* 10.2 --  PROCALCITON -- -- 9.11 -- -- 17.08   Cultures: 7/30 Blood >>>coag neg staph 1/2 8/2   Blood >>> ntd Urine strep neg  Legionella Ag neg  Antibiotics: Azithromycin 7/30 x 1 Ceftriaxone 7/30 >>>7/31 Levaquin 7/30 >>> Vanc 7/31 >>> 8/3  A:  Community acquired pneumonia with sepsis, improving.  1/2 blood culture on 7/30 coag neg Staph - likely contaminant. P:   Antibiotics and cultures as above D/c Vancomycin  ENDOCRINE  Lab 10/31/11 0734 10/31/11 0403 10/30/11 2359 10/30/11 1953 10/30/11 1554  GLUCAP 105* 109* 102* 115* 103*   Cortisol: 60  A:  No evidence of adrenal insufficiency.  No history of DM, off steroids, normoglycemic. P:   D/c SSI  NEUROLOGIC  A:  No acute issues. P:   PT Lexapro as preadmission  BEST PRACTICE / DISPOSITION Level of Care:  Transfer to telemetry Primary Service:  Transfer care to [facility], PCCM will sign off Consultants:  Cardiology Code Status:  Full Diet:  Dysphagia 3 DVT Px:  SCD GI Px:  Not indicated Skin Integrity:  Intact Social / Family:  Updated significant other  Lonia Farber, M.D. Pulmonary and Critical Care Medicine [facility] Pager: [phone]  10/31/2011, 11:37 AM

## 2011-10-31 NOTE — Progress Notes (Signed)
Speech Language Pathology Dysphagia Treatment Patient Details Name: Robert Ochoa MRN: [id number] DOB: Mar 16, 1946 Today's Date: 10/31/2011 Time: 1500-1530 SLP Time Calculation (min): 30 min  Assessment / Plan / Recommendation Clinical Impression  Purpose of treatment for possible diet upgrade to regular consistency and thin liquids from dysphagia 3 diet consistency and nectar thick liquids.  Patient states that he refuses to drink the "thick" liquid as it makes him "sick".  Patient observed with thin liquid by cup/straw and regular conistency.  One instance of coughing s/p swallow of thin by straw.  Recommend to upgrade diet to regular consistency and thin liquids .  Recommend to continue aspiration precautions with intermittent supervision with meals.  ST to follow x1 for diet tolerance and d/c .     Diet Recommendation  Initiate / Change Diet: Regular;Thin liquid    SLP Plan Continue with current plan of care      Swallowing Goals  SLP Swallowing Goals Swallow Study Goal #3 - Progress: Progressing toward goal  General Temperature Spikes Noted: No Respiratory Status: Supplemental O2 delivered via (comment) Behavior/Cognition: Alert;Cooperative;Pleasant mood Oral Cavity - Dentition: Adequate natural dentition Patient Positioning: Upright in chair  Oral Cavity - Oral Hygiene Brush patient's teeth BID with toothbrush (using toothpaste with fluoride): Yes   Dysphagia Treatment Treatment focused on: Upgraded PO texture trials;Facilitation of pharyngeal phase;Patient/family/caregiver education Treatment Methods/Modalities: Skilled observation;Differential diagnosis Patient observed directly with PO's: Yes Type of PO's observed: Regular;Thin liquids;Ice chips Feeding: Able to feed self Liquids provided via: Cup;Straw Pharyngeal Phase Signs & Symptoms: Suspected delayed swallow initiation;Delayed cough Type of cueing: Verbal Amount of cueing: Minimal   Moreen Fowler MS,  CCC-SLP [phone]     [facility] 10/31/2011, 4:22 PM

## 2011-11-01 MED ORDER — DABIGATRAN ETEXILATE MESYLATE 150 MG PO CAPS
150.0000 mg | ORAL_CAPSULE | Freq: Two times a day (BID) | ORAL | Status: DC
  Administered 2011-11-01 – 2011-11-02 (×3): 150 mg via ORAL
  Filled 2011-11-01 (×4): qty 1

## 2011-11-01 NOTE — Progress Notes (Signed)
[facility] PROGRESS NOTE  Robert Ochoa WUJ:[id number] DOB: 1946-02-28 DOA: 10/27/2011 PCP: Emeterio Reeve, MD  Assessment/Plan: Active Problems:  Atrial fibrillation  Right ventricular dysplasia  Sepsis  Community acquired pneumonia  Symptomatic bradycardia  Acute renal failure  Acute respiratory failure with hypoxia  Septic shock  Community acquired pneumonia. ARDS vs acute pulmonary edema, resolved, now on ambient air Supplemental oxygen PRN  Bronchodilators PRN CXR: 8/3 >>> Small effusions, stable bilateral lower lobe airspace disease   SIRS, resolved. Symptomatic bradycardia, now paced at 80 with improved hemodynamics Cardiology following  Metoprolol and amiodarone  Would hold Pradaxa for now given neck hematoma   : AKI / Acute renal failure, resolved. Hypokalemia.   Replete K  Trend BMP  Mildly elevated bilirubin, resolved. Self extubated. Dysphagia complaints prior to admission.  P:  Dysphagia 3 diet  SLP to reevaluate  Thrombocytopenia, improving, likely sepsis related.  P:  Trend CBC  Off Pradaxa for now   Code Status: full Family Communication: family updated about patient's clinical progress Disposition Plan:  PT OT evaluation pending  Cultures:  7/30 Blood >>>coag neg staph 1/2  8/2 Blood >>> ntd  Urine strep neg  Legionella Ag neg   Antibiotics:  Azithromycin 7/30 x 1  Ceftriaxone 7/30 >>>7/31  Levaquin 7/30 >>>  Vanc 7/31 >>> 8/3  Brief narrative:    Consultants: Critical care Procedures: 7/30 Admitted to ICU  7/31 Intubated and sedated/paralyzed. Severe hypotension requiring 2 pressors, dobutamine added  8/1 self extubn, off pressors   Antibiotics:  As above    HPI/Subjective: Hemodynamically stable overnight   Objective: Filed Vitals:   10/31/11 1400 10/31/11 1500 10/31/11 2038 11/01/11 0546  BP: 118/77 127/85 127/83 132/82  Pulse: 78 81 83 88  Temp:  98.2 F (36.8 C) 98 F (36.7 C) 100.3 F (37.9 C)    TempSrc:  Oral Oral Oral  Resp: 28 25 18 18   Height:  6\' 2"  (1.88 m)    Weight:  107.548 kg (237 lb 1.6 oz)  106.686 kg (235 lb 3.2 oz)  SpO2: 98% 96% 98% 92%    Intake/Output Summary (Last 24 hours) at 11/01/11 0911 Last data filed at 11/01/11 0843  Gross per 24 hour  Intake    940 ml  Output   1325 ml  Net   -385 ml    Exam:  General: Comfortable, no distress  Neuro: Awake, alert cooperative  HEENT: PERRL  Neck: Supple, no JVD  Cardiovascular: Paced rhythm, no murmurs  Lungs: Scattered rhonchi bilaterally  Abdomen: Soft, nontender, nondistended  Musculoskeletal: No edema, moves all extremities  Skin: No rash    Data Reviewed: Basic Metabolic Panel:  Lab 10/31/11 9562 10/30/11 0441 10/29/11 0450 10/28/11 1132 10/28/11 0325  NA 140 141 141 141 139  K 3.3* 3.4* 3.7 4.1 4.8  CL 104 104 103 110 110  CO2 30 33* 27 21 16*  GLUCOSE 89 98 183* 111* 108*  BUN 23 24* 23 27* 27*  CREATININE 1.12 1.21 1.19 1.52* 1.55*  CALCIUM 8.0* 8.3* 8.1* 7.5* 7.9*  MG -- 2.0 2.1 -- 1.4*  PHOS -- 2.1* 1.7* -- --    Liver Function Tests:  Lab 10/31/11 0446 10/28/11 0325 10/27/11 1733  AST 28 74* 29  ALT 42 57* 19  ALKPHOS 70 33* 34*  BILITOT 0.9 1.4* 1.5*  PROT 5.0* 5.7* 5.3*  ALBUMIN 2.1* 2.7* 2.6*   No results found for this basename: LIPASE:5,AMYLASE:5 in the last 168 hours No results found for  this basename: AMMONIA:5 in the last 168 hours  CBC:  Lab 10/31/11 0446 10/30/11 1935 10/30/11 0441 10/29/11 0450 10/28/11 1132 10/27/11 1733  WBC 8.2 10.9* 14.5* 12.4* 10.2 --  NEUTROABS -- -- -- -- -- 6.8  HGB 11.9* 12.5* 12.2* 13.3 13.9 --  HCT 35.5* 36.8* 37.3* 39.7 40.9 --  MCV 87.2 86.2 86.9 86.5 86.1 --  PLT 108* 98* 96* 113* 131* --    Cardiac Enzymes:  Lab 10/28/11 1848 10/28/11 0841 10/28/11 0241  CKTOTAL 258* 390* 465*  CKMB 5.0* 6.7* 7.9*  CKMBINDEX -- -- --  TROPONINI <0.30 <0.30 <0.30   BNP (last 3 results)  Basename 10/27/11 2340  PROBNP 8743.0*      CBG:  Lab 11/01/11 0023 10/31/11 2354 10/31/11 2000 10/31/11 0734 10/31/11 0403  GLUCAP 96 42* 165* 105* 109*    Recent Results (from the past 240 hour(s))  CULTURE, BLOOD (ROUTINE X 2)     Status: Normal   Collection Time   10/27/11  5:56 PM      Component Value Range Status Comment   Specimen Description BLOOD FEMORAL ARTERY RIGHT   Final    Special Requests BOTTLES DRAWN AEROBIC ONLY 10CC   Final    Culture  Setup Time 10/28/2011 00:49   Final    Culture     Final    Value: STAPHYLOCOCCUS SPECIES (COAGULASE NEGATIVE)     Note: THE SIGNIFICANCE OF ISOLATING THIS ORGANISM FROM A SINGLE SET OF BLOOD CULTURES WHEN MULTIPLE SETS ARE DRAWN IS UNCERTAIN. PLEASE NOTIFY THE MICROBIOLOGY DEPARTMENT WITHIN ONE WEEK IF SPECIATION AND SENSITIVITIES ARE REQUIRED.     Note: Gram Stain Report Called to,Read Back By and Verified With: CHRIS @ 1803 ON 10/28/11 BY GOLLD   Report Status 10/30/2011 FINAL   Final   MRSA PCR SCREENING     Status: Normal   Collection Time   10/27/11 10:25 PM      Component Value Range Status Comment   MRSA by PCR NEGATIVE  NEGATIVE Final   CULTURE, BLOOD (ROUTINE X 2)     Status: Normal (Preliminary result)   Collection Time   10/27/11 11:50 PM      Component Value Range Status Comment   Specimen Description BLOOD LEFT ARM   Final    Special Requests BOTTLES DRAWN AEROBIC ONLY 10CC   Final    Culture  Setup Time 10/28/2011 03:52   Final    Culture     Final    Value:        BLOOD CULTURE RECEIVED NO GROWTH TO DATE CULTURE WILL BE HELD FOR 5 DAYS BEFORE ISSUING A FINAL NEGATIVE REPORT   Report Status PENDING   Incomplete   CULTURE, BLOOD (ROUTINE X 2)     Status: Normal (Preliminary result)   Collection Time   10/30/11  4:15 PM      Component Value Range Status Comment   Specimen Description BLOOD RIGHT ARM   Final    Special Requests BOTTLES DRAWN AEROBIC AND ANAEROBIC 10CC   Final    Culture  Setup Time 10/30/2011 20:45   Final    Culture     Final    Value:         BLOOD CULTURE RECEIVED NO GROWTH TO DATE CULTURE WILL BE HELD FOR 5 DAYS BEFORE ISSUING A FINAL NEGATIVE REPORT   Report Status PENDING   Incomplete   CULTURE, BLOOD (ROUTINE X 2)     Status: Normal (Preliminary result)  Collection Time   10/30/11  4:21 PM      Component Value Range Status Comment   Specimen Description BLOOD LEFT ARM   Final    Special Requests BOTTLES DRAWN AEROBIC AND ANAEROBIC 10CC   Final    Culture  Setup Time 10/30/2011 20:46   Final    Culture     Final    Value:        BLOOD CULTURE RECEIVED NO GROWTH TO DATE CULTURE WILL BE HELD FOR 5 DAYS BEFORE ISSUING A FINAL NEGATIVE REPORT   Report Status PENDING   Incomplete      Studies: Ct Soft Tissue Neck Wo Contrast  10/30/2011  *RADIOLOGY REPORT*  Clinical Data: History of self extubation.  Asymmetric soft tissues in the neck.  Evaluate for potential pneumomediastinum or upper airway laceration.  CT NECK WITHOUT CONTRAST  Technique:  Multidetector CT imaging of the neck was performed without intravenous contrast.  Comparison: No priors.  Findings: On this noncontrast examination, the posterior oropharynx, hypopharynx, and trachea appear grossly normal in appearance.  No surrounding fluid collections are identified within the adjacent neck or the visualized portions of the mediastinum to suggest significant hemorrhage.  Within the visualized portions of the thorax there is no pneumomediastinum.  There is a right-sided internal jugular catheter which extends through the right sternocleidomastoid muscle belly, the tip of which is properly located in the right internal jugular vein extending into the superior vena cava (inferior margin of the catheter is not imaged).  Posterior to the catheter there is a large collection of low - intermediate attenuation (ranging from 6- 40 HU) fluid which measures approximately 5.0 x 5.8 cm in maximal axial dimensions, and measures approximate 7.1 cm in height. This is immediately lateral to the  right scalene musculature.  Please see contemporaneous chest CT dated 10/30/2011 for full description of a thoracic findings.  IMPRESSION: 1.  Large fluid collection in the right lateral neck, as above, which measures a low - intermediate attenuation.  Some of this fluid may represents infused fluid from a misplaced IV catheter (although the existing right IJ catheter appears to be appropriately located), particularly if the patient also dislodged a central venous catheter at the time of recent self extubation. However, some of this fluid (particularly more inferiorly) but has higher attenuation characteristics and likely represents hemorrhage from the underlying veins (either the internal jugular or right subclavian vein). 2.  The upper airway is grossly unremarkable in appearance.  Original Report Authenticated By: Florencia Reasons, M.D.   Ct Chest Wo Contrast  10/30/2011  *RADIOLOGY REPORT*  Clinical Data: History of trauma from self-extubation.  Evaluate for potential pneumomediastinum or tracheal laceration.  CT CHEST WITHOUT CONTRAST  Technique:  Multidetector CT imaging of the chest was performed following the standard protocol without IV contrast.  Comparison: No priors.  Findings:  Mediastinum: Right IJ central venous catheter with tip terminating at the junction of the brachiocephalic veins.  A left-sided pacemaker device in place with lead tips terminating in the right atrial appendage and right ventricular apex. Heart size is mildly enlarged. There is no significant pericardial fluid, thickening or pericardial calcification.  Dilatation of the pulmonic trunk (4 cm in diameter), suggestive of pulmonary arterial hypertension. Numerous reactive size mediastinal and hilar lymph nodes.  No definite pathologic nodal enlargement.  Numerous subcarinal and right hilar and infrahilar calcified lymph nodes, suggesting sequelae of old granulomatous disease.  Esophagus is unremarkable in appearance.  No  pneumomediastinum.  The  trachea and large airways are grossly unremarkable in appearance.  Lungs/Pleura: Small bilateral pleural effusions (right greater than left).  A combination of volume loss and air space consolidation in the lower lobes of the lungs bilaterally, and to a lesser extent within the right middle lobe and inferior segment of the lingula. Overall pattern suggests sequelae of aspiration.  No definite suspicious appearing pulmonary nodules or masses are identified.  Upper Abdomen: Gallbladder is moderately distended.  No definite surrounding inflammatory changes to strongly suggest the presence of an acute cholecystitis at this time.  No radiopaque gallstones are identified.  The remainder of the upper abdomen is otherwise unremarkable in appearance.  Musculoskeletal: There are no aggressive appearing lytic or blastic lesions noted in the visualized portions of the skeleton.  IMPRESSION: 1.  No evidence of pneumomediastinum or definite findings to suggest tracheal laceration related to recent self extubation. 2.  Appearance of the lungs suggests sequelae of aspiration with bilateral lower lobe aspiration pneumonia and areas of pneumonitis scattered throughout the lungs bilaterally. 3.  Dilatation of the pulmonic trunk suggestive of pulmonary arterial hypertension. 4.  Mild cardiomegaly. 5.  Small bilateral pleural effusions layering dependently (right greater than left).  6.  Sequelae of old granulomatous disease, as above.  Original Report Authenticated By: Florencia Reasons, M.D.   Dg Chest Port 1 View  10/31/2011  *RADIOLOGY REPORT*  Clinical Data: Effusions, right lower lobe pneumonia  PORTABLE CHEST - 1 VIEW  Comparison: CT chest dated 10/30/2011  Findings: Elevation of the right hemidiaphragm.  Patchy bilateral lower lobe opacities, atelectasis versus pneumonia.  Suspected small right and trace left pleural effusions.  No pneumothorax is seen.  Cardiomegaly.  Left subclavian ICD.  Stable  right IJ venous catheter.  IMPRESSION: Patchy bilateral lower lobe opacities, atelectasis versus pneumonia.  Suspected small right and trace left pleural effusions.  Original Report Authenticated By: Charline Bills, M.D.   Dg Chest Port 1 View  10/30/2011  *RADIOLOGY REPORT*  Clinical Data: Shortness of breath and cough.  PORTABLE CHEST - 1 VIEW  Comparison: 10/29/2011  Findings: Portable semi upright view of the chest was obtained. The right subclavian central line remains left of midline and in an indeterminate location.  The patient has a left cardiac ICD.  There are increased densities in both lungs concerning for worsening pulmonary edema and possible pleural effusions.  Heart size remains enlarged.  Endotracheal tube has been removed.  Right jugular central line is most likely in the upper SVC.  IMPRESSION: Increased densities in both lungs are concerning for decreased lung volumes and possibly increased pulmonary edema.  Bilateral pleural effusions.  Cardiomegaly.  Position of the right subclavian central line is unchanged and the location remains indeterminate.  Original Report Authenticated By: Richarda Overlie, M.D.   Dg Chest Port 1 View  10/29/2011  *RADIOLOGY REPORT*  Clinical Data: Check ET tube position  PORTABLE CHEST - 1 VIEW  Comparison: 10/28/2011  Findings: Endotracheal tube terminates at the thoracic inlet, 6 cm above the carina.  Stable right subclavian line crossing the midline, likely within the subclavian artery.  Stable right IJ venous catheter and left subclavian ICD.  Cardiomegaly.  Pulmonary vascular congestion with possible mild interstitial edema.  Bilateral pleural effusions, left greater than right, improved.  Bilateral lower lobe atelectasis.  No pneumothorax is seen.  IMPRESSION: Endotracheal tube terminates at the thoracic inlet, 6 cm above the carina.  Stable support apparatus, including a suspected right subclavian arterial line.  Cardiomegaly with mild interstitial  edema and  bilateral pleural effusions, improved.  The suspected arterial line was called by telephone on 10/29/2011 at 0755 to the floor nurse caring for the patient, who confirmed that the line was arterial. Please note that the tip of the line projects centrally over the great vessels.  Original Report Authenticated By: Charline Bills, M.D.   Dg Chest Port 1 View  10/28/2011  *RADIOLOGY REPORT*  Clinical Data: Central line placement  PORTABLE CHEST - 1 VIEW  Comparison: 10/28/2011  Findings: Right IJ catheter tip projects over the proximal SVC. Right subclavian catheter tip again projects over the midchest, indeterminate location.  Otherwise no interval change in support devices.  The carina is difficult to visualize.  Cardiomegaly. Bibasilar opacities and large effusions.  No pneumothorax.  IMPRESSION: Right IJ catheter tip projects over the proximal SVC.  Right subclavian catheter is unchanged, projecting over midline, indeterminate location.  Original Report Authenticated By: Waneta Martins, M.D.   Dg Chest Port 1 View  10/28/2011  *RADIOLOGY REPORT*  Clinical Data: Endotracheal tube and central line placement.  PORTABLE CHEST - 1 VIEW  Comparison: 10/27/2011  Findings: Endotracheal tube has been placed, tip approximately 5.9 cm above carina.  The patient has a new right central line, tip overlying the midline.  This raises a question of location of 10. Assessment for blood return is recommended.  The heart is enlarged.  There are diffuse changes of pulmonary edema.  The patient has a left-sided transvenous pacemaker, leads to the right atrium and right ventricle.  IMPRESSION:  1.  Right-sided central line as described.  The tip cannot be confirmed to be in the superior vena cava.  Assess for blood return is recommended. 2.  Endotracheal tube as described.  Critical test results telephoned to Drs. Hunter and Zubelevitskiy at the time of interpretation on date 10/28/2011 at time 1:53 a.m.  Original Report  Authenticated By: Patterson Hammersmith, M.D.   Dg Chest Port 1 View  10/27/2011  *RADIOLOGY REPORT*  Clinical Data: Weakness and dehydration.  PORTABLE CHEST - 1 VIEW  Comparison: 05/07/2011  Findings: There is a new opacity along the right cardiac border. The opacity is most likely within the right middle lobe.  Again noted is a left cardiac ICD.  Heart size is upper limits of normal but unchanged.  No evidence of pulmonary edema.  Left lung remains clear.  IMPRESSION: New opacity in the right middle lobe region.  Findings are concerning for pneumonia.  Recommend follow-up to ensure resolution.  Original Report Authenticated By: Richarda Overlie, M.D.    Scheduled Meds:   . amiodarone  200 mg Oral Daily  . escitalopram  20 mg Oral Daily  . levofloxacin (LEVAQUIN) IV  750 mg Intravenous Q24H  . LORazepam  1 mg Oral Once  . metoprolol tartrate  12.5 mg Oral BID  . potassium chloride  40 mEq Per Tube Q4H  . DISCONTD: antiseptic oral rinse  15 mL Mouth Rinse BID  . DISCONTD: insulin aspart  0-4 Units Subcutaneous Q4H  . DISCONTD: vancomycin  1,000 mg Intravenous Q12H   Continuous Infusions:   . DISCONTD: dextrose      Active Problems:  Atrial fibrillation  Right ventricular dysplasia  Sepsis  Community acquired pneumonia  Symptomatic bradycardia  Acute renal failure  Acute respiratory failure with hypoxia  Septic shock    Time spent: 40 minutes   [facility]  [facility] Pager [phone]. If 8PM-8AM, please contact night-coverage at [email or web address], password [facility] 11/01/2011, 9:11 AM  LOS: 5 days

## 2011-11-02 DIAGNOSIS — I472 Ventricular tachycardia: Secondary | ICD-10-CM

## 2011-11-02 LAB — GLUCOSE, CAPILLARY
Glucose-Capillary: 99 mg/dL (ref 70–99)
Glucose-Capillary: 96 mg/dL (ref 70–99)

## 2011-11-02 LAB — CULTURE, BLOOD (ROUTINE X 2)

## 2011-11-02 LAB — CBC
HCT: 37.1 % — ABNORMAL LOW (ref 39.0–52.0)
Hemoglobin: 12.7 g/dL — ABNORMAL LOW (ref 13.0–17.0)
MCH: 29 pg (ref 26.0–34.0)
MCHC: 34.2 g/dL (ref 30.0–36.0)
MCV: 84.7 fL (ref 78.0–100.0)

## 2011-11-02 LAB — BASIC METABOLIC PANEL
BUN: 20 mg/dL (ref 6–23)
Creatinine, Ser: 0.96 mg/dL (ref 0.50–1.35)
GFR calc non Af Amer: 84 mL/min — ABNORMAL LOW (ref >90)
Glucose, Bld: 86 mg/dL (ref 70–99)
Potassium: 3.8 mEq/L (ref 3.5–5.1)

## 2011-11-02 MED ORDER — GUAIFENESIN ER 600 MG PO TB12: 1200.0000 mg | ORAL_TABLET | Freq: Two times a day (BID) | ORAL | Status: DC

## 2011-11-02 MED ORDER — SACCHAROMYCES BOULARDII 250 MG PO CAPS: 250.0000 mg | ORAL_CAPSULE | Freq: Two times a day (BID) | ORAL | Status: AC

## 2011-11-02 MED ORDER — LORAZEPAM 0.5 MG PO TABS
1.0000 mg | ORAL_TABLET | Freq: Every evening | ORAL | Status: DC | PRN
  Administered 2011-11-02: 0.5 mg via ORAL
  Filled 2011-11-02: qty 1

## 2011-11-02 MED ORDER — METOPROLOL TARTRATE 50 MG PO TABS
50.0000 mg | ORAL_TABLET | Freq: Two times a day (BID) | ORAL | Status: DC
  Administered 2011-11-02: 50 mg via ORAL
  Filled 2011-11-02 (×2): qty 1

## 2011-11-02 MED ORDER — LEVOFLOXACIN 750 MG PO TABS: 750.0000 mg | ORAL_TABLET | Freq: Every day | ORAL | Status: AC

## 2011-11-02 MED ORDER — ALBUTEROL SULFATE HFA 108 (90 BASE) MCG/ACT IN AERS: 4.0000 | INHALATION_SPRAY | Freq: Four times a day (QID) | RESPIRATORY_TRACT | Status: DC | PRN

## 2011-11-02 MED ORDER — LEVOFLOXACIN 750 MG PO TABS
750.0000 mg | ORAL_TABLET | Freq: Every day | ORAL | Status: DC
  Administered 2011-11-02: 750 mg via ORAL
  Filled 2011-11-02: qty 1

## 2011-11-02 NOTE — Discharge Summary (Signed)
Physician Discharge Summary  Robert Ochoa MRN: [id number] DOB/AGE: [birth year] 67 y.o.  PCP: Emeterio Reeve, MD   Admit date: 10/27/2011 Discharge date: 11/02/2011  Discharge Diagnoses:     Atrial fibrillation  Right ventricular dysplasia  Sepsis  Community acquired pneumonia  Symptomatic bradycardia  Acute renal failure  Acute respiratory failure with hypoxia  Septic shock   Medication List  As of 11/02/2011 11:52 AM   TAKE these medications         albuterol 108 (90 BASE) MCG/ACT inhaler   Commonly known as: PROVENTIL HFA;VENTOLIN HFA   Inhale 4 puffs into the lungs every 6 (six) hours as needed for wheezing.      amiodarone 200 MG tablet   Commonly known as: PACERONE   Take 1 tablet (200 mg total) by mouth daily.      CAL-MAG-ZINC PO   Take 1 tablet by mouth daily.      dabigatran 150 MG Caps   Commonly known as: PRADAXA   Take 1 capsule (150 mg total) by mouth every 12 (twelve) hours.      escitalopram 20 MG tablet   Commonly known as: LEXAPRO   Take 20 mg by mouth daily.      Fish Oil 1000 MG Caps   Take 3 capsules by mouth daily.      fluticasone 50 MCG/ACT nasal spray   Commonly known as: FLONASE   Place 2 sprays into the nose daily as needed. For congestion      guaiFENesin 600 MG 12 hr tablet   Commonly known as: MUCINEX   Take 2 tablets (1,200 mg total) by mouth 2 (two) times daily.      levofloxacin 750 MG tablet   Commonly known as: LEVAQUIN   Take 1 tablet (750 mg total) by mouth daily.      metoprolol tartrate 25 MG tablet   Commonly known as: LOPRESSOR   Take 3 tablets (75 mg total) by mouth 2 (two) times daily.      saccharomyces boulardii 250 MG capsule   Commonly known as: FLORASTOR   Take 1 capsule (250 mg total) by mouth 2 (two) times daily.      vitamin C 500 MG tablet   Commonly known as: ASCORBIC ACID   Take 1,000 mg by mouth daily.            Discharge Condition: Stable   Disposition: 01-Home or Self  Care   Consults:  #1 cardiology #2 critical care  Significant Diagnostic Studies: Ct Soft Tissue Neck Wo Contrast  10/30/2011  *RADIOLOGY REPORT*  Clinical Data: History of self extubation.  Asymmetric soft tissues in the neck.  Evaluate for potential pneumomediastinum or upper airway laceration.  CT NECK WITHOUT CONTRAST  Technique:  Multidetector CT imaging of the neck was performed without intravenous contrast.  Comparison: No priors.  Findings: On this noncontrast examination, the posterior oropharynx, hypopharynx, and trachea appear grossly normal in appearance.  No surrounding fluid collections are identified within the adjacent neck or the visualized portions of the mediastinum to suggest significant hemorrhage.  Within the visualized portions of the thorax there is no pneumomediastinum.  There is a right-sided internal jugular catheter which extends through the right sternocleidomastoid muscle belly, the tip of which is properly located in the right internal jugular vein extending into the superior vena cava (inferior margin of the catheter is not imaged).  Posterior to the catheter there is a large collection of low - intermediate attenuation (ranging from  6- 40 HU) fluid which measures approximately 5.0 x 5.8 cm in maximal axial dimensions, and measures approximate 7.1 cm in height. This is immediately lateral to the right scalene musculature.  Please see contemporaneous chest CT dated 10/30/2011 for full description of a thoracic findings.  IMPRESSION: 1.  Large fluid collection in the right lateral neck, as above, which measures a low - intermediate attenuation.  Some of this fluid may represents infused fluid from a misplaced IV catheter (although the existing right IJ catheter appears to be appropriately located), particularly if the patient also dislodged a central venous catheter at the time of recent self extubation. However, some of this fluid (particularly more inferiorly) but has higher  attenuation characteristics and likely represents hemorrhage from the underlying veins (either the internal jugular or right subclavian vein). 2.  The upper airway is grossly unremarkable in appearance.  Original Report Authenticated By: Florencia Reasons, M.D.   Ct Chest Wo Contrast  10/30/2011  *RADIOLOGY REPORT*  Clinical Data: History of trauma from self-extubation.  Evaluate for potential pneumomediastinum or tracheal laceration.  CT CHEST WITHOUT CONTRAST  Technique:  Multidetector CT imaging of the chest was performed following the standard protocol without IV contrast.  Comparison: No priors.  Findings:  Mediastinum: Right IJ central venous catheter with tip terminating at the junction of the brachiocephalic veins.  A left-sided pacemaker device in place with lead tips terminating in the right atrial appendage and right ventricular apex. Heart size is mildly enlarged. There is no significant pericardial fluid, thickening or pericardial calcification.  Dilatation of the pulmonic trunk (4 cm in diameter), suggestive of pulmonary arterial hypertension. Numerous reactive size mediastinal and hilar lymph nodes.  No definite pathologic nodal enlargement.  Numerous subcarinal and right hilar and infrahilar calcified lymph nodes, suggesting sequelae of old granulomatous disease.  Esophagus is unremarkable in appearance.  No pneumomediastinum.  The trachea and large airways are grossly unremarkable in appearance.  Lungs/Pleura: Small bilateral pleural effusions (right greater than left).  A combination of volume loss and air space consolidation in the lower lobes of the lungs bilaterally, and to a lesser extent within the right middle lobe and inferior segment of the lingula. Overall pattern suggests sequelae of aspiration.  No definite suspicious appearing pulmonary nodules or masses are identified.  Upper Abdomen: Gallbladder is moderately distended.  No definite surrounding inflammatory changes to strongly  suggest the presence of an acute cholecystitis at this time.  No radiopaque gallstones are identified.  The remainder of the upper abdomen is otherwise unremarkable in appearance.  Musculoskeletal: There are no aggressive appearing lytic or blastic lesions noted in the visualized portions of the skeleton.  IMPRESSION: 1.  No evidence of pneumomediastinum or definite findings to suggest tracheal laceration related to recent self extubation. 2.  Appearance of the lungs suggests sequelae of aspiration with bilateral lower lobe aspiration pneumonia and areas of pneumonitis scattered throughout the lungs bilaterally. 3.  Dilatation of the pulmonic trunk suggestive of pulmonary arterial hypertension. 4.  Mild cardiomegaly. 5.  Small bilateral pleural effusions layering dependently (right greater than left).  6.  Sequelae of old granulomatous disease, as above.  Original Report Authenticated By: Florencia Reasons, M.D.   Dg Chest Port 1 View  10/31/2011  *RADIOLOGY REPORT*  Clinical Data: Effusions, right lower lobe pneumonia  PORTABLE CHEST - 1 VIEW  Comparison: CT chest dated 10/30/2011  Findings: Elevation of the right hemidiaphragm.  Patchy bilateral lower lobe opacities, atelectasis versus pneumonia.  Suspected small right  and trace left pleural effusions.  No pneumothorax is seen.  Cardiomegaly.  Left subclavian ICD.  Stable right IJ venous catheter.  IMPRESSION: Patchy bilateral lower lobe opacities, atelectasis versus pneumonia.  Suspected small right and trace left pleural effusions.  Original Report Authenticated By: Charline Bills, M.D.   Dg Chest Port 1 View  10/30/2011  *RADIOLOGY REPORT*  Clinical Data: Shortness of breath and cough.  PORTABLE CHEST - 1 VIEW  Comparison: 10/29/2011  Findings: Portable semi upright view of the chest was obtained. The right subclavian central line remains left of midline and in an indeterminate location.  The patient has a left cardiac ICD.  There are increased densities  in both lungs concerning for worsening pulmonary edema and possible pleural effusions.  Heart size remains enlarged.  Endotracheal tube has been removed.  Right jugular central line is most likely in the upper SVC.  IMPRESSION: Increased densities in both lungs are concerning for decreased lung volumes and possibly increased pulmonary edema.  Bilateral pleural effusions.  Cardiomegaly.  Position of the right subclavian central line is unchanged and the location remains indeterminate.  Original Report Authenticated By: Richarda Overlie, M.D.   Dg Chest Port 1 View  10/29/2011  *RADIOLOGY REPORT*  Clinical Data: Check ET tube position  PORTABLE CHEST - 1 VIEW  Comparison: 10/28/2011  Findings: Endotracheal tube terminates at the thoracic inlet, 6 cm above the carina.  Stable right subclavian line crossing the midline, likely within the subclavian artery.  Stable right IJ venous catheter and left subclavian ICD.  Cardiomegaly.  Pulmonary vascular congestion with possible mild interstitial edema.  Bilateral pleural effusions, left greater than right, improved.  Bilateral lower lobe atelectasis.  No pneumothorax is seen.  IMPRESSION: Endotracheal tube terminates at the thoracic inlet, 6 cm above the carina.  Stable support apparatus, including a suspected right subclavian arterial line.  Cardiomegaly with mild interstitial edema and bilateral pleural effusions, improved.  The suspected arterial line was called by telephone on 10/29/2011 at 0755 to the floor nurse caring for the patient, who confirmed that the line was arterial. Please note that the tip of the line projects centrally over the great vessels.  Original Report Authenticated By: Charline Bills, M.D.   Dg Chest Port 1 View  10/28/2011  *RADIOLOGY REPORT*  Clinical Data: Central line placement  PORTABLE CHEST - 1 VIEW  Comparison: 10/28/2011  Findings: Right IJ catheter tip projects over the proximal SVC. Right subclavian catheter tip again projects over the  midchest, indeterminate location.  Otherwise no interval change in support devices.  The carina is difficult to visualize.  Cardiomegaly. Bibasilar opacities and large effusions.  No pneumothorax.  IMPRESSION: Right IJ catheter tip projects over the proximal SVC.  Right subclavian catheter is unchanged, projecting over midline, indeterminate location.  Original Report Authenticated By: Waneta Martins, M.D.   Dg Chest Port 1 View  10/28/2011  *RADIOLOGY REPORT*  Clinical Data: Endotracheal tube and central line placement.  PORTABLE CHEST - 1 VIEW  Comparison: 10/27/2011  Findings: Endotracheal tube has been placed, tip approximately 5.9 cm above carina.  The patient has a new right central line, tip overlying the midline.  This raises a question of location of 10. Assessment for blood return is recommended.  The heart is enlarged.  There are diffuse changes of pulmonary edema.  The patient has a left-sided transvenous pacemaker, leads to the right atrium and right ventricle.  IMPRESSION:  1.  Right-sided central line as described.  The tip cannot  be confirmed to be in the superior vena cava.  Assess for blood return is recommended. 2.  Endotracheal tube as described.  Critical test results telephoned to Drs. Hunter and Zubelevitskiy at the time of interpretation on date 10/28/2011 at time 1:53 a.m.  Original Report Authenticated By: Patterson Hammersmith, M.D.   Dg Chest Port 1 View  10/27/2011  *RADIOLOGY REPORT*  Clinical Data: Weakness and dehydration.  PORTABLE CHEST - 1 VIEW  Comparison: 05/07/2011  Findings: There is a new opacity along the right cardiac border. The opacity is most likely within the right middle lobe.  Again noted is a left cardiac ICD.  Heart size is upper limits of normal but unchanged.  No evidence of pulmonary edema.  Left lung remains clear.  IMPRESSION: New opacity in the right middle lobe region.  Findings are concerning for pneumonia.  Recommend follow-up to ensure resolution.   Original Report Authenticated By: Richarda Overlie, M.D.    2-D echo Syncope. Atrial fibrillation.  ------------------------------------------------------------ Study Conclusions  - Left ventricle: Abnormal septal motion The cavity size was normal. Wall thickness was normal. The estimated ejection fraction was 35%. Diffuse hypokinesis. - Right ventricle: The cavity size was moderately dilated. - Impressions: Bubble study was positive for right to left shunt but somewhat late indicating possible pulmonary origin Impressions:  - Bubble study was positive for right to left shunt but somewhat late indicating possible pulmonary origin     Microbiology: Recent Results (from the past 240 hour(s))  CULTURE, BLOOD (ROUTINE X 2)     Status: Normal   Collection Time   10/27/11  5:56 PM      Component Value Range Status Comment   Specimen Description BLOOD FEMORAL ARTERY RIGHT   Final    Special Requests BOTTLES DRAWN AEROBIC ONLY 10CC   Final    Culture  Setup Time 10/28/2011 00:49   Final    Culture     Final    Value: STAPHYLOCOCCUS SPECIES (COAGULASE NEGATIVE)     Note: THE SIGNIFICANCE OF ISOLATING THIS ORGANISM FROM A SINGLE SET OF BLOOD CULTURES WHEN MULTIPLE SETS ARE DRAWN IS UNCERTAIN. PLEASE NOTIFY THE MICROBIOLOGY DEPARTMENT WITHIN ONE WEEK IF SPECIATION AND SENSITIVITIES ARE REQUIRED.     Note: Gram Stain Report Called to,Read Back By and Verified With: CHRIS @ 1803 ON 10/28/11 BY GOLLD   Report Status 10/30/2011 FINAL   Final    Organism ID, Bacteria STAPHYLOCOCCUS SPECIES (COAGULASE NEGATIVE)   Final   MRSA PCR SCREENING     Status: Normal   Collection Time   10/27/11 10:25 PM      Component Value Range Status Comment   MRSA by PCR NEGATIVE  NEGATIVE Final   CULTURE, BLOOD (ROUTINE X 2)     Status: Normal (Preliminary result)   Collection Time   10/27/11 11:50 PM      Component Value Range Status Comment   Specimen Description BLOOD LEFT ARM   Final    Special Requests BOTTLES  DRAWN AEROBIC ONLY 10CC   Final    Culture  Setup Time 10/28/2011 03:52   Final    Culture     Final    Value:        BLOOD CULTURE RECEIVED NO GROWTH TO DATE CULTURE WILL BE HELD FOR 5 DAYS BEFORE ISSUING A FINAL NEGATIVE REPORT   Report Status PENDING   Incomplete   CULTURE, BLOOD (ROUTINE X 2)     Status: Normal (Preliminary result)   Collection Time  10/30/11  4:15 PM      Component Value Range Status Comment   Specimen Description BLOOD RIGHT ARM   Final    Special Requests BOTTLES DRAWN AEROBIC AND ANAEROBIC 10CC   Final    Culture  Setup Time 10/30/2011 20:45   Final    Culture     Final    Value:        BLOOD CULTURE RECEIVED NO GROWTH TO DATE CULTURE WILL BE HELD FOR 5 DAYS BEFORE ISSUING A FINAL NEGATIVE REPORT   Report Status PENDING   Incomplete   CULTURE, BLOOD (ROUTINE X 2)     Status: Normal (Preliminary result)   Collection Time   10/30/11  4:21 PM      Component Value Range Status Comment   Specimen Description BLOOD LEFT ARM   Final    Special Requests BOTTLES DRAWN AEROBIC AND ANAEROBIC 10CC   Final    Culture  Setup Time 10/30/2011 20:46   Final    Culture     Final    Value:        BLOOD CULTURE RECEIVED NO GROWTH TO DATE CULTURE WILL BE HELD FOR 5 DAYS BEFORE ISSUING A FINAL NEGATIVE REPORT   Report Status PENDING   Incomplete      Labs: Results for orders placed during the hospital encounter of 10/27/11 (from the past 48 hour(s))  GLUCOSE, CAPILLARY     Status: Abnormal   Collection Time   10/31/11  8:00 PM      Component Value Range Comment   Glucose-Capillary 165 (*) 70 - 99 mg/dL   GLUCOSE, CAPILLARY     Status: Abnormal   Collection Time   10/31/11 11:54 PM      Component Value Range Comment   Glucose-Capillary 42 (*) 70 - 99 mg/dL    Comment 1 Notify RN     GLUCOSE, CAPILLARY     Status: Normal   Collection Time   11/01/11 12:23 AM      Component Value Range Comment   Glucose-Capillary 96  70 - 99 mg/dL   CBC     Status: Abnormal   Collection Time    11/02/11  5:34 AM      Component Value Range Comment   WBC 7.7  4.0 - 10.5 K/uL    RBC 4.38  4.22 - 5.81 MIL/uL    Hemoglobin 12.7 (*) 13.0 - 17.0 g/dL    HCT 95.6 (*) 21.3 - 52.0 %    MCV 84.7  78.0 - 100.0 fL    MCH 29.0  26.0 - 34.0 pg    MCHC 34.2  30.0 - 36.0 g/dL    RDW 08.6  57.8 - 46.9 %    Platelets 129 (*) 150 - 400 K/uL   BASIC METABOLIC PANEL     Status: Abnormal   Collection Time   11/02/11  5:34 AM      Component Value Range Comment   Sodium 135  135 - 145 mEq/L    Potassium 3.8  3.5 - 5.1 mEq/L    Chloride 102  96 - 112 mEq/L    CO2 23  19 - 32 mEq/L    Glucose, Bld 86  70 - 99 mg/dL    BUN 20  6 - 23 mg/dL    Creatinine, Ser 6.29  0.50 - 1.35 mg/dL    Calcium 8.1 (*) 8.4 - 10.5 mg/dL    GFR calc non Af Amer 84 (*) >90 mL/min  GFR calc Af Amer >90  >90 mL/min      HPI  Pt is a 66 yo man with ARVD, VT, AF s/p dual chamber ICD. He presents with prod cough, fever, chills - found to have PNA and sepsis/septic shock. Pt reports that he has not felt well for about a month and has had a nagging cough. Last night, he started having chills and fevers. This am, he felt dizzy when standing up and felt as if he was going to pass out and went to his PCP and was sent to the ED. He denies syncope. No LH prior to this episode. He denies SOB, PND, orthopnea, LE edema, CP, ICD shocks. He feels nauseous at this time, but has not vomited. He has not been able to eat all day. He also reports chronic abd pain that he believes is due to a hernia and feels better when he is sitting up in a chair.  While in the ED today, pt's monitor was noted to show HR in 30's per RN and pt reported nausea at the time and so was given atropine. No tele strips were recorded of this event. It is not known if pacing spikes were shown on the monitor.       HOSPITAL COURSE:  #1 SIRS, resolved.  7/30 Admitted to ICU  7/31 Intubated and sedated/paralyzed. Severe hypotension requiring 2 pressors, dobutamine  added  8/1 self extubn, off pressors Antibiotics administered were Azithromycin 7/30 x 1  Ceftriaxone 7/30 >>>7/31  Levaquin 7/30 to be continued  for another 8 days by mouth Vanc 7/31 >>> 8/3    #2 Symptomatic bradycardia, /atrial fibrillation cardiology had his ICD reprogrammed this am to DDD with a lower rate at 60 bpm Cardiology following , increase metoprolol to 50 mg twice a day and then 75 mg by twice daily Metoprolol and amiodarone resumed Resumed Pradaxa which was temporarily on hold because of a neck hematoma   #3 ARVD/ h/o VT  No sustained VT  Increase metoprolol to 50mg  BID  This should be further uptitrated to 75mg  BID upon follow-up with cardiology PA next week. He should follow-up with a [facility] cardiology PA in 1 week post discharge. Cardiology If he remains in afib at that time, then he should have cardioversion in 3 weeks.    #4 AKI / Acute renal failure, prerenal presented with a creatinine of 1.55 with improvement down to 1.2 to resolved. Hypokalemia.  Replete K  Will need repeat BMP in the outpatient setting   Mildly elevated bilirubin, resolved. Self extubated. Dysphagia complaints prior to admission.  P:  Dysphagia 3 diet  SLP evaluated and recommended a regular diet with thin liquids  Thrombocytopenia, improving, likely sepsis related.  Last platelet count was 98,000. Continue to monitor closely in the outpatient setting     Discharge Exam:  Blood pressure 136/89, pulse 92, temperature 97.8 F (36.6 C), temperature source Oral, resp. rate 18, height 6\' 2"  (1.88 m), weight 106 kg (233 lb 11 oz), SpO2 92.00%.  Telemetry reveals afib, frequent V pacing at 80 bpm  GEN- The patient is well appearing, alert and oriented x 3 today.  Head- normocephalic, atraumatic  Eyes- Sclera clear, conjunctiva pink  Ears- hearing intact  Oropharynx- clear  Neck- ecchymosis R neck  Lungs- Coarse BS at R base, normal work of breathing  Heart- iRRR  GI- soft, NT,  ND, + BS  Extremities- no clubbing, cyanosis, or edema  Skin- no rash or lesion  Psych-  euthymic mood, full affect  Neuro- strength and sensation are intact    Discharge Orders    Future Appointments: Provider: Department: Dept Phone: Center:   12/25/2011 10:45 AM Hillis Range, MD [facility] [phone] [facility]     Future Orders Please Complete By Expires   Diet - low sodium heart healthy      Increase activity slowly      Call MD for:  temperature >100.4      Call MD for:  difficulty breathing, headache or visual disturbances      Call MD for:  persistant dizziness or light-headedness         Follow-up Information    Follow up with Hillis Range, MD. Schedule an appointment as soon as possible for a visit in 1 week. (Posthospitalization followup)    Contact information:   [address], [address] [place] [address] [phone]       Follow up with Emeterio Reeve, MD. Schedule an appointment as soon as possible for a visit in 1 week. (Posthospitalization followup)    Contact information:   [address] [place] [address] [phone]          Signed: Richarda Overlie 11/02/2011, 11:52 AM

## 2011-11-02 NOTE — Evaluation (Signed)
Occupational Therapy Evaluation Patient Details Name: Robert Ochoa MRN: [id number] DOB: 08-10-1945 Today's Date: 11/02/2011 Time: 4098-1191 OT Time Calculation (min): 21 min  OT Assessment / Plan / Recommendation Clinical Impression   presents with Sepsis and PNA. pt very motivated to improve mobility.  pt wants to D/C to home and notes family can stop  daily, but not 24/7.  Pt will benefit from skilled OT to increase  I with ADL activity and return to PLOF        Follow Up Recommendations  Home health OT       Equipment Recommendations  None recommended by OT          Precautions / Restrictions Precautions Precautions: Fall Restrictions Weight Bearing Restrictions: No       ADL  Grooming: Performed;Wash/dry hands Where Assessed - Grooming: Unsupported standing Upper Body Bathing: Simulated;Set up Where Assessed - Upper Body Bathing: Unsupported sitting Lower Body Bathing: Performed;Supervision/safety Where Assessed - Lower Body Bathing: Unsupported sit to stand Upper Body Dressing: Simulated;Set up Where Assessed - Upper Body Dressing: Unsupported sitting Lower Body Dressing: Performed;Set up Where Assessed - Lower Body Dressing: Unsupported standing;Unsupported sit to stand Toilet Transfer: Research scientist (life sciences) Method: Sit to Barista: Regular height toilet;Grab bars Toileting - Clothing Manipulation and Hygiene: Performed;Supervision/safety Where Assessed - Toileting Clothing Manipulation and Hygiene: Standing Transfers/Ambulation Related to ADLs: Discussed energy conservation strategies as pt fatigued quickly during OT eval. Hand out provided          Visit Information  Last OT Received On: 11/02/11 Assistance Needed: +1       Prior Functioning  Vision/Perception  Home Living Lives With: Alone Available Help at Discharge: Available PRN/intermittently Type of Home: House Home Access: Stairs to  enter Secretary/administrator of Steps: 3 Home Layout: One level Bathroom Shower/Tub: Engineer, manufacturing systems: Standard Home Adaptive Equipment: None Prior Function Level of Independence: Independent Able to Take Stairs?: Yes Driving: Yes Vocation: Full time employment Communication Communication: No difficulties      Cognition  Overall Cognitive Status: Appears within functional limits for tasks assessed/performed Arousal/Alertness: Awake/alert Orientation Level: Oriented X4 / Intact Behavior During Session: [facility] for tasks performed    Extremity/Trunk Assessment Right Upper Extremity Assessment RUE ROM/Strength/Tone: [facility] for tasks assessed Left Upper Extremity Assessment LUE ROM/Strength/Tone: [facility] for tasks assessed   Mobility Bed Mobility Bed Mobility: Not assessed Transfers Sit to Stand: 5: Supervision;With armrests;From chair/3-in-1 Stand to Sit: 5: Supervision;With upper extremity assist;To chair/3-in-1;With armrests Details for Transfer Assistance: cues for use of UEs         End of Session OT - End of Session Activity Tolerance: Patient limited by fatigue Patient left: in chair  GO     Robert Ochoa, [place] 11/02/2011, 11:49 AM

## 2011-11-02 NOTE — Progress Notes (Signed)
SUBJECTIVE: The patient is doing better today.  At this time, he denies chest pain, shortness of breath, or any new concerns.  He feels quite fatigued.     [name] amiodarone  200 mg Oral Daily  . dabigatran  150 mg Oral Q12H  . escitalopram  20 mg Oral Daily  . levofloxacin (LEVAQUIN) IV  750 mg Intravenous Q24H  . metoprolol tartrate  50 mg Oral BID  . DISCONTD: metoprolol tartrate  12.5 mg Oral BID      OBJECTIVE: Physical Exam: Filed Vitals:   11/01/11 1400 11/01/11 2126 11/02/11 0241 11/02/11 0607  BP: 129/77 127/91 163/91 135/72  Pulse: 92 81 82 79  Temp: 98.4 F (36.9 C) 98.7 F (37.1 C) 98 F (36.7 C) 98.2 F (36.8 C)  TempSrc: Oral Oral Oral Oral  Resp: 20 18 16 18   Height:      Weight:    233 lb 11 oz (106 kg)  SpO2: 94% 91% 93% 97%    Intake/Output Summary (Last 24 hours) at 11/02/11 0801 Last data filed at 11/01/11 1730  Gross per 24 hour  Intake    840 ml  Output    600 ml  Net    240 ml    Telemetry reveals afib, frequent V pacing at 80 bpm  GEN- The patient is well appearing, alert and oriented x 3 today.   Head- normocephalic, atraumatic Eyes-  Sclera clear, conjunctiva pink Ears- hearing intact Oropharynx- clear Neck- ecchymosis R neck Lungs- Coarse BS at R base, normal work of breathing Heart-  iRRR GI- soft, NT, ND, + BS Extremities- no clubbing, cyanosis, or edema Skin- no rash or lesion Psych- euthymic mood, full affect Neuro- strength and sensation are intact  LABS: Basic Metabolic Panel:  Basename 11/02/11 0534 10/31/11 0446  NA 135 140  K 3.8 3.3*  CL 102 104  CO2 23 30  GLUCOSE 86 89  BUN 20 23  CREATININE 0.96 1.12  CALCIUM 8.1* 8.0*  MG -- --  PHOS -- --   Liver Function Tests:  Basename 10/31/11 0446  AST 28  ALT 42  ALKPHOS 70  BILITOT 0.9  PROT 5.0*  ALBUMIN 2.1*   No results found for this basename: LIPASE:2,AMYLASE:2 in the last 72 hours CBC:  Basename 11/02/11 0534 10/31/11 0446  WBC 7.7 8.2    NEUTROABS -- --  HGB 12.7* 11.9*  HCT 37.1* 35.5*  MCV 84.7 87.2  PLT 129* 108*   ASSESSMENT AND PLAN:    Active Problems:  Atrial fibrillation  Right ventricular dysplasia  Sepsis  Community acquired pneumonia  Symptomatic bradycardia  Acute renal failure  Acute respiratory failure with hypoxia  Septic shock  1. Afib- rate mostly controlled I will increase metoprolol to 50mg  BID today Amiodarone has been restarted by primary team and pt is now back on pradaxa. I hope that he will convert to sinus as he clinically improves.  IF not, he may require elective cardioversion as an outpatient.  2. ARVD/ h/o VT No sustained VT Increase metoprolol to 50mg  BID This should be further uptitrated to 75mg  BID upon follow-up with my PA next week.  3. Bradycardia- resolved I will have his ICD reprogrammed this am to DDD with a lower rate at 60 bpm.  4. Pneumonia/ sepsis- per primary team  He should follow-up with a [facility] cardiology PA in 1 week post discharge.  I will arrange. If he remains in afib at that time, then he should have cardioversion  in 3 weeks.    Hillis Range, MD 11/02/2011 8:01 AM

## 2011-11-02 NOTE — Progress Notes (Signed)
Went over all discharge instructions with patient including follow up visits, home meds and prescriptions.

## 2011-11-02 NOTE — Progress Notes (Signed)
Pt alarmed 9 beat run of VTach. In to assess pt and noted sleeping at the time. Denies chest pain or discomfort. Vitals assessed and noted 98.0t, 163/91bp, 82p, 16r, 93% O2 sats. Pt concerned about being discharged today on current dose of Lopressor due to him previously being on Lopressor 75mg  BID. MD on call notified and no new orders, but meds would be addressed in the am.

## 2011-11-02 NOTE — Progress Notes (Signed)
HOME HEALTH AGENCIES SERVING [place]   Agencies that are Medicare-Certified and are affiliated with [facility] Agency  Telephone Number Address  [facility]   The [facility] has ownership interest in this company; however, you are under no obligation to use this agency. [phone] or  [phone] [address] [place], [place] [address]   Agencies that are Medicare-Certified and are not affiliated with [facility] Telephone Number Address  [facility] [phone] Fax [phone] [address] [place], [place]  [address]  [facility] [phone] or [phone] Fax [phone] [address] [place], [place] [address]  [facility] [phone] Fax [phone] [address] [place], [place] [address]  [facility] [phone] Fax [phone] [address] [place], [place]  [address]  [facility] [phone] Fax [phone] [address] Suite [place], [place] [address]  [facility] [phone] [address] [place], [place] [address]  [facility] [phone]  [address] [place], [place] [address]  [facility] [phone] or [phone] Fax [phone] [address] [place], [place]  [address]  [facility] [phone] Fax [phone] [address] [place], [place]  [address]  [facility]  [phone] Fax [phone] [address] [place], [place] [address]               Agencies that are not Medicare-Certified and are not affiliated with [facility] Agency Telephone Number Address  [facility] [place] [phone] or [phone] Fax [phone] [address] [place], [place]  [address]  [facility] [phone] Fax [phone] [address] [place], [place]  [address]  [facility]  [phone] Fax [phone] [address] [place], [place] [address]  [facility] [phone] Fax [phone] [address] [place], [place] [address]  [facility] [phone] or [phone] Fax [phone] [address] [place], [place]  [address]  [facility] [phone] or [phone] Fax [phone] [address], [address] [place], [place]  [address]  [facility] [phone] Fax [phone] [address] [place], [place]  [address]  [facility] [phone] [address] [place], [place]  [address]  [facility] [phone] Fax [phone] [address] [place], [place]  [address]  [facility] [phone] Fax [phone] [address] [place], [place]  [address]  [facility] [phone] Fax [phone] [address] [place], [place] [address]  [facility] [phone] Fax [phone] [address] [place], [place]  [address]   In to see patient regarding recommendations for home health PT and OT. Choice of agencies offered. [facility]  chosen. Appropriate referrals will be made.

## 2011-11-02 NOTE — Progress Notes (Signed)
ANTIBIOTIC CONSULT NOTE - FOLLOW UP  Pharmacy Consult for levaquin Indication: pneumonia  No Known Allergies  Patient Measurements: Height: 6\' 2"  (188 cm) Weight: 233 lb 11 oz (106 kg) IBW/kg (Calculated) : 82.2   Vital Signs: Temp: 97.8 F (36.6 C) (08/05 0922) Temp src: Oral (08/05 0922) BP: 136/89 mmHg (08/05 0922) Pulse Rate: 79  (08/05 0922) Intake/Output from previous day: 08/04 0701 - 08/05 0700 In: 840 [P.O.:840] Out: 600 [Urine:600] Intake/Output from this shift: Total I/O In: 240 [P.O.:240] Out: -   Labs:  Basename 11/02/11 0534 10/31/11 0446 10/30/11 1935  WBC 7.7 8.2 10.9*  HGB 12.7* 11.9* 12.5*  PLT 129* 108* 98*  LABCREA -- -- --  CREATININE 0.96 1.12 --   Estimated Creatinine Clearance: 98.2 ml/min (by C-G formula based on Cr of 0.96). No results found for this basename: VANCOTROUGH:2,VANCOPEAK:2,VANCORANDOM:2,GENTTROUGH:2,GENTPEAK:2,GENTRANDOM:2,TOBRATROUGH:2,TOBRAPEAK:2,TOBRARND:2,AMIKACINPEAK:2,AMIKACINTROU:2,AMIKACIN:2, in the last 72 hours   Microbiology: Recent Results (from the past 720 hour(s))  CULTURE, BLOOD (ROUTINE X 2)     Status: Normal   Collection Time   10/27/11  5:56 PM      Component Value Range Status Comment   Specimen Description BLOOD FEMORAL ARTERY RIGHT   Final    Special Requests BOTTLES DRAWN AEROBIC ONLY 10CC   Final    Culture  Setup Time 10/28/2011 00:49   Final    Culture     Final    Value: STAPHYLOCOCCUS SPECIES (COAGULASE NEGATIVE)     Note: THE SIGNIFICANCE OF ISOLATING THIS ORGANISM FROM A SINGLE SET OF BLOOD CULTURES WHEN MULTIPLE SETS ARE DRAWN IS UNCERTAIN. PLEASE NOTIFY THE MICROBIOLOGY DEPARTMENT WITHIN ONE WEEK IF SPECIATION AND SENSITIVITIES ARE REQUIRED.     Note: Gram Stain Report Called to,Read Back By and Verified With: CHRIS @ 1803 ON 10/28/11 BY GOLLD   Report Status 10/30/2011 FINAL   Final    Organism ID, Bacteria STAPHYLOCOCCUS SPECIES (COAGULASE NEGATIVE)   Final   MRSA PCR SCREENING     Status:  Normal   Collection Time   10/27/11 10:25 PM      Component Value Range Status Comment   MRSA by PCR NEGATIVE  NEGATIVE Final   CULTURE, BLOOD (ROUTINE X 2)     Status: Normal (Preliminary result)   Collection Time   10/27/11 11:50 PM      Component Value Range Status Comment   Specimen Description BLOOD LEFT ARM   Final    Special Requests BOTTLES DRAWN AEROBIC ONLY 10CC   Final    Culture  Setup Time 10/28/2011 03:52   Final    Culture     Final    Value:        BLOOD CULTURE RECEIVED NO GROWTH TO DATE CULTURE WILL BE HELD FOR 5 DAYS BEFORE ISSUING A FINAL NEGATIVE REPORT   Report Status PENDING   Incomplete   CULTURE, BLOOD (ROUTINE X 2)     Status: Normal (Preliminary result)   Collection Time   10/30/11  4:15 PM      Component Value Range Status Comment   Specimen Description BLOOD RIGHT ARM   Final    Special Requests BOTTLES DRAWN AEROBIC AND ANAEROBIC 10CC   Final    Culture  Setup Time 10/30/2011 20:45   Final    Culture     Final    Value:        BLOOD CULTURE RECEIVED NO GROWTH TO DATE CULTURE WILL BE HELD FOR 5 DAYS BEFORE ISSUING A FINAL NEGATIVE REPORT  Report Status PENDING   Incomplete   CULTURE, BLOOD (ROUTINE X 2)     Status: Normal (Preliminary result)   Collection Time   10/30/11  4:21 PM      Component Value Range Status Comment   Specimen Description BLOOD LEFT ARM   Final    Special Requests BOTTLES DRAWN AEROBIC AND ANAEROBIC 10CC   Final    Culture  Setup Time 10/30/2011 20:46   Final    Culture     Final    Value:        BLOOD CULTURE RECEIVED NO GROWTH TO DATE CULTURE WILL BE HELD FOR 5 DAYS BEFORE ISSUING A FINAL NEGATIVE REPORT   Report Status PENDING   Incomplete     Anti-infectives     Start     Dose/Rate Route Frequency Ordered Stop   11/02/11 1100   levofloxacin (LEVAQUIN) tablet 750 mg        750 mg Oral Daily 11/02/11 0952     11/02/11 0000   levofloxacin (LEVAQUIN) 750 MG tablet        750 mg Oral Daily 11/02/11 0856 11/10/11 2359   10/30/11  1800   vancomycin (VANCOCIN) IVPB 1000 mg/200 mL premix  Status:  Discontinued        1,000 mg 200 mL/hr over 60 Minutes Intravenous Every 12 hours 10/30/11 1704 10/31/11 1154   10/29/11 0800   levofloxacin (LEVAQUIN) IVPB 750 mg  Status:  Discontinued        750 mg 100 mL/hr over 90 Minutes Intravenous Every 24 hours 10/28/11 0751 11/02/11 0952   10/29/11 0800   vancomycin (VANCOCIN) IVPB 1000 mg/200 mL premix  Status:  Discontinued        1,000 mg 200 mL/hr over 60 Minutes Intravenous Every 12 hours 10/28/11 1910 10/30/11 1040   10/28/11 2000   vancomycin (VANCOCIN) 1,750 mg in sodium chloride 0.9 % 500 mL IVPB        1,750 mg 250 mL/hr over 120 Minutes Intravenous  Once 10/28/11 1910 10/28/11 2211   10/28/11 1900   cefTRIAXone (ROCEPHIN) 1 g in dextrose 5 % 50 mL IVPB  Status:  Discontinued        1 g 100 mL/hr over 30 Minutes Intravenous Every 24 hours 10/28/11 0552 10/28/11 1202   10/28/11 1900   cefTRIAXone (ROCEPHIN) 2 g in dextrose 5 % 50 mL IVPB  Status:  Discontinued        2 g 100 mL/hr over 30 Minutes Intravenous Every 24 hours 10/27/11 2220 10/28/11 0750   10/28/11 0552   levofloxacin (LEVAQUIN) IVPB 750 mg  Status:  Discontinued        750 mg 100 mL/hr over 90 Minutes Intravenous Every 24 hours 10/28/11 0552 10/28/11 0751   10/27/11 2230   levofloxacin (LEVAQUIN) IVPB 750 mg  Status:  Discontinued        750 mg 100 mL/hr over 90 Minutes Intravenous Every 24 hours 10/27/11 2220 10/28/11 0751   10/27/11 1845   cefTRIAXone (ROCEPHIN) injection 1 g        1 g Intramuscular  Once 10/27/11 1834 10/27/11 1902   10/27/11 1845   azithromycin (ZITHROMAX) 500 mg in dextrose 5 % 250 mL IVPB        500 mg 250 mL/hr over 60 Minutes Intravenous  Once 10/27/11 1834 10/27/11 2006          Assessment: 65 yom presented to the hosptial with sepsis likely due to pneumonia. He has  completed a course of vancomycin and he continues on levaquin day #7 of therapy. CNS reported in  blood cultures but likely a contaminant. All other cultures are negative to date. Pt is currently afebrile and WBC WNL  Plan:  1. Change levaquin to 750mg  PO daily 2. F/u LOT  3. Monitor renal function and clinical status  Linnette Panella, Drake Leach 11/02/2011,9:53 AM

## 2011-11-02 NOTE — Progress Notes (Signed)
Physical Therapy Treatment Patient Details Name: Robert Ochoa MRN: [id number] DOB: 06-11-1945 Today's Date: 11/02/2011 Time: 4098-1191 PT Time Calculation (min): 15 min  PT Assessment / Plan / Recommendation Comments on Treatment Session  Pt. making good gains in his mobility.  Believe he will be safer with use of RW for now, as he was slightly unsteady without device.  He is in full agreement and needs RW obtained for him.  He does feel exerted with walking 250 and compares this to walking 4 [place], which he does at home frequently.    Follow Up Recommendations  Home health PT;Supervision - Intermittent    Barriers to Discharge        Equipment Recommendations  Rolling walker with 5" wheels    Recommendations for Other Services    Frequency Min 3X/week   Plan Discharge plan remains appropriate    Precautions / Restrictions Precautions Precautions: Fall Restrictions Weight Bearing Restrictions: No   Pertinent Vitals/Pain Denies pain;  O2 sats on room air low 90's at rest and throughout session.  HR 95-90.    Mobility  Bed Mobility Bed Mobility: Not assessed Transfers Transfers: Sit to Stand;Stand to Sit Sit to Stand: 5: Supervision;With armrests;From chair/3-in-1 Stand to Sit: 5: Supervision;With upper extremity assist;To chair/3-in-1;With armrests Details for Transfer Assistance: cues for use of UEs Ambulation/Gait Ambulation/Gait Assistance: 5: Supervision Ambulation Distance (Feet): 250 Feet Assistive device: Rolling walker;None Ambulation/Gait Assistance Details: Pt. mobilized intiially with RW and without overt LOB.  During walk, attempted gait without device and pt. was slightly unsteady and crossing feet over.  Balance and stability improved with RW. Gait Pattern: Step-through pattern;Narrow base of support (narrow base without device, normal base of support with RW) Stairs: No    Exercises     PT Diagnosis:    PT Problem List:   PT Treatment Interventions:      PT Goals Acute Rehab PT Goals PT Goal: Sit to Stand - Progress: Progressing toward goal PT Goal: Stand to Sit - Progress: Progressing toward goal PT Goal: Ambulate - Progress: Progressing toward goal  Visit Information  Last PT Received On: 11/02/11 Assistance Needed: +1    Subjective Data  Subjective: I feel like I walked 4 [place]onna (pt. reports he routinely walks 4 [place]/day and that he feels the same exertion only walking 250 feet)   Cognition  Overall Cognitive Status: Appears within functional limits for tasks assessed/performed Arousal/Alertness: Awake/alert Orientation Level: Oriented X4 / Intact Behavior During Session: [facility] for tasks performed    Balance     End of Session PT - End of Session Equipment Utilized During Treatment: Gait belt Activity Tolerance: Patient limited by fatigue Patient left: in chair;with call bell/phone within reach Nurse Communication: Mobility status   GP     Ferman Hamming PT Weldon Picking PT Acute Rehab Services [phone] Bee[place] [phone]

## 2011-11-02 NOTE — Progress Notes (Signed)
Speech Language Pathology Dysphagia Treatment Patient Details Name: Robert Ochoa MRN: [id number] DOB: [birth year] Today's Date: 11/02/2011 Time: 1100-1115 SLP Time Calculation (min): 15 min  Assessment / Plan / Recommendation Clinical Impression  Treatment focused on diet tolerance assessment following diet advancement to regular solids and liquids. Patient without overt s/s of aspiration at bedside today with clinician provided po trials. Oral transit, timing of swallow initiation, and hyo-laryngeal elevation appear functional. Patient does endorse  intermittent difficulty swallowing characterized by coughing since extubated however suspect due to pharyngeal irritation due to self-extubation. He also reports some difficulty swallowing prior to admission suspicious for esophageal involvement characterized by "gagging" primarily with milky products as well as what he describes as "post nasal drip."  Given admission diagnosis of right sided PNA, would not r/o component of aspiration however patient's past medical history does not raise concern for pre-admission dysphagia. Overall, patient appears to be tolerating current diet. If PNAs (particularly right sided) become recurrent, patient may benefit from an objective swallowing evaluation to r/o aspiration component. Patient in agreement. Will continue to f/u briefly to ensure tolerance.     Diet Recommendation  Continue with Current Diet: Regular;Thin liquid    SLP Plan Continue with current plan of care   Pertinent Vitals/Pain n/a   Swallowing Goals  SLP Swallowing Goals Patient will consume recommended diet without observed clinical signs of aspiration with: Independent assistance Swallow Study Goal #1 - Progress: Progressing toward goal Patient will utilize recommended strategies during swallow to increase swallowing safety with: Independent assistance Swallow Study Goal #2 - Progress: Progressing toward goal Goal #3: Pt will consume trials of  thin liquids at bedside with SLP only without clinical signs of aspiration.  Swallow Study Goal #3 - Progress: Met  General Temperature Spikes Noted: No Respiratory Status: Room air Behavior/Cognition: Alert;Cooperative;Pleasant mood Oral Cavity - Dentition: Adequate natural dentition Patient Positioning: Upright in chair   Dysphagia Treatment Treatment focused on: Skilled observation of diet tolerance;Patient/family/caregiver education Treatment Methods/Modalities: Skilled observation Patient observed directly with PO's: Yes Type of PO's observed: Regular;Thin liquids Feeding: Able to feed self Liquids provided via: [employer] MA, CCC-SLP [phone]   [name] [name] 11/02/2011, 12:00 PM

## 2011-11-02 NOTE — Progress Notes (Signed)
Pt HR in the 130s when up to the bathroom, no pacer spikes noted at the time. When pt back in bed HR back to 80s. Pt denies any discomfort.

## 2011-11-03 LAB — CULTURE, BLOOD (ROUTINE X 2)

## 2011-11-05 LAB — CULTURE, BLOOD (ROUTINE X 2): Culture: NO GROWTH

## 2011-11-06 DIAGNOSIS — I4891 Unspecified atrial fibrillation: Secondary | ICD-10-CM | POA: Diagnosis not present

## 2011-11-06 DIAGNOSIS — J189 Pneumonia, unspecified organism: Secondary | ICD-10-CM | POA: Diagnosis not present

## 2011-11-06 DIAGNOSIS — Z95810 Presence of automatic (implantable) cardiac defibrillator: Secondary | ICD-10-CM | POA: Diagnosis not present

## 2011-11-06 DIAGNOSIS — M6281 Muscle weakness (generalized): Secondary | ICD-10-CM | POA: Diagnosis not present

## 2011-11-06 DIAGNOSIS — F411 Generalized anxiety disorder: Secondary | ICD-10-CM | POA: Diagnosis not present

## 2011-11-06 DIAGNOSIS — I509 Heart failure, unspecified: Secondary | ICD-10-CM | POA: Diagnosis not present

## 2011-11-09 ENCOUNTER — Ambulatory Visit (INDEPENDENT_AMBULATORY_CARE_PROVIDER_SITE_OTHER): Payer: Medicare Other | Admitting: Nurse Practitioner

## 2011-11-09 ENCOUNTER — Encounter: Payer: Self-pay | Admitting: Nurse Practitioner

## 2011-11-09 VITALS — BP 108/78 | HR 63 | Ht 74.0 in | Wt 208.0 lb

## 2011-11-09 DIAGNOSIS — I519 Heart disease, unspecified: Secondary | ICD-10-CM

## 2011-11-09 DIAGNOSIS — I4891 Unspecified atrial fibrillation: Secondary | ICD-10-CM | POA: Diagnosis not present

## 2011-11-09 DIAGNOSIS — I498 Other specified cardiac arrhythmias: Secondary | ICD-10-CM | POA: Diagnosis not present

## 2011-11-09 DIAGNOSIS — R001 Bradycardia, unspecified: Secondary | ICD-10-CM

## 2011-11-09 DIAGNOSIS — A419 Sepsis, unspecified organism: Secondary | ICD-10-CM

## 2011-11-09 LAB — HEPATIC FUNCTION PANEL
ALT: 19 U/L (ref 0–53)
AST: 21 U/L (ref 0–37)
Albumin: 3.2 g/dL — ABNORMAL LOW (ref 3.5–5.2)
Alkaline Phosphatase: 67 U/L (ref 39–117)
Bilirubin, Direct: 0.1 mg/dL (ref 0.0–0.3)
Total Bilirubin: 0.9 mg/dL (ref 0.3–1.2)
Total Protein: 7.1 g/dL (ref 6.0–8.3)

## 2011-11-09 LAB — CBC WITH DIFFERENTIAL/PLATELET
Basophils Absolute: 0 10*3/uL (ref 0.0–0.1)
Basophils Relative: 0.2 % (ref 0.0–3.0)
Eosinophils Absolute: 0.1 10*3/uL (ref 0.0–0.7)
Eosinophils Relative: 1.2 % (ref 0.0–5.0)
HCT: 44.5 % (ref 39.0–52.0)
Hemoglobin: 14.5 g/dL (ref 13.0–17.0)
Lymphocytes Relative: 9.3 % — ABNORMAL LOW (ref 12.0–46.0)
Lymphs Abs: 0.9 10*3/uL (ref 0.7–4.0)
MCHC: 32.5 g/dL (ref 30.0–36.0)
MCV: 87.7 fl (ref 78.0–100.0)
Monocytes Absolute: 1 10*3/uL (ref 0.1–1.0)
Monocytes Relative: 9.5 % (ref 3.0–12.0)
Neutro Abs: 8 10*3/uL — ABNORMAL HIGH (ref 1.4–7.7)
Neutrophils Relative %: 79.8 % — ABNORMAL HIGH (ref 43.0–77.0)
Platelets: 440 10*3/uL — ABNORMAL HIGH (ref 150.0–400.0)
RBC: 5.07 Mil/uL (ref 4.22–5.81)
RDW: 14.6 % (ref 11.5–14.6)
WBC: 10.1 10*3/uL (ref 4.5–10.5)

## 2011-11-09 LAB — BASIC METABOLIC PANEL
BUN: 23 mg/dL (ref 6–23)
CO2: 25 mEq/L (ref 19–32)
Calcium: 8.6 mg/dL (ref 8.4–10.5)
Chloride: 102 mEq/L (ref 96–112)
Creatinine, Ser: 1.3 mg/dL (ref 0.4–1.5)
GFR: 59.06 mL/min — ABNORMAL LOW (ref >60.00)
Glucose, Bld: 89 mg/dL (ref 70–99)
Potassium: 4.6 mEq/L (ref 3.5–5.1)
Sodium: 134 mEq/L — ABNORMAL LOW (ref 135–145)

## 2011-11-09 NOTE — Assessment & Plan Note (Signed)
He seems to be recovering nicely. We will recheck his labs today. He is seeing his PCP later this week. Will defer follow up CXR to her.

## 2011-11-09 NOTE — Assessment & Plan Note (Signed)
His ICD is in place. His pacing rate has been increased to 60. He is on metoprolol 75 mg BID.

## 2011-11-09 NOTE — Progress Notes (Signed)
Robert Ochoa Date of Birth: Sep 30, 1945 Medical Record #[id number]  History of Present Illness: Robert Ochoa is seen today for a post hospital visit. He is seen for Dr. Johney Frame. He has multiple medical issues which include recent pneumonia with associated sepsis, renal failure and respiratory failure. He self extubated himself. Had atrial fib as well as profound bradycardia. ICD/pacer was reprogrammed to a lower rate of 60. EF was noted to be lower during this admission with an EF of 35%. His other problems include right ventricular dysplasia, ICD implant, PAF and paroxysmal VT.   He comes in today. He is here alone. He has just been home for one week. He is slowly getting stronger. He is gradually getting back to his routine of his ADL's. He is not short of breath. He was quite edematous when he went home but says this is now resolved. Not coughing. Almost finished with his antibiotics. Has not seen his PCP yet. I suspect she will get a follow up CXR. With the bradycardia he exhibited, his amiodarone and BB were stopped. These have been restarted. He is now on 75 mg of Lopressor BID. He is back on his Pradaxa. There was concern for a neck hematoma. He is not bleeding. Does have one localized area over the right ribs of some soreness with coughing, burping, sneezing. No fever. No chest pain. No ICD shocks.   Current Outpatient Prescriptions on File Prior to Visit  Medication Sig Dispense Refill  . amiodarone (PACERONE) 200 MG tablet Take 1 tablet (200 mg total) by mouth daily.  90 tablet  1  . Calcium-Magnesium-Zinc (CAL-MAG-ZINC PO) Take 1 tablet by mouth daily.       . dabigatran (PRADAXA) 150 MG CAPS Take 1 capsule (150 mg total) by mouth every 12 (twelve) hours.  60 capsule  6  . escitalopram (LEXAPRO) 20 MG tablet Take 20 mg by mouth daily.      . fluticasone (FLONASE) 50 MCG/ACT nasal spray Place 2 sprays into the nose daily as needed. For congestion      . levofloxacin (LEVAQUIN) 750 MG tablet  Take 1 tablet (750 mg total) by mouth daily.  8 tablet  0  . metoprolol tartrate (LOPRESSOR) 25 MG tablet Take 3 tablets (75 mg total) by mouth 2 (two) times daily.  180 tablet  1  . Omega-3 Fatty Acids (FISH OIL) 1000 MG CAPS Take 3 capsules by mouth daily.       [name] saccharomyces boulardii (FLORASTOR) 250 MG capsule Take 1 capsule (250 mg total) by mouth 2 (two) times daily.  60 capsule  0  . vitamin C (ASCORBIC ACID) 500 MG tablet Take 1,000 mg by mouth daily.       [name] albuterol (PROVENTIL HFA;VENTOLIN HFA) 108 (90 BASE) MCG/ACT inhaler Inhale 4 puffs into the lungs every 6 (six) hours as needed for wheezing.  1 Inhaler  2  . guaiFENesin (MUCINEX) 600 MG 12 hr tablet Take 2 tablets (1,200 mg total) by mouth 2 (two) times daily.  20 tablet  0    No Known Allergies  Past Medical History  Diagnosis Date  . Ventricular tachycardia     ICD discharges 05/2011 for this & AF-RVR - started on amiodarone then.  . Arrhythmogenic right ventricular dysplasia     Hx of VT s/p dual-chamber St. Jude ICD. Cardiac MRI 2010 showed moderately dilated RV with hypokinesis, LV EF 47%. EF 50-55% 2010 but down to 45% 05/2011. Cath 01/2009 without significant CAD.  [name]  Atrial fibrillation or flutter     Paroxysmal. Started on Pradaxa 05/2011.  . Cardiac defibrillator in place   . Anxiety   . Septic shock 10-27-11    with associated respiratory/renal failure/PAF/bradycardia  . LV dysfunction     per echo 10/2011; EF 35%    Past Surgical History  Procedure Date  . Appendectomy   . Cardiac defibrillator placement 02/19/09     St,. Jude     History  Smoking status  . Never Smoker   Smokeless tobacco  . Not on file    History  Alcohol Use  . Yes    social    Family History  Problem Relation Age of Onset  . Heart attack Father     Review of Systems: The review of systems is per the HPI.  All other systems were reviewed and are negative.  Physical Exam: BP 108/78  Pulse 63  Ht 6\' 2"  (1.88 m)  Wt  208 lb (94.348 kg)  BMI 26.71 kg/m2 Patient is very pleasant and in no acute distress. Skin is warm and dry. Color is normal.  HEENT is unremarkable. Normocephalic/atraumatic. PERRL. Sclera are nonicteric. Neck is supple. No masses. No JVD. Lungs are a little coarse, especially on the right. Cardiac exam shows a regular rate and rhythm. ICD in the left upper chest noted. Abdomen is soft. Extremities are without edema. Gait and ROM are intact. No gross neurologic deficits noted.   LABORATORY DATA: Labs are pending.    Assessment / Plan:

## 2011-11-09 NOTE — Assessment & Plan Note (Signed)
EKG is checked today and shows A pacing. He is in sinus. Will not need cardioversion at this time. Remains on Amiodarone and Pradaxa.

## 2011-11-09 NOTE — Patient Instructions (Addendum)
We need to check labs today  Stay on your current medicines  See Dr. Johney Frame as planned in September  Weigh daily and call us if you have a weight gain of 3 pounds overnight.  Minimize your salt  You may gradually get back to your activities  Call the [facility] office at [phone] if you have any questions, problems or concerns.

## 2011-11-09 NOTE — Assessment & Plan Note (Signed)
Last EF was down to 35%. I suspect he will need a repeat study. May need to add ACE but will check renal function first. He is to see Dr. Johney Frame in September. I have left him on his current regimen for now. He is reminded about daily weights and salt restriction. Hopefully his EF will improve now that he is back in sinus and recovering from his septic shock. His EF back in May was 50 to 55%. Patient is agreeable to this plan and will call if any problems develop in the interim.

## 2011-11-10 ENCOUNTER — Telehealth: Payer: Self-pay | Admitting: *Deleted

## 2011-11-10 DIAGNOSIS — Z95810 Presence of automatic (implantable) cardiac defibrillator: Secondary | ICD-10-CM | POA: Diagnosis not present

## 2011-11-10 DIAGNOSIS — J189 Pneumonia, unspecified organism: Secondary | ICD-10-CM | POA: Diagnosis not present

## 2011-11-10 DIAGNOSIS — M6281 Muscle weakness (generalized): Secondary | ICD-10-CM | POA: Diagnosis not present

## 2011-11-10 DIAGNOSIS — I509 Heart failure, unspecified: Secondary | ICD-10-CM | POA: Diagnosis not present

## 2011-11-10 DIAGNOSIS — I4891 Unspecified atrial fibrillation: Secondary | ICD-10-CM | POA: Diagnosis not present

## 2011-11-10 DIAGNOSIS — F411 Generalized anxiety disorder: Secondary | ICD-10-CM | POA: Diagnosis not present

## 2011-11-10 NOTE — Telephone Encounter (Signed)
Pt informed.  Labs sent to Dr. Paulino Rily. Vista Mink, CMA

## 2011-11-10 NOTE — Telephone Encounter (Signed)
Message copied by Awilda Bill on Tue Nov 10, 2011  4:28 PM ------      Message from: Rosalio Macadamia      Created: Tue Nov 10, 2011  7:39 AM       Ok to report. Labs are satisfactory. Kidneys have recovered. Liver tests ok. Platelets are back up. Please send copy to his PCP.

## 2011-11-13 DIAGNOSIS — I2789 Other specified pulmonary heart diseases: Secondary | ICD-10-CM | POA: Diagnosis not present

## 2011-11-13 DIAGNOSIS — J189 Pneumonia, unspecified organism: Secondary | ICD-10-CM | POA: Diagnosis not present

## 2011-11-13 DIAGNOSIS — I4891 Unspecified atrial fibrillation: Secondary | ICD-10-CM | POA: Diagnosis not present

## 2011-11-13 DIAGNOSIS — IMO0002 Reserved for concepts with insufficient information to code with codable children: Secondary | ICD-10-CM | POA: Diagnosis not present

## 2011-11-13 DIAGNOSIS — N179 Acute kidney failure, unspecified: Secondary | ICD-10-CM | POA: Diagnosis not present

## 2011-11-18 ENCOUNTER — Other Ambulatory Visit: Payer: Self-pay | Admitting: Family Medicine

## 2011-11-18 ENCOUNTER — Ambulatory Visit
Admission: RE | Admit: 2011-11-18 | Discharge: 2011-11-18 | Disposition: A | Discharge status: Home / Self Care | Payer: Medicare Other | Source: Ambulatory Visit | Attending: Family Medicine | Admitting: Family Medicine

## 2011-11-18 DIAGNOSIS — J189 Pneumonia, unspecified organism: Secondary | ICD-10-CM | POA: Diagnosis not present

## 2011-11-18 DIAGNOSIS — J9819 Other pulmonary collapse: Secondary | ICD-10-CM | POA: Diagnosis not present

## 2011-11-18 DIAGNOSIS — R05 Cough: Secondary | ICD-10-CM | POA: Diagnosis not present

## 2011-11-19 DIAGNOSIS — Z95810 Presence of automatic (implantable) cardiac defibrillator: Secondary | ICD-10-CM | POA: Diagnosis not present

## 2011-11-19 DIAGNOSIS — J189 Pneumonia, unspecified organism: Secondary | ICD-10-CM | POA: Diagnosis not present

## 2011-11-19 DIAGNOSIS — I509 Heart failure, unspecified: Secondary | ICD-10-CM | POA: Diagnosis not present

## 2011-11-19 DIAGNOSIS — M6281 Muscle weakness (generalized): Secondary | ICD-10-CM | POA: Diagnosis not present

## 2011-11-19 DIAGNOSIS — I4891 Unspecified atrial fibrillation: Secondary | ICD-10-CM | POA: Diagnosis not present

## 2011-11-19 DIAGNOSIS — F411 Generalized anxiety disorder: Secondary | ICD-10-CM | POA: Diagnosis not present

## 2011-11-23 DIAGNOSIS — N400 Enlarged prostate without lower urinary tract symptoms: Secondary | ICD-10-CM | POA: Diagnosis not present

## 2011-11-24 ENCOUNTER — Institutional Professional Consult (permissible substitution): Payer: Medicare Other | Admitting: Pulmonary Disease

## 2011-11-27 DIAGNOSIS — N400 Enlarged prostate without lower urinary tract symptoms: Secondary | ICD-10-CM | POA: Diagnosis not present

## 2011-11-27 DIAGNOSIS — R972 Elevated prostate specific antigen [PSA]: Secondary | ICD-10-CM | POA: Diagnosis not present

## 2011-12-14 ENCOUNTER — Encounter: Payer: Self-pay | Admitting: *Deleted

## 2011-12-14 DIAGNOSIS — Z95810 Presence of automatic (implantable) cardiac defibrillator: Secondary | ICD-10-CM | POA: Insufficient documentation

## 2011-12-21 ENCOUNTER — Ambulatory Visit (INDEPENDENT_AMBULATORY_CARE_PROVIDER_SITE_OTHER): Payer: Medicare Other | Admitting: Pulmonary Disease

## 2011-12-21 ENCOUNTER — Encounter: Payer: Self-pay | Admitting: Pulmonary Disease

## 2011-12-21 VITALS — BP 114/78 | HR 60 | Temp 98.0°F | Ht 74.0 in | Wt 216.8 lb

## 2011-12-21 DIAGNOSIS — J189 Pneumonia, unspecified organism: Secondary | ICD-10-CM | POA: Diagnosis not present

## 2011-12-21 DIAGNOSIS — Q249 Congenital malformation of heart, unspecified: Secondary | ICD-10-CM | POA: Diagnosis not present

## 2011-12-21 DIAGNOSIS — Z23 Encounter for immunization: Secondary | ICD-10-CM | POA: Diagnosis not present

## 2011-12-21 NOTE — Patient Instructions (Signed)
You will need a repeat echo to follow up on pump function & pulmonary pressure Flu shot

## 2011-12-21 NOTE — Progress Notes (Signed)
Subjective:    Patient ID: Robert Ochoa, male    DOB: 1945/12/28, 66 y.o.   MRN: [id number]  HPI 66 year old never smoker with a history of ARVD (Arrhythmogenic right ventricular dysplasia)  s/p dual-chamber St. Jude ICD adm 8/13 with sepsis likely due to RML pneumonia (CAP). He had bradycardia in setting of sepsis, then developed shock, ARDS  Bubble study was positive for right to left shunt but somewhat late indicating possible pulmonary origin EF 35%, dilated RV He has since fully recovered On pradaxa & amio since 2/13 for atrial fibn - PFts nml 2/13  He is referred for evaluation of pulmonary hypertension CXR 11/18/11 shows resolved consolidation with linear atelectasis in RML.  Past Medical History  Diagnosis Date  . Ventricular tachycardia     ICD discharges 05/2011 for this & AF-RVR - started on amiodarone then.  . Arrhythmogenic right ventricular dysplasia     Hx of VT s/p dual-chamber St. Jude ICD. Cardiac MRI 2010 showed moderately dilated RV with hypokinesis, LV EF 47%. EF 50-55% 2010 but down to 45% 05/2011. Cath 01/2009 without significant CAD.  [name] Atrial fibrillation or flutter     Paroxysmal. Started on Pradaxa 05/2011.  . Cardiac defibrillator in place   . Anxiety   . Septic shock 10-27-11    with associated respiratory/renal failure/PAF/bradycardia  . LV dysfunction     per echo 10/2011; EF 35%    Past Surgical History  Procedure Date  . Appendectomy   . Cardiac defibrillator placement 02/19/09     St,. Jude     No Known Allergies History   Social History  . Marital Status: Divorced    Spouse Name: N/A    Number of Children: 1  . 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 Smoking status: Never Smoker   . Smokeless tobacco: Never Used  . Alcohol Use: Yes     social--3 beers per week  . Drug Use: No  . Sexually Active: Yes    Birth Control/ Protection: Coitus interruptus   Other Topics  Concern  . Not on file   Social History Narrative   Divorced w 1 child. Self employed.          Review of Systems  Constitutional: Negative for fever, chills, diaphoresis, activity change, appetite change, fatigue and unexpected weight change.  HENT: Positive for congestion, rhinorrhea and postnasal drip. Negative for hearing loss, ear pain, nosebleeds, sore throat, facial swelling, sneezing, mouth sores, trouble swallowing, neck pain, neck stiffness, dental problem, voice change, sinus pressure, tinnitus and ear discharge.   Eyes: Negative for visual disturbance.  Respiratory: Negative for cough, choking, chest tightness, shortness of breath and wheezing.   Cardiovascular: Positive for palpitations. Negative for chest pain and leg swelling.  Gastrointestinal: Negative for nausea, vomiting, abdominal pain, constipation and blood in stool.  Genitourinary: Negative for difficulty urinating.  Musculoskeletal: Negative for myalgias, back pain, joint swelling, arthralgias and gait problem.  Skin: Negative for color change, pallor and rash.  Neurological: Negative for dizziness, tremors, seizures, syncope, speech difficulty, weakness, light-headedness, numbness and headaches.  Hematological: Does not bruise/bleed easily.  Psychiatric/Behavioral: Negative for confusion, disturbed wake/sleep cycle and agitation. The patient is not nervous/anxious.        Objective:   Physical Exam  Gen. Pleasant, well-nourished, in no distress, normal affect ENT - no lesions, no post nasal drip Neck: No JVD, no  thyromegaly, no carotid bruits Lungs: no use of accessory muscles, no dullness to percussion, clear without rales or rhonchi  Cardiovascular: Rhythm regular, heart sounds  normal, no murmurs or gallops, no peripheral edema Abdomen: soft and non-tender, no hepatosplenomegaly, BS normal. Musculoskeletal: No deformities, no cyanosis or clubbing Neuro:  alert, non focal        Assessment &  Plan:

## 2011-12-21 NOTE — Assessment & Plan Note (Signed)
S/p ICD for VT While his ICU illness was attributed to pneumonia with sepsis, time course of worsening & subsequent improvement was very rapid & hence somewhat atypical for sepsis. Feel rt to left shunt played a role in his hypoxia quite out of proportion to infiltrates. Clearly pulmonary hypertension & RV dilation in the setting of acute resp failure &mechanical ventilation is difficult to interpret. Will need rpt echo to evaluate LV fn (note drop in EF to 35% due to presumed sepsis) & can also quantitate RV pressures at the same time. Will defer to cardiology to order & follow

## 2011-12-25 ENCOUNTER — Encounter: Payer: Self-pay | Admitting: Internal Medicine

## 2011-12-25 ENCOUNTER — Ambulatory Visit (INDEPENDENT_AMBULATORY_CARE_PROVIDER_SITE_OTHER): Payer: Medicare Other | Admitting: Internal Medicine

## 2011-12-25 VITALS — BP 140/92 | HR 60 | Ht 74.0 in | Wt 216.0 lb

## 2011-12-25 DIAGNOSIS — Q208 Other congenital malformations of cardiac chambers and connections: Secondary | ICD-10-CM

## 2011-12-25 DIAGNOSIS — I4891 Unspecified atrial fibrillation: Secondary | ICD-10-CM | POA: Diagnosis not present

## 2011-12-25 DIAGNOSIS — I472 Ventricular tachycardia, unspecified: Secondary | ICD-10-CM

## 2011-12-25 DIAGNOSIS — Z95810 Presence of automatic (implantable) cardiac defibrillator: Secondary | ICD-10-CM

## 2011-12-25 DIAGNOSIS — I4729 Other ventricular tachycardia: Secondary | ICD-10-CM

## 2011-12-25 DIAGNOSIS — Q249 Congenital malformation of heart, unspecified: Secondary | ICD-10-CM | POA: Diagnosis not present

## 2011-12-25 NOTE — Patient Instructions (Addendum)
Your physician recommends that you schedule a follow-up appointment in: 3 months with Sunday Spillers  Your physician wants you to follow-up in: 6 months with Dr Johney Frame.  You will receive a reminder letter in the mail two months in advance. If you don't receive a letter, please call our office to schedule the follow-up appointment.

## 2011-12-28 NOTE — Assessment & Plan Note (Signed)
Repeat echo to evaluate pulmonary pressures and LV function which were both abnormal during recent illness If EF remains depressed, we will need to treat aggressively

## 2011-12-28 NOTE — Assessment & Plan Note (Signed)
Stable Continue pradaxa for stroke prevention

## 2011-12-28 NOTE — Progress Notes (Signed)
PCP:  Emeterio Reeve, MD  The patient presents today for routine electrophysiology followup.  He continues to make slow improvement since his hospitalization for sepsis/ respiratory failure/ pneumonia.   He is unaware of any arrhythmias.  His exercise tolerance is much improved.  Today, he denies symptoms of palpitations, chest pain, shortness of breath, orthopnea, PND, lower extremity edema, dizziness, presyncope, syncope, or neurologic sequela.  The patient feels that he is tolerating medications without difficulties and is otherwise without complaint today.   Past Medical History  Diagnosis Date  . Ventricular tachycardia     ICD discharges 05/2011 for this & AF-RVR - started on amiodarone then.  . Arrhythmogenic right ventricular dysplasia     Hx of VT s/p dual-chamber St. Jude ICD. Cardiac MRI 2010 showed moderately dilated RV with hypokinesis, LV EF 47%. EF 50-55% 2010 but down to 45% 05/2011. Cath 01/2009 without significant CAD.  [name] Atrial fibrillation or flutter     Paroxysmal. Started on Pradaxa 05/2011.  . Cardiac defibrillator in place   . Anxiety   . Septic shock 10-27-11    with associated respiratory/renal failure/PAF/bradycardia  . LV dysfunction     per echo 10/2011; EF 35%   Past Surgical History  Procedure Date  . Appendectomy   . Cardiac defibrillator placement 02/19/09     St,. Jude     Current Outpatient Prescriptions  Medication Sig Dispense Refill  . amiodarone (PACERONE) 200 MG tablet Take 1 tablet (200 mg total) by mouth daily.  90 tablet  1  . Calcium-Magnesium-Zinc (CAL-MAG-ZINC PO) Take 1 tablet by mouth daily.       . dabigatran (PRADAXA) 150 MG CAPS Take 1 capsule (150 mg total) by mouth every 12 (twelve) hours.  60 capsule  6  . escitalopram (LEXAPRO) 20 MG tablet Take 20 mg by mouth daily.      . fluticasone (FLONASE) 50 MCG/ACT nasal spray Place 2 sprays into the nose daily as needed. For congestion      . metoprolol tartrate (LOPRESSOR) 25 MG tablet  Take 3 tablets (75 mg total) by mouth 2 (two) times daily.  180 tablet  1  . Omega-3 Fatty Acids (FISH OIL) 1000 MG CAPS Take 3 capsules by mouth daily.       . vitamin C (ASCORBIC ACID) 500 MG tablet Take 1,000 mg by mouth daily.         No Known Allergies  History   Social History  . Marital Status: Divorced    Spouse Name: N/A    Number of Children: 1  . 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 Smoking status: Never Smoker   . Smokeless tobacco: Never Used  . Alcohol Use: Yes     social--3 beers per week  . Drug Use: No  . Sexually Active: Yes    Birth Control/ Protection: Coitus interruptus   Other Topics Concern  . Not on file   Social History Narrative   Divorced w 1 child. Self employed.     Family History  Problem Relation Age of Onset  . Heart attack Father   . Allergies Mother     Physical Exam: Filed Vitals:   12/25/11 1116  BP: 140/92  Pulse: 60  Height: 6\' 2"  (1.88 m)  Weight: 216 lb (97.977 kg)  SpO2: 99%    GEN- The patient is well appearing, alert and oriented x  3 today.   Head- normocephalic, atraumatic Eyes-  Sclera clear, conjunctiva pink Ears- hearing intact Oropharynx- clear Neck- supple, no JVP Lymph- no cervical lymphadenopathy Lungs- Clear to ausculation bilaterally, normal work of breathing Chest- ICD pocket is well healed Heart- Regular rate and rhythm, no murmurs, rubs or gallops, PMI not laterally displaced GI- soft, NT, ND, + BS Extremities- no clubbing, cyanosis, or edema MS- no significant deformity or atrophy Skin- no rash or lesion Psych- anxious mood, full affect Neuro- strength and sensation are intact  ICD interrogation- reviewed in detail today,  See PACEART report  Assessment and Plan:

## 2011-12-28 NOTE — Assessment & Plan Note (Signed)
Normal ICD function See Arita Miss Art report No changes today  Continue amiodarone at this time, consider following PFTs going forward

## 2011-12-30 ENCOUNTER — Other Ambulatory Visit (HOSPITAL_COMMUNITY): Payer: Medicare Other

## 2011-12-30 LAB — ICD DEVICE OBSERVATION
AL IMPEDENCE ICD: 430 Ohm
AL THRESHOLD: 1 V
BAMS-0001: 170 {beats}/min
RV LEAD AMPLITUDE: 7.4 mv
RV LEAD THRESHOLD: 0.75 V
TZAT-0004SLOWVT: 8
TZAT-0012SLOWVT: 200 ms
TZAT-0013SLOWVT: 3
TZAT-0018SLOWVT: NEGATIVE
TZON-0003SLOWVT: 350 ms
TZST-0001SLOWVT: 3
TZST-0003SLOWVT: 40 J
VENTRICULAR PACING ICD: 5.5 pct

## 2011-12-31 ENCOUNTER — Telehealth: Payer: Self-pay | Admitting: Internal Medicine

## 2011-12-31 ENCOUNTER — Ambulatory Visit (HOSPITAL_COMMUNITY): Payer: Medicare Other | Attending: Cardiology | Admitting: Radiology

## 2011-12-31 DIAGNOSIS — I379 Nonrheumatic pulmonary valve disorder, unspecified: Secondary | ICD-10-CM | POA: Diagnosis not present

## 2011-12-31 DIAGNOSIS — Z95810 Presence of automatic (implantable) cardiac defibrillator: Secondary | ICD-10-CM

## 2011-12-31 DIAGNOSIS — I369 Nonrheumatic tricuspid valve disorder, unspecified: Secondary | ICD-10-CM | POA: Diagnosis not present

## 2011-12-31 DIAGNOSIS — I4891 Unspecified atrial fibrillation: Secondary | ICD-10-CM | POA: Diagnosis not present

## 2011-12-31 DIAGNOSIS — I472 Ventricular tachycardia, unspecified: Secondary | ICD-10-CM

## 2011-12-31 MED ORDER — DABIGATRAN ETEXILATE MESYLATE 150 MG PO CAPS: 150.0000 mg | ORAL_CAPSULE | Freq: Two times a day (BID) | ORAL | Status: DC

## 2011-12-31 NOTE — Progress Notes (Signed)
Echocardiogram performed.  

## 2011-12-31 NOTE — Telephone Encounter (Signed)
New Problem:    Patient called in needing a refill of his dabigatran (PRADAXA) 150 MG CAPS.

## 2012-01-01 ENCOUNTER — Other Ambulatory Visit: Payer: Self-pay

## 2012-01-04 ENCOUNTER — Other Ambulatory Visit: Payer: Self-pay | Admitting: *Deleted

## 2012-01-04 ENCOUNTER — Telehealth: Payer: Self-pay | Admitting: Internal Medicine

## 2012-01-04 MED ORDER — AMIODARONE HCL 200 MG PO TABS: 200.0000 mg | ORAL_TABLET | Freq: Every day | ORAL | Status: DC

## 2012-01-04 NOTE — Telephone Encounter (Signed)
Sent in order for his Amiodarone to pharmacy

## 2012-01-04 NOTE — Telephone Encounter (Signed)
Fax Received. Refill Completed. Fumiko Cham Chowoe (R.M.A)   

## 2012-01-04 NOTE — Telephone Encounter (Signed)
plz return call to patient regarding questions about medication. [phone]

## 2012-01-08 DIAGNOSIS — R972 Elevated prostate specific antigen [PSA]: Secondary | ICD-10-CM | POA: Diagnosis not present

## 2012-01-19 NOTE — Addendum Note (Signed)
Addended by: Marrion Coy L on: 01/19/2012 02:48 PM   Modules accepted: Orders

## 2012-03-07 DIAGNOSIS — L723 Sebaceous cyst: Secondary | ICD-10-CM | POA: Diagnosis not present

## 2012-03-07 DIAGNOSIS — L821 Other seborrheic keratosis: Secondary | ICD-10-CM | POA: Diagnosis not present

## 2012-03-07 DIAGNOSIS — Z85828 Personal history of other malignant neoplasm of skin: Secondary | ICD-10-CM | POA: Diagnosis not present

## 2012-03-07 DIAGNOSIS — D1801 Hemangioma of skin and subcutaneous tissue: Secondary | ICD-10-CM | POA: Diagnosis not present

## 2012-03-07 DIAGNOSIS — B351 Tinea unguium: Secondary | ICD-10-CM | POA: Diagnosis not present

## 2012-03-07 DIAGNOSIS — L819 Disorder of pigmentation, unspecified: Secondary | ICD-10-CM | POA: Diagnosis not present

## 2012-03-07 DIAGNOSIS — L570 Actinic keratosis: Secondary | ICD-10-CM | POA: Diagnosis not present

## 2012-03-28 ENCOUNTER — Encounter: Payer: Self-pay | Admitting: Internal Medicine

## 2012-03-28 ENCOUNTER — Ambulatory Visit (INDEPENDENT_AMBULATORY_CARE_PROVIDER_SITE_OTHER): Payer: Medicare Other | Admitting: Nurse Practitioner

## 2012-03-28 ENCOUNTER — Ambulatory Visit (INDEPENDENT_AMBULATORY_CARE_PROVIDER_SITE_OTHER): Payer: Medicare Other | Admitting: *Deleted

## 2012-03-28 ENCOUNTER — Encounter: Payer: Self-pay | Admitting: Nurse Practitioner

## 2012-03-28 VITALS — BP 128/86 | HR 60 | Ht 74.0 in | Wt 225.0 lb

## 2012-03-28 DIAGNOSIS — I4891 Unspecified atrial fibrillation: Secondary | ICD-10-CM | POA: Diagnosis not present

## 2012-03-28 DIAGNOSIS — Z79899 Other long term (current) drug therapy: Secondary | ICD-10-CM

## 2012-03-28 DIAGNOSIS — I472 Ventricular tachycardia: Secondary | ICD-10-CM | POA: Diagnosis not present

## 2012-03-28 DIAGNOSIS — I428 Other cardiomyopathies: Secondary | ICD-10-CM

## 2012-03-28 DIAGNOSIS — R0989 Other specified symptoms and signs involving the circulatory and respiratory systems: Secondary | ICD-10-CM

## 2012-03-28 DIAGNOSIS — I509 Heart failure, unspecified: Secondary | ICD-10-CM

## 2012-03-28 LAB — ICD DEVICE OBSERVATION
AL THRESHOLD: 1 V
ATRIAL PACING ICD: 98 pct
DEVICE MODEL ICD: 746606
FVT: 0
HV IMPEDENCE: 50 Ohm
MODE SWITCH EPISODES: 0
PACEART VT: 0
RV LEAD AMPLITUDE: 6.7 mv
RV LEAD IMPEDENCE ICD: 400 Ohm
RV LEAD THRESHOLD: 1 V
TOT-0006: 20101123000000
TOT-0009: 2
TOT-0010: 68
TZAT-0001SLOWVT: 1
TZAT-0020SLOWVT: 1 ms
TZON-0005SLOWVT: 6
TZST-0001SLOWVT: 2
TZST-0001SLOWVT: 4
TZST-0003SLOWVT: 30 J

## 2012-03-28 LAB — BASIC METABOLIC PANEL
BUN: 15 mg/dL (ref 6–23)
CO2: 26 mEq/L (ref 19–32)
Calcium: 8.5 mg/dL (ref 8.4–10.5)
Chloride: 105 mEq/L (ref 96–112)
Creatinine, Ser: 1.2 mg/dL (ref 0.4–1.5)
GFR: 67.35 mL/min (ref >60.00)
Glucose, Bld: 96 mg/dL (ref 70–99)
Potassium: 4.2 mEq/L (ref 3.5–5.1)
Sodium: 139 mEq/L (ref 135–145)

## 2012-03-28 LAB — HEPATIC FUNCTION PANEL
ALT: 32 U/L (ref 0–53)
AST: 30 U/L (ref 0–37)
Albumin: 3.7 g/dL (ref 3.5–5.2)
Alkaline Phosphatase: 56 U/L (ref 39–117)
Bilirubin, Direct: 0 mg/dL (ref 0.0–0.3)
Total Bilirubin: 0.8 mg/dL (ref 0.3–1.2)
Total Protein: 7.1 g/dL (ref 6.0–8.3)

## 2012-03-28 LAB — TSH: TSH: 0.61 u[IU]/mL (ref 0.35–5.50)

## 2012-03-28 MED ORDER — LISINOPRIL 5 MG PO TABS: 5.0000 mg | ORAL_TABLET | Freq: Every day | ORAL | Status: DC

## 2012-03-28 NOTE — Progress Notes (Signed)
Robert Ochoa Date of Birth: 04/01/1945 Medical Record #[id number]  History of Present Illness: Robert Ochoa is seen today for a 3 month check. He is seen for Dr. Johney Frame. He has multiple medical issues which include past pneumonia with associated sepsis, renal failure and respiratory failure back in the summer. Has atrial fib. Has an ICD/pacer in place with a lower rate of 60. Last EF was 35 to 40% back in October. Managed medically.  His other problems include right ventricular dysplasia, ICD implant, PAF and paroxysmal VT.   Last seen at the end of September and was felt to be doing ok. Follow up echo after his severe illness from the summer showed his EF to still be reduced. Not on ACE.   He comes in today. He is here alone. Needs his device checked. He is doing quite well. Feels good. No symptoms. Not short of breath. Not lightheaded or dizzy. No chest pain. Tolerating his medicines. No atrial fib that he is aware of.    Current Outpatient Prescriptions on File Prior to Visit  Medication Sig Dispense Refill  . amiodarone (PACERONE) 200 MG tablet Take 1 tablet (200 mg total) by mouth daily.  90 tablet  3  . Calcium-Magnesium-Zinc (CAL-MAG-ZINC PO) Take 1 tablet by mouth daily.       . dabigatran (PRADAXA) 150 MG CAPS Take 1 capsule (150 mg total) by mouth every 12 (twelve) hours.  60 capsule  6  . escitalopram (LEXAPRO) 20 MG tablet Take 20 mg by mouth daily.      . fluticasone (FLONASE) 50 MCG/ACT nasal spray Place 2 sprays into the nose daily as needed. For congestion      . metoprolol tartrate (LOPRESSOR) 25 MG tablet Take 3 tablets (75 mg total) by mouth 2 (two) times daily.  180 tablet  1  . Omega-3 Fatty Acids (FISH OIL) 1000 MG CAPS Take 3 capsules by mouth daily.       . vitamin C (ASCORBIC ACID) 500 MG tablet Take 1,000 mg by mouth daily.       [name] lisinopril (PRINIVIL,ZESTRIL) 5 MG tablet Take 1 tablet (5 mg total) by mouth daily.  30 tablet  3    No Known Allergies  Past Medical  History  Diagnosis Date  . Ventricular tachycardia     ICD discharges 05/2011 for this & AF-RVR - started on amiodarone then.  . Arrhythmogenic right ventricular dysplasia     Hx of VT s/p dual-chamber St. Jude ICD. Cardiac MRI 2010 showed moderately dilated RV with hypokinesis, LV EF 47%. EF 50-55% 2010 but down to 45% 05/2011. Cath 01/2009 without significant CAD.  [name] Atrial fibrillation or flutter     Paroxysmal. Started on Pradaxa 05/2011.  . Cardiac defibrillator in place   . Anxiety   . Septic shock(785.52) 10-27-11    with associated respiratory/renal failure/PAF/bradycardia  . LV dysfunction     per echo 10/2011; EF 35%    Past Surgical History  Procedure Date  . Appendectomy   . Cardiac defibrillator placement 02/19/09     St,. Jude     History  Smoking status  . Never Smoker   Smokeless tobacco  . Never Used    History  Alcohol Use  . Yes    Comment: social--3 beers per week    Family History  Problem Relation Age of Onset  . Heart attack Father   . Allergies Mother     Review of Systems: The review of systems is  per the HPI.  All other systems were reviewed and are negative.  Physical Exam: BP 128/86  Pulse 60  Ht 6\' 2"  (1.88 m)  Wt 225 lb (102.059 kg)  BMI 28.89 kg/m2 Patient is very pleasant and in no acute distress. Skin is warm and dry. Color is normal.  HEENT is unremarkable. Normocephalic/atraumatic. PERRL. Sclera are nonicteric. Neck is supple. No masses. No JVD. Lungs are clear. Cardiac exam shows a regular rate and rhythm. Abdomen is soft. Extremities are without edema. Gait and ROM are intact. No gross neurologic deficits noted.  LABORATORY DATA:  ICD is checked and is functioning satisfactorily. No episodes.   Echo Study Conclusions from October 2013  - Left ventricle: The cavity size was normal. Wall thickness was normal. Systolic function was moderately reduced. The estimated ejection fraction was in the range of 35% to 40%. Diffuse  hypokinesis. Doppler parameters are consistent with abnormal left ventricular relaxation (grade 1 diastolic dysfunction). - Aortic valve: There was no stenosis. - Aorta: Borderline dilated aortic root. Aortic root dimension:50mm (ED). - Mitral valve: Mildly calcified annulus. No significant regurgitation. - Left atrium: The atrium was mildly dilated. - Right ventricle: The cavity size was mildly to moderately dilated. Pacer wire or catheter noted in right ventricle. Systolic function was mildly reduced. - Right atrium: The atrium was mildly dilated. - Tricuspid valve: Peak RV-RA gradient: 23mm Hg (S). - Pulmonary arteries: PA peak pressure: 28mm Hg (S). - Inferior vena cava: The vessel was normal in size; the respirophasic diameter changes were in the normal range (= 50%); findings are consistent with normal central venous pressure.  Lab Results  Component Value Date   WBC 10.1 11/09/2011   HGB 14.5 11/09/2011   HCT 44.5 11/09/2011   PLT 440.0* 11/09/2011   GLUCOSE 89 11/09/2011   CHOL  Value: 146        ATP III CLASSIFICATION:  <200     mg/dL   Desirable  981-191  mg/dL   Borderline High  >=478    mg/dL   High        29/56/2130   TRIG 48 02/15/2009   HDL 43 02/15/2009   LDLCALC  Value: 93        Total Cholesterol/HDL:CHD Risk Coronary Heart Disease Risk Table                     Men   Women  1/2 Average Risk   3.4   3.3  Average Risk       5.0   4.4  2 X Average Risk   9.6   7.1  3 X Average Risk  23.4   11.0        Use the calculated Patient Ratio above and the CHD Risk Table to determine the patient's CHD Risk.        ATP III CLASSIFICATION (LDL):  <100     mg/dL   Optimal  865-784  mg/dL   Near or Above                    Optimal  130-159  mg/dL   Borderline  696-295  mg/dL   High  >284     mg/dL   Very High 13/24/4010   ALT 19 11/09/2011   AST 21 11/09/2011   NA 134* 11/09/2011   K 4.6 11/09/2011   CL 102 11/09/2011   CREATININE 1.3 11/09/2011   BUN 23 11/09/2011   CO2 25 11/09/2011  TSH 0.807 06/25/2011   INR 1.18 10/30/2011    Assessment / Plan:  1. Nonischemic CM - EF is 35 to 40%. He looks to be NYHA class I. He is agreeable to starting low dose ACE. I have started Lisinopril 5 mg a day. See him back in a month.  2. ARVD - has his device in place  3. PAF - on amiodarone. Needs labs checked today. Device is checked today. Last PFT's in February. Consider repeating on return visit.  4. Chronic anticoagulation - he is on Pradaxa. He is worried about the cost when he hits the donut hole. CHADs looks to just be a one. No recurrent atrial fib noted. He will discuss with Dr. Johney Frame at his next visit with him.   Overall, he looks to be doing well. Will start low dose ACE. I will see him back in a month. Labs are checked today in follow up of his amiodarone.   Patient is agreeable to this plan and will call if any problems develop in the interim.

## 2012-03-28 NOTE — Patient Instructions (Signed)
We will add Lisinopril 5 mg each day - this is to help with the weakness of your heart pumping function  We will check labs today  We will see you back in a month  Call the [facility] office at [phone] if you have any questions, problems or concerns.

## 2012-03-28 NOTE — Progress Notes (Signed)
icd check in clinic  

## 2012-03-29 LAB — CBC WITH DIFFERENTIAL/PLATELET
Basophils Absolute: 0 10*3/uL (ref 0.0–0.1)
Basophils Relative: 0.2 % (ref 0.0–3.0)
Eosinophils Absolute: 0.1 10*3/uL (ref 0.0–0.7)
Eosinophils Relative: 2.1 % (ref 0.0–5.0)
HCT: 46.9 % (ref 39.0–52.0)
Hemoglobin: 15.7 g/dL (ref 13.0–17.0)
Lymphocytes Relative: 13.3 % (ref 12.0–46.0)
Lymphs Abs: 0.7 10*3/uL (ref 0.7–4.0)
MCHC: 33.4 g/dL (ref 30.0–36.0)
MCV: 86.7 fl (ref 78.0–100.0)
Monocytes Absolute: 1 10*3/uL (ref 0.1–1.0)
Monocytes Relative: 17.6 % — ABNORMAL HIGH (ref 3.0–12.0)
Neutro Abs: 3.7 10*3/uL (ref 1.4–7.7)
Neutrophils Relative %: 66.8 % (ref 43.0–77.0)
Platelets: 163 10*3/uL (ref 150.0–400.0)
RBC: 5.41 Mil/uL (ref 4.22–5.81)
RDW: 14 % (ref 11.5–14.6)
WBC: 5.6 10*3/uL (ref 4.5–10.5)

## 2012-04-21 DIAGNOSIS — H47329 Drusen of optic disc, unspecified eye: Secondary | ICD-10-CM | POA: Diagnosis not present

## 2012-04-25 ENCOUNTER — Encounter: Payer: Self-pay | Admitting: Nurse Practitioner

## 2012-04-25 ENCOUNTER — Ambulatory Visit (INDEPENDENT_AMBULATORY_CARE_PROVIDER_SITE_OTHER): Payer: Medicare Other | Admitting: Nurse Practitioner

## 2012-04-25 VITALS — BP 110/86 | HR 64 | Ht 74.0 in | Wt 224.8 lb

## 2012-04-25 DIAGNOSIS — Z79899 Other long term (current) drug therapy: Secondary | ICD-10-CM

## 2012-04-25 DIAGNOSIS — I428 Other cardiomyopathies: Secondary | ICD-10-CM | POA: Diagnosis not present

## 2012-04-25 LAB — BASIC METABOLIC PANEL
BUN: 20 mg/dL (ref 6–23)
CO2: 23 mEq/L (ref 19–32)
Calcium: 8.9 mg/dL (ref 8.4–10.5)
Chloride: 106 mEq/L (ref 96–112)
Creatinine, Ser: 1.1 mg/dL (ref 0.4–1.5)
GFR: 68.71 mL/min (ref >60.00)
Glucose, Bld: 120 mg/dL — ABNORMAL HIGH (ref 70–99)
Potassium: 4.1 mEq/L (ref 3.5–5.1)
Sodium: 137 mEq/L (ref 135–145)

## 2012-04-25 MED ORDER — LISINOPRIL 10 MG PO TABS: 10.0000 mg | ORAL_TABLET | Freq: Every day | ORAL | Status: DC

## 2012-04-25 NOTE — Progress Notes (Signed)
Robert Ochoa Date of Birth: 12-26-1945 Medical Record #[id number]  History of Present Illness: Robert Ochoa is seen back today for a one month check. He is seen for Dr. Johney Frame. He has multiple medical issues which include past pneumonia with associated sepsis, renal failure and respiratory failure back in the summer. Has atrial fib. Has an ICD/pacemaker in place with a set lower rate of 60. Last EF was 35 to 40% back in October. He is managed medically. His other issues include right ventricular dysplasia, ICD implant, PAF and paroxysmal VT. He remains on chronic amiodarone and chronic Pradaxa.   I saw him last month. We reviewed his echo. EF remains reduced. He was agreeable to starting ACE.   He comes in today. He is here alone. He is doing ok. Feels good. Remains active and [facility] BP has been good at home. Not lightheaded or dizzy. No chest pain. Not short of breath. Tolerating his medicines without issue.   Current Outpatient Prescriptions on File Prior to Visit  Medication Sig Dispense Refill  . amiodarone (PACERONE) 200 MG tablet Take 1 tablet (200 mg total) by mouth daily.  90 tablet  3  . Calcium-Magnesium-Zinc (CAL-MAG-ZINC PO) Take 1 tablet by mouth daily.       . dabigatran (PRADAXA) 150 MG CAPS Take 1 capsule (150 mg total) by mouth every 12 (twelve) hours.  60 capsule  6  . escitalopram (LEXAPRO) 20 MG tablet Take 20 mg by mouth daily.      . fluticasone (FLONASE) 50 MCG/ACT nasal spray Place 2 sprays into the nose daily as needed. For congestion      . lisinopril (PRINIVIL,ZESTRIL) 5 MG tablet Take 1 tablet (5 mg total) by mouth daily.  30 tablet  3  . metoprolol tartrate (LOPRESSOR) 25 MG tablet Take 3 tablets (75 mg total) by mouth 2 (two) times daily.  180 tablet  1  . Omega-3 Fatty Acids (FISH OIL) 1000 MG CAPS Take 3 capsules by mouth daily.       . vitamin C (ASCORBIC ACID) 500 MG tablet Take 1,000 mg by mouth daily.         No Known Allergies  Past  Medical History  Diagnosis Date  . Ventricular tachycardia     ICD discharges 05/2011 for this & AF-RVR - started on amiodarone then.  . Arrhythmogenic right ventricular dysplasia     Hx of VT s/p dual-chamber St. Jude ICD. Cardiac MRI 2010 showed moderately dilated RV with hypokinesis, LV EF 47%. EF 50-55% 2010 but down to 45% 05/2011. Cath 01/2009 without significant CAD.  [name] Atrial fibrillation or flutter     Paroxysmal. Started on Pradaxa 05/2011.  . Cardiac defibrillator in place   . Anxiety   . Septic shock(785.52) 10-27-11    with associated respiratory/renal failure/PAF/bradycardia  . LV dysfunction     per echo 10/2011; EF 35%    Past Surgical History  Procedure Date  . Appendectomy   . Cardiac defibrillator placement 02/19/09     St,. Jude     History  Smoking status  . Never Smoker   Smokeless tobacco  . Never Used    History  Alcohol Use  . Yes    Comment: social--3 beers per week    Family History  Problem Relation Age of Onset  . Heart attack Father   . Allergies Mother     Review of Systems: The review of systems is per the HPI.  All other systems were reviewed and are negative.  Physical Exam: BP 110/86  Pulse 64  Ht 6\' 2"  (1.88 m)  Wt 224 lb 12.8 oz (101.969 kg)  BMI 28.86 kg/m2 Patient is very pleasant and in no acute distress. Skin is warm and dry. Color is normal.  HEENT is unremarkable. Normocephalic/atraumatic. PERRL. Sclera are nonicteric. Neck is supple. No masses. No JVD. Lungs are clear. Cardiac exam shows a regular rate and rhythm. Abdomen is soft. Extremities are without edema. Gait and ROM are intact. No gross neurologic deficits noted.  LABORATORY DATA: Lab Results  Component Value Date   WBC 5.6 03/28/2012   HGB 15.7 03/28/2012   HCT 46.9 03/28/2012   PLT 163.0 03/28/2012   GLUCOSE 96 03/28/2012   CHOL  Value: 146        ATP III CLASSIFICATION:  <200     mg/dL   Desirable  161-096  mg/dL   Borderline High  >=045    mg/dL   High         40/98/1191   TRIG 48 02/15/2009   HDL 43 02/15/2009   LDLCALC  Value: 93        Total Cholesterol/HDL:CHD Risk Coronary Heart Disease Risk Table                     Men   Women  1/2 Average Risk   3.4   3.3  Average Risk       5.0   4.4  2 X Average Risk   9.6   7.1  3 X Average Risk  23.4   11.0        Use the calculated Patient Ratio above and the CHD Risk Table to determine the patient's CHD Risk.        ATP III CLASSIFICATION (LDL):  <100     mg/dL   Optimal  478-295  mg/dL   Near or Above                    Optimal  130-159  mg/dL   Borderline  621-308  mg/dL   High  >657     mg/dL   Very High 84/69/6295   ALT 32 03/28/2012   AST 30 03/28/2012   NA 139 03/28/2012   K 4.2 03/28/2012   CL 105 03/28/2012   CREATININE 1.2 03/28/2012   BUN 15 03/28/2012   CO2 26 03/28/2012   TSH 0.61 03/28/2012   INR 1.18 10/30/2011   Echo Study Conclusions from October 2013  - Left ventricle: The cavity size was normal. Wall thickness was normal. Systolic function was moderately reduced. The estimated ejection fraction was in the range of 35% to 40%. Diffuse hypokinesis. Doppler parameters are consistent with abnormal left ventricular relaxation (grade 1 diastolic dysfunction). - Aortic valve: There was no stenosis. - Aorta: Borderline dilated aortic root. Aortic root dimension:56mm (ED). - Mitral valve: Mildly calcified annulus. No significant regurgitation. - Left atrium: The atrium was mildly dilated. - Right ventricle: The cavity size was mildly to moderately dilated. Pacer wire or catheter noted in right ventricle. Systolic function was mildly reduced. - Right atrium: The atrium was mildly dilated. - Tricuspid valve: Peak RV-RA gradient: 23mm Hg (S). - Pulmonary arteries: PA peak pressure: 28mm Hg (S). - Inferior vena cava: The vessel was normal in size; the respirophasic diameter changes were in the normal range (= 50%); findings are consistent with normal central  venous pressure.  Assessment / Plan: 1. Systolic heart failure/nonischemic CM - EF is 35 to 40%. Now on low dose ACE - we will try him on 10 mg of Lisinopril a day.   2. ARVD - has his ICD in place  3. PAF - on amiodarone - needs PFT's arranged for follow up   4. Chronic anticoagulation - on Pradaxa - Has been worried about the cost when he hits the donut hold.  He is to discuss this with Dr. Johney Frame at his return visit.  I think overall he is doing well. Check BMET today. Arrange for his PFT's. See Dr. Johney Frame back in March as planned.   Patient is agreeable to this plan and will call if any problems develop in the interim.

## 2012-04-25 NOTE — Patient Instructions (Addendum)
Stay on your current medicines except increase the Lisinopril to 10 mg daily  We need to check a BMET today (lab)  We need to get PFTs with diffusion scheduled in follow up of your amiodarone (breathing test) next month  See Dr. Johney Frame in March as planned.  Call the [facility] office at [phone] if you have any questions, problems or concerns.

## 2012-04-26 ENCOUNTER — Telehealth: Payer: Self-pay | Admitting: Nurse Practitioner

## 2012-04-26 NOTE — Telephone Encounter (Signed)
called pt back regarding labs

## 2012-04-26 NOTE — Telephone Encounter (Signed)
Pt rtn call , thinks re lab results

## 2012-05-20 DIAGNOSIS — R972 Elevated prostate specific antigen [PSA]: Secondary | ICD-10-CM | POA: Diagnosis not present

## 2012-05-26 DIAGNOSIS — F4001 Agoraphobia with panic disorder: Secondary | ICD-10-CM | POA: Diagnosis not present

## 2012-05-30 ENCOUNTER — Ambulatory Visit: Payer: Medicare Other | Admitting: Internal Medicine

## 2012-05-31 DIAGNOSIS — A088 Other specified intestinal infections: Secondary | ICD-10-CM | POA: Diagnosis not present

## 2012-06-09 ENCOUNTER — Encounter: Payer: Self-pay | Admitting: Internal Medicine

## 2012-06-09 ENCOUNTER — Ambulatory Visit (INDEPENDENT_AMBULATORY_CARE_PROVIDER_SITE_OTHER): Payer: Medicare Other | Admitting: Internal Medicine

## 2012-06-09 VITALS — BP 142/95 | HR 63 | Ht 74.0 in | Wt 226.4 lb

## 2012-06-09 DIAGNOSIS — Q249 Congenital malformation of heart, unspecified: Secondary | ICD-10-CM

## 2012-06-09 DIAGNOSIS — I519 Heart disease, unspecified: Secondary | ICD-10-CM

## 2012-06-09 DIAGNOSIS — I472 Ventricular tachycardia, unspecified: Secondary | ICD-10-CM

## 2012-06-09 DIAGNOSIS — I4891 Unspecified atrial fibrillation: Secondary | ICD-10-CM | POA: Diagnosis not present

## 2012-06-09 DIAGNOSIS — Z95810 Presence of automatic (implantable) cardiac defibrillator: Secondary | ICD-10-CM

## 2012-06-09 LAB — ICD DEVICE OBSERVATION
AL IMPEDENCE ICD: 430 Ohm
BAMS-0001: 150 {beats}/min
BAMS-0003: 60 {beats}/min
RV LEAD IMPEDENCE ICD: 410 Ohm
RV LEAD THRESHOLD: 1 V
TZAT-0004SLOWVT: 8
TZAT-0013SLOWVT: 3
TZAT-0018SLOWVT: NEGATIVE
TZON-0003SLOWVT: 350 ms
TZST-0001SLOWVT: 3
TZST-0003SLOWVT: 40 J

## 2012-06-09 MED ORDER — LISINOPRIL 10 MG PO TABS: 20.0000 mg | ORAL_TABLET | Freq: Every day | ORAL | Status: DC

## 2012-06-09 NOTE — Patient Instructions (Addendum)
Your physician recommends that you schedule a follow-up appointment in: 3 months with Sharrell Ku and 6 months with Dr Johney Frame   Your physician has recommended you make the following change in your medication:  1) Increase Lisinopril to 20mg  daily   Have labs done at follow up with [facility] Liver panel TSH and Free T4   Your physician has requested that you have an echocardiogram. Echocardiography is a painless test that uses sound waves to create images of your heart. It provides your doctor with information about the size and shape of your heart and how well your heart's chambers and valves are working. This procedure takes approximately one hour. There are no restrictions for this procedure.---in 6 months prior to appointment with Dr Johney Frame

## 2012-06-09 NOTE — Progress Notes (Signed)
PCP: Emeterio Reeve, MD  Robert Ochoa is a 67 y.o. male who presents today for routine electrophysiology followup.  Since last being seen in our clinic, the patient reports doing very well.  Today, he denies symptoms of palpitations, chest pain, shortness of breath,  lower extremity edema, dizziness, presyncope, syncope, or ICD shocks.  The patient is otherwise without complaint today.   Past Medical History  Diagnosis Date  . Ventricular tachycardia     ICD discharges 05/2011 for this & AF-RVR - started on amiodarone then.  . Arrhythmogenic right ventricular dysplasia     Hx of VT s/p dual-chamber St. Jude ICD. Cardiac MRI 2010 showed moderately dilated RV with hypokinesis, LV EF 47%. EF 50-55% 2010 but down to 45% 05/2011. Cath 01/2009 without significant CAD.  [name] Atrial fibrillation or flutter     Paroxysmal. Started on Pradaxa 05/2011.  . Cardiac defibrillator in place   . Anxiety   . Septic shock(785.52) 10-27-11    with associated respiratory/renal failure/PAF/bradycardia  . LV dysfunction     per echo 10/2011; EF 35%   Past Surgical History  Procedure Laterality Date  . Appendectomy    . Cardiac defibrillator placement  02/19/09     St,. Jude     Current Outpatient Prescriptions  Medication Sig Dispense Refill  . amiodarone (PACERONE) 200 MG tablet Take 1 tablet (200 mg total) by mouth daily.  90 tablet  3  . Calcium-Magnesium-Zinc (CAL-MAG-ZINC PO) Take 1 tablet by mouth daily.       . dabigatran (PRADAXA) 150 MG CAPS Take 1 capsule (150 mg total) by mouth every 12 (twelve) hours.  60 capsule  6  . escitalopram (LEXAPRO) 20 MG tablet Take 20 mg by mouth daily.      . fluticasone (FLONASE) 50 MCG/ACT nasal spray Place 2 sprays into the nose daily as needed. For congestion      . lisinopril (PRINIVIL,ZESTRIL) 10 MG tablet Take 2 tablets (20 mg total) by mouth daily.  90 tablet  3  . metoprolol tartrate (LOPRESSOR) 25 MG tablet Take 3 tablets (75 mg total) by mouth 2 (two)  times daily.  180 tablet  1  . Omega-3 Fatty Acids (FISH OIL) 1000 MG CAPS Take 3 capsules by mouth daily.       . vitamin C (ASCORBIC ACID) 500 MG tablet Take 1,000 mg by mouth daily.        No current facility-administered medications for this visit.    Physical Exam: Filed Vitals:   06/09/12 0931  BP: 142/95  Pulse: 63  Height: 6\' 2"  (1.88 m)  Weight: 226 lb 6.4 oz (102.694 kg)    GEN- The patient is well appearing, alert and oriented x 3 today.   Head- normocephalic, atraumatic Eyes-  Sclera clear, conjunctiva pink Ears- hearing intact Oropharynx- clear Lungs- Clear to ausculation bilaterally, normal work of breathing Chest- ICD pocket is well healed Heart- Regular rate and rhythm, no murmurs, rubs or gallops, PMI not laterally displaced GI- soft, NT, ND, + BS Extremities- no clubbing, cyanosis, or edema  ICD interrogation- reviewed in detail today,  See PACEART report ekg today reveals atrial pacing, inferior infarction, precordial TWI  Assessment and Plan:  1.  VT Normal ICD function See Pace Art report No changes today Continue amiodarone PFTs ordered LFTs/ TFTs upon return in 3 months  2. AFib Well controlled No changes  3. Chronic systolic dysfunction Increase lisinopril Consider increasing coreg upon return Will need repeat echo in 6 months  Will followup with EP PA in 3 months for further assessment Follow-up with me in 6 months

## 2012-06-10 ENCOUNTER — Ambulatory Visit (INDEPENDENT_AMBULATORY_CARE_PROVIDER_SITE_OTHER): Payer: Medicare Other | Admitting: Internal Medicine

## 2012-06-10 DIAGNOSIS — I4891 Unspecified atrial fibrillation: Secondary | ICD-10-CM

## 2012-06-10 LAB — PULMONARY FUNCTION TEST

## 2012-06-10 NOTE — Progress Notes (Signed)
PFT done today. 

## 2012-06-17 DIAGNOSIS — R972 Elevated prostate specific antigen [PSA]: Secondary | ICD-10-CM | POA: Diagnosis not present

## 2012-06-17 DIAGNOSIS — N400 Enlarged prostate without lower urinary tract symptoms: Secondary | ICD-10-CM | POA: Diagnosis not present

## 2012-06-23 ENCOUNTER — Encounter: Payer: Self-pay | Admitting: Internal Medicine

## 2012-06-24 DIAGNOSIS — F4001 Agoraphobia with panic disorder: Secondary | ICD-10-CM | POA: Diagnosis not present

## 2012-07-11 DIAGNOSIS — F325 Major depressive disorder, single episode, in full remission: Secondary | ICD-10-CM | POA: Diagnosis not present

## 2012-08-01 ENCOUNTER — Other Ambulatory Visit: Payer: Self-pay | Admitting: *Deleted

## 2012-08-01 ENCOUNTER — Ambulatory Visit (INDEPENDENT_AMBULATORY_CARE_PROVIDER_SITE_OTHER): Payer: Medicare Other | Admitting: Surgery

## 2012-08-01 ENCOUNTER — Encounter (INDEPENDENT_AMBULATORY_CARE_PROVIDER_SITE_OTHER): Payer: Self-pay | Admitting: Surgery

## 2012-08-01 VITALS — BP 122/84 | HR 68 | Resp 16 | Ht 74.0 in | Wt 222.8 lb

## 2012-08-01 DIAGNOSIS — K432 Incisional hernia without obstruction or gangrene: Secondary | ICD-10-CM | POA: Diagnosis not present

## 2012-08-01 MED ORDER — DABIGATRAN ETEXILATE MESYLATE 150 MG PO CAPS: 150.0000 mg | ORAL_CAPSULE | Freq: Two times a day (BID) | ORAL | Status: DC

## 2012-08-01 NOTE — Telephone Encounter (Signed)
Patient left message on refill line to have Pradaxa 150 bid refilled.

## 2012-08-01 NOTE — Progress Notes (Signed)
Patient ID: Robert Ochoa, male   DOB: 04/30/1945, 67 y.o.   MRN: [id number]  No chief complaint on file.   HPI Robert Ochoa is a 67 y.o. male.  Patient sent at request of Dr. Johney Frame for incisional hernia. History of laparotomy 12 years ago and relaparotomy during same admission ruptured appendix and subsequent small bowel obstruction. He had an incisional hernia repaired 10 years ago by Dr. Zachery Dakins. He noted a bulge just the right of the midline incision above his umbilicus since 2003 but it has been minimally symptomatic. Last fall, he had more discomfort but that has subsided and he is exercising with minimal problems. Denies constipation or change in bowel or bladder function. HPI  Past Medical History  Diagnosis Date  . Ventricular tachycardia     ICD discharges 05/2011 for this & AF-RVR - started on amiodarone then.  . Arrhythmogenic right ventricular dysplasia     Hx of VT s/p dual-chamber St. Jude ICD. Cardiac MRI 2010 showed moderately dilated RV with hypokinesis, LV EF 47%. EF 50-55% 2010 but down to 45% 05/2011. Cath 01/2009 without significant CAD.  [name] Atrial fibrillation or flutter     Paroxysmal. Started on Pradaxa 05/2011.  . Cardiac defibrillator in place   . Anxiety   . Septic shock(785.52) 10-27-11    with associated respiratory/renal failure/PAF/bradycardia  . LV dysfunction     per echo 10/2011; EF 35%    Past Surgical History  Procedure Laterality Date  . Appendectomy    . Cardiac defibrillator placement  02/19/09     St,. Jude     Family History  Problem Relation Age of Onset  . Heart attack Father   . Allergies Mother     Social History History  Substance Use Topics  . Smoking status: Never Smoker   . Smokeless tobacco: Never Used  . Alcohol Use: Yes     Comment: social--3 beers per week    No Known Allergies  Current Outpatient Prescriptions  Medication Sig Dispense Refill  . amiodarone (PACERONE) 200 MG tablet Take 1 tablet (200 mg total) by  mouth daily.  90 tablet  3  . Calcium-Magnesium-Zinc (CAL-MAG-ZINC PO) Take 1 tablet by mouth daily.       . dabigatran (PRADAXA) 150 MG CAPS Take 1 capsule (150 mg total) by mouth every 12 (twelve) hours.  60 capsule  3  . fluticasone (FLONASE) 50 MCG/ACT nasal spray Place 2 sprays into the nose daily as needed. For congestion      . lisinopril (PRINIVIL,ZESTRIL) 10 MG tablet Take 2 tablets (20 mg total) by mouth daily.  90 tablet  3  . metoprolol tartrate (LOPRESSOR) 25 MG tablet Take 3 tablets (75 mg total) by mouth 2 (two) times daily.  180 tablet  1  . Omega-3 Fatty Acids (FISH OIL) 1000 MG CAPS Take 3 capsules by mouth daily.       . vitamin C (ASCORBIC ACID) 500 MG tablet Take 1,000 mg by mouth daily.        No current facility-administered medications for this visit.    Review of Systems Review of Systems  Constitutional: Negative.   HENT: Negative.   Eyes: Negative.   Cardiovascular: Negative.   Gastrointestinal: Negative.   Endocrine: Negative.   Genitourinary: Negative.   Musculoskeletal: Negative.   Skin: Negative.   Allergic/Immunologic: Negative.   Neurological: Negative.   Hematological: Negative.   Psychiatric/Behavioral: Negative.     Blood pressure 122/84, pulse 68, resp. rate 16, height 6'  2" (1.88 m), weight 222 lb 12.8 oz (101.061 kg).  Physical Exam Physical Exam  Constitutional: He appears well-developed and well-nourished.  HENT:  Head: Normocephalic and atraumatic.  Eyes: EOM are normal. Pupils are equal, round, and reactive to light.  Neck: Normal range of motion. Neck supple.  Cardiovascular: Normal rate and regular rhythm.   Pulmonary/Chest: Effort normal and breath sounds normal.    Abdominal:    Musculoskeletal: Normal range of motion.  Neurological: He is alert.  Skin: Skin is warm and dry.  Psychiatric: He has a normal mood and affect. His behavior is normal. Thought content normal.    Data Reviewed Dr Johney Frame notes  Assessment      Small incisional hernia    Plan    Recommend CT scanning to evaluate size and to evaluate for other hernias prior to consideration of surgical repair since it small and no other hernias detected could be repaired with an open approach. If large or multiple, laparoscopic approach may be better. Return to clinic after imaging was done to decide if surgery and what type would be beneficial. Discussed risk of incarceration or strangulation and other major complication hernia to be 1%.       Rella Egelston A. 08/01/2012, 2:57 PM

## 2012-08-01 NOTE — Patient Instructions (Signed)
Will check CT for hernia.

## 2012-08-02 DIAGNOSIS — F4001 Agoraphobia with panic disorder: Secondary | ICD-10-CM | POA: Diagnosis not present

## 2012-08-02 LAB — BASIC METABOLIC PANEL
Calcium: 9.6 mg/dL (ref 8.4–10.5)
Creat: 1.27 mg/dL (ref 0.50–1.35)
Glucose, Bld: 88 mg/dL (ref 70–99)
Sodium: 137 mEq/L (ref 135–145)

## 2012-08-04 ENCOUNTER — Ambulatory Visit
Admission: RE | Admit: 2012-08-04 | Discharge: 2012-08-04 | Disposition: A | Discharge status: Home / Self Care | Payer: Medicare Other | Source: Ambulatory Visit | Attending: Surgery | Admitting: Surgery

## 2012-08-04 DIAGNOSIS — K432 Incisional hernia without obstruction or gangrene: Secondary | ICD-10-CM

## 2012-08-04 DIAGNOSIS — K4090 Unilateral inguinal hernia, without obstruction or gangrene, not specified as recurrent: Secondary | ICD-10-CM | POA: Diagnosis not present

## 2012-08-04 MED ORDER — IOHEXOL 300 MG/ML  SOLN
125.0000 mL | Freq: Once | INTRAMUSCULAR | Status: AC | PRN
  Administered 2012-08-04: 125 mL via INTRAVENOUS

## 2012-08-08 ENCOUNTER — Encounter (INDEPENDENT_AMBULATORY_CARE_PROVIDER_SITE_OTHER): Payer: Self-pay | Admitting: Surgery

## 2012-08-08 ENCOUNTER — Ambulatory Visit (INDEPENDENT_AMBULATORY_CARE_PROVIDER_SITE_OTHER): Payer: Medicare Other | Admitting: Surgery

## 2012-08-08 VITALS — BP 118/86 | HR 73 | Temp 97.3°F | Resp 16 | Ht 74.0 in | Wt 223.0 lb

## 2012-08-08 DIAGNOSIS — K4020 Bilateral inguinal hernia, without obstruction or gangrene, not specified as recurrent: Secondary | ICD-10-CM

## 2012-08-08 NOTE — Progress Notes (Signed)
Subjective:     Patient ID: Robert Ochoa, male   DOB: Feb 11, 1946, 67 y.o.   MRN: [id number]  HPI Patient returns after CT scan of abdomen pelvis. They show bilateral inguinal hernia which are small. No incisional hernia noted. After reviewing his CT scan, the abdominal wall was thin.  Review of Systems  Respiratory: Negative.   Cardiovascular: Negative.   Gastrointestinal: Negative.        Objective:   Physical Exam  Constitutional: He is oriented to person, place, and time. He appears well-developed and well-nourished.  Musculoskeletal: Normal range of motion.  Neurological: He is alert and oriented to person, place, and time.  Psychiatric: He has a normal mood and affect. His behavior is normal. Thought content normal.  Clinical Data: Ventral hernia. Prior appendectomy with ileus.  CT ABDOMEN AND PELVIS WITH CONTRAST  Technique: Multidetector CT imaging of the abdomen and pelvis was  performed following the standard protocol during bolus  administration of intravenous contrast.  Contrast: OMNIPAQUE IOHEXOL 300 MG/ML SOLN  Comparison: None.  Findings: Lung bases are clear. No pericardial fluid. No focal  hepatic lesion. The gallbladder, pancreas, spleen, adrenal glands,  and kidneys are normal.  Stomach shows a small hiatal hernia. The duodenum small bowel are  normal. Patient status post appendectomy. There are diverticula  of the ascending, transverse, descending colon without acute  inflammation. Extensive diverticular disease of the sigmoid colon  without acute inflammation.  Rectum is normal.  The abdominal aorta normal caliber. No retroperitoneal or  periportal lymphadenopathy.  There is no free fluid in the pelvis. No distal ureteral stones  bladder stones. There are bilateral fat filled inguinal hernias.  There is no evidence of ventral hernia. Ventral surgical incision  is well healed.  There is significant degenerative change of the spine. Bilateral  pars  defect at L5 with grade 1 anterolisthesis.  IMPRESSION:  1. No evidence of ventral hernia. Ventral incisional wound  appears normal.  2. Bilateral small fat filled inguinal hernias.  3. Significant degenerative change of the lumbar spine with  bilateral pars defects at L5 and grade 1 anterolisthesis.  Original Report Authenticated By: Genevive Bi, M.D.       Assessment:     Bilateral inguinal hernia  No evidence of incisional hernia    Plan:     Watchful waiting of bilateral inguinal hernia since asymptomatic and small  Thin abdominal wall noted on CT. At risk for incisional hernia but no surgery this point since no hernia identified. This area could represent a lipoma. No restrictions.

## 2012-08-08 NOTE — Patient Instructions (Signed)
You have bilateral inguinal hernia but no incisional hernia.  No surgery necessary unless you develop pain.   Inguinal Hernia, Adult Muscles help keep everything in the body in its proper place. But if a weak spot in the muscles develops, something can poke through. That is called a hernia. When this happens in the lower part of the belly (abdomen), it is called an inguinal hernia. (It takes its name from a part of the body in this region called the inguinal canal.) A weak spot in the wall of muscles lets some fat or part of the small intestine bulge through. An inguinal hernia can develop at any age. Men get them more often than women. CAUSES  In adults, an inguinal hernia develops over time.  It can be triggered by:  Suddenly straining the muscles of the lower abdomen.  Lifting heavy objects.  Straining to have a bowel movement. Difficult bowel movements (constipation) can lead to this.  Constant coughing. This may be caused by smoking or lung disease.  Being overweight.  Being pregnant.  Working at a job that requires long periods of standing or heavy lifting.  Having had an inguinal hernia before. One type can be an emergency situation. It is called a strangulated inguinal hernia. It develops if part of the small intestine slips through the weak spot and cannot get back into the abdomen. The blood supply can be cut off. If that happens, part of the intestine may die. This situation requires emergency surgery. SYMPTOMS  Often, a small inguinal hernia has no symptoms. It is found when a healthcare provider does a physical exam. Larger hernias usually have symptoms.   In adults, symptoms may include:  A lump in the groin. This is easier to see when the person is standing. It might disappear when lying down.  In men, a lump in the scrotum.  Pain or burning in the groin. This occurs especially when lifting, straining or coughing.  A dull ache or feeling of pressure in the  groin.  Signs of a strangulated hernia can include:  A bulge in the groin that becomes very painful and tender to the touch.  A bulge that turns red or purple.  Fever, nausea and vomiting.  Inability to have a bowel movement or to pass gas. DIAGNOSIS  To decide if you have an inguinal hernia, a healthcare provider will probably do a physical examination.  This will include asking questions about any symptoms you have noticed.  The healthcare provider might feel the groin area and ask you to cough. If an inguinal hernia is felt, the healthcare provider may try to slide it back into the abdomen.  Usually no other tests are needed. TREATMENT  Treatments can vary. The size of the hernia makes a difference. Options include:  Watchful waiting. This is often suggested if the hernia is small and you have had no symptoms.  No medical procedure will be done unless symptoms develop.  You will need to watch closely for symptoms. If any occur, contact your healthcare provider right away.  Surgery. This is used if the hernia is larger or you have symptoms.  Open surgery. This is usually an outpatient procedure (you will not stay overnight in a hospital). An cut (incision) is made through the skin in the groin. The hernia is put back inside the abdomen. The weak area in the muscles is then repaired by herniorrhaphy or hernioplasty. Herniorrhaphy: in this type of surgery, the weak muscles are sewn back  together. Hernioplasty: a patch or mesh is used to close the weak area in the abdominal wall.  Laparoscopy. In this procedure, a surgeon makes small incisions. A thin tube with a tiny video camera (called a laparoscope) is put into the abdomen. The surgeon repairs the hernia with mesh by looking with the video camera and using two long instruments. HOME CARE INSTRUCTIONS   After surgery to repair an inguinal hernia:  You will need to take pain medicine prescribed by your healthcare provider.  Follow all directions carefully.  You will need to take care of the wound from the incision.  Your activity will be restricted for awhile. This will probably include no heavy lifting for several weeks. You also should not do anything too active for a few weeks. When you can return to work will depend on the type of job that you have.  During "watchful waiting" periods, you should:  Maintain a healthy weight.  Eat a diet high in fiber (fruits, vegetables and whole grains).  Drink plenty of fluids to avoid constipation. This means drinking enough water and other liquids to keep your urine clear or pale yellow.  Do not lift heavy objects.  Do not stand for long periods of time.  Quit smoking. This should keep you from developing a frequent cough. SEEK MEDICAL CARE IF:   A bulge develops in your groin area.  You feel pain, a burning sensation or pressure in the groin. This might be worse if you are lifting or straining.  You develop a fever of more than 100.5 F (38.1 C). SEEK IMMEDIATE MEDICAL CARE IF:   Pain in the groin increases suddenly.  A bulge in the groin gets bigger suddenly and does not go down.  For men, there is sudden pain in the scrotum. Or, the size of the scrotum increases.  A bulge in the groin area becomes red or purple and is painful to touch.  You have nausea or vomiting that does not go away.  You feel your heart beating much faster than normal.  You cannot have a bowel movement or pass gas.  You develop a fever of more than 102.0 F (38.9 C). Document Released: 08/02/2008 Document Revised: 06/08/2011 Document Reviewed: 08/02/2008 [facility] : [facility] Patient Information 2013 [place], [place].

## 2012-09-12 NOTE — Progress Notes (Signed)
ELECTROPHYSIOLOGY OFFICE NOTE  Patient ID: Robert Ochoa MRN: [id number], DOB/AGE: April 02, 1945   Date of Visit: 09/13/2012  Primary Physician: Emeterio Reeve, MD Primary Cardiologist: Hillis Range, MD Reason for Visit: EP/device follow-up  History of Present Illness  Robert Ochoa is a 67 year old man with NICM, EF 35-40%, ARVD and paroxysmal VT s/p ICD implant and PAF who presents today for routine electrophysiology followup. Since last being seen in our clinic, he reports he is doing well. Today, he denies chest pain or shortness of breath. He denies palpitations, dizziness, near syncope or syncope. He denies LE swelling, orthopnea, PND or recent weight gain. He walks [address] [place] 2-3 times weekly and exercises at the gym twice weekly without difficulty. Robert Ochoa states that he is compliant and tolerating medications.  Past Medical History Past Medical History  Diagnosis Date  . Ventricular tachycardia     ICD discharges 05/2011 for this & AF-RVR - started on amiodarone then.  . Arrhythmogenic right ventricular dysplasia     Hx of VT s/p dual-chamber St. Jude ICD. Cardiac MRI 2010 showed moderately dilated RV with hypokinesis, LV EF 47%. EF 50-55% 2010 but down to 45% 05/2011. Cath 01/2009 without significant CAD.  [name] Atrial fibrillation or flutter     Paroxysmal. Started on Pradaxa 05/2011.  . Cardiac defibrillator in place   . Anxiety   . Septic shock(785.52) 10-27-11    with associated respiratory/renal failure/PAF/bradycardia  . LV dysfunction     per echo 10/2011; EF 35%    Past Surgical History Past Surgical History  Procedure Laterality Date  . Appendectomy    . Cardiac defibrillator placement  02/19/09     St,. Jude     Allergies/Intolerances No Known Allergies  Current Home Medications Current Outpatient Prescriptions  Medication Sig Dispense Refill  . amiodarone (PACERONE) 200 MG tablet Take 1 tablet (200 mg total) by mouth daily.  90 tablet  3  .  Calcium-Magnesium-Zinc (CAL-MAG-ZINC PO) Take 1 tablet by mouth daily.       . dabigatran (PRADAXA) 150 MG CAPS Take 1 capsule (150 mg total) by mouth every 12 (twelve) hours.  60 capsule  3  . fluticasone (FLONASE) 50 MCG/ACT nasal spray Place 2 sprays into the nose daily as needed. For congestion      . lisinopril (PRINIVIL,ZESTRIL) 10 MG tablet Take 2 tablets (20 mg total) by mouth daily.  90 tablet  3  . metoprolol tartrate (LOPRESSOR) 25 MG tablet Take 3 tablets (75 mg total) by mouth 2 (two) times daily.  180 tablet  1  . Omega-3 Fatty Acids (FISH OIL) 1000 MG CAPS Take 3 capsules by mouth daily.       . vitamin C (ASCORBIC ACID) 500 MG tablet Take 1,000 mg by mouth daily.        No current facility-administered medications for this visit.   Social History Social History  . Marital Status: Divorced   Social History Main Topics  . Smoking status: Never Smoker   . Smokeless tobacco: Never Used  . Alcohol Use: Yes     Comment: social--3 beers per week  . Drug Use: No  . Sexually Active: Yes   Review of Systems General: No chills, fever, night sweats or weight changes Cardiovascular: No chest pain, dyspnea on exertion, edema, orthopnea, palpitations, paroxysmal nocturnal dyspnea Dermatological: No rash, lesions or masses Respiratory: No cough, dyspnea Urologic: No hematuria, dysuria Abdominal: No nausea, vomiting, diarrhea, bright red blood per rectum, melena, or hematemesis Neurologic:  No visual changes, weakness, changes in mental status All other systems reviewed and are otherwise negative except as noted above.  Physical Exam Blood pressure 130/78, pulse 60, height 6\' 3"  (1.905 m), weight 223 lb 2 oz (101.209 kg).  General: Well developed, well appearing 67 year old male in no acute distress. HEENT: Normocephalic, atraumatic. EOMs intact. Sclera nonicteric. Oropharynx clear.  Neck: Supple without bruits. No JVD. Lungs: Respirations regular and unlabored, CTA bilaterally. No  wheezes, rales or rhonchi. Heart: RRR. S1, S2 present. No murmurs, rub, S3 or S4. Abdomen: Soft, non-distended. Extremities: No clubbing, cyanosis or edema. DP/PT/Radials 2+ and equal bilaterally. Psych: Normal affect. Neuro: Alert and oriented X 3. Moves all extremities spontaneously.   Diagnostics Device interrogation today - Normal device function. Thresholds and sensing consistent with previous device measurements. Impedance trends stable over time. No evidence of any ventricular arrhythmias. 1 mode switch episode, 6 seconds in duration, EGMs reviewed and appears as VHR (V rate 115 bpm) - ? slow VT. Will ask Dr. Johney Frame to review. Histogram distribution appropriate for patient and level of activity. Heart failure diagnostics reviewed and stable. No changes made this session. Device programmed at appropriate safety margins. Device programmed to optimize intrinsic conduction. Estimated longevity 3.9 years.  Assessment and Plan 1. NICM, EF 35-40%, with chronic systolic HF  Stable; euvolemic by exam today CorVue reviewed and stable Continue medical therapy 2. ARVD and paroxysmal VT s/p ICD implant Normal device function No programming changes made Will ask Dr. Johney Frame to review AMS episode and advise Continue amiodarone and BB 3. PAF  Stable without recurrence since last visit Continue BB and amiodarone  Will order routine amiodarone surveillance labs today - LFTs, TSH and free T4 Continue Pradaxa for embolic prophylaxis  Signed, Zooey Schreurs, PA-C 09/13/2012, 10:42 AM

## 2012-09-13 ENCOUNTER — Encounter: Payer: Self-pay | Admitting: Cardiology

## 2012-09-13 ENCOUNTER — Ambulatory Visit (INDEPENDENT_AMBULATORY_CARE_PROVIDER_SITE_OTHER): Payer: Medicare Other | Admitting: Cardiology

## 2012-09-13 VITALS — BP 130/78 | HR 60 | Ht 75.0 in | Wt 223.1 lb

## 2012-09-13 DIAGNOSIS — I472 Ventricular tachycardia, unspecified: Secondary | ICD-10-CM

## 2012-09-13 DIAGNOSIS — I5022 Chronic systolic (congestive) heart failure: Secondary | ICD-10-CM

## 2012-09-13 DIAGNOSIS — I4891 Unspecified atrial fibrillation: Secondary | ICD-10-CM | POA: Diagnosis not present

## 2012-09-13 DIAGNOSIS — Z5181 Encounter for therapeutic drug level monitoring: Secondary | ICD-10-CM

## 2012-09-13 DIAGNOSIS — Z95810 Presence of automatic (implantable) cardiac defibrillator: Secondary | ICD-10-CM

## 2012-09-13 DIAGNOSIS — Q248 Other specified congenital malformations of heart: Secondary | ICD-10-CM | POA: Diagnosis not present

## 2012-09-13 DIAGNOSIS — I428 Other cardiomyopathies: Secondary | ICD-10-CM

## 2012-09-13 DIAGNOSIS — Z79899 Other long term (current) drug therapy: Secondary | ICD-10-CM

## 2012-09-13 LAB — ICD DEVICE OBSERVATION
ATRIAL PACING ICD: 98 pct
BAMS-0001: 150 {beats}/min
BAMS-0003: 60 {beats}/min
DEV-0020ICD: NEGATIVE
FVT: 0
HV IMPEDENCE: 51 Ohm
PACEART VT: 0
RV LEAD IMPEDENCE ICD: 410 Ohm
RV LEAD THRESHOLD: 0.75 V
TZAT-0004SLOWVT: 8
TZAT-0013SLOWVT: 3
TZAT-0018SLOWVT: NEGATIVE
TZON-0003SLOWVT: 350 ms
TZST-0001SLOWVT: 3
TZST-0003SLOWVT: 40 J
VF: 0

## 2012-09-13 NOTE — Patient Instructions (Signed)
Your physician recommends that you have lab work today: tsh/liver/free t4  Your physician wants you to follow-up in: 3 months with Dr. Johney Frame. You will receive a reminder letter in the mail two months in advance. If you don't receive a letter, please call our office to schedule the follow-up appointment.  Your physician recommends that you continue on your current medications as directed. Please refer to the Current Medication list given to you today.

## 2012-09-14 DIAGNOSIS — F4001 Agoraphobia with panic disorder: Secondary | ICD-10-CM | POA: Diagnosis not present

## 2012-09-14 LAB — HEPATIC FUNCTION PANEL
AST: 24 U/L (ref 0–37)
Albumin: 3.9 g/dL (ref 3.5–5.2)
Alkaline Phosphatase: 55 U/L (ref 39–117)
Total Bilirubin: 1.1 mg/dL (ref 0.3–1.2)

## 2012-09-14 LAB — T4, FREE: Free T4: 1.3 ng/dL (ref 0.60–1.60)

## 2012-09-14 LAB — TSH: TSH: 1.1 u[IU]/mL (ref 0.35–5.50)

## 2012-10-13 DIAGNOSIS — L723 Sebaceous cyst: Secondary | ICD-10-CM | POA: Diagnosis not present

## 2012-10-27 DIAGNOSIS — L723 Sebaceous cyst: Secondary | ICD-10-CM | POA: Diagnosis not present

## 2012-11-01 DIAGNOSIS — F4001 Agoraphobia with panic disorder: Secondary | ICD-10-CM | POA: Diagnosis not present

## 2012-11-04 ENCOUNTER — Telehealth: Payer: Self-pay | Admitting: Internal Medicine

## 2012-11-07 ENCOUNTER — Other Ambulatory Visit: Payer: Self-pay | Admitting: *Deleted

## 2012-11-07 MED ORDER — METOPROLOL TARTRATE 25 MG PO TABS: 75.0000 mg | ORAL_TABLET | Freq: Two times a day (BID) | ORAL | Status: DC

## 2012-11-15 DIAGNOSIS — L719 Rosacea, unspecified: Secondary | ICD-10-CM | POA: Diagnosis not present

## 2012-11-15 DIAGNOSIS — L570 Actinic keratosis: Secondary | ICD-10-CM | POA: Diagnosis not present

## 2012-11-15 DIAGNOSIS — Z85828 Personal history of other malignant neoplasm of skin: Secondary | ICD-10-CM | POA: Diagnosis not present

## 2012-11-15 DIAGNOSIS — L821 Other seborrheic keratosis: Secondary | ICD-10-CM | POA: Diagnosis not present

## 2012-11-29 ENCOUNTER — Other Ambulatory Visit: Payer: Self-pay | Admitting: Internal Medicine

## 2012-11-30 NOTE — Telephone Encounter (Signed)
New Prob  Pt would like to speak with you regarding Pradaxa. He said that through his insurance he can no longer afford it. He wants to know if there was any medication he can change to.

## 2012-12-01 NOTE — Telephone Encounter (Signed)
Follow Up ° °Pt returned call//  °

## 2012-12-01 NOTE — Telephone Encounter (Signed)
Samples of Pradaxa out front until he has his follow up appointment to discuss what he is going to do.

## 2012-12-01 NOTE — Telephone Encounter (Signed)
Discussed with Dr Johney Frame and left patient a message to return my call.  He can take another one of the antinoval agents or Warfarin.

## 2012-12-05 ENCOUNTER — Encounter: Payer: Self-pay | Admitting: Internal Medicine

## 2012-12-19 ENCOUNTER — Encounter: Payer: Self-pay | Admitting: Internal Medicine

## 2012-12-19 ENCOUNTER — Ambulatory Visit (INDEPENDENT_AMBULATORY_CARE_PROVIDER_SITE_OTHER): Payer: Medicare Other | Admitting: Internal Medicine

## 2012-12-19 VITALS — BP 130/88 | HR 60 | Ht 75.0 in | Wt 225.0 lb

## 2012-12-19 DIAGNOSIS — Z95810 Presence of automatic (implantable) cardiac defibrillator: Secondary | ICD-10-CM

## 2012-12-19 DIAGNOSIS — I519 Heart disease, unspecified: Secondary | ICD-10-CM

## 2012-12-19 DIAGNOSIS — I4891 Unspecified atrial fibrillation: Secondary | ICD-10-CM

## 2012-12-19 DIAGNOSIS — R55 Syncope and collapse: Secondary | ICD-10-CM

## 2012-12-19 DIAGNOSIS — I472 Ventricular tachycardia: Secondary | ICD-10-CM

## 2012-12-19 DIAGNOSIS — Q249 Congenital malformation of heart, unspecified: Secondary | ICD-10-CM | POA: Diagnosis not present

## 2012-12-19 LAB — ICD DEVICE OBSERVATION
AL AMPLITUDE: 2.2 mv
BAMS-0001: 150 {beats}/min
CHARGE TIME: 8.6 s
DEVICE MODEL ICD: 746606
HV IMPEDENCE: 46 Ohm
RV LEAD IMPEDENCE ICD: 425 Ohm
RV LEAD THRESHOLD: 0.75 V
TOT-0006: 20101123000000
TOT-0007: 4
TOT-0009: 2
TOT-0010: 70
TZAT-0013SLOWVT: 3
TZAT-0018SLOWVT: NEGATIVE
TZAT-0019SLOWVT: 7.5 V
TZAT-0020SLOWVT: 1 ms
TZON-0003SLOWVT: 350 ms
TZON-0004SLOWVT: 60
TZON-0005SLOWVT: 6
TZON-0010SLOWVT: 80 ms
TZST-0001SLOWVT: 2
TZST-0001SLOWVT: 4
TZST-0003SLOWVT: 40 J
VENTRICULAR PACING ICD: 14 pct
VF: 0

## 2012-12-19 LAB — CBC WITH DIFFERENTIAL/PLATELET
Basophils Absolute: 0 10*3/uL (ref 0.0–0.1)
HCT: 45.4 % (ref 39.0–52.0)
Hemoglobin: 15.1 g/dL (ref 13.0–17.0)
Lymphocytes Relative: 13.7 % (ref 12.0–46.0)
Lymphs Abs: 0.8 10*3/uL (ref 0.7–4.0)
MCHC: 33.2 g/dL (ref 30.0–36.0)
MCV: 86.6 fl (ref 78.0–100.0)
Monocytes Absolute: 0.6 10*3/uL (ref 0.1–1.0)
Monocytes Relative: 11.5 % (ref 3.0–12.0)
Neutro Abs: 4.1 10*3/uL (ref 1.4–7.7)
Platelets: 183 10*3/uL (ref 150.0–400.0)
RDW: 14.1 % (ref 11.5–14.6)

## 2012-12-19 LAB — BASIC METABOLIC PANEL
CO2: 23 mEq/L (ref 19–32)
Calcium: 9 mg/dL (ref 8.4–10.5)
Chloride: 107 mEq/L (ref 96–112)
Creatinine, Ser: 1.2 mg/dL (ref 0.4–1.5)
GFR: 66.54 mL/min (ref >60.00)
Sodium: 136 mEq/L (ref 135–145)

## 2012-12-19 LAB — HEPATIC FUNCTION PANEL
ALT: 22 U/L (ref 0–53)
AST: 26 U/L (ref 0–37)
Alkaline Phosphatase: 49 U/L (ref 39–117)
Bilirubin, Direct: 0 mg/dL (ref 0.0–0.3)
Total Bilirubin: 0.9 mg/dL (ref 0.3–1.2)
Total Protein: 6.9 g/dL (ref 6.0–8.3)

## 2012-12-19 LAB — TSH: TSH: 0.96 u[IU]/mL (ref 0.35–5.50)

## 2012-12-19 NOTE — Patient Instructions (Addendum)
Your physician wants you to follow-up in: 6 months with Sharrell Ku and 12 months with Dr Jacquiline Doe will receive a reminder letter in the mail two months in advance. If you don't receive a letter, please call our office to schedule the follow-up appointment.    Remote monitoring is used to monitor your Pacemaker or ICD from home. This monitoring reduces the number of office visits required to check your device to one time per year. It allows [place] to keep an eye on the functioning of your device to ensure it is working properly. You are scheduled for a device check from home on 03/20/13. You may send your transmission at any time that day. If you have a wireless device, the transmission will be sent automatically. After your physician reviews your transmission, you will receive a postcard with your next transmission date.  Your physician recommends that you return for lab work today: Liver/TSH/CBC/BMP  Your physician has requested that you have an echocardiogram. Echocardiography is a painless test that uses sound waves to create images of your heart. It provides your doctor with information about the size and shape of your heart and how well your heart's chambers and valves are working. This procedure takes approximately one hour. There are no restrictions for this procedure.

## 2012-12-19 NOTE — Progress Notes (Signed)
  PCP: Emeterio Reeve, MD  Robert Ochoa is a 67 y.o. male who presents today for routine electrophysiology followup.  Since last being seen in our clinic, the patient reports doing very well.  Today, he denies symptoms of palpitations, chest pain, shortness of breath,  lower extremity edema, dizziness, presyncope, syncope, or ICD shocks.  The patient is otherwise without complaint today.   Past Medical History  Diagnosis Date  . Ventricular tachycardia     ICD discharges 05/2011 for this & AF-RVR - started on amiodarone then.  . Arrhythmogenic right ventricular dysplasia     Hx of VT s/p dual-chamber St. Jude ICD. Cardiac MRI 2010 showed moderately dilated RV with hypokinesis, LV EF 47%. EF 50-55% 2010 but down to 45% 05/2011. Cath 01/2009 without significant CAD.  [name] Atrial fibrillation or flutter     Paroxysmal. Started on Pradaxa 05/2011.  . Cardiac defibrillator in place   . Anxiety   . Septic shock(785.52) 10-27-11    with associated respiratory/renal failure/PAF/bradycardia  . LV dysfunction     per echo 10/2011; EF 35%   Past Surgical History  Procedure Laterality Date  . Appendectomy    . Cardiac defibrillator placement  02/19/09     St,. Jude     Current Outpatient Prescriptions  Medication Sig Dispense Refill  . amiodarone (PACERONE) 200 MG tablet Take 1 tablet (200 mg total) by mouth daily.  90 tablet  3  . Calcium-Magnesium-Zinc (CAL-MAG-ZINC PO) Take 1 tablet by mouth daily.       . fluticasone (FLONASE) 50 MCG/ACT nasal spray Place 2 sprays into the nose daily as needed. For congestion      . lisinopril (PRINIVIL,ZESTRIL) 10 MG tablet Take 2 tablets (20 mg total) by mouth daily.  90 tablet  3  . metoprolol tartrate (LOPRESSOR) 25 MG tablet Take 3 tablets (75 mg total) by mouth 2 (two) times daily.  180 tablet  3  . Omega-3 Fatty Acids (FISH OIL) 1000 MG CAPS Take 3 capsules by mouth daily.       [name] PRADAXA 150 MG CAPS capsule TAKE 1 CAPSULE BY MOUTH EVERY 12 HOURS  60  capsule  1  . vitamin C (ASCORBIC ACID) 500 MG tablet Take 1,000 mg by mouth daily.        No current facility-administered medications for this visit.    Physical Exam: Filed Vitals:   12/19/12 1101  BP: 130/88  Pulse: 60  Height: 6\' 3"  (1.905 m)  Weight: 225 lb (102.059 kg)    GEN- The patient is well appearing, alert and oriented x 3 today.   Head- normocephalic, atraumatic Eyes-  Sclera clear, conjunctiva pink Ears- hearing intact Oropharynx- clear Lungs- Clear to ausculation bilaterally, normal work of breathing Chest- ICD pocket is well healed Heart- Regular rate and rhythm, no murmurs, rubs or gallops, PMI not laterally displaced GI- soft, NT, ND, + BS Extremities- no clubbing, cyanosis, or edema  ICD interrogation- reviewed in detail today,  See PACEART report ekg today reveals atrial pacing 60 bpm, nonspecific ST/T changes (unchanged)  Assessment and Plan:  1.  VT Normal ICD function See Arita Miss Art report No changes today Continue amiodarone PFTs reviewed form 3/14 LFTs/ TFTs  2. AFib Well controlled No changes Continue pradaxa Cbc and bmet  3. Chronic systolic dysfunction Continue lisinopril Consider increasing coreg upon return Repeat echo is ordered  Will followup with EP PA in 6 months for further assessment Follow-up with me in 12 months

## 2012-12-21 ENCOUNTER — Other Ambulatory Visit: Payer: Self-pay | Admitting: Internal Medicine

## 2012-12-22 DIAGNOSIS — F4001 Agoraphobia with panic disorder: Secondary | ICD-10-CM | POA: Diagnosis not present

## 2012-12-30 ENCOUNTER — Telehealth: Payer: Self-pay | Admitting: Internal Medicine

## 2012-12-30 NOTE — Telephone Encounter (Signed)
Returned call to patient no answer.Left message on personal voice mail pradaxa 150 mg samples left at front desk 3rd floor.

## 2012-12-30 NOTE — Telephone Encounter (Signed)
New Problem  Pt states he was advised by the nurse to call the day before the surgery for Pradaxa samples. Please call and confirm that the samples are placed at the front office.

## 2013-01-02 ENCOUNTER — Other Ambulatory Visit (HOSPITAL_COMMUNITY): Payer: Self-pay | Admitting: Internal Medicine

## 2013-01-02 ENCOUNTER — Ambulatory Visit (HOSPITAL_COMMUNITY): Payer: Medicare Other | Attending: Internal Medicine | Admitting: Radiology

## 2013-01-02 DIAGNOSIS — I472 Ventricular tachycardia, unspecified: Secondary | ICD-10-CM | POA: Diagnosis not present

## 2013-01-02 DIAGNOSIS — I4891 Unspecified atrial fibrillation: Secondary | ICD-10-CM

## 2013-01-02 DIAGNOSIS — I4729 Other ventricular tachycardia: Secondary | ICD-10-CM | POA: Insufficient documentation

## 2013-01-02 DIAGNOSIS — R55 Syncope and collapse: Secondary | ICD-10-CM | POA: Insufficient documentation

## 2013-01-02 NOTE — Progress Notes (Signed)
Echocardiogram performed.  

## 2013-01-03 ENCOUNTER — Encounter: Payer: Self-pay | Admitting: Internal Medicine

## 2013-01-03 ENCOUNTER — Other Ambulatory Visit: Payer: Self-pay | Admitting: Internal Medicine

## 2013-01-20 DIAGNOSIS — Z23 Encounter for immunization: Secondary | ICD-10-CM | POA: Diagnosis not present

## 2013-02-10 ENCOUNTER — Telehealth: Payer: Self-pay | Admitting: Internal Medicine

## 2013-02-10 NOTE — Telephone Encounter (Signed)
New message     Have defib---want to talk to st jude rep Robert Ochoa) Dr Johney Frame did not know answer. Robert Ochoa called pt but he was not available and he lost his number.  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

## 2013-02-10 NOTE — Telephone Encounter (Signed)
Left message for patient with the contact information for [facility] with [employer].

## 2013-02-27 ENCOUNTER — Telehealth: Payer: Self-pay | Admitting: Internal Medicine

## 2013-02-27 NOTE — Telephone Encounter (Signed)
New Problem  Pt called requests samples/// sent to refills depart

## 2013-02-27 NOTE — Telephone Encounter (Signed)
Provided pradaxa 150 mg samples 4 boxes, it will be a little less than a months worth

## 2013-02-27 NOTE — Telephone Encounter (Signed)
Pt takes BP/pulse at gym. He's seen rate in 50s. Recently saw 44.  Pt coming in for devic clinic check 02/28/13 @ 2:30

## 2013-02-27 NOTE — Telephone Encounter (Signed)
Left VM @5 :24 02/27/13.

## 2013-02-27 NOTE — Telephone Encounter (Signed)
New Problem  Pt called states he has a pacer that is set for a min heart beat of 60 beats per min//He says the Pacer is showing that his heart is beating less than 60 beats a minute and that the pacer was originally set to not go below 60 beats// Pt says he "feels fine he simply requests a call back to make sure everything is ok" ! Please call back to discuss.

## 2013-02-28 ENCOUNTER — Other Ambulatory Visit: Payer: Self-pay | Admitting: *Deleted

## 2013-02-28 ENCOUNTER — Encounter: Payer: Self-pay | Admitting: *Deleted

## 2013-02-28 ENCOUNTER — Encounter (HOSPITAL_COMMUNITY): Payer: Self-pay | Admitting: Pharmacy Technician

## 2013-02-28 ENCOUNTER — Ambulatory Visit (INDEPENDENT_AMBULATORY_CARE_PROVIDER_SITE_OTHER): Payer: Medicare Other | Admitting: *Deleted

## 2013-02-28 ENCOUNTER — Ambulatory Visit (INDEPENDENT_AMBULATORY_CARE_PROVIDER_SITE_OTHER): Payer: Medicare Other | Admitting: Internal Medicine

## 2013-02-28 DIAGNOSIS — Q249 Congenital malformation of heart, unspecified: Secondary | ICD-10-CM | POA: Diagnosis not present

## 2013-02-28 DIAGNOSIS — Z95810 Presence of automatic (implantable) cardiac defibrillator: Secondary | ICD-10-CM

## 2013-02-28 DIAGNOSIS — T82198A Other mechanical complication of other cardiac electronic device, initial encounter: Secondary | ICD-10-CM | POA: Diagnosis not present

## 2013-02-28 DIAGNOSIS — Z01812 Encounter for preprocedural laboratory examination: Secondary | ICD-10-CM

## 2013-02-28 DIAGNOSIS — I472 Ventricular tachycardia: Secondary | ICD-10-CM

## 2013-02-28 LAB — MDC_IDC_ENUM_SESS_TYPE_INCLINIC: Implantable Pulse Generator Serial Number: 746606

## 2013-02-28 NOTE — Patient Instructions (Signed)
Your physician recommends that you continue on your current medications as directed. Please refer to the Current Medication list given to you today.  Your physician recommends that you have pre-procedure lab work tomorrow morning at hospital before procedure.   Your physician has recommended that you have a defibrillator generator change tomorrow at 8:30 am. Be at hospital at 6:30 am

## 2013-02-28 NOTE — Assessment & Plan Note (Signed)
the patient has failure of his defibrillator. He was implanted for secondary prevention in he has had appropriate therapy. He's been advised to go to hospital which he has declined. He will plan to show up tomorrow morning we'll undertake device generator replacement tomorrow. We anticipate that the leads are normal. We reviewed the potential risks of infection.

## 2013-02-28 NOTE — Assessment & Plan Note (Signed)
Will have to review with him his family issues related to screening

## 2013-02-28 NOTE — Assessment & Plan Note (Signed)
As above.

## 2013-02-28 NOTE — Progress Notes (Signed)
Pt checked pulse through blood pressure machine at [facility]. Pt noticed pulse in mid 50's, saw one occasion his pulse was 44. I brought him today to inspect lead/device functions.  Attempted to interrogate device w/ all 3 [employer] programmers---nothing could successfully communicate whether wanded or through telemetry. Industry & tech svcs attempted trouble shooting. Attempted to connect device w/ new Merlin---Merlin could not successfully identify either.   Pt to be changed out tomorrow (03/01/13) per Dr. Graciela Husbands. Pt prefers to go home tonight---pt well aware he is not protected due to device not functioning and aware he has had successful device therapy in the past.

## 2013-02-28 NOTE — Progress Notes (Signed)
      Patient Care Team: Emeterio Reeve, MD as PCP - General (Family Medicine)   HPI  Robert Ochoa is a 67 y.o. male Distal for his who presented today with defibrillator failure. He carries a diagnosis of arrhythmogenic RV cardiomyopathy with abnormal ECG and abnormal MRI that ventricular tachycardia for which he underwent secondary prevention ICD. He has had subsequent therapy and it is appropriate.  He also has a history of atrial fibrillation for which he takes Pradaxa.  About 2 weeks ago he noted that his heart rate was no longer 60. It has been sent at a lower rate of 60. He comes in today and [employer]. Jude has been unable to communicate with his device despite numerous  Strategies.  He works around [employer]  Past Medical History  Diagnosis Date  . Ventricular tachycardia     ICD discharges 05/2011 for this & AF-RVR - started on amiodarone then.  . Arrhythmogenic right ventricular dysplasia     Hx of VT s/p dual-chamber St. Jude ICD. Cardiac MRI 2010 showed moderately dilated RV with hypokinesis, LV EF 47%. EF 50-55% 2010 but down to 45% 05/2011. Cath 01/2009 without significant CAD.  [name] Atrial fibrillation or flutter     Paroxysmal. Started on Pradaxa 05/2011.  . Cardiac defibrillator in place   . Anxiety   . Septic shock(785.52) 10-27-11    with associated respiratory/renal failure/PAF/bradycardia  . LV dysfunction     per echo 10/2011; EF 35%    Past Surgical History  Procedure Laterality Date  . Appendectomy    . Cardiac defibrillator placement  02/19/09     St,. Jude     Current Outpatient Prescriptions  Medication Sig Dispense Refill  . amiodarone (PACERONE) 200 MG tablet take 1 tablet by mouth once daily  90 tablet  3  . Calcium-Magnesium-Zinc (CAL-MAG-ZINC PO) Take 1 tablet by mouth daily.       . fluticasone (FLONASE) 50 MCG/ACT nasal spray Place 2 sprays into the nose daily as needed. For congestion      . lisinopril (PRINIVIL,ZESTRIL) 10 MG tablet  TAKE 2 TABLETS BY MOUTH ONCE DAILY  90 tablet  3  . metoprolol tartrate (LOPRESSOR) 25 MG tablet Take 3 tablets (75 mg total) by mouth 2 (two) times daily.  180 tablet  3  . Omega-3 Fatty Acids (FISH OIL) 1000 MG CAPS Take 3 capsules by mouth daily.       [name] PRADAXA 150 MG CAPS capsule TAKE 1 CAPSULE BY MOUTH EVERY 12 HOURS  60 capsule  1  . vitamin C (ASCORBIC ACID) 500 MG tablet Take 1,000 mg by mouth daily.        No current facility-administered medications for this visit.    No Known Allergies  Review of Systems negative except from HPI and PMH  Physical Exam There were no vitals taken for this visit. Well developed and nourished in no acute distress HENT normal Neck supple with JVP-flat Clear Device pocket well healed; without hematoma or erythema.  There is no tethering  Regular rate and rhythm, no murmurs or gallops Abd-soft with active BS No Clubbing cyanosis edema Skin-warm and dry A & Oriented  Grossly normal sensory and motor function     Assessment and  Plan

## 2013-03-01 ENCOUNTER — Ambulatory Visit (HOSPITAL_COMMUNITY)
Admission: RE | Admit: 2013-03-01 | Discharge: 2013-03-01 | Disposition: A | Discharge status: Home / Self Care | Payer: Medicare Other | Source: Ambulatory Visit | Attending: Internal Medicine | Admitting: Internal Medicine

## 2013-03-01 ENCOUNTER — Encounter (HOSPITAL_COMMUNITY)
Admission: RE | Disposition: A | Discharge status: Home / Self Care | Payer: Self-pay | Source: Ambulatory Visit | Attending: Internal Medicine

## 2013-03-01 ENCOUNTER — Other Ambulatory Visit (HOSPITAL_COMMUNITY): Payer: Self-pay

## 2013-03-01 DIAGNOSIS — I428 Other cardiomyopathies: Secondary | ICD-10-CM | POA: Diagnosis not present

## 2013-03-01 DIAGNOSIS — Z4502 Encounter for adjustment and management of automatic implantable cardiac defibrillator: Secondary | ICD-10-CM | POA: Insufficient documentation

## 2013-03-01 DIAGNOSIS — Z95810 Presence of automatic (implantable) cardiac defibrillator: Secondary | ICD-10-CM

## 2013-03-01 DIAGNOSIS — Z7902 Long term (current) use of antithrombotics/antiplatelets: Secondary | ICD-10-CM | POA: Insufficient documentation

## 2013-03-01 DIAGNOSIS — I472 Ventricular tachycardia, unspecified: Secondary | ICD-10-CM | POA: Insufficient documentation

## 2013-03-01 DIAGNOSIS — I4891 Unspecified atrial fibrillation: Secondary | ICD-10-CM | POA: Diagnosis not present

## 2013-03-01 DIAGNOSIS — T829XXA Unspecified complication of cardiac and vascular prosthetic device, implant and graft, initial encounter: Secondary | ICD-10-CM

## 2013-03-01 DIAGNOSIS — I4729 Other ventricular tachycardia: Secondary | ICD-10-CM | POA: Diagnosis not present

## 2013-03-01 HISTORY — PX: IMPLANTABLE CARDIOVERTER DEFIBRILLATOR GENERATOR CHANGE: SHX5859

## 2013-03-01 HISTORY — PX: IMPLANTABLE CARDIOVERTER DEFIBRILLATOR (ICD) GENERATOR CHANGE: SHX5469

## 2013-03-01 LAB — CBC
HCT: 45.8 % (ref 39.0–52.0)
Hemoglobin: 15.4 g/dL (ref 13.0–17.0)
MCH: 29.6 pg (ref 26.0–34.0)
MCHC: 33.6 g/dL (ref 30.0–36.0)
MCV: 87.9 fL (ref 78.0–100.0)
Platelets: 160 10*3/uL (ref 150–400)
RBC: 5.21 MIL/uL (ref 4.22–5.81)
RDW: 13.9 % (ref 11.5–15.5)
WBC: 5.1 10*3/uL (ref 4.0–10.5)

## 2013-03-01 LAB — BASIC METABOLIC PANEL
BUN: 19 mg/dL (ref 6–23)
CO2: 22 mEq/L (ref 19–32)
Calcium: 8.7 mg/dL (ref 8.4–10.5)
Creatinine, Ser: 1.14 mg/dL (ref 0.50–1.35)
GFR calc Af Amer: 74 mL/min — ABNORMAL LOW (ref >90)
Glucose, Bld: 97 mg/dL (ref 70–99)
Potassium: 4.2 mEq/L (ref 3.5–5.1)
Sodium: 140 mEq/L (ref 135–145)

## 2013-03-01 LAB — SURGICAL PCR SCREEN
MRSA, PCR: NEGATIVE
Staphylococcus aureus: NEGATIVE

## 2013-03-01 SURGERY — ICD GENERATOR CHANGE
Anesthesia: LOCAL

## 2013-03-01 MED ORDER — ONDANSETRON HCL 4 MG/2ML IJ SOLN: 4.0000 mg | Freq: Four times a day (QID) | INTRAMUSCULAR | Status: DC | PRN

## 2013-03-01 MED ORDER — MIDAZOLAM HCL 5 MG/5ML IJ SOLN
INTRAMUSCULAR | Status: AC
  Filled 2013-03-01: qty 5

## 2013-03-01 MED ORDER — CEFAZOLIN SODIUM-DEXTROSE 2-3 GM-% IV SOLR
INTRAVENOUS | Status: AC
  Filled 2013-03-01: qty 50

## 2013-03-01 MED ORDER — SODIUM CHLORIDE 0.9 % IV SOLN: INTRAVENOUS | Status: DC

## 2013-03-01 MED ORDER — MUPIROCIN 2 % EX OINT
TOPICAL_OINTMENT | CUTANEOUS | Status: AC
  Filled 2013-03-01: qty 22

## 2013-03-01 MED ORDER — MUPIROCIN 2 % EX OINT
TOPICAL_OINTMENT | Freq: Two times a day (BID) | CUTANEOUS | Status: DC
  Administered 2013-03-01: 07:00:00 via NASAL
  Filled 2013-03-01: qty 22

## 2013-03-01 MED ORDER — ACETAMINOPHEN 325 MG PO TABS
325.0000 mg | ORAL_TABLET | ORAL | Status: DC | PRN
  Filled 2013-03-01: qty 2

## 2013-03-01 MED ORDER — CHLORHEXIDINE GLUCONATE 4 % EX LIQD
60.0000 mL | Freq: Once | CUTANEOUS | Status: DC
  Filled 2013-03-01: qty 60

## 2013-03-01 MED ORDER — LIDOCAINE HCL (PF) 1 % IJ SOLN
INTRAMUSCULAR | Status: AC
  Filled 2013-03-01: qty 60

## 2013-03-01 MED ORDER — SODIUM CHLORIDE 0.9 % IV SOLN: INTRAVENOUS | Status: AC

## 2013-03-01 MED ORDER — CEFAZOLIN SODIUM-DEXTROSE 2-3 GM-% IV SOLR: 2.0000 g | INTRAVENOUS | Status: DC

## 2013-03-01 MED ORDER — FENTANYL CITRATE 0.05 MG/ML IJ SOLN
INTRAMUSCULAR | Status: AC
  Filled 2013-03-01: qty 2

## 2013-03-01 MED ORDER — GENTAMICIN SULFATE 40 MG/ML IJ SOLN
80.0000 mg | INTRAMUSCULAR | Status: DC
  Filled 2013-03-01: qty 2

## 2013-03-01 NOTE — Interval H&P Note (Signed)
ICD Criteria  Current LVEF (within 6 months):45%  NYHA Functional Classification: Class I  Heart Failure History:  No.  Non-Ischemic Dilated Cardiomyopathy History:  No.  Atrial Fibrillation/Atrial Flutter:  No.  Ventricular Tachycardia History:  Yes, Hemodynamic instability present, VT Type:  SVT - Monomorphic.  Cardiac Arrest History:  No  History of Syndromes with Risk of Sudden Death:  No.  Previous ICD:  Yes, ICD Type:  Dual, Reason for ICD:  Secondary, reason for secondary prevention:  Syncope with Inducible VT.  Electrophysiology Study: No.  Prior MI: No.  PPM: No.  OSA:  No  Patient Life Expectancy of >=1 year: Yes.  Anticoagulation Therapy:  Patient is NOT on anticoagulation therapy.   Beta Blocker Therapy:  Yes.   Ace Inhibitor/ARB Therapy:  Yes. History and Physical Interval Note:  03/01/2013 8:18 AM  Robert Ochoa  has presented today for surgery, with the diagnosis of icd eol  The various methods of treatment have been discussed with the patient and family. After consideration of risks, benefits and other options for treatment, the patient has consented to  Procedure(s): ICD GENERATOR CHANGE (N/A) as a surgical intervention .  The patient's history has been reviewed, patient examined, no change in status, stable for surgery.  I have reviewed the patient's chart and labs.  Questions were answered to the patient's satisfaction.     Sherryl Manges

## 2013-03-01 NOTE — H&P (View-Only) (Signed)
      Patient Care Team: Sharon A Wolters, MD as PCP - General (Family Medicine)   HPI  Robert Ochoa is a 67 y.o. male Distal for his who presented today with defibrillator failure. He carries a diagnosis of arrhythmogenic RV cardiomyopathy with abnormal ECG and abnormal MRI that ventricular tachycardia for which he underwent secondary prevention ICD. He has had subsequent therapy and it is appropriate.  He also has a history of atrial fibrillation for which he takes Pradaxa.  About 2 weeks ago he noted that his heart rate was no longer 60. It has been sent at a lower rate of 60. He comes in today and St. Jude has been unable to communicate with his device despite numerous  Strategies.  He works around [employer]  Past Medical History  Diagnosis Date  . Ventricular tachycardia     ICD discharges 05/2011 for this & AF-RVR - started on amiodarone then.  . Arrhythmogenic right ventricular dysplasia     Hx of VT s/p dual-chamber St. Jude ICD. Cardiac MRI 2010 showed moderately dilated RV with hypokinesis, LV EF 47%. EF 50-55% 2010 but down to 45% 05/2011. Cath 01/2009 without significant CAD.  . Atrial fibrillation or flutter     Paroxysmal. Started on Pradaxa 05/2011.  . Cardiac defibrillator in place   . Anxiety   . Septic shock(785.52) 10-27-11    with associated respiratory/renal failure/PAF/bradycardia  . LV dysfunction     per echo 10/2011; EF 35%    Past Surgical History  Procedure Laterality Date  . Appendectomy    . Cardiac defibrillator placement  02/19/09     St,. Jude     Current Outpatient Prescriptions  Medication Sig Dispense Refill  . amiodarone (PACERONE) 200 MG tablet take 1 tablet by mouth once daily  90 tablet  3  . Calcium-Magnesium-Zinc (CAL-MAG-ZINC PO) Take 1 tablet by mouth daily.       . fluticasone (FLONASE) 50 MCG/ACT nasal spray Place 2 sprays into the nose daily as needed. For congestion      . lisinopril (PRINIVIL,ZESTRIL) 10 MG tablet  TAKE 2 TABLETS BY MOUTH ONCE DAILY  90 tablet  3  . metoprolol tartrate (LOPRESSOR) 25 MG tablet Take 3 tablets (75 mg total) by mouth 2 (two) times daily.  180 tablet  3  . Omega-3 Fatty Acids (FISH OIL) 1000 MG CAPS Take 3 capsules by mouth daily.       . PRADAXA 150 MG CAPS capsule TAKE 1 CAPSULE BY MOUTH EVERY 12 HOURS  60 capsule  1  . vitamin C (ASCORBIC ACID) 500 MG tablet Take 1,000 mg by mouth daily.        No current facility-administered medications for this visit.    No Known Allergies  Review of Systems negative except from HPI and PMH  Physical Exam There were no vitals taken for this visit. Well developed and nourished in no acute distress HENT normal Neck supple with JVP-flat Clear Device pocket well healed; without hematoma or erythema.  There is no tethering  Regular rate and rhythm, no murmurs or gallops Abd-soft with active BS No Clubbing cyanosis edema Skin-warm and dry A & Oriented  Grossly normal sensory and motor function     Assessment and  Plan  

## 2013-03-01 NOTE — Progress Notes (Signed)
Received pt from cath procedure arousable and able to tolerate fluids.  Post-op site intact and denies any complaint of pain.

## 2013-03-01 NOTE — CV Procedure (Signed)
Preoperative diagnosis  ARVC VT prev ICD with device failure Postoperative diagnosis same/   Procedure: Generator replacement     Following informed consent the patient was brought to the electrophysiology laboratory in place of the fluoroscopic table in the supine position after routine prep and drape lidocaine was infiltrated in the region of the previous incision and carried down to later the device pocket using sharp dissection and electrocautery. The pocket was opened the device was freed up and was explanted.  Interrogation of the previously implanted ICD ventricular lead 7121  demonstrated an R wave 7.6 millivolts., and impedance of 4489 ohms, and a pacing threshold of 0.7 volts at 0.5 msec.       The previously implanted atrial lead 1888 demonstrated a P-wave amplitude of 2.7 milllivolts  and impedance of  464 ohms, and a pacing threshold of 1.0 volts at  @ 0.62milliseconds.  The leads were inspected. Repair was not  needed. The leads were then attached to a St Jude [id number] pulse generator, serial number [id number].    Through the device the P-wave amplitude  Was  3.1 milllivolts and impedance of  440 ohms, and a pacing threshold of 1.0 volts at @ 0.10milliseconds; the ICD ventricular lead demonstrated an R wave of 7.0  millivolts., and impedance of 430 ohms, and a pacing threshold of 0.7 volts at 0.5 msec    High voltage impedances were 50 ohms  The pocket was irrigated with antibiotic containing saline solution hemostasis was assured and the leads and the device were placed in the pocket. The wound was then closed in 3 layers in normal fashion.  Dermabond  Dressing was applied   The patient tolerated the procedure without apparent complication.  DFT testing was not performed  Sherryl Manges   \

## 2013-03-01 NOTE — Progress Notes (Signed)
Discharge instruction given per MD order.  Pt able to verbalize understanding.  Pt denies any discomfort . Pt to car via wheelchair

## 2013-03-06 ENCOUNTER — Other Ambulatory Visit: Payer: Self-pay

## 2013-03-06 MED ORDER — METOPROLOL TARTRATE 25 MG PO TABS: 75.0000 mg | ORAL_TABLET | Freq: Two times a day (BID) | ORAL | Status: DC

## 2013-03-07 DIAGNOSIS — Z85828 Personal history of other malignant neoplasm of skin: Secondary | ICD-10-CM | POA: Diagnosis not present

## 2013-03-07 DIAGNOSIS — L570 Actinic keratosis: Secondary | ICD-10-CM | POA: Diagnosis not present

## 2013-03-07 DIAGNOSIS — L821 Other seborrheic keratosis: Secondary | ICD-10-CM | POA: Diagnosis not present

## 2013-03-13 ENCOUNTER — Ambulatory Visit (INDEPENDENT_AMBULATORY_CARE_PROVIDER_SITE_OTHER): Payer: Medicare Other | Admitting: *Deleted

## 2013-03-13 DIAGNOSIS — I498 Other specified cardiac arrhythmias: Secondary | ICD-10-CM | POA: Diagnosis not present

## 2013-03-13 DIAGNOSIS — I4891 Unspecified atrial fibrillation: Secondary | ICD-10-CM

## 2013-03-13 DIAGNOSIS — R001 Bradycardia, unspecified: Secondary | ICD-10-CM

## 2013-03-13 DIAGNOSIS — I472 Ventricular tachycardia: Secondary | ICD-10-CM

## 2013-03-13 LAB — MDC_IDC_ENUM_SESS_TYPE_INCLINIC
Brady Statistic RA Percent Paced: 98 %
Brady Statistic RV Percent Paced: 17 %
Implantable Pulse Generator Model: 2411
Lead Channel Impedance Value: 425 Ohm
Lead Channel Impedance Value: 487.5 Ohm
Lead Channel Pacing Threshold Amplitude: 1 V
Lead Channel Pacing Threshold Pulse Width: 0.5 ms
Lead Channel Sensing Intrinsic Amplitude: 2.4 mV
Lead Channel Setting Pacing Amplitude: 2 V
Lead Channel Setting Pacing Amplitude: 2.5 V
Lead Channel Setting Pacing Pulse Width: 0.5 ms
Lead Channel Setting Sensing Sensitivity: 0.5 mV
Zone Setting Detection Interval: 300 ms
Zone Setting Detection Interval: 350 ms

## 2013-03-14 NOTE — Progress Notes (Signed)
Wound check appointment. Steri-strips removed. Wound without redness or edema. Incision edges approximated, wound well healed. Normal device function. Thresholds, sensing, and impedances consistent with implant measurements. Device programmed at 3.5V for extra safety margin until 3 month visit. Histogram distribution appropriate for patient and level of activity. No mode switches. 1 VT epsiode with successful ATP therapy. Pt instructed on no driving x 6 mths due to ATP therapy. Patient educated about wound care, arm mobility, lifting restrictions, shock plan. ROV in 3 months with JA.

## 2013-03-20 ENCOUNTER — Telehealth: Payer: Self-pay

## 2013-03-20 NOTE — Telephone Encounter (Signed)
Patient called need samples of pradaxa 150 mg left samples up front

## 2013-03-22 ENCOUNTER — Encounter: Payer: Self-pay | Admitting: Internal Medicine

## 2013-04-03 ENCOUNTER — Telehealth: Payer: Self-pay | Admitting: *Deleted

## 2013-04-03 NOTE — Telephone Encounter (Signed)
Pt c/o of "discomfort" from new device. I had pt send a remote transmission---transmission shows no episodes or alerts. Pt feels discomfort initially when walking (and when standing after tying shoes) but feels much better once into his stride; pt will walk for about 1 mile and feels fine shortly after beginning. Pt does not have light-headedness when standing up but has abdominal tightness "as in after a workout".  Pt thought his rate response turned on immediately when standing up or intially walking causing his heart rate to increase. I explained there's an accelerometer than will not react immediately unless programmed to do so.   Pt will continue to watch symptoms and call if they are persistent. Pt requesting I update Dr. Rayann Heman.  Pt wants to know if he should come see Dr. Rayann Heman about his abdominal sensations that did not occur prior to his device change in early Dec/2014.

## 2013-04-13 ENCOUNTER — Encounter: Payer: Self-pay | Admitting: Internal Medicine

## 2013-05-09 DIAGNOSIS — H5210 Myopia, unspecified eye: Secondary | ICD-10-CM | POA: Diagnosis not present

## 2013-05-09 DIAGNOSIS — H2589 Other age-related cataract: Secondary | ICD-10-CM | POA: Diagnosis not present

## 2013-05-09 DIAGNOSIS — H524 Presbyopia: Secondary | ICD-10-CM | POA: Diagnosis not present

## 2013-05-09 DIAGNOSIS — H52229 Regular astigmatism, unspecified eye: Secondary | ICD-10-CM | POA: Diagnosis not present

## 2013-05-31 DIAGNOSIS — K5289 Other specified noninfective gastroenteritis and colitis: Secondary | ICD-10-CM | POA: Diagnosis not present

## 2013-05-31 DIAGNOSIS — R197 Diarrhea, unspecified: Secondary | ICD-10-CM | POA: Diagnosis not present

## 2013-06-05 ENCOUNTER — Other Ambulatory Visit: Payer: Self-pay | Admitting: *Deleted

## 2013-06-05 MED ORDER — DABIGATRAN ETEXILATE MESYLATE 150 MG PO CAPS: ORAL_CAPSULE | ORAL | Status: DC

## 2013-06-12 DIAGNOSIS — R972 Elevated prostate specific antigen [PSA]: Secondary | ICD-10-CM | POA: Diagnosis not present

## 2013-06-19 DIAGNOSIS — N400 Enlarged prostate without lower urinary tract symptoms: Secondary | ICD-10-CM | POA: Diagnosis not present

## 2013-06-19 DIAGNOSIS — R972 Elevated prostate specific antigen [PSA]: Secondary | ICD-10-CM | POA: Diagnosis not present

## 2013-06-21 ENCOUNTER — Encounter: Payer: Self-pay | Admitting: Internal Medicine

## 2013-06-21 ENCOUNTER — Ambulatory Visit (INDEPENDENT_AMBULATORY_CARE_PROVIDER_SITE_OTHER): Payer: Medicare Other | Admitting: Internal Medicine

## 2013-06-21 VITALS — BP 126/82 | HR 72 | Ht 74.0 in | Wt 225.4 lb

## 2013-06-21 DIAGNOSIS — I472 Ventricular tachycardia: Secondary | ICD-10-CM | POA: Diagnosis not present

## 2013-06-21 DIAGNOSIS — I4891 Unspecified atrial fibrillation: Secondary | ICD-10-CM | POA: Diagnosis not present

## 2013-06-21 DIAGNOSIS — I4729 Other ventricular tachycardia: Secondary | ICD-10-CM

## 2013-06-21 DIAGNOSIS — R0602 Shortness of breath: Secondary | ICD-10-CM

## 2013-06-21 DIAGNOSIS — I519 Heart disease, unspecified: Secondary | ICD-10-CM | POA: Diagnosis not present

## 2013-06-21 DIAGNOSIS — Q208 Other congenital malformations of cardiac chambers and connections: Secondary | ICD-10-CM

## 2013-06-21 DIAGNOSIS — Q249 Congenital malformation of heart, unspecified: Secondary | ICD-10-CM

## 2013-06-21 DIAGNOSIS — Z95810 Presence of automatic (implantable) cardiac defibrillator: Secondary | ICD-10-CM | POA: Diagnosis not present

## 2013-06-21 LAB — TSH: TSH: 0.83 u[IU]/mL (ref 0.35–5.50)

## 2013-06-21 LAB — MDC_IDC_ENUM_SESS_TYPE_INCLINIC
Battery Remaining Longevity: 80.4 mo
Brady Statistic RA Percent Paced: 98 %
Brady Statistic RV Percent Paced: 12 %
Date Time Interrogation Session: 20150325112457
HighPow Impedance: 51.3364
Implantable Pulse Generator Model: 2411
Implantable Pulse Generator Serial Number: 7136426
Lead Channel Impedance Value: 425 Ohm
Lead Channel Impedance Value: 437.5 Ohm
Lead Channel Pacing Threshold Amplitude: 1.25 V
Lead Channel Pacing Threshold Pulse Width: 0.5 ms
Lead Channel Pacing Threshold Pulse Width: 0.5 ms
Lead Channel Sensing Intrinsic Amplitude: 2.5 mV
Lead Channel Sensing Intrinsic Amplitude: 8.2 mV
Lead Channel Setting Pacing Pulse Width: 0.5 ms
MDC IDC MSMT LEADCHNL RV PACING THRESHOLD AMPLITUDE: 0.75 V
MDC IDC SET LEADCHNL RA PACING AMPLITUDE: 2.5 V
MDC IDC SET LEADCHNL RV PACING AMPLITUDE: 2.5 V
MDC IDC SET LEADCHNL RV SENSING SENSITIVITY: 0.5 mV
MDC IDC SET ZONE DETECTION INTERVAL: 350 ms
Zone Setting Detection Interval: 300 ms

## 2013-06-21 LAB — HEPATIC FUNCTION PANEL
ALBUMIN: 3.9 g/dL (ref 3.5–5.2)
ALT: 30 U/L (ref 0–53)
AST: 30 U/L (ref 0–37)
Alkaline Phosphatase: 56 U/L (ref 39–117)
Bilirubin, Direct: 0.1 mg/dL (ref 0.0–0.3)
Total Bilirubin: 0.9 mg/dL (ref 0.3–1.2)
Total Protein: 6.9 g/dL (ref 6.0–8.3)

## 2013-06-21 LAB — BRAIN NATRIURETIC PEPTIDE: Pro B Natriuretic peptide (BNP): 37 pg/mL (ref 0.0–100.0)

## 2013-06-21 MED ORDER — DABIGATRAN ETEXILATE MESYLATE 150 MG PO CAPS: ORAL_CAPSULE | ORAL | Status: DC

## 2013-06-21 NOTE — Progress Notes (Signed)
PCP: Lilian Coma, MD  Robert Ochoa is a 68 y.o. male who presents today for routine electrophysiology followup.  Since his generator change, the patient reports doing very well.  Today, he denies symptoms of palpitations, chest pain, shortness of breath,  lower extremity edema, dizziness, presyncope, syncope, or ICD shocks.  The patient is otherwise without complaint today.   Past Medical History  Diagnosis Date  . Ventricular tachycardia     ICD discharges 05/2011 for this & AF-RVR - started on amiodarone then.  . Arrhythmogenic right ventricular dysplasia     Hx of VT s/p dual-chamber St. Jude ICD. Cardiac MRI 2010 showed moderately dilated RV with hypokinesis, LV EF 47%. EF 50-55% 2010 but down to 45% 05/2011. Cath 01/2009 without significant CAD.  [name] Atrial fibrillation or flutter     Paroxysmal. Started on Pradaxa 05/2011.  . Cardiac defibrillator in place   . Anxiety   . Septic shock(785.52) 10-27-11    with associated respiratory/renal failure/PAF/bradycardia  . LV dysfunction     per echo 10/2011; EF 35%   Past Surgical History  Procedure Laterality Date  . Appendectomy    . Cardiac defibrillator placement  02/19/09     St,. Jude   . Implantable cardioverter defibrillator generator change  03/01/13    for premature battery depletetion.  Now has a SJM Ellipse DR device placed by Dr Caryl Comes    Current Outpatient Prescriptions  Medication Sig Dispense Refill  . amiodarone (PACERONE) 200 MG tablet Take 1 tablet by mouth once daily      . Calcium-Magnesium-Zinc (CAL-MAG-ZINC PO) Take 2 tablets by mouth daily.       . dabigatran (PRADAXA) 150 MG CAPS capsule TAKE 1 CAPSULE BY MOUTH EVERY 12 HOURS  60 capsule  3  . lisinopril (PRINIVIL,ZESTRIL) 10 MG tablet TAKE 2 TABLETS BY MOUTH ONCE DAILY  90 tablet  3  . metoprolol tartrate (LOPRESSOR) 25 MG tablet Take 3 tablets (75 mg total) by mouth 2 (two) times daily.  180 tablet  3  . Omega-3 Fatty Acids (FISH OIL) 1000 MG CAPS Take 3  capsules by mouth daily.       . vitamin C (ASCORBIC ACID) 500 MG tablet Take 1,000 mg by mouth daily.        No current facility-administered medications for this visit.    Physical Exam: Filed Vitals:   06/21/13 0953  BP: 126/82  Pulse: 72  Height: 6\' 2"  (1.88 m)  Weight: 225 lb 6.4 oz (102.241 kg)    GEN- The patient is well appearing, alert and oriented x 3 today.   Head- normocephalic, atraumatic Eyes-  Sclera clear, conjunctiva pink Ears- hearing intact Oropharynx- clear Lungs- Clear to ausculation bilaterally, normal work of breathing Chest- ICD pocket is well healed Heart- Regular rate and rhythm, no murmurs, rubs or gallops, PMI not laterally displaced GI- soft, NT, ND, + BS Extremities- no clubbing, cyanosis, or edema  ICD interrogation- reviewed in detail today,  See PACEART report ekg today reveals atrial pacing 60 bpm, nonspecific ST/T changes (unchanged)  Assessment and Plan:  1.  VT Normal ICD function today See Pace Art report No changes today Continue amiodarone LFTs/ TFTs  2. AFib Well controlled No changes Continue pradaxa  3. Chronic systolic dysfunction Continue lisinopril Consider repeat echo upon return I will enroll in the ICM device clinic with [name]  Will followup with [name] in 6 months  Follow-up with me in 12 months

## 2013-06-21 NOTE — Patient Instructions (Addendum)
Your physician wants you to follow-up in:  6 months with Truitt Merle, NP and Dr. Rayann Heman in 12 months.  You will receive a reminder letter in the mail two months in advance. If you don't receive a letter, please call our office to schedule the follow-up appointment.  Remote monitoring is used to monitor your Pacemaker of ICD from home. This monitoring reduces the number of office visits required to check your device to one time per year. It allows [place] to keep an eye on the functioning of your device to ensure it is working properly. You are scheduled for a device check from home on  September 25, 2013. You may send your transmission at any time that day. If you have a wireless device, the transmission will be sent automatically. After your physician reviews your transmission, you will receive a postcard with your next transmission date.

## 2013-07-03 ENCOUNTER — Other Ambulatory Visit: Payer: Self-pay | Admitting: *Deleted

## 2013-07-03 MED ORDER — METOPROLOL TARTRATE 25 MG PO TABS: 75.0000 mg | ORAL_TABLET | Freq: Two times a day (BID) | ORAL | Status: DC

## 2013-07-04 ENCOUNTER — Other Ambulatory Visit: Payer: Self-pay | Admitting: *Deleted

## 2013-07-04 MED ORDER — LISINOPRIL 10 MG PO TABS
ORAL_TABLET | ORAL | Status: DC
Start: 2013-07-04 — End: 2013-12-23

## 2013-07-31 ENCOUNTER — Encounter: Payer: Self-pay | Admitting: *Deleted

## 2013-08-07 ENCOUNTER — Encounter: Payer: Self-pay | Admitting: *Deleted

## 2013-08-07 ENCOUNTER — Ambulatory Visit (INDEPENDENT_AMBULATORY_CARE_PROVIDER_SITE_OTHER): Payer: Medicare Other | Admitting: *Deleted

## 2013-08-07 DIAGNOSIS — I519 Heart disease, unspecified: Secondary | ICD-10-CM | POA: Diagnosis not present

## 2013-08-07 DIAGNOSIS — Z95810 Presence of automatic (implantable) cardiac defibrillator: Secondary | ICD-10-CM | POA: Diagnosis not present

## 2013-08-07 NOTE — Progress Notes (Signed)
EPIC Encounter for ICM Monitoring  Patient Name: Robert Ochoa is a 68 y.o. male Date: 08/07/2013 Primary Care Physican: Lilian Coma, MD Primary Cardiologist: Allred Electrophysiologist: Allred Dry Weight: 220 lbs       In the past month, have you:  1. Gained more than 2 pounds in a day or more than 5 pounds in a week? no  2. Had changes in your medications (with verification of current medications)? no  3. Had more shortness of breath than is usual for you? no  4. Limited your activity because of shortness of breath? no  5. Not been able to sleep because of shortness of breath? no  6. Had increased swelling in your feet or ankles? no  7. Had symptoms of dehydration (dizziness, dry mouth, increased thirst, decreased urine output) no  8. Had changes in sodium restriction? no  9. Been compliant with medication? Yes   ICM trend:   Follow-up plan: ICM clinic phone appointment: 09/22/13 (full transmission)  Copy of note sent to patient's primary care physician, primary cardiologist, and device following physician.  Emily Filbert, RN, BSN 08/07/2013 10:43 AM

## 2013-09-01 DIAGNOSIS — L820 Inflamed seborrheic keratosis: Secondary | ICD-10-CM | POA: Diagnosis not present

## 2013-09-01 DIAGNOSIS — Z85828 Personal history of other malignant neoplasm of skin: Secondary | ICD-10-CM | POA: Diagnosis not present

## 2013-09-01 DIAGNOSIS — L821 Other seborrheic keratosis: Secondary | ICD-10-CM | POA: Diagnosis not present

## 2013-09-01 DIAGNOSIS — I789 Disease of capillaries, unspecified: Secondary | ICD-10-CM | POA: Diagnosis not present

## 2013-09-01 DIAGNOSIS — D485 Neoplasm of uncertain behavior of skin: Secondary | ICD-10-CM | POA: Diagnosis not present

## 2013-09-01 DIAGNOSIS — L570 Actinic keratosis: Secondary | ICD-10-CM | POA: Diagnosis not present

## 2013-09-28 ENCOUNTER — Ambulatory Visit (INDEPENDENT_AMBULATORY_CARE_PROVIDER_SITE_OTHER): Payer: Medicare Other | Admitting: *Deleted

## 2013-09-28 DIAGNOSIS — I4729 Other ventricular tachycardia: Secondary | ICD-10-CM | POA: Diagnosis not present

## 2013-09-28 DIAGNOSIS — I472 Ventricular tachycardia: Secondary | ICD-10-CM | POA: Diagnosis not present

## 2013-09-28 NOTE — Progress Notes (Signed)
Remote ICD transmission.   

## 2013-10-09 LAB — MDC_IDC_ENUM_SESS_TYPE_REMOTE
Battery Voltage: 3.13 V
Brady Statistic AP VP Percent: 13 %
Brady Statistic AP VS Percent: 84 %
Brady Statistic AS VP Percent: 1 %
Brady Statistic AS VS Percent: 1.1 %
HighPow Impedance: 51 Ohm
HighPow Impedance: 51 Ohm
Implantable Pulse Generator Model: 2411
Lead Channel Impedance Value: 430 Ohm
Lead Channel Pacing Threshold Amplitude: 1.25 V
Lead Channel Pacing Threshold Pulse Width: 0.5 ms
Lead Channel Sensing Intrinsic Amplitude: 2.3 mV
Lead Channel Setting Pacing Amplitude: 2.5 V
Lead Channel Setting Pacing Pulse Width: 0.5 ms
Lead Channel Setting Sensing Sensitivity: 0.5 mV
MDC IDC MSMT BATTERY REMAINING LONGEVITY: 76 mo
MDC IDC MSMT BATTERY REMAINING PERCENTAGE: 91 %
MDC IDC MSMT LEADCHNL RA PACING THRESHOLD PULSEWIDTH: 0.5 ms
MDC IDC MSMT LEADCHNL RV IMPEDANCE VALUE: 440 Ohm
MDC IDC MSMT LEADCHNL RV PACING THRESHOLD AMPLITUDE: 0.75 V
MDC IDC MSMT LEADCHNL RV SENSING INTR AMPL: 6.9 mV
MDC IDC PG SERIAL: 7136426
MDC IDC SESS DTM: 20150702100017
MDC IDC SET LEADCHNL RV PACING AMPLITUDE: 2.5 V
MDC IDC STAT BRADY RA PERCENT PACED: 92 %
MDC IDC STAT BRADY RV PERCENT PACED: 13 %
Zone Setting Detection Interval: 300 ms
Zone Setting Detection Interval: 350 ms

## 2013-10-16 ENCOUNTER — Encounter: Payer: Self-pay | Admitting: *Deleted

## 2013-10-18 ENCOUNTER — Encounter: Payer: Self-pay | Admitting: Cardiology

## 2013-10-30 ENCOUNTER — Telehealth: Payer: Self-pay | Admitting: *Deleted

## 2013-10-30 ENCOUNTER — Ambulatory Visit (INDEPENDENT_AMBULATORY_CARE_PROVIDER_SITE_OTHER): Payer: Medicare Other | Admitting: *Deleted

## 2013-10-30 ENCOUNTER — Encounter: Payer: Self-pay | Admitting: *Deleted

## 2013-10-30 DIAGNOSIS — Z95810 Presence of automatic (implantable) cardiac defibrillator: Secondary | ICD-10-CM | POA: Diagnosis not present

## 2013-10-30 DIAGNOSIS — I519 Heart disease, unspecified: Secondary | ICD-10-CM | POA: Diagnosis not present

## 2013-10-30 NOTE — Telephone Encounter (Signed)
ICM transmission received. I left a message for the patient to call. 

## 2013-10-30 NOTE — Telephone Encounter (Signed)
I spoke with the patient. 

## 2013-10-30 NOTE — Telephone Encounter (Signed)
I left a message for the patient I will try him again before I leave today.

## 2013-10-30 NOTE — Telephone Encounter (Signed)
Follow Up ° °Pt returning call from earlier. Please call. °

## 2013-10-30 NOTE — Progress Notes (Signed)
EPIC Encounter for ICM Monitoring  Patient Name: Robert Ochoa is a 68 y.o. male Date: 10/30/2013 Primary Care Physican: Lilian Coma, MD Primary Cardiologist: Allred Electrophysiologist: Allred Dry Weight: 220 lbs       In the past month, have you:  1. Gained more than 2 pounds in a day or more than 5 pounds in a week? no  2. Had changes in your medications (with verification of current medications)? no  3. Had more shortness of breath than is usual for you? no  4. Limited your activity because of shortness of breath? no  5. Not been able to sleep because of shortness of breath? no  6. Had increased swelling in your feet or ankles? no  7. Had symptoms of dehydration (dizziness, dry mouth, increased thirst, decreased urine output) no  8. Had changes in sodium restriction? no  9. Been compliant with medication? Yes   ICM trend:   Follow-up plan: ICM clinic phone appointment: 11/30/13  Copy of note sent to patient's primary care physician, primary cardiologist, and device following physician.  [name], RN, BSN 10/30/2013 4:55 PM

## 2013-10-31 ENCOUNTER — Other Ambulatory Visit: Payer: Self-pay

## 2013-10-31 MED ORDER — METOPROLOL TARTRATE 25 MG PO TABS: 75.0000 mg | ORAL_TABLET | Freq: Two times a day (BID) | ORAL | Status: DC

## 2013-11-23 ENCOUNTER — Encounter: Payer: Self-pay | Admitting: Internal Medicine

## 2013-11-30 ENCOUNTER — Encounter: Payer: Self-pay | Admitting: *Deleted

## 2013-11-30 ENCOUNTER — Ambulatory Visit (INDEPENDENT_AMBULATORY_CARE_PROVIDER_SITE_OTHER): Payer: Medicare Other | Admitting: *Deleted

## 2013-11-30 DIAGNOSIS — Z95810 Presence of automatic (implantable) cardiac defibrillator: Secondary | ICD-10-CM | POA: Diagnosis not present

## 2013-11-30 DIAGNOSIS — I519 Heart disease, unspecified: Secondary | ICD-10-CM | POA: Diagnosis not present

## 2013-11-30 NOTE — Progress Notes (Signed)
EPIC Encounter for ICM Monitoring  Patient Name: Robert Ochoa is a 68 y.o. male Date: 11/30/2013 Primary Care Physican: Lilian Coma, MD Primary Cardiologist: Allred/ Truitt Merle, NP Electrophysiologist: Allred Dry Weight: 220 lbs       In the past month, have you:  1. Gained more than 2 pounds in a day or more than 5 pounds in a week? no  2. Had changes in your medications (with verification of current medications)? no  3. Had more shortness of breath than is usual for you? no  4. Limited your activity because of shortness of breath? no  5. Not been able to sleep because of shortness of breath? no  6. Had increased swelling in your feet or ankles? no  7. Had symptoms of dehydration (dizziness, dry mouth, increased thirst, decreased urine output) no  8. Had changes in sodium restriction? no  9. Been compliant with medication? Yes   ICM trend:   Follow-up plan: ICM clinic phone appointment: 01/01/14- full transmission  Copy of note sent to patient's primary care physician, primary cardiologist, and device following physician.  [name], RN, BSN 11/30/2013 3:47 PM

## 2013-12-05 ENCOUNTER — Telehealth: Payer: Self-pay

## 2013-12-05 NOTE — Telephone Encounter (Signed)
Patient call to get samples of pradaxa 150 mg gave to him at front desk

## 2013-12-09 ENCOUNTER — Other Ambulatory Visit: Payer: Self-pay | Admitting: Internal Medicine

## 2013-12-23 ENCOUNTER — Other Ambulatory Visit: Payer: Self-pay | Admitting: Internal Medicine

## 2013-12-29 DIAGNOSIS — Z23 Encounter for immunization: Secondary | ICD-10-CM | POA: Diagnosis not present

## 2014-01-01 ENCOUNTER — Encounter: Payer: Self-pay | Admitting: Internal Medicine

## 2014-01-01 ENCOUNTER — Ambulatory Visit (INDEPENDENT_AMBULATORY_CARE_PROVIDER_SITE_OTHER): Payer: Medicare Other | Admitting: *Deleted

## 2014-01-01 ENCOUNTER — Telehealth: Payer: Self-pay | Admitting: Cardiology

## 2014-01-01 DIAGNOSIS — I4729 Other ventricular tachycardia: Secondary | ICD-10-CM

## 2014-01-01 DIAGNOSIS — I472 Ventricular tachycardia: Secondary | ICD-10-CM

## 2014-01-01 DIAGNOSIS — Z95810 Presence of automatic (implantable) cardiac defibrillator: Secondary | ICD-10-CM

## 2014-01-01 DIAGNOSIS — I519 Heart disease, unspecified: Secondary | ICD-10-CM | POA: Diagnosis not present

## 2014-01-01 LAB — MDC_IDC_ENUM_SESS_TYPE_REMOTE
Battery Remaining Longevity: 73 mo
Battery Remaining Percentage: 89 %
Brady Statistic AP VP Percent: 14 %
Brady Statistic AS VP Percent: 1 %
Brady Statistic AS VS Percent: 1.2 %
Brady Statistic RA Percent Paced: 94 %
Brady Statistic RV Percent Paced: 14 %
Date Time Interrogation Session: 20151005060018
HIGH POWER IMPEDANCE MEASURED VALUE: 51 Ohm
HighPow Impedance: 51 Ohm
Implantable Pulse Generator Model: 2411
Implantable Pulse Generator Serial Number: 7136426
Lead Channel Impedance Value: 440 Ohm
Lead Channel Pacing Threshold Amplitude: 0.75 V
Lead Channel Pacing Threshold Pulse Width: 0.5 ms
Lead Channel Pacing Threshold Pulse Width: 0.5 ms
Lead Channel Sensing Intrinsic Amplitude: 9 mV
Lead Channel Setting Pacing Amplitude: 2.5 V
Lead Channel Setting Sensing Sensitivity: 0.5 mV
MDC IDC MSMT BATTERY VOLTAGE: 3.05 V
MDC IDC MSMT LEADCHNL RA PACING THRESHOLD AMPLITUDE: 1.25 V
MDC IDC MSMT LEADCHNL RA SENSING INTR AMPL: 2.8 mV
MDC IDC MSMT LEADCHNL RV IMPEDANCE VALUE: 430 Ohm
MDC IDC SET LEADCHNL RA PACING AMPLITUDE: 2.5 V
MDC IDC SET LEADCHNL RV PACING PULSEWIDTH: 0.5 ms
MDC IDC SET ZONE DETECTION INTERVAL: 350 ms
MDC IDC STAT BRADY AP VS PERCENT: 83 %
Zone Setting Detection Interval: 300 ms

## 2014-01-01 NOTE — Telephone Encounter (Signed)
LMOVM reminding pt to send remote transmission.   

## 2014-01-02 ENCOUNTER — Encounter: Payer: Self-pay | Admitting: *Deleted

## 2014-01-02 DIAGNOSIS — I472 Ventricular tachycardia: Secondary | ICD-10-CM

## 2014-01-02 DIAGNOSIS — I519 Heart disease, unspecified: Secondary | ICD-10-CM | POA: Diagnosis not present

## 2014-01-02 DIAGNOSIS — Z95810 Presence of automatic (implantable) cardiac defibrillator: Secondary | ICD-10-CM | POA: Diagnosis not present

## 2014-01-02 NOTE — Progress Notes (Signed)
EPIC Encounter for ICM Monitoring  Patient Name: Robert Ochoa is a 68 y.o. male Date: 01/02/2014 Primary Care Physican: Lilian Coma, MD Primary Cardiologist: Allred/ Truitt Merle, NP Electrophysiologist: Allred Dry Weight: 220 lbs       In the past month, have you:  1. Gained more than 2 pounds in a day or more than 5 pounds in a week? no  2. Had changes in your medications (with verification of current medications)? no  3. Had more shortness of breath than is usual for you? no  4. Limited your activity because of shortness of breath? no  5. Not been able to sleep because of shortness of breath? no  6. Had increased swelling in your feet or ankles? no  7. Had symptoms of dehydration (dizziness, dry mouth, increased thirst, decreased urine output) no  8. Had changes in sodium restriction? no  9. Been compliant with medication? Yes   ICM trend:   Follow-up plan: ICM clinic phone appointment: 02/01/14  Copy of note sent to patient's primary care physician, primary cardiologist, and device following physician.  [name], RN, BSN 01/02/2014 3:42 PM

## 2014-01-03 NOTE — Progress Notes (Signed)
Remote ICD transmission.   

## 2014-01-08 ENCOUNTER — Encounter: Payer: Self-pay | Admitting: Cardiology

## 2014-01-08 ENCOUNTER — Ambulatory Visit: Payer: Medicare Other | Admitting: Nurse Practitioner

## 2014-01-15 ENCOUNTER — Telehealth: Payer: Self-pay | Admitting: Internal Medicine

## 2014-01-15 ENCOUNTER — Telehealth: Payer: Self-pay | Admitting: *Deleted

## 2014-01-15 DIAGNOSIS — J101 Influenza due to other identified influenza virus with other respiratory manifestations: Secondary | ICD-10-CM | POA: Diagnosis not present

## 2014-01-15 NOTE — Telephone Encounter (Signed)
Pradaxa samples placed at the front desk for patietn.

## 2014-01-15 NOTE — Telephone Encounter (Signed)
If he feels like he has the flu - I would prefer that he reschedule his visit with me - looks like this was just a routine follow up.   Thanks

## 2014-01-15 NOTE — Telephone Encounter (Signed)
New Message  Pt called states that he has the flu. He says he doesn't feel bad at all but he would like a call back to determine if he can or cannot come in//sr

## 2014-01-15 NOTE — Telephone Encounter (Signed)
error 

## 2014-01-15 NOTE — Telephone Encounter (Signed)
S/w pt agreeable to plan will come in on October 27 and cancelled tomorrows appointment due to flu.

## 2014-01-16 ENCOUNTER — Ambulatory Visit: Payer: Medicare Other | Admitting: Nurse Practitioner

## 2014-01-23 ENCOUNTER — Ambulatory Visit
Admission: RE | Admit: 2014-01-23 | Discharge: 2014-01-23 | Disposition: A | Discharge status: Home / Self Care | Payer: Medicare Other | Source: Ambulatory Visit | Attending: Nurse Practitioner | Admitting: Nurse Practitioner

## 2014-01-23 ENCOUNTER — Encounter: Payer: Self-pay | Admitting: Nurse Practitioner

## 2014-01-23 ENCOUNTER — Ambulatory Visit (INDEPENDENT_AMBULATORY_CARE_PROVIDER_SITE_OTHER): Payer: Medicare Other | Admitting: Nurse Practitioner

## 2014-01-23 VITALS — BP 130/84 | HR 62 | Ht 74.0 in | Wt 231.8 lb

## 2014-01-23 DIAGNOSIS — E785 Hyperlipidemia, unspecified: Secondary | ICD-10-CM

## 2014-01-23 DIAGNOSIS — J449 Chronic obstructive pulmonary disease, unspecified: Secondary | ICD-10-CM | POA: Diagnosis not present

## 2014-01-23 DIAGNOSIS — I480 Paroxysmal atrial fibrillation: Secondary | ICD-10-CM | POA: Diagnosis not present

## 2014-01-23 DIAGNOSIS — Z7901 Long term (current) use of anticoagulants: Secondary | ICD-10-CM

## 2014-01-23 NOTE — Progress Notes (Signed)
Robert Ochoa Date of Birth: 15-Nov-1945 Medical Record #[id number]  History of Present Illness: [name] is seen back today for a 6 month check. Seen for Dr. Rayann Ochoa. He is a 68 year old male with multiple medical issues which include past pneumonia with associated sepsis, renal failure and respiratory failure back in 2013. Has atrial fib. Has an ICD/pacemaker in place with a set lower rate of 60. Last EF was 35 to 40% back in October of 2013 and increased to 45 to 50% per echo from October of 2014. He is managed medically. His other issues include right ventricular dysplasia, ICD implant, PAF and paroxysmal VT. He remains on chronic amiodarone and chronic Pradaxa. Last PFT was March of 2014.   He was last seen here by Dr. Rayann Ochoa back in March. Was doing ok. Had had his ICD generator changed out without incident.   Ochoa in today. Here alone. He is doing ok. He was going to be seen last week - but called and thought he might have had the flu - appointment was changed - he went to his PCP - he did test positive for the flu - was treated with Tamiflu - he has improved clinically. Still with some cough but improving. He has otherwise been doing ok. No chest pain. Breathing is good. No falls. He is overdue for his surveillance labs. Notes that his weight is up some - no swelling. Admits that he is eating too much.   Current Outpatient Prescriptions  Medication Sig Dispense Refill  . amiodarone (PACERONE) 200 MG tablet take 1 tablet by mouth once daily  90 tablet  0  . Calcium-Magnesium-Zinc (CAL-MAG-ZINC PO) Take 2 tablets by mouth daily.       . dabigatran (PRADAXA) 150 MG CAPS capsule TAKE 1 CAPSULE BY MOUTH EVERY 12 HOURS  180 capsule  3  . Dextromethorphan HBr (COUGH RELIEF PO) Take by mouth as needed (for flu symptoms).      [name] lisinopril (PRINIVIL,ZESTRIL) 10 MG tablet TAKE 2 TABLETS BY MOUTH ONCE DAILY  180 tablet  0  . metoprolol tartrate (LOPRESSOR) 25 MG tablet Take 3 tablets (75 mg total)  by mouth 2 (two) times daily.  180 tablet  6  . Omega-3 Fatty Acids (FISH OIL) 1000 MG CAPS Take 3 capsules by mouth daily.       . vitamin C (ASCORBIC ACID) 500 MG tablet Take 1,000 mg by mouth daily.        No current facility-administered medications for this visit.    No Known Allergies  Past Medical History  Diagnosis Date  . Ventricular tachycardia     ICD discharges 05/2011 for this & AF-RVR - started on amiodarone then.  . Arrhythmogenic right ventricular dysplasia     Hx of VT s/p dual-chamber St. Jude ICD. Cardiac MRI 2010 showed moderately dilated RV with hypokinesis, LV EF 47%. EF 50-55% 2010 but down to 45% 05/2011. Cath 01/2009 without significant CAD.  [name] Atrial fibrillation or flutter     Paroxysmal. Started on Pradaxa 05/2011.  . Cardiac defibrillator in place   . Anxiety   . Septic shock(785.52) 10-27-11    with associated respiratory/renal failure/PAF/bradycardia  . LV dysfunction     per echo 10/2011; EF 35%    Past Surgical History  Procedure Laterality Date  . Appendectomy    . Cardiac defibrillator placement  02/19/09     St,. Jude   . Implantable cardioverter defibrillator generator change  03/01/13  for premature battery depletetion.  Now has a SJM Ellipse DR device placed by Dr Robert Ochoa    History  Smoking status  . Never Smoker   Smokeless tobacco  . Never Used    History  Alcohol Use  . Yes    Comment: social--3 beers per week    Family History  Problem Relation Age of Onset  . Heart attack Father   . Allergies Mother     Review of Systems: The review of systems is per the HPI.  All other systems were reviewed and are negative.  Physical Exam: BP 130/84  Pulse 62  Ht 6\' 2"  (1.88 m)  Wt 231 lb 12.8 oz (105.144 kg)  BMI 29.75 kg/m2  SpO2 94% Patient is very pleasant and in no acute distress. He is obese. Weight is up - up 11 pounds since December. Skin is warm and dry. Color is normal.  HEENT is unremarkable. Normocephalic/atraumatic.  PERRL. Sclera are nonicteric. Neck is supple. No masses. No JVD. Lungs are clear. Cardiac exam shows a regular rate and rhythm. Abdomen is soft. Extremities are without edema. Gait and ROM are intact. No gross neurologic deficits noted.  Wt Readings from Last 3 Encounters:  01/23/14 231 lb 12.8 oz (105.144 kg)  06/21/13 225 lb 6.4 oz (102.241 kg)  03/01/13 220 lb (99.791 kg)    LABORATORY DATA/PROCEDURES:  Lab Results  Component Value Date   WBC 5.1 03/01/2013   HGB 15.4 03/01/2013   HCT 45.8 03/01/2013   PLT 160 03/01/2013   GLUCOSE 97 03/01/2013   CHOL  Value: 146        ATP III CLASSIFICATION:  <200     mg/dL   Desirable  200-239  mg/dL   Borderline High  >=240    mg/dL   High        02/15/2009   TRIG 48 02/15/2009   HDL 43 02/15/2009   LDLCALC  Value: 93        Total Cholesterol/HDL:CHD Risk Coronary Heart Disease Risk Table                     Men   Women  1/2 Average Risk   3.4   3.3  Average Risk       5.0   4.4  2 X Average Risk   9.6   7.1  3 X Average Risk  23.4   11.0        Use the calculated Patient Ratio above and the CHD Risk Table to determine the patient's CHD Risk.        ATP III CLASSIFICATION (LDL):  <100     mg/dL   Optimal  100-129  mg/dL   Near or Above                    Optimal  130-159  mg/dL   Borderline  160-189  mg/dL   High  >190     mg/dL   Very High 02/15/2009   ALT 30 06/21/2013   AST 30 06/21/2013   NA 140 03/01/2013   K 4.2 03/01/2013   CL 107 03/01/2013   CREATININE 1.14 03/01/2013   BUN 19 03/01/2013   CO2 22 03/01/2013   TSH 0.83 06/21/2013   INR 1.18 10/30/2011    BNP (last 3 results)  Recent Labs  06/21/13 1102  PROBNP 37.0   Echo Study Conclusions from October 2014 - Left ventricle: The cavity size was normal. Wall  thickness was normal. Systolic function was mildly reduced. The estimated ejection fraction was in the range of 45% to 50%. Diffuse hypokinesis. Doppler parameters are consistent with abnormal left ventricular relaxation (grade 1  diastolic dysfunction). - Left atrium: The atrium was mildly dilated. - Right ventricle: The cavity size was moderately to severely dilated. Systolic function was moderately reduced. - Right atrium: The atrium was moderately dilated. - Atrial septum: There was an atrial septal aneurysm. - Pulmonary arteries: PA peak pressure: 63mm Hg (S).    Assessment / Plan: 1. Systolic heart failure/nonischemic CM - EF is 45 to 50%. Looks compensated. No change in current regimen.  2. ARVD - has his ICD in place   3. PAF - on amiodarone - needs surveillance labs - will come back fasting later this week - send for CXR today as well. He has had his PFTS earlier this year  4. Chronic anticoagulation - on Pradaxa - Samples given today.   See back in December for his device check.   Patient is agreeable to this plan and will call if any problems develop in the interim.   Burtis Junes, RN, Painter [address] [place], [place]  [phone]

## 2014-01-23 NOTE — Patient Instructions (Addendum)
Fasting labs in the next one to two weeks  -  BMET, CBC, TSH, Lipids and LFTS  Please go to [facility] to [place] on the first floor for a chest Xray - you may walk in.   See Dr. Rayann Heman in January - this will be for your device check.   Call the [place] office at [phone] if you have any questions, problems or concerns.

## 2014-01-25 ENCOUNTER — Other Ambulatory Visit (INDEPENDENT_AMBULATORY_CARE_PROVIDER_SITE_OTHER): Payer: Medicare Other | Admitting: *Deleted

## 2014-01-25 DIAGNOSIS — E785 Hyperlipidemia, unspecified: Secondary | ICD-10-CM | POA: Diagnosis not present

## 2014-01-25 DIAGNOSIS — Z7901 Long term (current) use of anticoagulants: Secondary | ICD-10-CM

## 2014-01-25 DIAGNOSIS — I480 Paroxysmal atrial fibrillation: Secondary | ICD-10-CM

## 2014-01-25 LAB — CBC
HCT: 46.6 % (ref 39.0–52.0)
Hemoglobin: 15 g/dL (ref 13.0–17.0)
MCHC: 32.3 g/dL (ref 30.0–36.0)
MCV: 87.8 fl (ref 78.0–100.0)
Platelets: 269 10*3/uL (ref 150.0–400.0)
RBC: 5.31 Mil/uL (ref 4.22–5.81)
RDW: 14 % (ref 11.5–15.5)
WBC: 7.9 10*3/uL (ref 4.0–10.5)

## 2014-01-25 LAB — BASIC METABOLIC PANEL
BUN: 21 mg/dL (ref 6–23)
CO2: 23 mEq/L (ref 19–32)
Calcium: 8.8 mg/dL (ref 8.4–10.5)
Chloride: 104 mEq/L (ref 96–112)
Creatinine, Ser: 1.4 mg/dL (ref 0.4–1.5)
GFR: 52.52 mL/min — ABNORMAL LOW (ref >60.00)
Glucose, Bld: 88 mg/dL (ref 70–99)
Potassium: 4.5 mEq/L (ref 3.5–5.1)
Sodium: 134 mEq/L — ABNORMAL LOW (ref 135–145)

## 2014-01-25 LAB — LIPID PANEL
Cholesterol: 161 mg/dL (ref 0–200)
HDL: 30.6 mg/dL — ABNORMAL LOW (ref >39.00)
LDL Cholesterol: 115 mg/dL — ABNORMAL HIGH (ref 0–99)
NonHDL: 130.4
Total CHOL/HDL Ratio: 5
Triglycerides: 76 mg/dL (ref 0.0–149.0)
VLDL: 15.2 mg/dL (ref 0.0–40.0)

## 2014-01-25 LAB — HEPATIC FUNCTION PANEL
ALT: 31 U/L (ref 0–53)
AST: 28 U/L (ref 0–37)
Albumin: 3 g/dL — ABNORMAL LOW (ref 3.5–5.2)
Alkaline Phosphatase: 61 U/L (ref 39–117)
Bilirubin, Direct: 0 mg/dL (ref 0.0–0.3)
Total Bilirubin: 0.7 mg/dL (ref 0.2–1.2)
Total Protein: 7 g/dL (ref 6.0–8.3)

## 2014-01-25 LAB — TSH: TSH: 0.61 u[IU]/mL (ref 0.35–4.50)

## 2014-01-26 ENCOUNTER — Telehealth: Payer: Self-pay

## 2014-01-26 NOTE — Telephone Encounter (Signed)
pt aware of lab results.Ok to report. Labs are stable. No change with current regimen.pt verbalized understanding.

## 2014-01-26 NOTE — Telephone Encounter (Signed)
called to give pt lab results.lmtcb

## 2014-01-26 NOTE — Telephone Encounter (Signed)
Message copied by Lamar Laundry on Fri Jan 26, 2014  8:32 AM ------      Message from: Burtis Junes      Created: Fri Jan 26, 2014  7:48 AM       Ok to report. Labs are stable. No change with current regimen. ------

## 2014-01-26 NOTE — Telephone Encounter (Signed)
F/u ° ° °Pt returning your call °

## 2014-01-26 NOTE — Telephone Encounter (Signed)
F/u   Pt waiting on call from  Nurse.

## 2014-02-01 ENCOUNTER — Encounter: Payer: Self-pay | Admitting: *Deleted

## 2014-02-01 ENCOUNTER — Ambulatory Visit (INDEPENDENT_AMBULATORY_CARE_PROVIDER_SITE_OTHER): Payer: Medicare Other | Admitting: *Deleted

## 2014-02-01 ENCOUNTER — Telehealth: Payer: Self-pay | Admitting: *Deleted

## 2014-02-01 DIAGNOSIS — I519 Heart disease, unspecified: Secondary | ICD-10-CM

## 2014-02-01 DIAGNOSIS — Z95810 Presence of automatic (implantable) cardiac defibrillator: Secondary | ICD-10-CM | POA: Diagnosis not present

## 2014-02-01 NOTE — Telephone Encounter (Signed)
I spoke with the patient. 

## 2014-02-01 NOTE — Progress Notes (Signed)
EPIC Encounter for ICM Monitoring  Patient Name: Robert Ochoa is a 68 y.o. male Date: 02/01/2014 Primary Care Physican: Lilian Coma, MD Primary Cardiologist: Allred/ Truitt Merle, NP Electrophysiologist: Allred Dry Weight: 231 lbs       In the past month, have you:  1. Gained more than 2 pounds in a day or more than 5 pounds in a week? No. The patient's baseline weight has trended up about 6 lbs since he last saw [name]. He does not think he is eating more, but he is exercising less.   2. Had changes in your medications (with verification of current medications)? no  3. Had more shortness of breath than is usual for you? no  4. Limited your activity because of shortness of breath? no  5. Not been able to sleep because of shortness of breath? no  6. Had increased swelling in your feet or ankles? no  7. Had symptoms of dehydration (dizziness, dry mouth, increased thirst, decreased urine output) no  8. Had changes in sodium restriction? no  9. Been compliant with medication? Yes   ICM trend:   Follow-up plan: ICM clinic phone appointment: 03/05/14. The patient's corvue readings were elevated from ~ 10/17-10/23. This does correlate with the time that the patient had the flu. No other changes noted. He is doing well now. Will follow up in 1 month.  Copy of note sent to patient's primary care physician, primary cardiologist, and device following physician.  [name], RN, BSN 02/01/2014 11:40 AM

## 2014-02-01 NOTE — Telephone Encounter (Signed)
I attempted to call the patient back. I left a message I will try him again soon.

## 2014-02-01 NOTE — Telephone Encounter (Signed)
ICM transmission received. I left a message for the patient to call. 

## 2014-02-01 NOTE — Telephone Encounter (Signed)
Follow up ° ° ° ° ° °Returning Robert Ochoa's call °

## 2014-02-20 ENCOUNTER — Telehealth: Payer: Self-pay | Admitting: Internal Medicine

## 2014-02-20 ENCOUNTER — Telehealth: Payer: Self-pay | Admitting: *Deleted

## 2014-02-20 NOTE — Telephone Encounter (Deleted)
Error

## 2014-02-20 NOTE — Telephone Encounter (Signed)
Pradaxa samples placed at the front desk for patient. 

## 2014-03-05 ENCOUNTER — Encounter: Payer: Medicare Other | Admitting: *Deleted

## 2014-03-06 DIAGNOSIS — L718 Other rosacea: Secondary | ICD-10-CM | POA: Diagnosis not present

## 2014-03-06 DIAGNOSIS — Z85828 Personal history of other malignant neoplasm of skin: Secondary | ICD-10-CM | POA: Diagnosis not present

## 2014-03-06 DIAGNOSIS — L821 Other seborrheic keratosis: Secondary | ICD-10-CM | POA: Diagnosis not present

## 2014-03-06 DIAGNOSIS — L812 Freckles: Secondary | ICD-10-CM | POA: Diagnosis not present

## 2014-03-06 DIAGNOSIS — L570 Actinic keratosis: Secondary | ICD-10-CM | POA: Diagnosis not present

## 2014-03-06 DIAGNOSIS — L918 Other hypertrophic disorders of the skin: Secondary | ICD-10-CM | POA: Diagnosis not present

## 2014-03-06 DIAGNOSIS — B351 Tinea unguium: Secondary | ICD-10-CM | POA: Diagnosis not present

## 2014-03-08 ENCOUNTER — Encounter (HOSPITAL_COMMUNITY): Payer: Self-pay | Admitting: Internal Medicine

## 2014-03-09 ENCOUNTER — Telehealth: Payer: Self-pay | Admitting: *Deleted

## 2014-03-09 NOTE — Telephone Encounter (Signed)
ICM transmission received. I left a message for the patient to call. 

## 2014-03-13 ENCOUNTER — Telehealth: Payer: Self-pay | Admitting: *Deleted

## 2014-03-13 NOTE — Telephone Encounter (Signed)
Pradaxa samples placed at the front desk for patient. 

## 2014-03-16 ENCOUNTER — Other Ambulatory Visit: Payer: Self-pay | Admitting: Internal Medicine

## 2014-03-19 ENCOUNTER — Encounter: Payer: Self-pay | Admitting: *Deleted

## 2014-03-19 NOTE — Telephone Encounter (Signed)
No call back from the patient. Letter mailed to him.

## 2014-03-29 ENCOUNTER — Other Ambulatory Visit: Payer: Self-pay | Admitting: Internal Medicine

## 2014-04-06 ENCOUNTER — Encounter: Payer: Self-pay | Admitting: Internal Medicine

## 2014-04-06 ENCOUNTER — Ambulatory Visit (INDEPENDENT_AMBULATORY_CARE_PROVIDER_SITE_OTHER): Payer: Medicare Other | Admitting: Internal Medicine

## 2014-04-06 VITALS — BP 122/86 | HR 60 | Ht 74.0 in | Wt 228.6 lb

## 2014-04-06 DIAGNOSIS — I472 Ventricular tachycardia: Secondary | ICD-10-CM

## 2014-04-06 DIAGNOSIS — I480 Paroxysmal atrial fibrillation: Secondary | ICD-10-CM | POA: Diagnosis not present

## 2014-04-06 DIAGNOSIS — I4729 Other ventricular tachycardia: Secondary | ICD-10-CM

## 2014-04-06 DIAGNOSIS — I519 Heart disease, unspecified: Secondary | ICD-10-CM | POA: Diagnosis not present

## 2014-04-06 LAB — MDC_IDC_ENUM_SESS_TYPE_INCLINIC
Battery Remaining Longevity: 74.4 mo
Brady Statistic RA Percent Paced: 95 %
Date Time Interrogation Session: 20160108141551
HIGH POWER IMPEDANCE MEASURED VALUE: 54.2179
Implantable Pulse Generator Model: 2411
Implantable Pulse Generator Serial Number: 7136426
Lead Channel Pacing Threshold Amplitude: 0.75 V
Lead Channel Pacing Threshold Amplitude: 1 V
Lead Channel Pacing Threshold Pulse Width: 0.5 ms
Lead Channel Pacing Threshold Pulse Width: 0.5 ms
Lead Channel Sensing Intrinsic Amplitude: 2.6 mV
Lead Channel Sensing Intrinsic Amplitude: 8.5 mV
Lead Channel Setting Pacing Amplitude: 2.5 V
Lead Channel Setting Pacing Pulse Width: 0.5 ms
MDC IDC MSMT LEADCHNL RA IMPEDANCE VALUE: 487.5 Ohm
MDC IDC MSMT LEADCHNL RA PACING THRESHOLD AMPLITUDE: 1 V
MDC IDC MSMT LEADCHNL RA PACING THRESHOLD PULSEWIDTH: 0.5 ms
MDC IDC MSMT LEADCHNL RV IMPEDANCE VALUE: 437.5 Ohm
MDC IDC MSMT LEADCHNL RV PACING THRESHOLD AMPLITUDE: 0.75 V
MDC IDC MSMT LEADCHNL RV PACING THRESHOLD PULSEWIDTH: 0.5 ms
MDC IDC SET LEADCHNL RV PACING AMPLITUDE: 2.5 V
MDC IDC SET LEADCHNL RV SENSING SENSITIVITY: 0.5 mV
MDC IDC SET ZONE DETECTION INTERVAL: 350 ms
MDC IDC STAT BRADY RV PERCENT PACED: 14 %
Zone Setting Detection Interval: 300 ms

## 2014-04-06 NOTE — Progress Notes (Signed)
PCP: Lilian Coma, MD  Robert Ochoa is a 69 y.o. male who presents today for routine electrophysiology followup.  Since his last visit, the patient reports doing very well.  Today, he denies symptoms of palpitations, chest pain, shortness of breath,  lower extremity edema, dizziness, presyncope, syncope, or ICD shocks.  The patient is otherwise without complaint today.   Past Medical History  Diagnosis Date  . Ventricular tachycardia     ICD discharges 05/2011 for this & AF-RVR - started on amiodarone then.  . Arrhythmogenic right ventricular dysplasia     Hx of VT s/p dual-chamber St. Jude ICD. Cardiac MRI 2010 showed moderately dilated RV with hypokinesis, LV EF 47%. EF 50-55% 2010 but down to 45% 05/2011. Cath 01/2009 without significant CAD.  [name] Atrial fibrillation or flutter     Paroxysmal. Started on Pradaxa 05/2011.  . Cardiac defibrillator in place   . Anxiety   . Septic shock(785.52) 10-27-11    with associated respiratory/renal failure/PAF/bradycardia  . LV dysfunction     per echo 10/2011; EF 35%   Past Surgical History  Procedure Laterality Date  . Appendectomy    . Cardiac defibrillator placement  02/19/09     St,. Jude   . Implantable cardioverter defibrillator generator change  03/01/13    for premature battery depletetion.  Now has a SJM Ellipse DR device placed by Dr Caryl Comes  . Implantable cardioverter defibrillator (icd) generator change N/A 03/01/2013    Procedure: ICD GENERATOR CHANGE;  Surgeon: [name], MD;  Location: [facility] CATH LAB;  Service: Cardiovascular;  Laterality: N/A;    Current Outpatient Prescriptions  Medication Sig Dispense Refill  . amiodarone (PACERONE) 200 MG tablet take 1 tablet by mouth once daily 90 tablet 0  . Calcium-Magnesium-Zinc (CAL-MAG-ZINC PO) Take 2 tablets by mouth daily.     . dabigatran (PRADAXA) 150 MG CAPS capsule TAKE 1 CAPSULE BY MOUTH EVERY 12 HOURS 180 capsule 3  . Dextromethorphan HBr (COUGH RELIEF PO) Take by mouth as  needed (for flu symptoms).    [name] lisinopril (PRINIVIL,ZESTRIL) 10 MG tablet TAKE 2 TABLETS ONCE DAILY 180 tablet 0  . metoprolol tartrate (LOPRESSOR) 25 MG tablet Take 3 tablets (75 mg total) by mouth 2 (two) times daily. 180 tablet 6  . Omega-3 Fatty Acids (FISH OIL) 1000 MG CAPS Take 3 capsules by mouth daily.     . vitamin C (ASCORBIC ACID) 500 MG tablet Take 1,000 mg by mouth daily.      No current facility-administered medications for this visit.    Physical Exam: Filed Vitals:   04/06/14 1358  BP: 122/86  Pulse: 60  Height: 6\' 2"  (1.88 m)  Weight: 228 lb 9.6 oz (103.692 kg)    GEN- The patient is well appearing, alert and oriented x 3 today.   Head- normocephalic, atraumatic Eyes-  Sclera clear, conjunctiva pink Ears- hearing intact Oropharynx- clear Lungs- Clear to ausculation bilaterally, normal work of breathing Chest- ICD pocket is well healed Heart- Regular rate and rhythm, no murmurs, rubs or gallops, PMI not laterally displaced GI- soft, NT, ND, + BS Extremities- no clubbing, cyanosis, or edema  ICD interrogation- reviewed in detail today,  See PACEART report ekg today reveals atrial pacing 60 bpm, nonspecific ST/T changes (unchanged)  Assessment and Plan:  1.  VT Normal ICD function today See Pace Art report No changes today Continue amiodarone.  Today we discussed decreasing amiodarone to 100mg  daily.  Presently he is reluctant to do so.  He  may be more willing in the future if his arrhythmias remain controlled. Check PFTs (last were 3/14)  2. AFib Well controlled No changes Continue pradaxa  3. Chronic systolic dysfunction Continue lisinopril Consider repeat echo upon return to see [name] He is followed in the  [facility] device clinic with [name]  Will followup with [name] in 6 months  Follow-up with me in 12 months

## 2014-04-06 NOTE — Patient Instructions (Addendum)
Your physician wants you to follow-up in: 6 months with Robert Merle, NP and 12 months with Dr. Vallery Ochoa will receive a reminder letter in the mail two months in advance. If you don't receive a letter, please call our office to schedule the follow-up appointment.   Remote monitoring is used to monitor your Pacemaker or ICD from home. This monitoring reduces the number of office visits required to check your device to one time per year. It allows [place] to keep an eye on the functioning of your device to ensure it is working properly. You are scheduled for a device check from home on 07/09/14. You may send your transmission at any time that day. If you have a wireless device, the transmission will be sent automatically. After your physician reviews your transmission, you will receive a postcard with your next transmission date.   Your physician has recommended that you have a pulmonary function test. Pulmonary Function Tests are a group of tests that measure how well air moves in and out of your lungs.

## 2014-04-19 ENCOUNTER — Encounter: Payer: Self-pay | Admitting: Internal Medicine

## 2014-04-24 ENCOUNTER — Ambulatory Visit (INDEPENDENT_AMBULATORY_CARE_PROVIDER_SITE_OTHER): Payer: Medicare Other | Admitting: Internal Medicine

## 2014-04-24 DIAGNOSIS — I4729 Other ventricular tachycardia: Secondary | ICD-10-CM

## 2014-04-24 DIAGNOSIS — I472 Ventricular tachycardia: Secondary | ICD-10-CM

## 2014-04-24 DIAGNOSIS — I519 Heart disease, unspecified: Secondary | ICD-10-CM

## 2014-04-24 LAB — PULMONARY FUNCTION TEST
DL/VA % pred: 69 %
DL/VA: 3.37 ml/min/mmHg/L
DLCO UNC % PRED: 68 %
DLCO unc: 26.03 ml/min/mmHg
FEF 25-75 PRE: 2.9 L/s
FEF 25-75 Post: 3.5 L/sec
FEF2575-%CHANGE-POST: 20 %
FEF2575-%PRED-POST: 121 %
FEF2575-%PRED-PRE: 100 %
FEV1-%CHANGE-POST: 7 %
FEV1-%PRED-PRE: 98 %
FEV1-%Pred-Post: 106 %
FEV1-PRE: 3.76 L
FEV1-Post: 4.05 L
FEV1FVC-%CHANGE-POST: 0 %
FEV1FVC-%Pred-Pre: 99 %
FEV6-%Change-Post: 7 %
FEV6-%PRED-POST: 112 %
FEV6-%Pred-Pre: 103 %
FEV6-POST: 5.47 L
FEV6-Pre: 5.07 L
FEV6FVC-%Change-Post: 0 %
FEV6FVC-%PRED-POST: 104 %
FEV6FVC-%Pred-Pre: 104 %
FVC-%CHANGE-POST: 8 %
FVC-%Pred-Post: 107 %
FVC-%Pred-Pre: 99 %
FVC-Post: 5.54 L
FVC-Pre: 5.11 L
POST FEV1/FVC RATIO: 73 %
POST FEV6/FVC RATIO: 99 %
PRE FEV1/FVC RATIO: 74 %
PRE FEV6/FVC RATIO: 99 %
RV % pred: 99 %
RV: 2.63 L
TLC % pred: 105 %
TLC: 8.26 L

## 2014-04-24 NOTE — Progress Notes (Signed)
PFT done today. 

## 2014-04-26 NOTE — Progress Notes (Signed)
Scroll down past the first list of data to "Results" hyperlink "scan" with the date etc. Open that and find the second page- usually interp is there.

## 2014-05-01 DIAGNOSIS — H524 Presbyopia: Secondary | ICD-10-CM | POA: Diagnosis not present

## 2014-05-01 DIAGNOSIS — H52223 Regular astigmatism, bilateral: Secondary | ICD-10-CM | POA: Diagnosis not present

## 2014-05-01 DIAGNOSIS — H04129 Dry eye syndrome of unspecified lacrimal gland: Secondary | ICD-10-CM | POA: Diagnosis not present

## 2014-05-01 DIAGNOSIS — H2513 Age-related nuclear cataract, bilateral: Secondary | ICD-10-CM | POA: Diagnosis not present

## 2014-05-01 DIAGNOSIS — H5213 Myopia, bilateral: Secondary | ICD-10-CM | POA: Diagnosis not present

## 2014-05-07 ENCOUNTER — Ambulatory Visit (INDEPENDENT_AMBULATORY_CARE_PROVIDER_SITE_OTHER): Payer: Medicare Other | Admitting: *Deleted

## 2014-05-07 ENCOUNTER — Encounter: Payer: Self-pay | Admitting: *Deleted

## 2014-05-07 ENCOUNTER — Telehealth: Payer: Self-pay | Admitting: *Deleted

## 2014-05-07 DIAGNOSIS — Z95810 Presence of automatic (implantable) cardiac defibrillator: Secondary | ICD-10-CM | POA: Diagnosis not present

## 2014-05-07 DIAGNOSIS — I519 Heart disease, unspecified: Secondary | ICD-10-CM | POA: Diagnosis not present

## 2014-05-07 NOTE — Telephone Encounter (Signed)
I spoke with the patient. 

## 2014-05-07 NOTE — Progress Notes (Signed)
EPIC Encounter for ICM Monitoring  Patient Name: Nation Cradle is a 69 y.o. male Date: 05/07/2014 Primary Care Physican: Lilian Coma, MD Primary Cardiologist: Allred/ Truitt Merle, NP Electrophysiologist: Allred Dry Weight: 226 lbs       In the past month, have you:  1. Gained more than 2 pounds in a day or more than 5 pounds in a week? no  2. Had changes in your medications (with verification of current medications)? no  3. Had more shortness of breath than is usual for you? no  4. Limited your activity because of shortness of breath? no  5. Not been able to sleep because of shortness of breath? no  6. Had increased swelling in your feet or ankles? no  7. Had symptoms of dehydration (dizziness, dry mouth, increased thirst, decreased urine output) no  8. Had changes in sodium restriction? no  9. Been compliant with medication? Yes   ICM trend:   Follow-up plan: ICM clinic phone appointment: 06/07/14. The patient's impedence was down from ~ 2/2-2/7. He reports no change in his symptoms or diet during this time. He is currently back to baseline. No changes made today.  Copy of note sent to patient's primary care physician, primary cardiologist, and device following physician.  [name], RN, BSN 05/07/2014 3:35 PM

## 2014-05-07 NOTE — Telephone Encounter (Signed)
ICM transmission received. I left a message for the patient to call. 

## 2014-05-22 ENCOUNTER — Other Ambulatory Visit: Payer: Self-pay | Admitting: Internal Medicine

## 2014-06-07 ENCOUNTER — Encounter: Payer: Self-pay | Admitting: *Deleted

## 2014-06-07 ENCOUNTER — Ambulatory Visit (INDEPENDENT_AMBULATORY_CARE_PROVIDER_SITE_OTHER): Payer: Medicare Other | Admitting: *Deleted

## 2014-06-07 DIAGNOSIS — I519 Heart disease, unspecified: Secondary | ICD-10-CM

## 2014-06-07 DIAGNOSIS — Z95810 Presence of automatic (implantable) cardiac defibrillator: Secondary | ICD-10-CM | POA: Diagnosis not present

## 2014-06-07 NOTE — Progress Notes (Signed)
EPIC Encounter for ICM Monitoring  Patient Name: Robert Ochoa is a 69 y.o. male Date: 06/07/2014 Primary Care Physican: Lilian Coma, MD Primary Cardiologist: Allred/ Truitt Merle, NP Electrophysiologist: Allred Dry Weight: 226 lbs       In the past month, have you:  1. Gained more than 2 pounds in a day or more than 5 pounds in a week? No. Patient states he weights have been just under 226 lbs.  2. Had changes in your medications (with verification of current medications)? no  3. Had more shortness of breath than is usual for you? no  4. Limited your activity because of shortness of breath? no  5. Not been able to sleep because of shortness of breath? no  6. Had increased swelling in your feet or ankles? no  7. Had symptoms of dehydration (dizziness, dry mouth, increased thirst, decreased urine output) no  8. Had changes in sodium restriction? no  9. Been compliant with medication? Yes   ICM trend:   Follow-up plan: ICM clinic phone appointment: 07/09/14.  Copy of note sent to patient's primary care physician, primary cardiologist, and device following physician.  [name], RN, BSN 06/07/2014 11:03 AM

## 2014-06-15 ENCOUNTER — Other Ambulatory Visit: Payer: Self-pay | Admitting: Internal Medicine

## 2014-06-26 DIAGNOSIS — R972 Elevated prostate specific antigen [PSA]: Secondary | ICD-10-CM | POA: Diagnosis not present

## 2014-06-29 ENCOUNTER — Other Ambulatory Visit: Payer: Self-pay | Admitting: Internal Medicine

## 2014-07-02 DIAGNOSIS — R972 Elevated prostate specific antigen [PSA]: Secondary | ICD-10-CM | POA: Diagnosis not present

## 2014-07-02 DIAGNOSIS — N400 Enlarged prostate without lower urinary tract symptoms: Secondary | ICD-10-CM | POA: Diagnosis not present

## 2014-07-03 ENCOUNTER — Other Ambulatory Visit: Payer: Self-pay | Admitting: Internal Medicine

## 2014-07-09 ENCOUNTER — Ambulatory Visit (INDEPENDENT_AMBULATORY_CARE_PROVIDER_SITE_OTHER): Payer: Medicare Other | Admitting: *Deleted

## 2014-07-09 ENCOUNTER — Encounter: Payer: Self-pay | Admitting: Internal Medicine

## 2014-07-09 DIAGNOSIS — Z95810 Presence of automatic (implantable) cardiac defibrillator: Secondary | ICD-10-CM | POA: Diagnosis not present

## 2014-07-09 DIAGNOSIS — I428 Other cardiomyopathies: Secondary | ICD-10-CM

## 2014-07-09 DIAGNOSIS — I519 Heart disease, unspecified: Secondary | ICD-10-CM

## 2014-07-09 DIAGNOSIS — I429 Cardiomyopathy, unspecified: Secondary | ICD-10-CM

## 2014-07-09 LAB — MDC_IDC_ENUM_SESS_TYPE_REMOTE
Battery Remaining Longevity: 70 mo
Brady Statistic AP VS Percent: 85 %
Brady Statistic AS VP Percent: 1 %
Brady Statistic AS VS Percent: 1 %
Brady Statistic RA Percent Paced: 97 %
Brady Statistic RV Percent Paced: 14 %
HIGH POWER IMPEDANCE MEASURED VALUE: 56 Ohm
HighPow Impedance: 57 Ohm
Lead Channel Impedance Value: 450 Ohm
Lead Channel Impedance Value: 450 Ohm
Lead Channel Sensing Intrinsic Amplitude: 2.4 mV
Lead Channel Sensing Intrinsic Amplitude: 9.2 mV
Lead Channel Setting Pacing Amplitude: 2.5 V
Lead Channel Setting Pacing Amplitude: 2.5 V
Lead Channel Setting Pacing Pulse Width: 0.5 ms
Lead Channel Setting Sensing Sensitivity: 0.5 mV
MDC IDC MSMT BATTERY REMAINING PERCENTAGE: 84 %
MDC IDC MSMT BATTERY VOLTAGE: 2.98 V
MDC IDC PG MODEL: 2411
MDC IDC PG SERIAL: 7136426
MDC IDC SESS DTM: 20160411060017
MDC IDC SET ZONE DETECTION INTERVAL: 300 ms
MDC IDC SET ZONE DETECTION INTERVAL: 350 ms
MDC IDC STAT BRADY AP VP PERCENT: 14 %

## 2014-07-09 NOTE — Progress Notes (Signed)
Remote ICD transmission.   

## 2014-07-10 ENCOUNTER — Encounter: Payer: Self-pay | Admitting: *Deleted

## 2014-07-10 NOTE — Addendum Note (Signed)
Addended by: [name] C on: 07/10/2014 02:26 PM   Modules accepted: Level of Service

## 2014-07-10 NOTE — Progress Notes (Signed)
EPIC Encounter for ICM Monitoring  Patient Name: Robert Ochoa is a 69 y.o. male Date: 07/10/2014 Primary Care Physican: Lilian Coma, MD Primary Cardiologist: Allred/ Truitt Merle, NP Electrophysiologist: Allred Dry Weight: 226 lbs       In the past month, have you:  1. Gained more than 2 pounds in a day or more than 5 pounds in a week? no  2. Had changes in your medications (with verification of current medications)? no  3. Had more shortness of breath than is usual for you? no  4. Limited your activity because of shortness of breath? no  5. Not been able to sleep because of shortness of breath? no  6. Had increased swelling in your feet or ankles? no  7. Had symptoms of dehydration (dizziness, dry mouth, increased thirst, decreased urine output) no  8. Had changes in sodium restriction? no  9. Been compliant with medication? Yes   ICM trend:   Follow-up plan: ICM clinic phone appointment: 08/13/14. The patient had a slight change in his impedence below baseline from ~ 3/18-3/26. He does not recall any change in his symptoms or weight during that time. No changes made today.   Copy of note sent to patient's primary care physician, primary cardiologist, and device following physician.  [name], RN, BSN 07/10/2014 2:22 PM

## 2014-07-19 ENCOUNTER — Encounter: Payer: Self-pay | Admitting: Cardiology

## 2014-08-13 ENCOUNTER — Ambulatory Visit (INDEPENDENT_AMBULATORY_CARE_PROVIDER_SITE_OTHER): Payer: Medicare Other | Admitting: *Deleted

## 2014-08-13 ENCOUNTER — Encounter: Payer: Self-pay | Admitting: *Deleted

## 2014-08-13 DIAGNOSIS — Z95810 Presence of automatic (implantable) cardiac defibrillator: Secondary | ICD-10-CM | POA: Diagnosis not present

## 2014-08-13 DIAGNOSIS — I519 Heart disease, unspecified: Secondary | ICD-10-CM | POA: Diagnosis not present

## 2014-08-13 NOTE — Progress Notes (Signed)
EPIC Encounter for ICM Monitoring  Patient Name: Robert Ochoa is a 69 y.o. male Date: 08/13/2014 Primary Care Physican: Lilian Coma, MD Primary Cardiologist: Allred/ Truitt Merle, NP Electrophysiologist: Allred Dry Weight: 226 lbs       In the past month, have you:  1. Gained more than 2 pounds in a day or more than 5 pounds in a week? No. His weight may even be down a few pounds.   2. Had changes in your medications (with verification of current medications)? no  3. Had more shortness of breath than is usual for you? no  4. Limited your activity because of shortness of breath? no  5. Not been able to sleep because of shortness of breath? no  6. Had increased swelling in your feet or ankles? no  7. Had symptoms of dehydration (dizziness, dry mouth, increased thirst, decreased urine output) no  8. Had changes in sodium restriction? no  9. Been compliant with medication? Yes   ICM trend:   Follow-up plan: ICM clinic phone appointment: 09/13/14. The patient reports feeling well overall today. No changes made.   Copy of note sent to patient's primary care physician, primary cardiologist, and device following physician.  [name], RN, BSN 08/13/2014 3:07 PM

## 2014-08-16 DIAGNOSIS — Z85828 Personal history of other malignant neoplasm of skin: Secondary | ICD-10-CM | POA: Diagnosis not present

## 2014-08-16 DIAGNOSIS — L84 Corns and callosities: Secondary | ICD-10-CM | POA: Diagnosis not present

## 2014-08-16 DIAGNOSIS — L570 Actinic keratosis: Secondary | ICD-10-CM | POA: Diagnosis not present

## 2014-08-16 DIAGNOSIS — L718 Other rosacea: Secondary | ICD-10-CM | POA: Diagnosis not present

## 2014-08-16 DIAGNOSIS — L820 Inflamed seborrheic keratosis: Secondary | ICD-10-CM | POA: Diagnosis not present

## 2014-09-13 ENCOUNTER — Ambulatory Visit (INDEPENDENT_AMBULATORY_CARE_PROVIDER_SITE_OTHER): Payer: Medicare Other | Admitting: *Deleted

## 2014-09-13 ENCOUNTER — Encounter: Payer: Self-pay | Admitting: *Deleted

## 2014-09-13 DIAGNOSIS — Z95810 Presence of automatic (implantable) cardiac defibrillator: Secondary | ICD-10-CM

## 2014-09-13 DIAGNOSIS — I519 Heart disease, unspecified: Secondary | ICD-10-CM | POA: Diagnosis not present

## 2014-09-13 NOTE — Progress Notes (Signed)
EPIC Encounter for ICM Monitoring  Patient Name: Robert Ochoa is a 69 y.o. male Date: 09/13/2014 Primary Care Physican: Lilian Coma, MD Primary Cardiologist: Allred/ Truitt Merle, NP Electrophysiologist: Allred Dry Weight: 226 lbs       In the past month, have you:  1. Gained more than 2 pounds in a day or more than 5 pounds in a week? no  2. Had changes in your medications (with verification of current medications)? no  3. Had more shortness of breath than is usual for you? no  4. Limited your activity because of shortness of breath? no  5. Not been able to sleep because of shortness of breath? no  6. Had increased swelling in your feet or ankles? no  7. Had symptoms of dehydration (dizziness, dry mouth, increased thirst, decreased urine output) no  8. Had changes in sodium restriction? no  9. Been compliant with medication? Yes   ICM trend:   Follow-up plan: ICM clinic phone appointment: 10/15/14. No changes made today.  Copy of note sent to patient's primary care physician, primary cardiologist, and device following physician.  [name], RN, BSN 09/13/2014 3:04 PM

## 2014-10-03 ENCOUNTER — Other Ambulatory Visit: Payer: Self-pay | Admitting: Internal Medicine

## 2014-10-04 ENCOUNTER — Other Ambulatory Visit: Payer: Self-pay

## 2014-10-04 MED ORDER — DABIGATRAN ETEXILATE MESYLATE 150 MG PO CAPS: ORAL_CAPSULE | ORAL | Status: DC

## 2014-10-09 ENCOUNTER — Encounter: Payer: Self-pay | Admitting: Nurse Practitioner

## 2014-10-09 ENCOUNTER — Ambulatory Visit (INDEPENDENT_AMBULATORY_CARE_PROVIDER_SITE_OTHER): Payer: Medicare Other | Admitting: Nurse Practitioner

## 2014-10-09 VITALS — BP 142/80 | HR 60 | Ht 74.0 in | Wt 223.8 lb

## 2014-10-09 DIAGNOSIS — R42 Dizziness and giddiness: Secondary | ICD-10-CM

## 2014-10-09 DIAGNOSIS — I428 Other cardiomyopathies: Secondary | ICD-10-CM

## 2014-10-09 DIAGNOSIS — Z95810 Presence of automatic (implantable) cardiac defibrillator: Secondary | ICD-10-CM | POA: Diagnosis not present

## 2014-10-09 DIAGNOSIS — I480 Paroxysmal atrial fibrillation: Secondary | ICD-10-CM | POA: Diagnosis not present

## 2014-10-09 DIAGNOSIS — R059 Cough, unspecified: Secondary | ICD-10-CM

## 2014-10-09 DIAGNOSIS — E785 Hyperlipidemia, unspecified: Secondary | ICD-10-CM

## 2014-10-09 DIAGNOSIS — Z7901 Long term (current) use of anticoagulants: Secondary | ICD-10-CM

## 2014-10-09 DIAGNOSIS — I429 Cardiomyopathy, unspecified: Secondary | ICD-10-CM

## 2014-10-09 DIAGNOSIS — R05 Cough: Secondary | ICD-10-CM

## 2014-10-09 MED ORDER — AMIODARONE HCL 200 MG PO TABS: 200.0000 mg | ORAL_TABLET | Freq: Every day | ORAL | Status: DC

## 2014-10-09 NOTE — Patient Instructions (Addendum)
We will be checking the following labs today - NONE  Fasting labs on day of echo   Medication Instructions:    Continue with your current medicines.     Testing/Procedures To Be Arranged:  Echocardiogram  Follow-Up:   See Dr. Rayann Heman in January   Other Special Instructions:   N/A  Call the [place] office at [phone] if you have any questions, problems or concerns.

## 2014-10-09 NOTE — Progress Notes (Signed)
CARDIOLOGY OFFICE NOTE  Date:  10/09/2014    Robert Ochoa Date of Birth: [birth year] Medical Record #[id number]  PCP:  Lilian Coma, MD  Cardiologist:  Allred    Chief Complaint  Patient presents with  . Cardiomyopathy    6 month check - seen for Dr. Rayann Heman    History of Present Illness: Robert Ochoa is a 69 y.o. male who presents today for a 6 month check. Seen for Dr. Rayann Heman. He has multiple medical issues which include past pneumonia with associated sepsis, renal failure and respiratory failure back in 2013. Has atrial fib. Has an ICD/pacemaker in place with a set lower rate of 60. Last EF was 35 to 40% back in October of 2013 and increased to 45 to 50% per echo from October of 2014. He is managed medically. His other issues include right ventricular dysplasia, ICD implant, PAF and paroxysmal VT. He remains on chronic amiodarone and chronic Pradaxa. Last PFT was March of 2014.   He was last seen here by Dr. Rayann Heman back in January - discussed cutting back his amiodarone but Robert Ochoa did not wish to change. To consider repeat echo on return.   Comes in today. Here alone. He is doing ok. He has chronically had issues with postural dizziness - even dating back to his 45's. No chest pain. Not short of breath. No swelling. Pretty active. Exercises - walks primarily. Tolerating his medicines. No excessive bruising. No recent labs. Has cut his amiodarone back to 100 mg alternating with 200 mg. Not fasting today.   Past Medical History  Diagnosis Date  . Ventricular tachycardia     ICD discharges 05/2011 for this & AF-RVR - started on amiodarone then.  . Arrhythmogenic right ventricular dysplasia     Hx of VT s/p dual-chamber St. Jude ICD. Cardiac MRI 2010 showed moderately dilated RV with hypokinesis, LV EF 47%. EF 50-55% 2010 but down to 45% 05/2011. Cath 01/2009 without significant CAD.  [name] Atrial fibrillation or flutter     Paroxysmal. Started on Pradaxa 05/2011.  . Cardiac  defibrillator in place   . Anxiety   . Septic shock(785.52) 10-27-11    with associated respiratory/renal failure/PAF/bradycardia  . LV dysfunction     per echo 10/2011; EF 35%    Past Surgical History  Procedure Laterality Date  . Appendectomy    . Cardiac defibrillator placement  02/19/09     St,. Jude   . Implantable cardioverter defibrillator generator change  03/01/13    for premature battery depletetion.  Now has a SJM Ellipse DR device placed by Dr Caryl Comes  . Implantable cardioverter defibrillator (icd) generator change N/A 03/01/2013    Procedure: ICD GENERATOR CHANGE;  Surgeon: [name], MD;  Location: [facility] CATH LAB;  Service: Cardiovascular;  Laterality: N/A;     Medications: Current Outpatient Prescriptions  Medication Sig Dispense Refill  . amiodarone (PACERONE) 200 MG tablet take 1 tablet by mouth once daily 90 tablet 1  . Calcium-Magnesium-Zinc (CAL-MAG-ZINC PO) Take 2 tablets by mouth daily.     . clindamycin (CLINDAGEL) 1 % gel TAKE 1 APPLICATION TO AFFECTED AREA OF SKIN TWICE DAILY for roaseca  0  . dabigatran (PRADAXA) 150 MG CAPS capsule take 1 capsule by mouth every 12 hours 180 capsule 1  . lisinopril (PRINIVIL,ZESTRIL) 10 MG tablet take 2 tablets by mouth once daily 180 tablet 1  . metoprolol tartrate (LOPRESSOR) 25 MG tablet take 3 tablets by mouth twice a day 180  tablet 6  . Omega-3 Fatty Acids (FISH OIL) 1000 MG CAPS Take 3 capsules by mouth daily.     . vitamin C (ASCORBIC ACID) 500 MG tablet Take 1,000 mg by mouth daily.      No current facility-administered medications for this visit.    Allergies: No Known Allergies  Social History: The patient  reports that he has never smoked. He has never used smokeless tobacco. He reports that he drinks alcohol. He reports that he does not use illicit drugs.   Family History: The patient's family history includes Allergies in his mother; Heart attack in his father.   Review of Systems: Please see the history  of present illness.   Otherwise, the review of systems is positive for none.   All other systems are reviewed and negative.   Physical Exam: VS:  BP 142/80 mmHg  Pulse 60  Ht 6\' 2"  (1.88 m)  Wt 223 lb 12.8 oz (101.515 kg)  BMI 28.72 kg/m2  SpO2 95% .  BMI Body mass index is 28.72 kg/(m^2).  Wt Readings from Last 3 Encounters:  10/09/14 223 lb 12.8 oz (101.515 kg)  04/06/14 228 lb 9.6 oz (103.692 kg)  01/23/14 231 lb 12.8 oz (105.144 kg)    General: Pleasant. Quite talkative. Well developed, well nourished and in no acute distress.  HEENT: Normal. Neck: Supple, no JVD, carotid bruits, or masses noted.  Cardiac: Regular rate and rhythm. No murmurs, rubs, or gallops. No edema.  Respiratory:  Lungs are clear to auscultation bilaterally with normal work of breathing.  GI: Soft and nontender.  MS: No deformity or atrophy. Gait and ROM intact. Skin: Warm and dry. Color is normal.  Neuro:  Strength and sensation are intact and no gross focal deficits noted.  Psych: Alert, appropriate and with normal affect.   LABORATORY DATA:  EKG:  EKG is not ordered today.   Lab Results  Component Value Date   WBC 7.9 01/25/2014   HGB 15.0 01/25/2014   HCT 46.6 01/25/2014   PLT 269.0 01/25/2014   GLUCOSE 88 01/25/2014   CHOL 161 01/25/2014   TRIG 76.0 01/25/2014   HDL 30.60* 01/25/2014   LDLCALC 115* 01/25/2014   ALT 31 01/25/2014   AST 28 01/25/2014   NA 134* 01/25/2014   K 4.5 01/25/2014   CL 104 01/25/2014   CREATININE 1.4 01/25/2014   BUN 21 01/25/2014   CO2 23 01/25/2014   TSH 0.61 01/25/2014   INR 1.18 10/30/2011    BNP (last 3 results) No results for input(s): BNP in the last 8760 hours.  ProBNP (last 3 results) No results for input(s): PROBNP in the last 8760 hours.   Other Studies Reviewed Today:  Echo Study Conclusions from October 2014 - Left ventricle: The cavity size was normal. Wall thickness was normal. Systolic function was mildly reduced. The estimated  ejection fraction was in the range of 45% to 50%. Diffuse hypokinesis. Doppler parameters are consistent with abnormal left ventricular relaxation (grade 1 diastolic dysfunction). - Left atrium: The atrium was mildly dilated. - Right ventricle: The cavity size was moderately to severely dilated. Systolic function was moderately reduced. - Right atrium: The atrium was moderately dilated. - Atrial septum: There was an atrial septal aneurysm. - Pulmonary arteries: PA peak pressure: 78mm Hg (S).    Assessment / Plan: 1. Systolic heart failure/nonischemic CM - EF is 45 to 50%. Looks compensated. No change in current regimen. Update his echo per Dr. Jackalyn Lombard last notation.  2. ARVD - has his ICD in place.    3. PAF - on amiodarone - needs surveillance labs - will come back fasting later with his echo. He has cut his dose back to alternating between 100 and 200 mg a day. I have told him that this is ok. Will continue to follow his device.   4. Chronic anticoagulation - on Pradaxa - no problems noted.   Current medicines are reviewed with the patient today.  The patient does not have concerns regarding medicines other than what has been noted above.  The following changes have been made:  See above.  Labs/ tests ordered today include:   No orders of the defined types were placed in this encounter.     Disposition:   FU with Dr. Rayann Heman in 6 months.    Patient is agreeable to this plan and will call if any problems develop in the interim.   Signed: Burtis Junes, RN, ANP-C 10/09/2014 2:56 PM  [facility] [address] [place], [place]  [address] Phone: [phone] Fax: [phone]

## 2014-10-11 ENCOUNTER — Other Ambulatory Visit: Payer: Self-pay

## 2014-10-11 ENCOUNTER — Ambulatory Visit (HOSPITAL_COMMUNITY): Payer: Medicare Other | Attending: Internal Medicine

## 2014-10-11 ENCOUNTER — Other Ambulatory Visit (INDEPENDENT_AMBULATORY_CARE_PROVIDER_SITE_OTHER): Payer: Medicare Other | Admitting: *Deleted

## 2014-10-11 DIAGNOSIS — I480 Paroxysmal atrial fibrillation: Secondary | ICD-10-CM

## 2014-10-11 DIAGNOSIS — Z79899 Other long term (current) drug therapy: Secondary | ICD-10-CM

## 2014-10-11 DIAGNOSIS — Z95810 Presence of automatic (implantable) cardiac defibrillator: Secondary | ICD-10-CM

## 2014-10-11 DIAGNOSIS — I428 Other cardiomyopathies: Secondary | ICD-10-CM

## 2014-10-11 DIAGNOSIS — Z7901 Long term (current) use of anticoagulants: Secondary | ICD-10-CM | POA: Diagnosis not present

## 2014-10-11 DIAGNOSIS — I77810 Thoracic aortic ectasia: Secondary | ICD-10-CM | POA: Insufficient documentation

## 2014-10-11 DIAGNOSIS — R059 Cough, unspecified: Secondary | ICD-10-CM

## 2014-10-11 DIAGNOSIS — R05 Cough: Secondary | ICD-10-CM

## 2014-10-11 DIAGNOSIS — E785 Hyperlipidemia, unspecified: Secondary | ICD-10-CM

## 2014-10-11 DIAGNOSIS — I429 Cardiomyopathy, unspecified: Secondary | ICD-10-CM

## 2014-10-11 DIAGNOSIS — I071 Rheumatic tricuspid insufficiency: Secondary | ICD-10-CM | POA: Diagnosis not present

## 2014-10-11 DIAGNOSIS — R42 Dizziness and giddiness: Secondary | ICD-10-CM

## 2014-10-11 LAB — TSH: TSH: 1.36 u[IU]/mL (ref 0.35–4.50)

## 2014-10-11 LAB — BASIC METABOLIC PANEL
BUN: 21 mg/dL (ref 6–23)
CO2: 26 mEq/L (ref 19–32)
Calcium: 9 mg/dL (ref 8.4–10.5)
Chloride: 105 mEq/L (ref 96–112)
Creatinine, Ser: 1.27 mg/dL (ref 0.40–1.50)
GFR: 59.61 mL/min — ABNORMAL LOW (ref >60.00)
Glucose, Bld: 92 mg/dL (ref 70–99)
Potassium: 4.6 mEq/L (ref 3.5–5.1)
Sodium: 137 mEq/L (ref 135–145)

## 2014-10-11 LAB — HEPATIC FUNCTION PANEL
ALT: 25 U/L (ref 0–53)
AST: 27 U/L (ref 0–37)
Albumin: 3.8 g/dL (ref 3.5–5.2)
Alkaline Phosphatase: 53 U/L (ref 39–117)
Bilirubin, Direct: 0.2 mg/dL (ref 0.0–0.3)
Total Bilirubin: 0.6 mg/dL (ref 0.2–1.2)
Total Protein: 6.8 g/dL (ref 6.0–8.3)

## 2014-10-11 LAB — CBC WITH DIFFERENTIAL/PLATELET
Basophils Absolute: 0 10*3/uL (ref 0.0–0.1)
Basophils Relative: 0.4 % (ref 0.0–3.0)
Eosinophils Absolute: 0.2 10*3/uL (ref 0.0–0.7)
Eosinophils Relative: 3 % (ref 0.0–5.0)
HCT: 47.1 % (ref 39.0–52.0)
Hemoglobin: 15.5 g/dL (ref 13.0–17.0)
Lymphocytes Relative: 27.6 % (ref 12.0–46.0)
Lymphs Abs: 1.5 10*3/uL (ref 0.7–4.0)
MCHC: 32.9 g/dL (ref 30.0–36.0)
MCV: 87.8 fl (ref 78.0–100.0)
Monocytes Absolute: 0.8 10*3/uL (ref 0.1–1.0)
Monocytes Relative: 14.3 % — ABNORMAL HIGH (ref 3.0–12.0)
Neutro Abs: 3 10*3/uL (ref 1.4–7.7)
Neutrophils Relative %: 54.7 % (ref 43.0–77.0)
Platelets: 192 10*3/uL (ref 150.0–400.0)
RBC: 5.36 Mil/uL (ref 4.22–5.81)
RDW: 13.8 % (ref 11.5–15.5)
WBC: 5.4 10*3/uL (ref 4.0–10.5)

## 2014-10-11 LAB — LIPID PANEL
Cholesterol: 153 mg/dL (ref 0–200)
HDL: 33.8 mg/dL — ABNORMAL LOW (ref >39.00)
LDL Cholesterol: 102 mg/dL — ABNORMAL HIGH (ref 0–99)
NonHDL: 119.2
Total CHOL/HDL Ratio: 5
Triglycerides: 86 mg/dL (ref 0.0–149.0)
VLDL: 17.2 mg/dL (ref 0.0–40.0)

## 2014-10-15 ENCOUNTER — Ambulatory Visit (INDEPENDENT_AMBULATORY_CARE_PROVIDER_SITE_OTHER): Payer: Medicare Other | Admitting: *Deleted

## 2014-10-15 ENCOUNTER — Telehealth: Payer: Self-pay | Admitting: *Deleted

## 2014-10-15 ENCOUNTER — Encounter: Payer: Self-pay | Admitting: *Deleted

## 2014-10-15 ENCOUNTER — Encounter: Payer: Self-pay | Admitting: Internal Medicine

## 2014-10-15 DIAGNOSIS — Z95810 Presence of automatic (implantable) cardiac defibrillator: Secondary | ICD-10-CM | POA: Diagnosis not present

## 2014-10-15 DIAGNOSIS — I429 Cardiomyopathy, unspecified: Secondary | ICD-10-CM

## 2014-10-15 DIAGNOSIS — I428 Other cardiomyopathies: Secondary | ICD-10-CM

## 2014-10-15 NOTE — Addendum Note (Signed)
Addended by: [name] C on: 10/15/2014 04:05 PM   Modules accepted: Level of Service

## 2014-10-15 NOTE — Telephone Encounter (Signed)
ICM transmission received. I left a message for the patient to call. 

## 2014-10-15 NOTE — Progress Notes (Signed)
Remote ICD transmission.   

## 2014-10-15 NOTE — Telephone Encounter (Signed)
I spoke with the patient. 

## 2014-10-15 NOTE — Progress Notes (Signed)
EPIC Encounter for ICM Monitoring  Patient Name: Robert Ochoa is a 69 y.o. male Date: 10/15/2014 Primary Care Physican: Lilian Coma, MD Primary Cardiologist: Allred/ Truitt Merle, NP Electrophysiologist: Allred Dry Weight: 223 lbs       In the past month, have you:  1. Gained more than 2 pounds in a day or more than 5 pounds in a week? no  2. Had changes in your medications (with verification of current medications)? Yes. The patient is trying to taper down on his dose of amiodarone. He is currently taking amiodarone 200 mg once every other day alternating with 100 mg once every other day.  3. Had more shortness of breath than is usual for you? no  4. Limited your activity because of shortness of breath? no  5. Not been able to sleep because of shortness of breath? no  6. Had increased swelling in your feet or ankles? no  7. Had symptoms of dehydration (dizziness, dry mouth, increased thirst, decreased urine output) no  8. Had changes in sodium restriction? no  9. Been compliant with medication? Yes   ICM trend:   Follow-up plan: ICM clinic phone appointment: 11/15/14. No changes made today.  Copy of note sent to patient's primary care physician, primary cardiologist, and device following physician.  [name], RN, BSN 10/15/2014 4:02 PM

## 2014-10-16 LAB — CUP PACEART REMOTE DEVICE CHECK
Battery Remaining Longevity: 66 mo
Battery Voltage: 2.96 V
Brady Statistic AS VP Percent: 1 %
HIGH POWER IMPEDANCE MEASURED VALUE: 49 Ohm
HIGH POWER IMPEDANCE MEASURED VALUE: 50 Ohm
Lead Channel Impedance Value: 430 Ohm
Lead Channel Pacing Threshold Amplitude: 0.75 V
Lead Channel Pacing Threshold Amplitude: 1 V
Lead Channel Pacing Threshold Pulse Width: 0.5 ms
Lead Channel Pacing Threshold Pulse Width: 0.5 ms
Lead Channel Setting Pacing Amplitude: 2.5 V
Lead Channel Setting Sensing Sensitivity: 0.5 mV
MDC IDC MSMT BATTERY REMAINING PERCENTAGE: 81 %
MDC IDC MSMT LEADCHNL RA IMPEDANCE VALUE: 430 Ohm
MDC IDC MSMT LEADCHNL RA SENSING INTR AMPL: 2.1 mV
MDC IDC MSMT LEADCHNL RV SENSING INTR AMPL: 7.3 mV
MDC IDC PG SERIAL: 7136426
MDC IDC SESS DTM: 20160718060017
MDC IDC SET LEADCHNL RA PACING AMPLITUDE: 2.5 V
MDC IDC SET LEADCHNL RV PACING PULSEWIDTH: 0.5 ms
MDC IDC STAT BRADY AP VP PERCENT: 14 %
MDC IDC STAT BRADY AP VS PERCENT: 85 %
MDC IDC STAT BRADY AS VS PERCENT: 1 %
MDC IDC STAT BRADY RA PERCENT PACED: 98 %
MDC IDC STAT BRADY RV PERCENT PACED: 15 %
Pulse Gen Model: 2411
Zone Setting Detection Interval: 300 ms
Zone Setting Detection Interval: 350 ms

## 2014-11-02 ENCOUNTER — Encounter: Payer: Self-pay | Admitting: Cardiology

## 2014-11-15 ENCOUNTER — Ambulatory Visit (INDEPENDENT_AMBULATORY_CARE_PROVIDER_SITE_OTHER): Payer: Medicare Other | Admitting: *Deleted

## 2014-11-15 DIAGNOSIS — Z95810 Presence of automatic (implantable) cardiac defibrillator: Secondary | ICD-10-CM | POA: Diagnosis not present

## 2014-11-16 NOTE — Progress Notes (Signed)
EPIC Encounter for ICM Monitoring  Patient Name: Robert Ochoa is a 69 y.o. male Date: 11/16/2014 Primary Care Physican: Lilian Coma, MD Primary Cardiologist: Ruben Im Electrophysiologist: Allred Dry Weight: 218 lbs       In the past month, have you:  1. Gained more than 2 pounds in a day or more than 5 pounds in a week? no  2. Had changes in your medications (with verification of current medications)? No, he continues with Amiodarone dose of 200 mg once every other day alternating with 100 mg every other day.    3. Had more shortness of breath than is usual for you? no  4. Limited your activity because of shortness of breath? no  5. Not been able to sleep because of shortness of breath? no  6. Had increased swelling in your feet or ankles? no  7. Had symptoms of dehydration (dizziness, dry mouth, increased thirst, decreased urine output) no  8. Had changes in sodium restriction? no  9. Been compliant with medication? Yes   ICM trend:   Follow-up plan: ICM clinic phone appointment 12/18/2014.  Patient changing diet and losing weight.  Goal is 215 lbs.  No changes today.    Copy of note sent to patient's primary care physician, primary cardiologist, and device following physician.  Rosalene Billings, RN, CCM 11/16/2014 1:59 PM

## 2014-12-05 ENCOUNTER — Other Ambulatory Visit: Payer: Self-pay | Admitting: Internal Medicine

## 2014-12-05 MED ORDER — AMIODARONE HCL 200 MG PO TABS: ORAL_TABLET | ORAL | Status: DC

## 2014-12-18 ENCOUNTER — Ambulatory Visit (INDEPENDENT_AMBULATORY_CARE_PROVIDER_SITE_OTHER): Payer: Medicare Other

## 2014-12-18 DIAGNOSIS — Z95810 Presence of automatic (implantable) cardiac defibrillator: Secondary | ICD-10-CM

## 2014-12-18 NOTE — Progress Notes (Signed)
EPIC Encounter for ICM Monitoring  Patient Name: Robert Ochoa is a 69 y.o. male Date: 12/18/2014 Primary Care Physican: Lilian Coma, MD Primary Cardiologist: Allred Electrophysiologist: Allred Dry Weight: 215 lbs       In the past month, have you:  1. Gained more than 2 pounds in a day or more than 5 pounds in a week? no  2. Had changes in your medications (with verification of current medications)? Decreasing Amiodarone as instructed by physician.  Taking 300mg  daily.  3. Had more shortness of breath than is usual for you? no  4. Limited your activity because of shortness of breath? no  5. Not been able to sleep because of shortness of breath? no  6. Had increased swelling in your feet or ankles? no  7. Had symptoms of dehydration (dizziness, dry mouth, increased thirst, decreased urine output) no  8. Had changes in sodium restriction? no  9. Been compliant with medication? Yes   ICM trend:  Follow-up plan: ICM clinic phone appointment 01/23/2015.   CorVue impedance above baseline 12/06/2014 to 12/11/2014 and he denied any symptoms.  He thinks he is staying hydrated.  He is feeling well and denied any HF symptoms.  Weighs once a week and education given for weighing daily to monitor for fluid retention.  Walking 4.5 Schul daily.  He is dieting to lose weight.  No changes today.   Copy of note sent to patient's primary care physician, primary cardiologist, and device following physician.  Rosalene Billings, RN, CCM 12/18/2014 11:18 AM

## 2014-12-22 ENCOUNTER — Other Ambulatory Visit: Payer: Self-pay | Admitting: Internal Medicine

## 2014-12-26 ENCOUNTER — Other Ambulatory Visit: Payer: Self-pay | Admitting: Internal Medicine

## 2014-12-27 ENCOUNTER — Telehealth: Payer: Self-pay

## 2014-12-27 NOTE — Telephone Encounter (Signed)
Patient called requesting Pradaxa 150mg  samples. Provided samples for one month.

## 2015-01-02 ENCOUNTER — Telehealth: Payer: Self-pay

## 2015-01-02 NOTE — Telephone Encounter (Signed)
Patient called asking for samples of Pradaxa 150mg .  I provided 3 boxes.

## 2015-01-23 ENCOUNTER — Ambulatory Visit (INDEPENDENT_AMBULATORY_CARE_PROVIDER_SITE_OTHER): Payer: Medicare Other | Admitting: *Deleted

## 2015-01-23 ENCOUNTER — Telehealth: Payer: Self-pay | Admitting: Cardiology

## 2015-01-23 DIAGNOSIS — Z95810 Presence of automatic (implantable) cardiac defibrillator: Secondary | ICD-10-CM | POA: Diagnosis not present

## 2015-01-23 DIAGNOSIS — I429 Cardiomyopathy, unspecified: Secondary | ICD-10-CM | POA: Diagnosis not present

## 2015-01-23 DIAGNOSIS — I428 Other cardiomyopathies: Secondary | ICD-10-CM

## 2015-01-23 NOTE — Telephone Encounter (Signed)
Pt will send transmission when he gets home tonight.

## 2015-01-24 ENCOUNTER — Encounter: Payer: Self-pay | Admitting: Cardiology

## 2015-01-24 LAB — CUP PACEART REMOTE DEVICE CHECK
Battery Remaining Percentage: 78 %
Battery Voltage: 2.96 V
Brady Statistic RA Percent Paced: 98 %
HIGH POWER IMPEDANCE MEASURED VALUE: 54 Ohm
HIGH POWER IMPEDANCE MEASURED VALUE: 54 Ohm
Implantable Lead Implant Date: 20101123
Implantable Lead Implant Date: 20101123
Implantable Lead Location: 753859
Implantable Lead Location: 753860
Lead Channel Impedance Value: 440 Ohm
Lead Channel Pacing Threshold Amplitude: 0.75 V
Lead Channel Pacing Threshold Amplitude: 1 V
Lead Channel Pacing Threshold Pulse Width: 0.5 ms
Lead Channel Pacing Threshold Pulse Width: 0.5 ms
Lead Channel Sensing Intrinsic Amplitude: 8.6 mV
Lead Channel Setting Pacing Amplitude: 2.5 V
Lead Channel Setting Pacing Amplitude: 2.5 V
Lead Channel Setting Pacing Pulse Width: 0.5 ms
MDC IDC LEAD MODEL: 7121
MDC IDC MSMT BATTERY REMAINING LONGEVITY: 65 mo
MDC IDC MSMT LEADCHNL RA IMPEDANCE VALUE: 490 Ohm
MDC IDC MSMT LEADCHNL RA SENSING INTR AMPL: 4.2 mV
MDC IDC PG SERIAL: 7136426
MDC IDC SESS DTM: 20161027053022
MDC IDC SET LEADCHNL RV SENSING SENSITIVITY: 0.5 mV
MDC IDC STAT BRADY AP VP PERCENT: 16 %
MDC IDC STAT BRADY AP VS PERCENT: 84 %
MDC IDC STAT BRADY AS VP PERCENT: 1 %
MDC IDC STAT BRADY AS VS PERCENT: 1 %
MDC IDC STAT BRADY RV PERCENT PACED: 16 %

## 2015-01-24 NOTE — Progress Notes (Addendum)
EPIC Encounter for ICM Monitoring  Patient Name: Robert Ochoa is a 69 y.o. male Date: 01/24/2015 Primary Care Physican: Lilian Coma, MD Primary Cardiologist: Allred Electrophysiologist: Allred Dry Weight: 214 lbs       In the past month, have you:  1. Gained more than 2 pounds in a day or more than 5 pounds in a week? no  2. Had changes in your medications (with verification of current medications)? He has tapered Amiodarone to 150mg  daily and he reported he discussed this with Dr Rayann Heman at last appointment.   3. Had more shortness of breath than is usual for you? no  4. Limited your activity because of shortness of breath? no  5. Not been able to sleep because of shortness of breath? no  6. Had increased swelling in your feet or ankles? no  7. Had symptoms of dehydration (dizziness, dry mouth, increased thirst, decreased urine output) no  8. Had changes in sodium restriction? no  9. Been compliant with medication? Yes   ICM trend: 01/24/2015   Follow-up plan: ICM clinic phone appointment on 02/26/2015.  Corvue impedance below baseline 01/11/2015 to 01/14/2015 and he denied any HF symptoms.  Impedance above 01/15/2015 to 01/24/2015 and he reported he was drinking less water than usual in the last couple of weeks. 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 He will be increasing fluids today.  He stated he is feeling great.  No changes today.  Copy of note sent to patient's primary care physician, primary cardiologist, and device following physician.  Rosalene Billings, RN, CCM 01/24/2015 10:33 AM

## 2015-01-24 NOTE — Progress Notes (Signed)
Remote ICD transmission.   

## 2015-01-26 DIAGNOSIS — Z23 Encounter for immunization: Secondary | ICD-10-CM | POA: Diagnosis not present

## 2015-02-04 ENCOUNTER — Telehealth: Payer: Self-pay | Admitting: Internal Medicine

## 2015-02-04 NOTE — Telephone Encounter (Signed)
Patient calling the office for samples of medication:   1.  What medication and dosage are you requesting samples for? Prodaxa  2.  Are you currently out of this medication? He will be out in a few days

## 2015-02-05 ENCOUNTER — Telehealth: Payer: Self-pay

## 2015-02-05 NOTE — Telephone Encounter (Signed)
F/u   Pt calling concerning previous message about samples.[name]

## 2015-02-05 NOTE — Telephone Encounter (Signed)
Provided 4 boxes of Pradaxa 150 mg samples

## 2015-02-07 ENCOUNTER — Encounter: Payer: Self-pay | Admitting: Cardiology

## 2015-02-25 DIAGNOSIS — Z85828 Personal history of other malignant neoplasm of skin: Secondary | ICD-10-CM | POA: Diagnosis not present

## 2015-02-25 DIAGNOSIS — L720 Epidermal cyst: Secondary | ICD-10-CM | POA: Diagnosis not present

## 2015-02-26 ENCOUNTER — Ambulatory Visit (INDEPENDENT_AMBULATORY_CARE_PROVIDER_SITE_OTHER): Payer: Medicare Other

## 2015-02-26 DIAGNOSIS — I429 Cardiomyopathy, unspecified: Secondary | ICD-10-CM

## 2015-02-26 DIAGNOSIS — I428 Other cardiomyopathies: Secondary | ICD-10-CM

## 2015-02-26 DIAGNOSIS — Z95810 Presence of automatic (implantable) cardiac defibrillator: Secondary | ICD-10-CM | POA: Diagnosis not present

## 2015-02-28 NOTE — Progress Notes (Signed)
EPIC Encounter for ICM Monitoring  Patient Name: Robert Ochoa is a 69 y.o. male Date: 02/28/2015 Primary Care Physican: Lilian Coma, MD Primary Cardiologist: Allred Electrophysiologist: Allred Dry Weight: 211 lb       In the past month, have you:  1. Gained more than 2 pounds in a day or more than 5 pounds in a week? no  2. Had changes in your medications (with verification of current medications)? no  3. Had more shortness of breath than is usual for you? no  4. Limited your activity because of shortness of breath? no  5. Not been able to sleep because of shortness of breath? no  6. Had increased swelling in your feet or ankles? no  7. Had symptoms of dehydration (dizziness, dry mouth, increased thirst, decreased urine output) no  8. Had changes in sodium restriction? no  9. Been compliant with medication? Yes   ICM trend:  02/26/2015   Follow-up plan: ICM clinic phone appointment 05/13/2015, he has an office appointment with Dr Rayann Heman 04/09/2014.  Corvue daily impedance below baseline 02/21/2015 to 02/25/2015 and he reported it is due to holiday food.  He has gotten back on track with his diet.  He denied any HF symptoms.  No changes today.   Copy of note sent to patient's primary care physician, primary cardiologist, and device following physician.  Rosalene Billings, RN, CCM 02/28/2015 3:50 PM

## 2015-03-06 ENCOUNTER — Telehealth: Payer: Self-pay | Admitting: *Deleted

## 2015-03-06 ENCOUNTER — Telehealth: Payer: Self-pay | Admitting: Internal Medicine

## 2015-03-06 NOTE — Telephone Encounter (Signed)
Spoke with patient to tell him that we had samples available to him at the front desk.

## 2015-03-06 NOTE — Telephone Encounter (Signed)
Patient calling the office for samples of medication:   1.  What medication and dosage are you requesting samples for? Pradaxa 150 mg taken twice a day  (in donut hole)   2.  Are you currently out of this medication? No will be out in a couple of days but he will be out of town for 8 days.

## 2015-03-07 DIAGNOSIS — L812 Freckles: Secondary | ICD-10-CM | POA: Diagnosis not present

## 2015-03-07 DIAGNOSIS — L821 Other seborrheic keratosis: Secondary | ICD-10-CM | POA: Diagnosis not present

## 2015-03-07 DIAGNOSIS — L570 Actinic keratosis: Secondary | ICD-10-CM | POA: Diagnosis not present

## 2015-03-07 DIAGNOSIS — D692 Other nonthrombocytopenic purpura: Secondary | ICD-10-CM | POA: Diagnosis not present

## 2015-03-07 DIAGNOSIS — Z85828 Personal history of other malignant neoplasm of skin: Secondary | ICD-10-CM | POA: Diagnosis not present

## 2015-03-07 DIAGNOSIS — L720 Epidermal cyst: Secondary | ICD-10-CM | POA: Diagnosis not present

## 2015-03-27 ENCOUNTER — Telehealth: Payer: Self-pay | Admitting: Internal Medicine

## 2015-03-27 NOTE — Telephone Encounter (Signed)
1 box of pradaxa sample left upfront

## 2015-03-27 NOTE — Telephone Encounter (Signed)
Patient calling the office for samples of medication:   1.  What medication and dosage are you requesting samples for?Prodaxa 150 mg   2.  Are you currently out of this medication? No

## 2015-04-10 ENCOUNTER — Other Ambulatory Visit: Payer: Self-pay | Admitting: Internal Medicine

## 2015-04-10 ENCOUNTER — Ambulatory Visit (INDEPENDENT_AMBULATORY_CARE_PROVIDER_SITE_OTHER): Payer: Medicare Other | Admitting: Internal Medicine

## 2015-04-10 ENCOUNTER — Encounter: Payer: Self-pay | Admitting: Internal Medicine

## 2015-04-10 ENCOUNTER — Telehealth: Payer: Self-pay

## 2015-04-10 VITALS — BP 132/82 | HR 78 | Ht 74.0 in | Wt 210.2 lb

## 2015-04-10 DIAGNOSIS — I4729 Other ventricular tachycardia: Secondary | ICD-10-CM

## 2015-04-10 DIAGNOSIS — I480 Paroxysmal atrial fibrillation: Secondary | ICD-10-CM

## 2015-04-10 DIAGNOSIS — Q208 Other congenital malformations of cardiac chambers and connections: Secondary | ICD-10-CM

## 2015-04-10 DIAGNOSIS — I429 Cardiomyopathy, unspecified: Secondary | ICD-10-CM

## 2015-04-10 DIAGNOSIS — I472 Ventricular tachycardia: Secondary | ICD-10-CM | POA: Diagnosis not present

## 2015-04-10 DIAGNOSIS — N521 Erectile dysfunction due to diseases classified elsewhere: Secondary | ICD-10-CM | POA: Insufficient documentation

## 2015-04-10 DIAGNOSIS — Q249 Congenital malformation of heart, unspecified: Secondary | ICD-10-CM

## 2015-04-10 DIAGNOSIS — Z95810 Presence of automatic (implantable) cardiac defibrillator: Secondary | ICD-10-CM | POA: Diagnosis not present

## 2015-04-10 DIAGNOSIS — I428 Other cardiomyopathies: Secondary | ICD-10-CM

## 2015-04-10 LAB — CUP PACEART INCLINIC DEVICE CHECK
Date Time Interrogation Session: 20170111101248
HighPow Impedance: 57.0898
Implantable Lead Implant Date: 20101123
Implantable Lead Implant Date: 20101123
Implantable Lead Location: 753859
Implantable Lead Location: 753860
Lead Channel Impedance Value: 450 Ohm
Lead Channel Impedance Value: 487.5 Ohm
Lead Channel Pacing Threshold Amplitude: 1 V
Lead Channel Pacing Threshold Pulse Width: 0.5 ms
Lead Channel Sensing Intrinsic Amplitude: 2.1 mV
Lead Channel Sensing Intrinsic Amplitude: 7 mV
Lead Channel Setting Pacing Amplitude: 2.5 V
Lead Channel Setting Pacing Pulse Width: 0.5 ms
MDC IDC LEAD MODEL: 7121
MDC IDC MSMT BATTERY REMAINING LONGEVITY: 64.8
MDC IDC MSMT LEADCHNL RA PACING THRESHOLD AMPLITUDE: 1 V
MDC IDC MSMT LEADCHNL RA PACING THRESHOLD PULSEWIDTH: 0.5 ms
MDC IDC SET LEADCHNL RV PACING AMPLITUDE: 2.5 V
MDC IDC SET LEADCHNL RV SENSING SENSITIVITY: 0.5 mV
MDC IDC STAT BRADY RA PERCENT PACED: 98 %
MDC IDC STAT BRADY RV PERCENT PACED: 15 %
Pulse Gen Serial Number: 7136426

## 2015-04-10 MED ORDER — APIXABAN 5 MG PO TABS: 5.0000 mg | ORAL_TABLET | Freq: Two times a day (BID) | ORAL | Status: DC

## 2015-04-10 MED ORDER — SILDENAFIL CITRATE 100 MG PO TABS: 100.0000 mg | ORAL_TABLET | Freq: Every day | ORAL | Status: DC | PRN

## 2015-04-10 NOTE — Patient Instructions (Addendum)
Medication Instructions:  Your physician has recommended you make the following change in your medication:  1) Stop Pradaxa 2) Start Eliquis 5 mg twice daily 3) Take Viagra 100mg --take 50 mg as needed prior to sexual activity    Labwork: None ordered   Testing/Procedures:  Your physician has recommended that you have a pulmonary function test. Pulmonary Function Tests are a group of tests that measure how well air moves in and out of your lungs.   Your physician has requested that you have an echocardiogram. Echocardiography is a painless test that uses sound waves to create images of your heart. It provides your doctor with information about the size and shape of your heart and how well your heart's chambers and valves are working. This procedure takes approximately one hour. There are no restrictions for this procedure.--in July     Follow-Up:  Remote monitoring is used to monitor your ICD from home. This monitoring reduces the number of office visits required to check your device to one time per year. It allows [place] to keep an eye on the functioning of your device to ensure it is working properly. You are scheduled for a device check from home on 07/10/15. You may send your transmission at any time that day. If you have a wireless device, the transmission will be sent automatically. After your physician reviews your transmission, you will receive a postcard with your next transmission date.   Your physician wants you to follow-up in: 6 months with Truitt Merle, NP and 12 months with Dr Vallery Ridge will receive a reminder letter in the mail two months in advance. If you don't receive a letter, please call our office to schedule the follow-up appointment.   Any Other Special Instructions Will Be Listed Below (If Applicable).     If you need a refill on your cardiac medications before your next appointment, please call your pharmacy.

## 2015-04-10 NOTE — Telephone Encounter (Signed)
Prior auth for Eliquis 5 mg sent to [facility]. 

## 2015-04-10 NOTE — Progress Notes (Signed)
PCP: Lilian Coma, MD  Robert Ochoa is a 70 y.o. male who presents today for routine electrophysiology followup.  Since his last visit, the patient reports doing very well.  Today, he denies symptoms of palpitations, chest pain, shortness of breath,  lower extremity edema, dizziness, presyncope, syncope, or ICD shocks.  + erectile dysfunction which is new  The patient is otherwise without complaint today.   Past Medical History  Diagnosis Date  . Ventricular tachycardia ([place])     ICD discharges 05/2011 for this & AF-RVR - started on amiodarone then.  . Arrhythmogenic right ventricular dysplasia ([place])     Hx of VT s/p dual-chamber St. Jude ICD. Cardiac MRI 2010 showed moderately dilated RV with hypokinesis, LV EF 47%. EF 50-55% 2010 but down to 45% 05/2011. Cath 01/2009 without significant CAD.  [name] Atrial fibrillation or flutter     Paroxysmal. Started on Pradaxa 05/2011.  . Cardiac defibrillator in place   . Anxiety   . Septic shock(785.52) 10-27-11    with associated respiratory/renal failure/PAF/bradycardia  . LV dysfunction     per echo 10/2011; EF 35%   Past Surgical History  Procedure Laterality Date  . Appendectomy    . Cardiac defibrillator placement  02/19/09     St,. Jude   . Implantable cardioverter defibrillator generator change  03/01/13    for premature battery depletetion.  Now has a SJM Ellipse DR device placed by Dr Caryl Comes  . Implantable cardioverter defibrillator (icd) generator change N/A 03/01/2013    Procedure: ICD GENERATOR CHANGE;  Surgeon: [name], MD;  Location: [facility] CATH LAB;  Service: Cardiovascular;  Laterality: N/A;    Current Outpatient Prescriptions  Medication Sig Dispense Refill  . amiodarone (PACERONE) 200 MG tablet Take 100 mg by mouth daily.    . Calcium-Magnesium-Zinc (CAL-MAG-ZINC PO) Take 2 tablets by mouth daily.     . dabigatran (PRADAXA) 150 MG CAPS capsule take 1 capsule by mouth every 12 hours 180 capsule 1  . lisinopril  (PRINIVIL,ZESTRIL) 10 MG tablet take 2 tablets by mouth once daily 180 tablet 1  . metoprolol tartrate (LOPRESSOR) 25 MG tablet take 3 tablets by mouth twice a day 180 tablet 3  . Omega-3 Fatty Acids (FISH OIL) 1000 MG CAPS Take 3 capsules by mouth daily.     . vitamin C (ASCORBIC ACID) 500 MG tablet Take 1,000 mg by mouth daily.      No current facility-administered medications for this visit.    Physical Exam: Filed Vitals:   04/10/15 0921  BP: 132/82  Pulse: 78  Height: 6\' 2"  (1.88 m)  Weight: 210 lb 3.2 oz (95.346 kg)    GEN- The patient is well appearing, alert and oriented x 3 today.   Head- normocephalic, atraumatic Eyes-  Sclera clear, conjunctiva pink Ears- hearing intact Oropharynx- clear Lungs- Clear to ausculation bilaterally, normal work of breathing Chest- ICD pocket is well healed Heart- Regular rate and rhythm, no murmurs, rubs or gallops, PMI not laterally displaced GI- soft, NT, ND, + BS Extremities- no clubbing, cyanosis, or edema  ICD interrogation- reviewed in detail today,  See PACEART report ekg today reveals atrial pacing 78 bpm, nonspecific ST/T changes (unchanged)  Assessment and Plan:  1.  VT Normal ICD function today See Pace Art report No changes today Continue amiodarone 100mg  daily PFTs 1/16 reviewed Due for repeat PFTs    2. AFib Well controlled No changes Per insurance, he would like to switch from pradaxa to eliquis Will stop  pradaxa and start eliquis 5mg  BID  3. Chronic systolic dysfunction Continue lisinopril Consider repeat echo upon return to see [facility] Aortic root enlargement noted last echo will need to be followed up on with repeat echo  4. Erectile dysfunction New diagosis Will give viagra prn Also discussed possibly reducing metoprolol if not improved If symptoms continue he will follow-up with Dr Sabra Heck (his urologist)  Will followup with [name] in 6 months  Follow-up with me in 12 months  Thompson Grayer MD,  [facility] 04/10/2015 10:02 AM

## 2015-04-11 ENCOUNTER — Telehealth: Payer: Self-pay

## 2015-04-11 ENCOUNTER — Other Ambulatory Visit: Payer: Self-pay | Admitting: Internal Medicine

## 2015-04-11 MED ORDER — APIXABAN 5 MG PO TABS: 5.0000 mg | ORAL_TABLET | Freq: Two times a day (BID) | ORAL | Status: DC

## 2015-04-11 NOTE — Telephone Encounter (Signed)
Pt calling requesting that Rx be sent to [facility]. Rx was sent to [facility]. I resent the Rx to the right pharmacy as requested. Confirmation received.

## 2015-04-11 NOTE — Telephone Encounter (Signed)
Eliquis approved through 03/29/2016. PA # [id number].

## 2015-04-29 ENCOUNTER — Ambulatory Visit (HOSPITAL_COMMUNITY)
Admission: RE | Admit: 2015-04-29 | Discharge: 2015-04-29 | Disposition: A | Discharge status: Home / Self Care | Payer: Medicare Other | Source: Ambulatory Visit | Attending: Internal Medicine | Admitting: Internal Medicine

## 2015-04-29 DIAGNOSIS — I472 Ventricular tachycardia: Secondary | ICD-10-CM

## 2015-04-29 DIAGNOSIS — I4729 Other ventricular tachycardia: Secondary | ICD-10-CM

## 2015-04-29 DIAGNOSIS — I428 Other cardiomyopathies: Secondary | ICD-10-CM

## 2015-04-29 DIAGNOSIS — I480 Paroxysmal atrial fibrillation: Secondary | ICD-10-CM | POA: Diagnosis not present

## 2015-04-29 LAB — PULMONARY FUNCTION TEST
DL/VA % PRED: 66 %
DL/VA: 3.18 ml/min/mmHg/L
DLCO UNC % PRED: 75 %
DLCO UNC: 28.3 ml/min/mmHg
FEF 25-75 POST: 3.98 L/s
FEF 25-75 PRE: 3.58 L/s
FEF2575-%Change-Post: 11 %
FEF2575-%PRED-POST: 140 %
FEF2575-%PRED-PRE: 126 %
FEV1-%Change-Post: 5 %
FEV1-%Pred-Post: 131 %
FEV1-%Pred-Pre: 124 %
FEV1-POST: 4.96 L
FEV1-Pre: 4.68 L
FEV1FVC-%CHANGE-POST: 5 %
FEV1FVC-%PRED-PRE: 94 %
FEV6-%CHANGE-POST: 2 %
FEV6-%PRED-POST: 136 %
FEV6-%Pred-Pre: 133 %
FEV6-Post: 6.62 L
FEV6-Pre: 6.45 L
FEV6FVC-%CHANGE-POST: 0 %
FEV6FVC-%Pred-Post: 103 %
FEV6FVC-%Pred-Pre: 103 %
FVC-%Change-Post: 0 %
FVC-%PRED-POST: 132 %
FVC-%Pred-Pre: 131 %
FVC-Post: 6.74 L
FVC-Pre: 6.7 L
POST FEV1/FVC RATIO: 74 %
PRE FEV1/FVC RATIO: 70 %
Post FEV6/FVC ratio: 98 %
Pre FEV6/FVC Ratio: 98 %
RV % PRED: 161 %
RV: 4.33 L
TLC % pred: 140 %
TLC: 11.03 L

## 2015-04-29 MED ORDER — ALBUTEROL SULFATE (2.5 MG/3ML) 0.083% IN NEBU
2.5000 mg | INHALATION_SOLUTION | Freq: Once | RESPIRATORY_TRACT | Status: AC
  Administered 2015-04-29: 2.5 mg via RESPIRATORY_TRACT

## 2015-05-13 ENCOUNTER — Ambulatory Visit (INDEPENDENT_AMBULATORY_CARE_PROVIDER_SITE_OTHER): Payer: Medicare Other

## 2015-05-13 DIAGNOSIS — Z95810 Presence of automatic (implantable) cardiac defibrillator: Secondary | ICD-10-CM

## 2015-05-13 DIAGNOSIS — I428 Other cardiomyopathies: Secondary | ICD-10-CM

## 2015-05-13 DIAGNOSIS — I429 Cardiomyopathy, unspecified: Secondary | ICD-10-CM | POA: Diagnosis not present

## 2015-05-13 NOTE — Progress Notes (Signed)
EPIC Encounter for ICM Monitoring  Patient Name: Robert Ochoa is a 70 y.o. male Date: 05/13/2015 Primary Care Physican: Lilian Coma, MD Primary Cardiologist: Allred Electrophysiologist: Allred Dry Weight: 200 lbs       In the past month, have you:  1. Gained more than 2 pounds in a day or more than 5 pounds in a week? no  2. Had changes in your medications (with verification of current medications)? no  3. Had more shortness of breath than is usual for you? no  4. Limited your activity because of shortness of breath? no  5. Not been able to sleep because of shortness of breath? no  6. Had increased swelling in your feet or ankles? no  7. Had symptoms of dehydration (dizziness, dry mouth, increased thirst, decreased urine output) no  8. Had changes in sodium restriction? no  9. Been compliant with medication? Yes   ICM trend: 3 month view    ICM trend: 1 year view   Follow-up plan: ICM clinic phone appointment on 06/18/2015.  Corvue thoracic impedance trending along reference line.  He reported he is feeling the best he has felt in a long time.  He denied any fluid symptoms and encouraged him to call if any develop.  He reported he is dieting to lose some weight.    No changes today.    Copy of note sent to patient's primary care physician, primary cardiologist, and device following physician.  Rosalene Billings, RN, CCM 05/13/2015 3:36 PM

## 2015-06-05 ENCOUNTER — Telehealth: Payer: Self-pay | Admitting: *Deleted

## 2015-06-05 MED ORDER — AMIODARONE HCL 100 MG PO TABS
100.0000 mg | ORAL_TABLET | Freq: Every day | ORAL | Status: DC
Start: 2015-06-05 — End: 2015-10-09

## 2015-06-05 NOTE — Telephone Encounter (Signed)
called about his amiodarone, when i called him back he stated they had worked it out at the pharmacy.

## 2015-06-05 NOTE — Telephone Encounter (Signed)
Patient calling to have rx for Amiodarone 200mg , take half tab daily by mouth reduced to 100mg .  I returned patient's call to let him know I have sent rx Amiodarone 100mg  once daily by mouth to pharmacy.  Unable to reach patient.

## 2015-06-05 NOTE — Telephone Encounter (Signed)
Patient returned call about Rx for Amiodarone 100mg . I let patient know that some patients in the past said that once they changed rx their co-pay had doubled in the past. For that reason I did not delete his other rx for Amiodarone 200mg , 1/2 tab daily just in case he decides he does not want to pay a higher price if it is not reasonable. He agreed to this and will call the pharmacy to see if there is a price different and if so how big a difference. If he is not happy with price he can go back to his old rx (200mg ) vs staying on the new rx (100mg ). He said thank you and will let us know which he will decide to take.

## 2015-06-18 ENCOUNTER — Ambulatory Visit (INDEPENDENT_AMBULATORY_CARE_PROVIDER_SITE_OTHER): Payer: Medicare Other

## 2015-06-18 DIAGNOSIS — Z95810 Presence of automatic (implantable) cardiac defibrillator: Secondary | ICD-10-CM

## 2015-06-18 DIAGNOSIS — I428 Other cardiomyopathies: Secondary | ICD-10-CM

## 2015-06-18 DIAGNOSIS — I429 Cardiomyopathy, unspecified: Secondary | ICD-10-CM

## 2015-06-18 NOTE — Progress Notes (Signed)
EPIC Encounter for ICM Monitoring  Patient Name: Robert Ochoa is a 70 y.o. male Date: 06/18/2015 Primary Care Physican: Lilian Coma, MD Primary Cardiologist: Allred Electrophysiologist: Allred Dry Weight: 200 lb   In the past month, have you:  1. Gained more than 2 pounds in a day or more than 5 pounds in a week? no  2. Had changes in your medications (with verification of current medications)? no  3. Had more shortness of breath than is usual for you? no  4. Limited your activity because of shortness of breath? no  5. Not been able to sleep because of shortness of breath? no  6. Had increased swelling in your feet or ankles? no  7. Had symptoms of dehydration (dizziness, dry mouth, increased thirst, decreased urine output) no  8. Had changes in sodium restriction? Not follow low sodium diet  9. Been compliant with medication? Yes   ICM trend: 3 month view for 06/18/2015   ICM trend: 1 year view for 06/18/2015   Follow-up plan: ICM clinic phone appointment on 07/02/2015.  Thoracic impedance below reference line from 05/31/2015 to 06/08/2015 and 06/16/2015 to 06/18/2015 suggesting fluid accumulation.  Thoracic impedance trending toward reference line on 06/18/2015.  Reviewed fluid symptoms with patient and he denied any symptoms at this time.  Patient reported he does eat out frequently and discussed sodium levels of restaurant foods.  Also encouraged to read food labels at home to determine how much sodium he has in one day.  Education given to limit sodium intake to < 2000 mg and fluid intake to 64 oz daily.   He stated he is going to be more aware of sodium levels of foods he eats at home.   Encouraged to call for any fluid symptoms.      Advised would send to Dr Rayann Heman for review and if any recommendations will call him back.  Repeat transmission on 07/02/2015.  Patient currently not prescribed a diuretic.    Copy of note sent to patient's primary care physician, primary  cardiologist, and device following physician.  Rosalene Billings, RN, CCM 06/18/2015 10:56 AM

## 2015-06-24 NOTE — Progress Notes (Signed)
Reviewed transmission with Dr Rayann Heman in office.  No changes today and continue to monitor fluid levels.

## 2015-07-02 ENCOUNTER — Ambulatory Visit (INDEPENDENT_AMBULATORY_CARE_PROVIDER_SITE_OTHER): Payer: Medicare Other

## 2015-07-02 DIAGNOSIS — I429 Cardiomyopathy, unspecified: Secondary | ICD-10-CM

## 2015-07-02 DIAGNOSIS — Z95810 Presence of automatic (implantable) cardiac defibrillator: Secondary | ICD-10-CM

## 2015-07-02 DIAGNOSIS — I428 Other cardiomyopathies: Secondary | ICD-10-CM

## 2015-07-03 NOTE — Progress Notes (Signed)
EPIC Encounter for ICM Monitoring  Patient Name: Robert Ochoa is a 70 y.o. male Date: 07/03/2015 Primary Care Physican: Lilian Coma, MD Primary Cardiologist: Allred Electrophysiologist: Allred Dry Weight: unknown   In the past month, have you:  1. Gained more than 2 pounds in a day or more than 5 pounds in a week? no  2. Had changes in your medications (with verification of current medications)? no  3. Had more shortness of breath than is usual for you? no  4. Limited your activity because of shortness of breath? no  5. Not been able to sleep because of shortness of breath? no  6. Had increased swelling in your feet or ankles? no  7. Had symptoms of dehydration (dizziness, dry mouth, increased thirst, decreased urine output) no  8. Had changes in sodium restriction? no  9. Been compliant with medication? Yes   ICM trend: 3 month view for 07/02/2015   ICM trend: 1 year view for 07/02/2015   Follow-up plan: No plan for ICM clinic follow up.  Patient has determined he did not want to be followed on a monthly basis.  He stated he is asymptomatic and feels it is not necessary.  Attempted to explain benefits of ICM monitoring.    He reported he follows low salt diet.  He has spoken with restaurant staff regarding amount of salt in foods and was told there is not a lot of salt added to the foods.  He reported if he has symptoms then he will not hesitate to call Dr Rayann Heman.  Advised patient of days that showed fluid accumulation and he stated there is nothing more he can to regarding his lifestyle.         Copy of note sent to Dr Rayann Heman, advising patient has opted out of ICM clinic at this time.     Rosalene Billings, RN, CCM 07/03/2015 10:37 AM

## 2015-07-04 ENCOUNTER — Other Ambulatory Visit: Payer: Self-pay | Admitting: Internal Medicine

## 2015-07-04 NOTE — Telephone Encounter (Signed)
lisinopril (PRINIVIL,ZESTRIL) 10 MG tablet  Medication   Date: 04/11/2015  Department: [address] Office  Ordering/Authorizing: Thompson Grayer, MD      Order Providers    Prescribing Provider Encounter Provider   Thompson Grayer, MD Thompson Grayer, MD    Medication Detail      Disp Refills Start End     lisinopril (PRINIVIL,ZESTRIL) 10 MG tablet 180 tablet 3 04/11/2015     Sig: TAKE 2 TABLETS BY MOUTH DAILY    E-Prescribing Status: Receipt confirmed by pharmacy (04/11/2015 11:40 AM EST)     Pharmacy    [id number] - [place], [address]

## 2015-07-10 ENCOUNTER — Ambulatory Visit (INDEPENDENT_AMBULATORY_CARE_PROVIDER_SITE_OTHER): Payer: Medicare Other | Admitting: *Deleted

## 2015-07-10 DIAGNOSIS — I429 Cardiomyopathy, unspecified: Secondary | ICD-10-CM

## 2015-07-10 DIAGNOSIS — I472 Ventricular tachycardia: Secondary | ICD-10-CM

## 2015-07-10 DIAGNOSIS — I428 Other cardiomyopathies: Secondary | ICD-10-CM

## 2015-07-10 DIAGNOSIS — Z95810 Presence of automatic (implantable) cardiac defibrillator: Secondary | ICD-10-CM

## 2015-07-10 DIAGNOSIS — I4729 Other ventricular tachycardia: Secondary | ICD-10-CM

## 2015-07-10 NOTE — Progress Notes (Signed)
Remote ICD transmission.   

## 2015-07-16 DIAGNOSIS — Z85828 Personal history of other malignant neoplasm of skin: Secondary | ICD-10-CM | POA: Diagnosis not present

## 2015-07-16 DIAGNOSIS — L84 Corns and callosities: Secondary | ICD-10-CM | POA: Diagnosis not present

## 2015-08-12 DIAGNOSIS — R972 Elevated prostate specific antigen [PSA]: Secondary | ICD-10-CM | POA: Diagnosis not present

## 2015-08-19 DIAGNOSIS — R972 Elevated prostate specific antigen [PSA]: Secondary | ICD-10-CM | POA: Diagnosis not present

## 2015-08-19 DIAGNOSIS — N5201 Erectile dysfunction due to arterial insufficiency: Secondary | ICD-10-CM | POA: Diagnosis not present

## 2015-08-19 DIAGNOSIS — Z0000 Encounter for general adult medical examination without abnormal findings: Secondary | ICD-10-CM | POA: Diagnosis not present

## 2015-08-20 ENCOUNTER — Encounter: Payer: Self-pay | Admitting: Cardiology

## 2015-08-20 LAB — CUP PACEART REMOTE DEVICE CHECK
HighPow Impedance: 57 Ohm
Implantable Lead Implant Date: 20101123
Implantable Lead Implant Date: 20101123
Implantable Lead Location: 753859
Implantable Lead Model: 7121
Lead Channel Impedance Value: 490 Ohm
Lead Channel Sensing Intrinsic Amplitude: 2.1 mV
Lead Channel Sensing Intrinsic Amplitude: 9.7 mV
Lead Channel Setting Pacing Amplitude: 2.5 V
Lead Channel Setting Pacing Amplitude: 2.5 V
MDC IDC LEAD LOCATION: 753860
MDC IDC MSMT LEADCHNL RV IMPEDANCE VALUE: 450 Ohm
MDC IDC SESS DTM: 20170523094832
MDC IDC SET LEADCHNL RV PACING PULSEWIDTH: 0.5 ms
MDC IDC SET LEADCHNL RV SENSING SENSITIVITY: 0.5 mV
Pulse Gen Serial Number: 7136426

## 2015-09-24 DIAGNOSIS — L84 Corns and callosities: Secondary | ICD-10-CM | POA: Diagnosis not present

## 2015-09-24 DIAGNOSIS — Z85828 Personal history of other malignant neoplasm of skin: Secondary | ICD-10-CM | POA: Diagnosis not present

## 2015-09-24 DIAGNOSIS — L570 Actinic keratosis: Secondary | ICD-10-CM | POA: Diagnosis not present

## 2015-10-09 ENCOUNTER — Ambulatory Visit (INDEPENDENT_AMBULATORY_CARE_PROVIDER_SITE_OTHER): Payer: Medicare Other | Admitting: *Deleted

## 2015-10-09 ENCOUNTER — Encounter: Payer: Self-pay | Admitting: Nurse Practitioner

## 2015-10-09 ENCOUNTER — Ambulatory Visit (INDEPENDENT_AMBULATORY_CARE_PROVIDER_SITE_OTHER): Payer: Medicare Other | Admitting: Nurse Practitioner

## 2015-10-09 VITALS — BP 120/80 | HR 60 | Ht 74.0 in | Wt 200.4 lb

## 2015-10-09 DIAGNOSIS — I472 Ventricular tachycardia: Secondary | ICD-10-CM

## 2015-10-09 DIAGNOSIS — Z7901 Long term (current) use of anticoagulants: Secondary | ICD-10-CM

## 2015-10-09 DIAGNOSIS — Z95810 Presence of automatic (implantable) cardiac defibrillator: Secondary | ICD-10-CM

## 2015-10-09 DIAGNOSIS — I4729 Other ventricular tachycardia: Secondary | ICD-10-CM

## 2015-10-09 DIAGNOSIS — I429 Cardiomyopathy, unspecified: Secondary | ICD-10-CM | POA: Diagnosis not present

## 2015-10-09 DIAGNOSIS — Z79899 Other long term (current) drug therapy: Secondary | ICD-10-CM | POA: Diagnosis not present

## 2015-10-09 DIAGNOSIS — I428 Other cardiomyopathies: Secondary | ICD-10-CM

## 2015-10-09 LAB — CBC
HCT: 46.5 % (ref 38.5–50.0)
Hemoglobin: 15.6 g/dL (ref 13.2–17.1)
MCH: 29.1 pg (ref 27.0–33.0)
MCHC: 33.5 g/dL (ref 32.0–36.0)
MCV: 86.6 fL (ref 80.0–100.0)
MPV: 10.1 fL (ref 7.5–12.5)
Platelets: 194 10*3/uL (ref 140–400)
RBC: 5.37 MIL/uL (ref 4.20–5.80)
RDW: 13.9 % (ref 11.0–15.0)
WBC: 6.7 10*3/uL (ref 3.8–10.8)

## 2015-10-09 NOTE — Patient Instructions (Addendum)
We will be checking the following labs today - Lipids, CBC, BMET, HPF, TSH   Medication Instructions:    Continue with your current medicines.     Testing/Procedures To Be Arranged:  Echocardiogram  Follow-Up:   See Dr. Rayann Heman in 6 months    Other Special Instructions:   Keep up the good work!    If you need a refill on your cardiac medications before your next appointment, please call your pharmacy.   Call the [place] office at [phone] if you have any questions, problems or concerns.

## 2015-10-09 NOTE — Progress Notes (Signed)
Remote ICD transmission.   

## 2015-10-09 NOTE — Progress Notes (Signed)
CARDIOLOGY OFFICE NOTE  Date:  10/09/2015    Robert Ochoa Date of Birth: 1945/04/26 Medical Record [id number]  PCP:  Lilian Coma, MD  Cardiologist:  [facility] Snare & Allred    Chief Complaint  Patient presents with  . Cardiomyopathy  . Atrial Fibrillation  . Congestive Heart Failure    6 month check - seen for Dr. Rayann Heman    History of Present Illness: Robert Ochoa is a 70 y.o. male who presents today for a 6 month check. Seen for Dr. Rayann Heman.   He has multiple medical issues which include past pneumonia with associated sepsis, renal failure and respiratory failure back in 2013. Has atrial fib - maintained in NSR on very low dose amiodarone. Has an ICD/pacemaker in place with a set lower rate of 60. Last EF was 35 to 40% back in October of 2013 and increased to 45 to 50% per echo from October of 2014. Normal EF by echo from 2016. He is managed medically. His other issues include right ventricular dysplasia, ICD implant, PAF and paroxysmal VT. He remains on chronic amiodarone and chronic Eliquis. Last PFT was January of 2017.  He was last seen here by Dr. Rayann Heman back in January - felt to be doing ok.  Comes in today. Here alone. He says he was told to go on a diet at his last visit - he does not believe in this - so he just cut back and has been very successful with weight loss. Feels pretty good. No chest pain. Rhythm ok. Not dizzy or lightheaded. Exercising regularly. He is happy with how he is doing. Not totally fasting today but needs surveillance labs.    Past Medical History  Diagnosis Date  . Ventricular tachycardia ([place])     ICD discharges 05/2011 for this & AF-RVR - started on amiodarone then.  . Arrhythmogenic right ventricular dysplasia ([place])     Hx of VT s/p dual-chamber St. Jude ICD. Cardiac MRI 2010 showed moderately dilated RV with hypokinesis, LV EF 47%. EF 50-55% 2010 but down to 45% 05/2011. Cath 01/2009 without significant CAD.  [name] Atrial fibrillation or  flutter     Paroxysmal. Started on Pradaxa 05/2011.  . Cardiac defibrillator in place   . Anxiety   . Septic shock(785.52) 10-27-11    with associated respiratory/renal failure/PAF/bradycardia  . LV dysfunction     per echo 10/2011; EF 35%    Past Surgical History  Procedure Laterality Date  . Appendectomy    . Cardiac defibrillator placement  02/19/09     St,. Jude   . Implantable cardioverter defibrillator generator change  03/01/13    for premature battery depletetion.  Now has a SJM Ellipse DR device placed by Dr Caryl Comes  . Implantable cardioverter defibrillator (icd) generator change N/A 03/01/2013    Procedure: ICD GENERATOR CHANGE;  Surgeon: [name], MD;  Location: [facility] CATH LAB;  Service: Cardiovascular;  Laterality: N/A;     Medications: Current Outpatient Prescriptions  Medication Sig Dispense Refill  . amiodarone (PACERONE) 200 MG tablet Take 0.5 tablets (100 mg total) by mouth daily. 45 tablet 3  . apixaban (ELIQUIS) 5 MG TABS tablet Take 1 tablet (5 mg total) by mouth 2 (two) times daily. 60 tablet 11  . Calcium-Magnesium-Zinc (CAL-MAG-ZINC PO) Take 2 tablets by mouth daily.     [name] lisinopril (PRINIVIL,ZESTRIL) 10 MG tablet TAKE 2 TABLETS BY MOUTH DAILY 180 tablet 3  . metoprolol tartrate (LOPRESSOR) 25 MG tablet TAKE 3 TABLETS  BY MOUTH TWICE DAILY 180 tablet 11  . Omega-3 Fatty Acids (FISH OIL) 1000 MG CAPS Take 3 capsules by mouth daily.     . sildenafil (VIAGRA) 100 MG tablet Take 1 tablet (100 mg total) by mouth daily as needed for erectile dysfunction. 10 tablet 0  . vitamin C (ASCORBIC ACID) 500 MG tablet Take 1,000 mg by mouth daily.      No current facility-administered medications for this visit.    Allergies: No Known Allergies  Social History: The patient  reports that he has never smoked. He has never used smokeless tobacco. He reports that he drinks alcohol. He reports that he does not use illicit drugs.   Family History: The patient's family history  includes Allergies in his mother; Heart attack in his father.   Review of Systems: Please see the history of present illness.   Otherwise, the review of systems is positive for none.   All other systems are reviewed and negative.   Physical Exam: VS:  BP 120/80 mmHg  Pulse 60  Ht 6\' 2"  (1.88 m)  Wt 200 lb 6.4 oz (90.901 kg)  BMI 25.72 kg/m2 .  BMI Body mass index is 25.72 kg/(m^2).  Wt Readings from Last 3 Encounters:  10/09/15 200 lb 6.4 oz (90.901 kg)  04/10/15 210 lb 3.2 oz (95.346 kg)  10/09/14 223 lb 12.8 oz (101.515 kg)    General: Pleasant. Well developed, well nourished and in no acute distress. His weight is down 23 pounds since I last saw him.  HEENT: Normal. Neck: Supple, no JVD, carotid bruits, or masses noted.  Cardiac: Regular rate and rhythm. No murmurs, rubs, or gallops. No edema.  Respiratory:  Lungs are clear to auscultation bilaterally with normal work of breathing.  GI: Soft and nontender.  MS: No deformity or atrophy. Gait and ROM intact. Skin: Warm and dry. Color is normal.  Neuro:  Strength and sensation are intact and no gross focal deficits noted.  Psych: Alert, appropriate and with normal affect.   LABORATORY DATA:  EKG:  EKG is ordered today. This demonstrates NSR.  Lab Results  Component Value Date   WBC 5.4 10/11/2014   HGB 15.5 10/11/2014   HCT 47.1 10/11/2014   PLT 192.0 10/11/2014   GLUCOSE 92 10/11/2014   CHOL 153 10/11/2014   TRIG 86.0 10/11/2014   HDL 33.80* 10/11/2014   LDLCALC 102* 10/11/2014   ALT 25 10/11/2014   AST 27 10/11/2014   NA 137 10/11/2014   K 4.6 10/11/2014   CL 105 10/11/2014   CREATININE 1.27 10/11/2014   BUN 21 10/11/2014   CO2 26 10/11/2014   TSH 1.36 10/11/2014   INR 1.18 10/30/2011    BNP (last 3 results) No results for input(s): BNP in the last 8760 hours.  ProBNP (last 3 results) No results for input(s): PROBNP in the last 8760 hours.   Other Studies Reviewed Today:  Echo Study Conclusions from  09/2014  - Left ventricle: The cavity size was normal. Wall thickness was  increased in a pattern of mild LVH. Systolic function was normal.  The estimated ejection fraction was in the range of 50% to 55%.  Doppler parameters are consistent with abnormal left ventricular  relaxation (grade 1 diastolic dysfunction). The E/e&' ratio is <8,  suggesting normal LV filling pressure. - Aorta: Aortic root dimension: 42 mm (ED). - Aortic root: The aortic root is dilated. - Left atrium: The atrium was normal in size. - Right atrium: The atrium  was mildly dilated. - Tricuspid valve: There was mild regurgitation. - Pulmonary arteries: PA peak pressure: 33 mm Hg (S). - Inferior vena cava: The vessel was normal in size. The  respirophasic diameter changes were in the normal range (>= 50%),  consistent with normal central venous pressure.  Impressions:  - LVEF 50-55%, incoordinate septal motion, AICD wires in place,  diastolic dysfunction, normal LV filling pressure, mildly dilated  aortic root to 4.2 cm, mildly dilated RA, mild TR, top normal  RVSP at 33 mmHg.  Assessment / Plan: 1. Systolic heart failure/nonischemic CM - EF is now normal by last echo. Looks compensated and very well today. No change in current regimen. Dr. Rayann Heman has wanted him to have repeat echo due to aortic root enlargement noted on last echo   2. ARVD - has his ICD in place.Followed by Dr. Rayann Heman   3. PAF - on amiodarone - needs surveillance labs today. Consider PFTs/CXR on return  4. Chronic anticoagulation - on Eliquis - no problems noted. CBC today.   Current medicines are reviewed with the patient today.  The patient does not have concerns regarding medicines other than what has been noted above.  The following changes have been made:  See above.  Labs/ tests ordered today include:    Orders Placed This Encounter  Procedures  . Basic metabolic panel  . CBC  . Hepatic function panel  . Lipid  panel  . TSH  . EKG 12-Lead     Disposition:   FU with Dr. Rayann Heman in 6 months.   Patient is agreeable to this plan and will call if any problems develop in the interim.   Signed: Burtis Junes, RN, ANP-C 10/09/2015 3:00 PM  [facility] [address] [place], [place]  [address] Phone: [phone] Fax: [phone]

## 2015-10-10 LAB — HEPATIC FUNCTION PANEL
ALT: 29 U/L (ref 9–46)
AST: 34 U/L (ref 10–35)
Albumin: 4.3 g/dL (ref 3.6–5.1)
Alkaline Phosphatase: 58 U/L (ref 40–115)
Bilirubin, Direct: 0.1 mg/dL (ref <=0.2)
Indirect Bilirubin: 0.6 mg/dL (ref 0.2–1.2)
Total Bilirubin: 0.7 mg/dL (ref 0.2–1.2)
Total Protein: 6.9 g/dL (ref 6.1–8.1)

## 2015-10-10 LAB — LIPID PANEL
Cholesterol: 169 mg/dL (ref 125–200)
HDL: 45 mg/dL (ref >=40)
LDL Cholesterol: 94 mg/dL (ref <130)
Total CHOL/HDL Ratio: 3.8 Ratio (ref <=5.0)
Triglycerides: 151 mg/dL — ABNORMAL HIGH (ref <150)
VLDL: 30 mg/dL (ref <30)

## 2015-10-10 LAB — BASIC METABOLIC PANEL
BUN: 25 mg/dL (ref 7–25)
CO2: 20 mmol/L (ref 20–31)
Calcium: 9.5 mg/dL (ref 8.6–10.3)
Chloride: 104 mmol/L (ref 98–110)
Creat: 1.22 mg/dL — ABNORMAL HIGH (ref 0.70–1.18)
Glucose, Bld: 82 mg/dL (ref 65–99)
Potassium: 4.9 mmol/L (ref 3.5–5.3)
Sodium: 137 mmol/L (ref 135–146)

## 2015-10-10 LAB — TSH: TSH: 0.91 mIU/L (ref 0.40–4.50)

## 2015-10-11 ENCOUNTER — Encounter: Payer: Self-pay | Admitting: Cardiology

## 2015-10-15 LAB — CUP PACEART REMOTE DEVICE CHECK
Brady Statistic RV Percent Paced: 15 %
HIGH POWER IMPEDANCE MEASURED VALUE: 57 Ohm
Implantable Lead Implant Date: 20101123
Implantable Lead Location: 753860
Implantable Lead Model: 7121
Lead Channel Impedance Value: 450 Ohm
Lead Channel Sensing Intrinsic Amplitude: 2.5 mV
MDC IDC LEAD IMPLANT DT: 20101123
MDC IDC LEAD LOCATION: 753859
MDC IDC MSMT LEADCHNL RV IMPEDANCE VALUE: 430 Ohm
MDC IDC MSMT LEADCHNL RV SENSING INTR AMPL: 8 mV
MDC IDC PG SERIAL: 7136426
MDC IDC SESS DTM: 20170718100209
MDC IDC STAT BRADY RA PERCENT PACED: 95 %

## 2015-10-17 DIAGNOSIS — H9190 Unspecified hearing loss, unspecified ear: Secondary | ICD-10-CM | POA: Diagnosis not present

## 2015-10-17 DIAGNOSIS — H6123 Impacted cerumen, bilateral: Secondary | ICD-10-CM | POA: Diagnosis not present

## 2015-10-24 DIAGNOSIS — H6122 Impacted cerumen, left ear: Secondary | ICD-10-CM | POA: Diagnosis not present

## 2015-10-24 DIAGNOSIS — H903 Sensorineural hearing loss, bilateral: Secondary | ICD-10-CM | POA: Diagnosis not present

## 2015-10-25 ENCOUNTER — Other Ambulatory Visit (HOSPITAL_COMMUNITY): Payer: Self-pay

## 2015-10-25 ENCOUNTER — Ambulatory Visit (HOSPITAL_COMMUNITY): Payer: Medicare Other | Attending: Internal Medicine

## 2015-10-25 DIAGNOSIS — Z7901 Long term (current) use of anticoagulants: Secondary | ICD-10-CM | POA: Diagnosis not present

## 2015-10-25 DIAGNOSIS — Z79899 Other long term (current) drug therapy: Secondary | ICD-10-CM | POA: Insufficient documentation

## 2015-10-25 DIAGNOSIS — I429 Cardiomyopathy, unspecified: Secondary | ICD-10-CM | POA: Diagnosis not present

## 2015-10-25 DIAGNOSIS — I517 Cardiomegaly: Secondary | ICD-10-CM | POA: Insufficient documentation

## 2015-10-25 DIAGNOSIS — Z95810 Presence of automatic (implantable) cardiac defibrillator: Secondary | ICD-10-CM | POA: Insufficient documentation

## 2015-10-25 DIAGNOSIS — I428 Other cardiomyopathies: Secondary | ICD-10-CM

## 2015-10-25 DIAGNOSIS — I509 Heart failure, unspecified: Secondary | ICD-10-CM | POA: Diagnosis present

## 2015-10-25 DIAGNOSIS — I472 Ventricular tachycardia: Secondary | ICD-10-CM | POA: Insufficient documentation

## 2015-10-25 DIAGNOSIS — I4729 Other ventricular tachycardia: Secondary | ICD-10-CM

## 2015-10-31 ENCOUNTER — Telehealth: Payer: Self-pay | Admitting: Nurse Practitioner

## 2015-10-31 NOTE — Telephone Encounter (Signed)
Follow Up:    Please call,said he just finished talking to you.

## 2015-10-31 NOTE — Telephone Encounter (Signed)
Spoke with pt and he states that after we spoke about his echo, he spoke with his partner.  Pt would like for Dr. Rayann Heman to call him and discuss more with him about his EF dropping.  Advised pt that I would send a message to Dr. Rayann Heman and his nurse.  Pt verbalized understanding and was appreciative for call back.

## 2015-11-03 NOTE — Telephone Encounter (Signed)
I am away and unable to call patient.  His EF was previously low.  This may not represent a new change. Lori and I have discussed patient at length last week.

## 2015-11-04 ENCOUNTER — Telehealth: Payer: Self-pay | Admitting: *Deleted

## 2015-11-04 DIAGNOSIS — H6122 Impacted cerumen, left ear: Secondary | ICD-10-CM | POA: Diagnosis not present

## 2015-11-04 DIAGNOSIS — H903 Sensorineural hearing loss, bilateral: Secondary | ICD-10-CM | POA: Diagnosis not present

## 2015-11-04 NOTE — Telephone Encounter (Signed)
Let me just see him back - ? Next week.

## 2015-11-04 NOTE — Telephone Encounter (Signed)
S/w pt is aware and agreeable will bring pt back with Dr.Allred in the office for further discussion on pt's echo results. Pt stated appreciation.

## 2015-11-13 ENCOUNTER — Ambulatory Visit (INDEPENDENT_AMBULATORY_CARE_PROVIDER_SITE_OTHER): Payer: Medicare Other | Admitting: Nurse Practitioner

## 2015-11-13 ENCOUNTER — Encounter: Payer: Self-pay | Admitting: Nurse Practitioner

## 2015-11-13 VITALS — BP 118/80 | HR 64 | Ht 74.0 in | Wt 203.4 lb

## 2015-11-13 DIAGNOSIS — I429 Cardiomyopathy, unspecified: Secondary | ICD-10-CM | POA: Diagnosis not present

## 2015-11-13 DIAGNOSIS — Z7901 Long term (current) use of anticoagulants: Secondary | ICD-10-CM

## 2015-11-13 DIAGNOSIS — I428 Other cardiomyopathies: Secondary | ICD-10-CM

## 2015-11-13 DIAGNOSIS — Z95810 Presence of automatic (implantable) cardiac defibrillator: Secondary | ICD-10-CM

## 2015-11-13 DIAGNOSIS — Z79899 Other long term (current) drug therapy: Secondary | ICD-10-CM | POA: Diagnosis not present

## 2015-11-13 NOTE — Patient Instructions (Addendum)
We will be checking the following labs today - NONE   Medication Instructions:    Continue with your current medicines.     Testing/Procedures To Be Arranged:  Repeat echocardiogram in 6 months  Follow-Up:   See me a few days after the ultrasound for discussion    Other Special Instructions:   N/A    If you need a refill on your cardiac medications before your next appointment, please call your pharmacy.   Call the [place] office at [phone] if you have any questions, problems or concerns.

## 2015-11-13 NOTE — Progress Notes (Signed)
CARDIOLOGY OFFICE NOTE  Date:  11/13/2015    Robert Ochoa Date of Birth: [birth year] Medical Record [id number]  PCP:  Lilian Coma, MD  Cardiologist:  Serv[facility] Snare & Allred    Chief Complaint  Patient presents with  . Cardiomyopathy    Follow up visit after echo - seen for Dr. Rayann Heman    History of Present Illness: Robert Ochoa is a 70 y.o. male who presents today for a work in visit. Seen for Dr. Rayann Heman.   He has multiple medical issues which include past pneumonia with associated sepsis, renal failure and respiratory failure back in 2013. Has atrial fib - maintained in NSR on very low dose amiodarone. Has an ICD/pacemaker in place with a set lower rate of 60. Last EF was 35 to 40% back in October of 2013 and increased to 45 to 50% per echo from October of 2014. Normal EF by echo from 2016. He is managed medically. His other issues include right ventricular dysplasia, ICD implant, PAF and paroxysmal VT. He remains on chronic amiodarone and chronic Eliquis. Last PFT was January of 2017. No CAD per cath back in 2010.   He was last seen here by Dr. Rayann Heman back in January - felt to be doing ok.  I just saw him last month - he was actively losing weight. He was doing great and happy with how he was feeling. We got his echo updated due to aortic root enlargement - this has showed that his EF has dropped back - he is not able to have an MRI due his device implant. He is on a good CHF regimen. His study was reviewed with Dr. Rayann Heman who wanted to keep him on his current regimen and follow him along.   Comes in today. Here alone. Here to discuss the echo. He continues to feel well and has no complaint whatsoever.   Past Medical History:  Diagnosis Date  . Anxiety   . Arrhythmogenic right ventricular dysplasia ([facility])    Hx of VT s/p dual-chamber St. Jude ICD. Cardiac MRI 2010 showed moderately dilated RV with hypokinesis, LV EF 47%. EF 50-55% 2010 but down to 45% 05/2011. Cath 01/2009  without significant CAD.  [name] Atrial fibrillation or flutter    Paroxysmal. Started on Pradaxa 05/2011.  . Cardiac defibrillator in place   . LV dysfunction    per echo 10/2011; EF 35%  . Septic shock(785.52) 10-27-11   with associated respiratory/renal failure/PAF/bradycardia  . Ventricular tachycardia ([place])    ICD discharges 05/2011 for this & AF-RVR - started on amiodarone then.    Past Surgical History:  Procedure Laterality Date  . APPENDECTOMY    . CARDIAC DEFIBRILLATOR PLACEMENT  02/19/09    St,. Jude   . IMPLANTABLE CARDIOVERTER DEFIBRILLATOR (ICD) GENERATOR CHANGE N/A 03/01/2013   Procedure: ICD GENERATOR CHANGE;  Surgeon: [name], MD;  Location: [facility] CATH LAB;  Service: Cardiovascular;  Laterality: N/A;  . IMPLANTABLE CARDIOVERTER DEFIBRILLATOR GENERATOR CHANGE  03/01/13   for premature battery depletetion.  Now has a SJM Ellipse DR device placed by Dr Caryl Comes     Medications: Current Outpatient Prescriptions  Medication Sig Dispense Refill  . amiodarone (PACERONE) 200 MG tablet Take 0.5 tablets (100 mg total) by mouth daily. 45 tablet 3  . apixaban (ELIQUIS) 5 MG TABS tablet Take 1 tablet (5 mg total) by mouth 2 (two) times daily. 60 tablet 11  . Calcium-Magnesium-Zinc (CAL-MAG-ZINC PO) Take 2 tablets by mouth daily.     [name]  lisinopril (PRINIVIL,ZESTRIL) 10 MG tablet TAKE 2 TABLETS BY MOUTH DAILY 180 tablet 3  . metoprolol tartrate (LOPRESSOR) 25 MG tablet TAKE 3 TABLETS BY MOUTH TWICE DAILY 180 tablet 11  . Omega-3 Fatty Acids (FISH OIL) 1000 MG CAPS Take 3 capsules by mouth daily.     . sildenafil (VIAGRA) 100 MG tablet Take 1 tablet (100 mg total) by mouth daily as needed for erectile dysfunction. 10 tablet 0  . vitamin C (ASCORBIC ACID) 500 MG tablet Take 1,000 mg by mouth daily.      No current facility-administered medications for this visit.     Allergies: No Known Allergies  Social History: The patient  reports that he has never smoked. He has never used  smokeless tobacco. He reports that he drinks alcohol. He reports that he does not use drugs.   Family History: The patient's family history includes Allergies in his mother; Heart attack in his father.   Review of Systems: Please see the history of present illness.   Otherwise, the review of systems is positive for none.   All other systems are reviewed and negative.   Physical Exam: VS:  BP 118/80   Pulse 64   Ht 6\' 2"  (1.88 m)   Wt 203 lb 6.4 oz (92.3 kg)   BMI 26.12 kg/m  .  BMI Body mass index is 26.12 kg/m.  Wt Readings from Last 3 Encounters:  11/13/15 203 lb 6.4 oz (92.3 kg)  10/09/15 200 lb 6.4 oz (90.9 kg)  04/10/15 210 lb 3.2 oz (95.3 kg)    General: Pleasant. Well developed, well nourished and in no acute distress.   MS: No deformity or atrophy. Gait and ROM intact.  Skin: Warm and dry. Color is normal.  Neuro:  Strength and sensation are intact and no gross focal deficits noted.  Psych: Alert, appropriate and with normal affect.   LABORATORY DATA:  EKG:  EKG is not ordered today.  Lab Results  Component Value Date   WBC 6.7 10/09/2015   HGB 15.6 10/09/2015   HCT 46.5 10/09/2015   PLT 194 10/09/2015   GLUCOSE 82 10/09/2015   CHOL 169 10/09/2015   TRIG 151 (H) 10/09/2015   HDL 45 10/09/2015   LDLCALC 94 10/09/2015   ALT 29 10/09/2015   AST 34 10/09/2015   NA 137 10/09/2015   K 4.9 10/09/2015   CL 104 10/09/2015   CREATININE 1.22 (H) 10/09/2015   BUN 25 10/09/2015   CO2 20 10/09/2015   TSH 0.91 10/09/2015   INR 1.18 10/30/2011    BNP (last 3 results) No results for input(s): BNP in the last 8760 hours.  ProBNP (last 3 results) No results for input(s): PROBNP in the last 8760 hours.   Other Studies Reviewed Today:  Echo Study Conclusions from 09/2015  - Left ventricle: The cavity size was mildly dilated. Wall   thickness was normal. Systolic function was moderately to   severely reduced. The estimated ejection fraction was in the   range  of 30% to 35%. Doppler parameters are consistent with   abnormal left ventricular relaxation (grade 1 diastolic   dysfunction). - Right ventricle: The cavity size was mildly dilated. Systolic   function was moderately to severely reduced. - Right atrium: The atrium was mildly dilated.    Echo Study Conclusions from 09/2014  - Left ventricle: The cavity size was normal. Wall thickness was  increased in a pattern of mild LVH. Systolic function was normal.  The estimated  ejection fraction was in the range of 50% to 55%.  Doppler parameters are consistent with abnormal left ventricular  relaxation (grade 1 diastolic dysfunction). The E/e&' ratio is <8,  suggesting normal LV filling pressure. - Aorta: Aortic root dimension: 42 mm (ED). - Aortic root: The aortic root is dilated. - Left atrium: The atrium was normal in size. - Right atrium: The atrium was mildly dilated. - Tricuspid valve: There was mild regurgitation. - Pulmonary arteries: PA peak pressure: 33 mm Hg (S). - Inferior vena cava: The vessel was normal in size. The  respirophasic diameter changes were in the normal range (>= 50%),  consistent with normal central venous pressure.  Impressions:  - LVEF 50-55%, incoordinate septal motion, AICD wires in place,  diastolic dysfunction, normal LV filling pressure, mildly dilated  aortic root to 4.2 cm, mildly dilated RA, mild TR, top normal  RVSP at 33 mmHg.   Echo Study Conclusions from 2014  - Left ventricle: The cavity size was normal. Wall thickness was normal. Systolic function was mildly reduced. The estimated ejection fraction was in the range of 45% to 50%. Diffuse hypokinesis. Doppler parameters are consistent with abnormal left ventricular relaxation (grade 1 diastolic dysfunction). - Left atrium: The atrium was mildly dilated. - Right ventricle: The cavity size was moderately to severely dilated. Systolic function was  moderately reduced. - Right atrium: The atrium was moderately dilated. - Atrial septum: There was an atrial septal aneurysm. - Pulmonary arteries: PA peak pressure: 19mm Hg (S).  Assessment / Plan: 1. Systolic heart failure/nonischemic CM - EF has dropped back to 30 to 35% - he is on good CHF regimen. Hesitant to add aldactone given his last potassium and creatinine. He feels great. He would like to continue with his current regimen for now, call if he has any symptoms. Will plan on repeating his echo in 6 months - if we have worsening symptoms then I think we will need to try and do something at that time.   2. ARVD - has his ICD in place.Followed by Dr. Rayann Heman   3. PAF - on amiodarone - needs surveillance labs today. Consider PFTs/CXR on return visit in 6 months.   4. Chronic anticoagulation - on Eliquis - no problems noted.   Current medicines are reviewed with the patient today.  The patient does not have concerns regarding medicines other than what has been noted above.  The following changes have been made:  See above.  Labs/ tests ordered today include:    Orders Placed This Encounter  Procedures  . ECHOCARDIOGRAM COMPLETE     Disposition:   FU with me as planned in 6 months with echo prior.    Patient is agreeable to this plan and will call if any problems develop in the interim.   Signed: Burtis Junes, RN, ANP-C 11/13/2015 12:05 PM  [facility] [address] [place], [place]  [address] Phone: [phone] Fax: [phone]

## 2015-12-05 ENCOUNTER — Other Ambulatory Visit: Payer: Self-pay

## 2015-12-18 ENCOUNTER — Encounter: Payer: Self-pay | Admitting: *Deleted

## 2016-01-06 ENCOUNTER — Telehealth: Payer: Self-pay | Admitting: Nurse Practitioner

## 2016-01-06 NOTE — Telephone Encounter (Signed)
left 2 boxes of eliquis 5 mg up front, pt aware

## 2016-01-06 NOTE — Telephone Encounter (Signed)
Patient calling the office for samples of medication:   1.  What medication and dosage are you requesting samples for? Eliquis 5mg  2x day  2.  Are you currently out of this medication? No, 3 days left

## 2016-01-08 ENCOUNTER — Ambulatory Visit (INDEPENDENT_AMBULATORY_CARE_PROVIDER_SITE_OTHER): Payer: Medicare Other | Admitting: *Deleted

## 2016-01-08 DIAGNOSIS — I472 Ventricular tachycardia: Secondary | ICD-10-CM | POA: Diagnosis not present

## 2016-01-08 DIAGNOSIS — I4729 Other ventricular tachycardia: Secondary | ICD-10-CM

## 2016-01-08 LAB — CUP PACEART REMOTE DEVICE CHECK
Brady Statistic RA Percent Paced: 94 %
Brady Statistic RV Percent Paced: 16 %
HIGH POWER IMPEDANCE MEASURED VALUE: 54 Ohm
Implantable Lead Implant Date: 20101123
Implantable Lead Implant Date: 20101123
Implantable Lead Location: 753859
Lead Channel Impedance Value: 430 Ohm
Lead Channel Impedance Value: 490 Ohm
Lead Channel Pacing Threshold Amplitude: 1 V
Lead Channel Pacing Threshold Pulse Width: 0.5 ms
Lead Channel Sensing Intrinsic Amplitude: 2.1 mV
MDC IDC LEAD LOCATION: 753860
MDC IDC LEAD MODEL: 7121
MDC IDC MSMT BATTERY REMAINING LONGEVITY: 55 mo
MDC IDC MSMT LEADCHNL RV PACING THRESHOLD AMPLITUDE: 1 V
MDC IDC MSMT LEADCHNL RV PACING THRESHOLD PULSEWIDTH: 0.5 ms
MDC IDC MSMT LEADCHNL RV SENSING INTR AMPL: 8.1 mV
MDC IDC PG IMPLANT DT: 20141203
MDC IDC SESS DTM: 20180104122835
Pulse Gen Serial Number: 7136426

## 2016-01-08 NOTE — Progress Notes (Signed)
Remote ICD transmission.   

## 2016-01-09 ENCOUNTER — Encounter: Payer: Self-pay | Admitting: Cardiology

## 2016-01-20 DIAGNOSIS — Z23 Encounter for immunization: Secondary | ICD-10-CM | POA: Diagnosis not present

## 2016-01-22 ENCOUNTER — Telehealth: Payer: Self-pay | Admitting: Internal Medicine

## 2016-01-22 NOTE — Telephone Encounter (Signed)
New Message  Patient calling the office for samples of medication:   1.  What medication and dosage are you requesting samples for? Eliquis 5 mg tablets twice daily One Pill left  2.  Are you currently out of this medication?  Pt has only one pill left

## 2016-01-22 NOTE — Telephone Encounter (Signed)
Spoke with patient and made him aware that I will leave two boxes at the front desk for pick up.

## 2016-02-03 ENCOUNTER — Ambulatory Visit: Payer: Medicare Other | Admitting: Nurse Practitioner

## 2016-02-06 ENCOUNTER — Telehealth: Payer: Self-pay | Admitting: Internal Medicine

## 2016-02-06 NOTE — Telephone Encounter (Signed)
New message       Patient calling the office for samples of medication:   1.  What medication and dosage are you requesting samples for? eliquis 5mg   2.  Are you currently out of this medication? Pt is out of medication

## 2016-02-06 NOTE — Telephone Encounter (Signed)
Patient aware samples will be placed at the front desk. 

## 2016-02-18 ENCOUNTER — Telehealth: Payer: Self-pay | Admitting: Internal Medicine

## 2016-02-18 NOTE — Telephone Encounter (Signed)
LMOM that samples would be placed at the front desk.

## 2016-02-18 NOTE — Telephone Encounter (Signed)
Patient calling the office for samples of medication:   1.  What medication and dosage are you requesting samples for?Eliquis 5 mg 2 times a day 2.  Are you currently out of this medication? Pt is in the donut hole,wiould like pick them up by tomrrow

## 2016-03-04 ENCOUNTER — Telehealth: Payer: Self-pay | Admitting: Internal Medicine

## 2016-03-04 NOTE — Telephone Encounter (Signed)
Patient aware samples will be placed at the front desk. 

## 2016-03-04 NOTE — Telephone Encounter (Signed)
New Message  Patient calling the office for samples of medication:   1.  What medication and dosage are you requesting samples for? apixaban (Eliquis) 5 mg tablets twice daily totaling 5 mg tablets  2.  Are you currently out of this medication?  Yes

## 2016-03-18 ENCOUNTER — Telehealth: Payer: Self-pay | Admitting: Internal Medicine

## 2016-03-18 NOTE — Telephone Encounter (Signed)
New Message  Patient calling the office for samples of medication:   1.  What medication and dosage are you requesting samples for? apixaban (eliquis) 5 mg tablet by mouth twice daily  2.  Are you currently out of this medication?  Yes

## 2016-03-18 NOTE — Telephone Encounter (Signed)
2 boxes of samples placed at front desk, pt aware

## 2016-03-18 NOTE — Telephone Encounter (Signed)
Dr Rayann Heman does not have any samples currently.  Please give patient some if any available

## 2016-04-06 DIAGNOSIS — L84 Corns and callosities: Secondary | ICD-10-CM | POA: Diagnosis not present

## 2016-04-06 DIAGNOSIS — L821 Other seborrheic keratosis: Secondary | ICD-10-CM | POA: Diagnosis not present

## 2016-04-06 DIAGNOSIS — Z85828 Personal history of other malignant neoplasm of skin: Secondary | ICD-10-CM | POA: Diagnosis not present

## 2016-04-06 DIAGNOSIS — L570 Actinic keratosis: Secondary | ICD-10-CM | POA: Diagnosis not present

## 2016-04-23 ENCOUNTER — Other Ambulatory Visit: Payer: Self-pay | Admitting: Internal Medicine

## 2016-04-29 ENCOUNTER — Other Ambulatory Visit: Payer: Self-pay | Admitting: Internal Medicine

## 2016-04-29 NOTE — Telephone Encounter (Signed)
Pt last saw Truitt Merle, NP on 11/13/15, has f/u appt with her on 05/20/16. Last labs 10/09/15 Creat 1.22, age 72 y/o, weight 92.3 kg, per specified criteria Eliquis 5mg  BID is appropriate dosing will refill rx x 6 months.

## 2016-05-12 ENCOUNTER — Other Ambulatory Visit: Payer: Self-pay

## 2016-05-12 ENCOUNTER — Encounter: Payer: Self-pay | Admitting: Physician Assistant

## 2016-05-12 ENCOUNTER — Ambulatory Visit (HOSPITAL_COMMUNITY): Payer: Medicare Other | Attending: Cardiology

## 2016-05-12 DIAGNOSIS — I429 Cardiomyopathy, unspecified: Secondary | ICD-10-CM | POA: Diagnosis present

## 2016-05-12 DIAGNOSIS — I428 Other cardiomyopathies: Secondary | ICD-10-CM | POA: Diagnosis not present

## 2016-05-12 DIAGNOSIS — I472 Ventricular tachycardia: Secondary | ICD-10-CM | POA: Diagnosis not present

## 2016-05-12 DIAGNOSIS — I5189 Other ill-defined heart diseases: Secondary | ICD-10-CM | POA: Insufficient documentation

## 2016-05-12 DIAGNOSIS — I77810 Thoracic aortic ectasia: Secondary | ICD-10-CM | POA: Diagnosis not present

## 2016-05-12 DIAGNOSIS — I517 Cardiomegaly: Secondary | ICD-10-CM | POA: Insufficient documentation

## 2016-05-12 DIAGNOSIS — R001 Bradycardia, unspecified: Secondary | ICD-10-CM | POA: Insufficient documentation

## 2016-05-15 ENCOUNTER — Ambulatory Visit: Payer: Medicare Other | Admitting: Nurse Practitioner

## 2016-05-20 ENCOUNTER — Telehealth: Payer: Self-pay | Admitting: Nurse Practitioner

## 2016-05-20 ENCOUNTER — Ambulatory Visit
Admission: RE | Admit: 2016-05-20 | Discharge: 2016-05-20 | Disposition: A | Discharge status: Home / Self Care | Payer: Medicare Other | Source: Ambulatory Visit | Attending: Nurse Practitioner | Admitting: Nurse Practitioner

## 2016-05-20 ENCOUNTER — Encounter: Payer: Self-pay | Admitting: Nurse Practitioner

## 2016-05-20 ENCOUNTER — Ambulatory Visit (INDEPENDENT_AMBULATORY_CARE_PROVIDER_SITE_OTHER): Payer: Medicare Other | Admitting: Nurse Practitioner

## 2016-05-20 VITALS — BP 118/80 | HR 61 | Ht 74.0 in | Wt 208.8 lb

## 2016-05-20 DIAGNOSIS — Z95810 Presence of automatic (implantable) cardiac defibrillator: Secondary | ICD-10-CM | POA: Diagnosis not present

## 2016-05-20 DIAGNOSIS — I428 Other cardiomyopathies: Secondary | ICD-10-CM

## 2016-05-20 DIAGNOSIS — I480 Paroxysmal atrial fibrillation: Secondary | ICD-10-CM | POA: Diagnosis not present

## 2016-05-20 DIAGNOSIS — Q249 Congenital malformation of heart, unspecified: Secondary | ICD-10-CM | POA: Diagnosis not present

## 2016-05-20 DIAGNOSIS — R918 Other nonspecific abnormal finding of lung field: Secondary | ICD-10-CM | POA: Diagnosis not present

## 2016-05-20 DIAGNOSIS — Z7901 Long term (current) use of anticoagulants: Secondary | ICD-10-CM | POA: Diagnosis not present

## 2016-05-20 DIAGNOSIS — Q208 Other congenital malformations of cardiac chambers and connections: Secondary | ICD-10-CM

## 2016-05-20 DIAGNOSIS — E7800 Pure hypercholesterolemia, unspecified: Secondary | ICD-10-CM | POA: Diagnosis not present

## 2016-05-20 DIAGNOSIS — Z79899 Other long term (current) drug therapy: Secondary | ICD-10-CM | POA: Diagnosis not present

## 2016-05-20 NOTE — Progress Notes (Signed)
CARDIOLOGY OFFICE NOTE  Date:  05/20/2016    Robert Ochoa Date of Birth: 1945-09-05 Medical Record [id number]  PCP:  Lilian Coma, MD  Cardiologist:  [facility]   Chief Complaint  Patient presents with  . Cardiomyopathy    Follow up visit - seen for Dr. Rayann Heman    History of Present Illness: Robert Ochoa is a 71 y.o. male who presents today for a 6 month check. Seen for Dr. Rayann Heman.   He has multiple medical issues which include past pneumonia with associated sepsis, renal failure and respiratory failure back in 2013. Has atrial fib - maintained in NSR on very low dose amiodarone. Has an ICD/pacemaker in place with a set lower rate of 60. Last EF was 35 to 40% back in October of 2013 and increased to 45 to 50% per echo from October of 2014. Normal EF by echo from 2016. He is managed medically. His other issues include right ventricular dysplasia, ICD implant, PAF and paroxysmal VT. He remains on chronic amiodarone and chronic Eliquis. Last PFT was January of 2017. No CAD per cath back in 2010.   He was last seen here by Dr. Rayann Heman back in January of 2017 - felt to be doing ok.  I just saw him back in August - he was actively losing weight. He was doing great and happy with how he was feeling. We got his echo updated due to aortic root enlargement - this has showed that his EF had dropped back - he is not able to have an MRI due his device implant. He is on a good CHF regimen. His study was reviewed with Dr. Rayann Heman who wanted to keep him on his current regimen and follow him along. Patient was agreeable to continuing with his current management with plans to repeat echo 6 months later.  Comes in today. Here alone. He had his repeat echo earlier this month - EF 40 to 45%. He is still doing well. No chest pain. Not short of breath. Exercising regularly. Not dizzy or lightheaded. He is very happy with how he is doing. Not fasting today. Needs surveillance labs.  Last PFTs  were in January of 2017 - minimal defect. No recent CXR since 2015. Did not get his recall for EP - overdue for device check.   Past Medical History:  Diagnosis Date  . Anxiety   . Arrhythmogenic right ventricular dysplasia (HCC)    Hx of VT s/p dual-chamber St. Jude ICD. Cardiac MRI 2010 showed moderately dilated RV with hypokinesis, LV EF 47%. EF 50-55% 2010 but down to 45% 05/2011. Cath 01/2009 without significant CAD.  [name] Atrial fibrillation or flutter    Paroxysmal. Started on Pradaxa 05/2011.  . Cardiac defibrillator in place   . LV dysfunction    per echo 10/2011; EF 35%  // Echo 2/18: EF 40-45, diff HK, Gr 1 DD, mild dilated aortic root, mod LAE, mod RVE, mild reduced RVSF, mod RAE  . Septic shock(785.52) 10-27-11   with associated respiratory/renal failure/PAF/bradycardia  . Ventricular tachycardia ([place])    ICD discharges 05/2011 for this & AF-RVR - started on amiodarone then.    Past Surgical History:  Procedure Laterality Date  . APPENDECTOMY    . CARDIAC DEFIBRILLATOR PLACEMENT  02/19/09    St,. Jude   . IMPLANTABLE CARDIOVERTER DEFIBRILLATOR (ICD) GENERATOR CHANGE N/A 03/01/2013   Procedure: ICD GENERATOR CHANGE;  Surgeon: [name], MD;  Location: [facility] CATH LAB;  Service: Cardiovascular;  Laterality: N/A;  . IMPLANTABLE CARDIOVERTER DEFIBRILLATOR GENERATOR CHANGE  03/01/13   for premature battery depletetion.  Now has a SJM Ellipse DR device placed by Dr Caryl Comes     Medications: Current Outpatient Prescriptions  Medication Sig Dispense Refill  . amiodarone (PACERONE) 200 MG tablet Take 0.5 tablets (100 mg total) by mouth daily. 45 tablet 3  . Calcium-Magnesium-Zinc (CAL-MAG-ZINC PO) Take 2 tablets by mouth daily.     [name] ELIQUIS 5 MG TABS tablet TAKE 1 TABLET BY MOUTH TWICE DAILY 60 tablet 5  . lisinopril (PRINIVIL,ZESTRIL) 10 MG tablet TAKE 2 TABLETS BY MOUTH DAILY 180 tablet 3  . metoprolol tartrate (LOPRESSOR) 25 MG tablet TAKE 3 TABLETS BY MOUTH TWICE DAILY 180 tablet 5    . Omega-3 Fatty Acids (FISH OIL) 1000 MG CAPS Take 3 capsules by mouth daily.     . sildenafil (VIAGRA) 100 MG tablet Take 1 tablet (100 mg total) by mouth daily as needed for erectile dysfunction. 10 tablet 0  . vitamin C (ASCORBIC ACID) 500 MG tablet Take 1,000 mg by mouth daily.      No current facility-administered medications for this visit.     Allergies: No Known Allergies  Social History: The patient  reports that he has never smoked. He has never used smokeless tobacco. He reports that he drinks alcohol. He reports that he does not use drugs.   Family History: The patient's family history includes Allergies in his mother; Heart attack in his father.   Review of Systems: Please see the history of present illness.   Otherwise, the review of systems is positive for none.   All other systems are reviewed and negative.   Physical Exam: VS:  BP 118/80   Pulse 61   Ht 6\' 2"  (1.88 m)   Wt 208 lb 12.8 oz (94.7 kg)   SpO2 97% Comment: at rest  BMI 26.81 kg/m  .  BMI Body mass index is 26.81 kg/m.  Wt Readings from Last 3 Encounters:  05/20/16 208 lb 12.8 oz (94.7 kg)  11/13/15 203 lb 6.4 oz (92.3 kg)  10/09/15 200 lb 6.4 oz (90.9 kg)    General: Pleasant. Well developed, well nourished and in no acute distress.   HEENT: Normal.  Neck: Supple, no JVD, carotid bruits, or masses noted.  Cardiac: Regular rate and rhythm. No murmurs, rubs, or gallops. No edema.  Respiratory:  Lungs are clear to auscultation bilaterally with normal work of breathing.  GI: Soft and nontender.  MS: No deformity or atrophy. Gait and ROM intact.  Skin: Warm and dry. Color is normal.  Neuro:  Strength and sensation are intact and no gross focal deficits noted.  Psych: Alert, appropriate and with normal affect.   LABORATORY DATA:  EKG:  EKG is not ordered today.  Lab Results  Component Value Date   WBC 6.7 10/09/2015   HGB 15.6 10/09/2015   HCT 46.5 10/09/2015   PLT 194 10/09/2015    GLUCOSE 82 10/09/2015   CHOL 169 10/09/2015   TRIG 151 (H) 10/09/2015   HDL 45 10/09/2015   LDLCALC 94 10/09/2015   ALT 29 10/09/2015   AST 34 10/09/2015   NA 137 10/09/2015   K 4.9 10/09/2015   CL 104 10/09/2015   CREATININE 1.22 (H) 10/09/2015   BUN 25 10/09/2015   CO2 20 10/09/2015   TSH 0.91 10/09/2015   INR 1.18 10/30/2011    BNP (last 3 results) No results for input(s): BNP in the last  8760 hours.  ProBNP (last 3 results) No results for input(s): PROBNP in the last 8760 hours.   Other Studies Reviewed Today:  Echo Study Conclusions from 04/2016  - Left ventricle: The cavity size was normal. Wall thickness was   normal. Systolic function was mildly to moderately reduced. The   estimated ejection fraction was in the range of 40% to 45%.   Diffuse hypokinesis. Doppler parameters are consistent with   abnormal left ventricular relaxation (grade 1 diastolic   dysfunction). - Aortic root: The aortic root was mildly dilated. - Left atrium: The atrium was moderately dilated. - Right ventricle: The cavity size was moderately dilated. Systolic   function was mildly reduced. - Right atrium: The atrium was moderately dilated.  Impressions:  - Mild to moderate global reduction in LV systolic function; grade   1 diastolic dysfunction; moderate biatrial enlargement; moderate   RVE with mildly reduced function.   Echo Study Conclusions from 09/2015  - Left ventricle: The cavity size was mildly dilated. Wall thickness was normal. Systolic function was moderately to severely reduced. The estimated ejection fraction was in the range of 30% to 35%. Doppler parameters are consistent with abnormal left ventricular relaxation (grade 1 diastolic dysfunction). - Right ventricle: The cavity size was mildly dilated. Systolic function was moderately to severely reduced. - Right atrium: The atrium was mildly dilated.   Echo Study Conclusions from 09/2014  -  Left ventricle: The cavity size was normal. Wall thickness was  increased in a pattern of mild LVH. Systolic function was normal.  The estimated ejection fraction was in the range of 50% to 55%.  Doppler parameters are consistent with abnormal left ventricular  relaxation (grade 1 diastolic dysfunction). The E/e&' ratio is <8,  suggesting normal LV filling pressure. - Aorta: Aortic root dimension: 42 mm (ED). - Aortic root: The aortic root is dilated. - Left atrium: The atrium was normal in size. - Right atrium: The atrium was mildly dilated. - Tricuspid valve: There was mild regurgitation. - Pulmonary arteries: PA peak pressure: 33 mm Hg (S). - Inferior vena cava: The vessel was normal in size. The  respirophasic diameter changes were in the normal range (>= 50%),  consistent with normal central venous pressure.  Impressions:  - LVEF 50-55%, incoordinate septal motion, AICD wires in place,  diastolic dysfunction, normal LV filling pressure, mildly dilated  aortic root to 4.2 cm, mildly dilated RA, mild TR, top normal  RVSP at 33 mmHg.   Echo Study Conclusions from 2014  - Left ventricle: The cavity size was normal. Wall thickness was normal. Systolic function was mildly reduced. The estimated ejection fraction was in the range of 45% to 50%. Diffuse hypokinesis. Doppler parameters are consistent with abnormal left ventricular relaxation (grade 1 diastolic dysfunction). - Left atrium: The atrium was mildly dilated. - Right ventricle: The cavity size was moderately to severely dilated. Systolic function was moderately reduced. - Right atrium: The atrium was moderately dilated. - Atrial septum: There was an atrial septal aneurysm. - Pulmonary arteries: PA peak pressure: 69mm Hg (S).  Assessment / Plan: 1. Systolic heart failure/nonischemic CM - EF back to 40 to 45%. He is totally asymptomatic.   He feels great. He would like to continue with his  current regimen for now, call if he has any symptoms.   2. ARVD - has his ICD in place.Followed by Dr. Rayann Heman - needs visit arranged.   3. PAF - on amiodarone - needs surveillance labs - will get  on Friday with fasting lipids. Arrange for CXR today. PFTS arranged as well.    4. Chronic anticoagulation - on Eliquis - no problems noted. Lab on Friday  5. Enlarged aortic root - will set him up for CT angio of the chest in 6 months. Has good BP control.   Current medicines are reviewed with the patient today.  The patient does not have concerns regarding medicines other than what has been noted above.  The following changes have been made:  See above.  Labs/ tests ordered today include:    Orders Placed This Encounter  Procedures  . CT ANGIO CHEST AORTA W &/OR WO CONTRAST  . DG Chest 2 View  . Basic metabolic panel  . Hepatic function panel  . Lipid panel  . TSH  . CBC  . Pulmonary function test     Disposition:   Dr. Rayann Heman has an opening on Friday - will arrange for his device check/EKG. See me in 6 months.    Patient is agreeable to this plan and will call if any problems develop in the interim.   SignedTruitt Merle, NP  05/20/2016 12:41 PM  [facility] [address] [place], [place]  [address] Phone: [phone] Fax: [phone]

## 2016-05-20 NOTE — Telephone Encounter (Signed)
Pt stated after calling back Stacy from office the scheduler stated so you want results.  Pt was very confused. I s/w pt to let pt know Stacy from CT was trying to make pt's appointment. Stated if someone dosen't call back from office will try calling office again to schedule appointment.

## 2016-05-20 NOTE — Telephone Encounter (Signed)
New message     Someone named Robert Ochoa called him about results? The only test he had done was Chest xray

## 2016-05-20 NOTE — Patient Instructions (Addendum)
We will be checking the following labs today - NONE  Fasting labs on Friday - BMET, CBC, HPF, Lipids and TSH  Please go to [facility] to [place] on the first floor for a chest Xray - you may walk in.    Medication Instructions:    Continue with your current medicines.     Testing/Procedures To Be Arranged:  CT angio of the chest to evaluate/measure aorta in 6 months  PFTs with diffusion  Follow-Up:   See me in 6 months with fasting labs  Needs visit with Dr. Rayann Heman for device check    Other Special Instructions:   N/A    If you need a refill on your cardiac medications before your next appointment, please call your pharmacy.   Call the [place] office at [phone] if you have any questions, problems or concerns.

## 2016-05-22 ENCOUNTER — Other Ambulatory Visit: Payer: Medicare Other | Admitting: *Deleted

## 2016-05-22 ENCOUNTER — Ambulatory Visit (INDEPENDENT_AMBULATORY_CARE_PROVIDER_SITE_OTHER): Payer: Medicare Other | Admitting: Internal Medicine

## 2016-05-22 ENCOUNTER — Encounter: Payer: Self-pay | Admitting: Internal Medicine

## 2016-05-22 VITALS — BP 132/76 | HR 74 | Ht 74.0 in | Wt 208.0 lb

## 2016-05-22 DIAGNOSIS — Q249 Congenital malformation of heart, unspecified: Secondary | ICD-10-CM

## 2016-05-22 DIAGNOSIS — I428 Other cardiomyopathies: Secondary | ICD-10-CM

## 2016-05-22 DIAGNOSIS — I4729 Other ventricular tachycardia: Secondary | ICD-10-CM

## 2016-05-22 DIAGNOSIS — Z95810 Presence of automatic (implantable) cardiac defibrillator: Secondary | ICD-10-CM

## 2016-05-22 DIAGNOSIS — Z7901 Long term (current) use of anticoagulants: Secondary | ICD-10-CM

## 2016-05-22 DIAGNOSIS — I472 Ventricular tachycardia: Secondary | ICD-10-CM

## 2016-05-22 DIAGNOSIS — I480 Paroxysmal atrial fibrillation: Secondary | ICD-10-CM | POA: Diagnosis not present

## 2016-05-22 DIAGNOSIS — Q208 Other congenital malformations of cardiac chambers and connections: Secondary | ICD-10-CM

## 2016-05-22 DIAGNOSIS — E7800 Pure hypercholesterolemia, unspecified: Secondary | ICD-10-CM

## 2016-05-22 LAB — CUP PACEART INCLINIC DEVICE CHECK
Brady Statistic RA Percent Paced: 94 %
Date Time Interrogation Session: 20180223151308
HighPow Impedance: 55.965
Implantable Lead Implant Date: 20101123
Implantable Lead Location: 753860
Implantable Pulse Generator Implant Date: 20141203
Lead Channel Impedance Value: 450 Ohm
Lead Channel Pacing Threshold Amplitude: 1 V
Lead Channel Pacing Threshold Pulse Width: 0.5 ms
Lead Channel Sensing Intrinsic Amplitude: 8.9 mV
MDC IDC LEAD IMPLANT DT: 20101123
MDC IDC LEAD LOCATION: 753859
MDC IDC MSMT LEADCHNL RA IMPEDANCE VALUE: 550 Ohm
MDC IDC MSMT LEADCHNL RA SENSING INTR AMPL: 2.7 mV
MDC IDC MSMT LEADCHNL RV PACING THRESHOLD AMPLITUDE: 0.75 V
MDC IDC MSMT LEADCHNL RV PACING THRESHOLD PULSEWIDTH: 0.5 ms
MDC IDC SET LEADCHNL RA PACING AMPLITUDE: 2.5 V
MDC IDC SET LEADCHNL RV PACING AMPLITUDE: 2.5 V
MDC IDC SET LEADCHNL RV PACING PULSEWIDTH: 0.5 ms
MDC IDC SET LEADCHNL RV SENSING SENSITIVITY: 0.5 mV
MDC IDC STAT BRADY RV PERCENT PACED: 16 %
Pulse Gen Serial Number: 7136426

## 2016-05-22 LAB — CBC
Hematocrit: 46.5 % (ref 37.5–51.0)
Hemoglobin: 15.8 g/dL (ref 13.0–17.7)
MCH: 29.6 pg (ref 26.6–33.0)
MCHC: 34 g/dL (ref 31.5–35.7)
MCV: 87 fL (ref 79–97)
Platelets: 175 10*3/uL (ref 150–379)
RBC: 5.33 x10E6/uL (ref 4.14–5.80)
RDW: 13.9 % (ref 12.3–15.4)
WBC: 4.9 10*3/uL (ref 3.4–10.8)

## 2016-05-22 LAB — HEPATIC FUNCTION PANEL
ALT: 23 IU/L (ref 0–44)
AST: 25 IU/L (ref 0–40)
Albumin: 4 g/dL (ref 3.5–4.8)
Alkaline Phosphatase: 66 IU/L (ref 39–117)
Bilirubin Total: 0.8 mg/dL (ref 0.0–1.2)
Bilirubin, Direct: 0.19 mg/dL (ref 0.00–0.40)
Total Protein: 6.6 g/dL (ref 6.0–8.5)

## 2016-05-22 LAB — BASIC METABOLIC PANEL
BUN/Creatinine Ratio: 17 (ref 10–24)
BUN: 18 mg/dL (ref 8–27)
CO2: 23 mmol/L (ref 18–29)
Calcium: 9.2 mg/dL (ref 8.6–10.2)
Chloride: 101 mmol/L (ref 96–106)
Creatinine, Ser: 1.09 mg/dL (ref 0.76–1.27)
GFR calc Af Amer: 79 mL/min/{1.73_m2} (ref >59)
GFR calc non Af Amer: 68 mL/min/{1.73_m2} (ref >59)
Glucose: 91 mg/dL (ref 65–99)
Potassium: 4.5 mmol/L (ref 3.5–5.2)
Sodium: 139 mmol/L (ref 134–144)

## 2016-05-22 LAB — TSH: TSH: 1.26 u[IU]/mL (ref 0.450–4.500)

## 2016-05-22 LAB — LIPID PANEL
Chol/HDL Ratio: 3.3 ratio units (ref 0.0–5.0)
Cholesterol, Total: 153 mg/dL (ref 100–199)
HDL: 46 mg/dL (ref >39)
LDL Calculated: 94 mg/dL (ref 0–99)
Triglycerides: 66 mg/dL (ref 0–149)
VLDL Cholesterol Cal: 13 mg/dL (ref 5–40)

## 2016-05-22 NOTE — Progress Notes (Signed)
PCP: Robert Coma, MD  Robert Ochoa is a 71 y.o. male who presents today for routine electrophysiology followup.  Since his last visit, the patient reports doing very well.  He is wearing a "life is good" shirt today and feels that it is indeed good at this time.   Today, he denies symptoms of palpitations, chest pain, shortness of breath,  lower extremity edema, dizziness, presyncope, syncope, or ICD shocks.   The patient is otherwise without complaint today.   Past Medical History:  Diagnosis Date  . Anxiety   . Arrhythmogenic right ventricular dysplasia (HCC)    Hx of VT s/p dual-chamber St. Jude ICD. Cardiac MRI 2010 showed moderately dilated RV with hypokinesis, LV EF 47%. EF 50-55% 2010 but down to 45% 05/2011. Cath 01/2009 without significant CAD.  [name] Atrial fibrillation or flutter    Paroxysmal. Started on Pradaxa 05/2011.  . Cardiac defibrillator in place   . LV dysfunction    per echo 10/2011; EF 35%  // Echo 2/18: EF 40-45, diff HK, Gr 1 DD, mild dilated aortic root, mod LAE, mod RVE, mild reduced RVSF, mod RAE  . Septic shock(785.52) 10-27-11   with associated respiratory/renal failure/PAF/bradycardia  . Ventricular tachycardia ([place])    ICD discharges 05/2011 for this & AF-RVR - started on amiodarone then.   Past Surgical History:  Procedure Laterality Date  . APPENDECTOMY    . CARDIAC DEFIBRILLATOR PLACEMENT  02/19/09    St,. Jude   . IMPLANTABLE CARDIOVERTER DEFIBRILLATOR (ICD) GENERATOR CHANGE N/A 03/01/2013   Procedure: ICD GENERATOR CHANGE;  Surgeon: Robert Sprang, MD;  Location: [facility] CATH LAB;  Service: Cardiovascular;  Laterality: N/A;  . IMPLANTABLE CARDIOVERTER DEFIBRILLATOR GENERATOR CHANGE  03/01/13   for premature battery depletetion.  Now has a SJM Ellipse DR device placed by Dr Robert Ochoa    Current Outpatient Prescriptions  Medication Sig Dispense Refill  . amiodarone (PACERONE) 200 MG tablet Take 0.5 tablets (100 mg total) by mouth daily. 45 tablet 3  .  Calcium-Magnesium-Zinc (CAL-MAG-ZINC PO) Take 2 tablets by mouth daily.     [name] ELIQUIS 5 MG TABS tablet TAKE 1 TABLET BY MOUTH TWICE DAILY 60 tablet 5  . lisinopril (PRINIVIL,ZESTRIL) 10 MG tablet TAKE 2 TABLETS BY MOUTH DAILY 180 tablet 3  . metoprolol tartrate (LOPRESSOR) 25 MG tablet TAKE 3 TABLETS BY MOUTH TWICE DAILY 180 tablet 5  . Omega-3 Fatty Acids (FISH OIL) 1000 MG CAPS Take 3 capsules by mouth daily.     . sildenafil (VIAGRA) 100 MG tablet Take 1 tablet (100 mg total) by mouth daily as needed for erectile dysfunction. 10 tablet 0  . vitamin C (ASCORBIC ACID) 500 MG tablet Take 1,000 mg by mouth daily.      No current facility-administered medications for this visit.     Physical Exam: Vitals:   05/22/16 0944  BP: 132/76  Pulse: 74  SpO2: 96%  Weight: 208 lb (94.3 kg)  Height: 6\' 2"  (1.88 m)    GEN- The patient is well appearing, alert and oriented x 3 today.   Head- normocephalic, atraumatic Eyes-  Sclera clear, conjunctiva pink Ears- hearing intact Oropharynx- clear Lungs- Clear to ausculation bilaterally, normal work of breathing Chest- ICD pocket is well healed Heart- Regular rate and rhythm, no murmurs, rubs or gallops, PMI not laterally displaced GI- soft, NT, ND, + BS Extremities- no clubbing, cyanosis, or edema  ICD interrogation- personally reviewed in detail today,  See PACEART report ekg today reveals AV pacing  Assessment and Plan:  1.  VT Normal ICD function today See Pace Art report No changes today Continue amiodarone 100mg  daily Labs today are pending V paced 16%,  VIP is on Histogram is flat.  He does not wish for adjustment of sensor today  2. AFib Well controlled No changes On eliquis  3. Chronic systolic dysfunction Continue lisinopril CT ordered by Robert Ochoa to follow aortic root enlargement  4. Erectile dysfunction Stable No change required today  Will followup with Robert Ochoa in 6 months  Follow-up with me in 12 months Robert Divers MD, [facility] 05/22/2016 10:21 AM

## 2016-05-22 NOTE — Patient Instructions (Addendum)
Medication Instructions:  Your physician recommends that you continue on your current medications as directed. Please refer to the Current Medication list given to you today.   Labwork: None ordered   Testing/Procedures: None ordered   Follow-Up: Your physician wants you to follow-up in: 12 months with Dr. Rayann Heman.  You will receive a reminder letter in the mail two months in advance. If you don't receive a letter, please call our office to schedule the follow-up appointment.  Remote monitoring is used to monitor your ICD from home. This monitoring reduces the number of office visits required to check your device to one time per year. It allows [place] to keep an eye on the functioning of your device to ensure it is working properly. You are scheduled for a device check from home on 08/24/16. You may send your transmission at any time that day. If you have a wireless device, the transmission will be sent automatically. After your physician reviews your transmission, you will receive a postcard with your next transmission date.    Any Other Special Instructions Will Be Listed Below (If Applicable).     If you need a refill on your cardiac medications before your next appointment, please call your pharmacy.

## 2016-05-27 ENCOUNTER — Other Ambulatory Visit: Payer: Self-pay | Admitting: Internal Medicine

## 2016-06-01 ENCOUNTER — Ambulatory Visit (HOSPITAL_COMMUNITY)
Admission: RE | Admit: 2016-06-01 | Discharge: 2016-06-01 | Disposition: A | Discharge status: Home / Self Care | Payer: Medicare Other | Source: Ambulatory Visit | Attending: Nurse Practitioner | Admitting: Nurse Practitioner

## 2016-06-01 DIAGNOSIS — R942 Abnormal results of pulmonary function studies: Secondary | ICD-10-CM | POA: Insufficient documentation

## 2016-06-01 DIAGNOSIS — Z79899 Other long term (current) drug therapy: Secondary | ICD-10-CM | POA: Diagnosis not present

## 2016-06-01 LAB — PULMONARY FUNCTION TEST
DL/VA % pred: 59 %
DL/VA: 2.88 ml/min/mmHg/L
DLCO cor % pred: 62 %
DLCO cor: 23.55 ml/min/mmHg
DLCO unc % pred: 62 %
DLCO unc: 23.55 ml/min/mmHg
FEF 25-75 Post: 3.67 L/sec
FEF 25-75 Pre: 3.15 L/sec
FEF2575-%Change-Post: 16 %
FEF2575-%Pred-Post: 131 %
FEF2575-%Pred-Pre: 113 %
FEV1-%Change-Post: 5 %
FEV1-%Pred-Post: 118 %
FEV1-%Pred-Pre: 112 %
FEV1-Post: 4.41 L
FEV1-Pre: 4.19 L
FEV1FVC-%Change-Post: 2 %
FEV1FVC-%Pred-Pre: 100 %
FEV6-%Change-Post: 3 %
FEV6-%Pred-Post: 121 %
FEV6-%Pred-Pre: 118 %
FEV6-Post: 5.84 L
FEV6-Pre: 5.66 L
FEV6FVC-%Change-Post: 0 %
FEV6FVC-%Pred-Post: 104 %
FEV6FVC-%Pred-Pre: 103 %
FVC-%Change-Post: 2 %
FVC-%Pred-Post: 116 %
FVC-%Pred-Pre: 113 %
FVC-Post: 5.9 L
FVC-Pre: 5.73 L
Post FEV1/FVC ratio: 75 %
Post FEV6/FVC ratio: 99 %
Pre FEV1/FVC ratio: 73 %
Pre FEV6/FVC Ratio: 99 %
RV % pred: 86 %
RV: 2.33 L
TLC % pred: 107 %
TLC: 8.45 L

## 2016-06-01 MED ORDER — ALBUTEROL SULFATE (2.5 MG/3ML) 0.083% IN NEBU
2.5000 mg | INHALATION_SOLUTION | Freq: Once | RESPIRATORY_TRACT | Status: AC
  Administered 2016-06-01: 2.5 mg via RESPIRATORY_TRACT

## 2016-06-09 ENCOUNTER — Other Ambulatory Visit: Payer: Self-pay | Admitting: *Deleted

## 2016-06-09 DIAGNOSIS — R942 Abnormal results of pulmonary function studies: Secondary | ICD-10-CM

## 2016-06-17 ENCOUNTER — Other Ambulatory Visit: Payer: Self-pay | Admitting: Internal Medicine

## 2016-07-28 ENCOUNTER — Ambulatory Visit (INDEPENDENT_AMBULATORY_CARE_PROVIDER_SITE_OTHER): Payer: Medicare Other | Admitting: Pulmonary Disease

## 2016-07-28 ENCOUNTER — Encounter: Payer: Self-pay | Admitting: Pulmonary Disease

## 2016-07-28 VITALS — BP 116/76 | HR 63 | Ht 74.0 in | Wt 210.6 lb

## 2016-07-28 DIAGNOSIS — R942 Abnormal results of pulmonary function studies: Secondary | ICD-10-CM | POA: Diagnosis not present

## 2016-07-28 NOTE — Assessment & Plan Note (Addendum)
Low diffusion capacity may be related to pulmonary circulation rather than amiodarone There is no interstitial infiltrates or evidence of interstitial lung disease or fluid overload. Although no evidence of pulmonary hypertension on echo, due to RV dysplasia the only way to clearly evaluate pulmonary hypertension would be a right heart cath. Since he is asymptomatic, I do not feel the need to pursue this at this time  Recheck diffusion capacity in 6 months and follow-up

## 2016-07-28 NOTE — Progress Notes (Signed)
Subjective:    Patient ID: Robert Ochoa, male    DOB: [birth year], 71 y.o.   MRN: [id number]  HPI  71 year old never smoker with a history of ARVD (Arrhythmogenic right ventricular dysplasia) s/p dual-chamber ICD Referred for evaluation of abnormal PFTs, specifically low diffusion capacity.   Admitted  10/2011 with sepsis likely due to RML pneumonia (CAP). He had bradycardia in setting of sepsis, then developed shock, ARDS  Bubble study was positive for right to left shunt but somewhat late indicating possible pulmonary origin EF 35%, dilated RV He recovered quite well and has done well over the last 5 years except for atrial fibrillation requiring amiodarone and anticoagulation. Dose has been decreased 100 mg daily and PFTs have been repeated every year. He is now referred for diffusion capacity of 62% noted on PFTs 05/2016  He denies dyspnea, cough or pedal edema. He does not have orthopnea or paroxysmal nocturnal dyspnea Serial echoes showed recovery of EF to normal but has dropped down in the last 2 years to 35% and now recovered again to 45%  Chest x-ray 04/2016 does not show any infiltrates or effusions CT chest 07/2012 done for ventral hernias shows normal lung parenchyma    Significant tests/ events reviewed  DLCO trend  05/2016 -23.5-62% 03/2015 28-75% 03/2014  26-68% 05/2012 21.2-82%   Echo 04/2016 shows dilated RV, EF 45% and RVSP 25    Past Medical History:  Diagnosis Date  . Anxiety   . Arrhythmogenic right ventricular dysplasia (HCC)    Hx of VT s/p dual-chamber St. Jude ICD. Cardiac MRI 2010 showed moderately dilated RV with hypokinesis, LV EF 47%. EF 50-55% 2010 but down to 45% 05/2011. Cath 01/2009 without significant CAD.  [name] Atrial fibrillation or flutter    Paroxysmal. Started on Pradaxa 05/2011.  . Cardiac defibrillator in place   . LV dysfunction    per echo 10/2011; EF 35%  // Echo 2/18: EF 40-45, diff HK, Gr 1 DD, mild dilated aortic root, mod LAE, mod RVE,  mild reduced RVSF, mod RAE  . Septic shock(785.52) 10-27-11   with associated respiratory/renal failure/PAF/bradycardia  . Ventricular tachycardia ([place])    ICD discharges 05/2011 for this & AF-RVR - started on amiodarone then.     Past Surgical History:  Procedure Laterality Date  . APPENDECTOMY    . CARDIAC DEFIBRILLATOR PLACEMENT  02/19/09    St,. Jude   . IMPLANTABLE CARDIOVERTER DEFIBRILLATOR (ICD) GENERATOR CHANGE N/A 03/01/2013   Procedure: ICD GENERATOR CHANGE;  Surgeon: [name], MD;  Location: [facility] CATH LAB;  Service: Cardiovascular;  Laterality: N/A;  . IMPLANTABLE CARDIOVERTER DEFIBRILLATOR GENERATOR CHANGE  03/01/13   for premature battery depletetion.  Now has a SJM Ellipse DR device placed by Dr Caryl Comes    No Known Allergies    Social History   Social History  . Marital status: Divorced    Spouse name: N/A  . Number of children: 1  . Years of education: N/A   Occupational History  .  [employer] Nurse, adult   Social History Main Topics  . Smoking status: Never Smoker  . Smokeless tobacco: Never Used  . Alcohol use Yes     Comment: social--3 beers per week  . Drug use: No  . Sexual activity: Yes    Birth control/ protection: Coitus interruptus   Other Topics Concern  . Not on file   Social History Narrative   Divorced w 1 child. Self employed.  Family History  Problem Relation Age of Onset  . Heart attack Father   . Allergies Mother        Review of Systems Constitutional: negative for anorexia, fevers and sweats  Eyes: negative for irritation, redness and visual disturbance  Ears, nose, mouth, throat, and face: negative for earaches, epistaxis, nasal congestion and sore throat  Respiratory: negative for cough, dyspnea on exertion, sputum and wheezing  Cardiovascular: negative for chest pain, dyspnea, lower extremity edema, orthopnea, palpitations and syncope  Gastrointestinal: negative for abdominal pain,  constipation, diarrhea, melena, nausea and vomiting  Genitourinary:negative for dysuria, frequency and hematuria  Hematologic/lymphatic: negative for bleeding, easy bruising and lymphadenopathy  Musculoskeletal:negative for arthralgias, muscle weakness and stiff joints  Neurological: negative for coordination problems, gait problems, headaches and weakness  Endocrine: negative for diabetic symptoms including polydipsia, polyuria and weight loss     Objective:   Physical Exam  Gen. Pleasant, well-nourished, in no distress, normal affect ENT - no lesions, no post nasal drip Neck: No JVD, no thyromegaly, no carotid bruits Lungs: no use of accessory muscles, no dullness to percussion, clear without rales or rhonchi  Cardiovascular: Rhythm regular, heart sounds  normal, no murmurs or gallops, no peripheral edema Abdomen: soft and non-tender, no hepatosplenomegaly, BS normal. Musculoskeletal: No deformities, no cyanosis or clubbing Neuro:  alert, non focal       Assessment & Plan:

## 2016-07-28 NOTE — Patient Instructions (Signed)
Low diffusion capacity may be related to pulmonary circulation rather than amiodarone  Recheck diffusion capacity in 6 months and follow-up

## 2016-07-28 NOTE — Progress Notes (Signed)
   Subjective:    Patient ID: Robert Ochoa, male    DOB: 1946/03/15, 71 y.o.   MRN: [id number]  HPI    Review of Systems  Constitutional: Negative for fever and unexpected weight change.  HENT: Negative for congestion, dental problem, ear pain, nosebleeds, postnasal drip, rhinorrhea, sinus pressure, sneezing, sore throat and trouble swallowing.   Eyes: Negative for redness and itching.  Respiratory: Negative for cough, chest tightness, shortness of breath and wheezing.   Cardiovascular: Negative for palpitations and leg swelling.  Gastrointestinal: Negative for nausea and vomiting.  Genitourinary: Negative for dysuria.  Musculoskeletal: Negative for joint swelling.  Skin: Negative for rash.  Neurological: Negative for headaches.  Hematological: Does not bruise/bleed easily.  Psychiatric/Behavioral: Negative for dysphoric mood. The patient is not nervous/anxious.        Objective:   Physical Exam        Assessment & Plan:

## 2016-08-04 NOTE — Progress Notes (Signed)
I agree with your plan to recheck in 6 months. I think if he starts having symptoms then we can arrange for right heart cath.   Thanks so much for seeing him.

## 2016-08-23 ENCOUNTER — Other Ambulatory Visit: Payer: Self-pay | Admitting: Internal Medicine

## 2016-08-25 ENCOUNTER — Ambulatory Visit (INDEPENDENT_AMBULATORY_CARE_PROVIDER_SITE_OTHER): Payer: Medicare Other | Admitting: *Deleted

## 2016-08-25 DIAGNOSIS — I428 Other cardiomyopathies: Secondary | ICD-10-CM

## 2016-08-27 NOTE — Progress Notes (Signed)
Remote ICD transmission.   

## 2016-08-28 LAB — CUP PACEART REMOTE DEVICE CHECK
Battery Remaining Longevity: 52 mo
Battery Remaining Percentage: 62 %
Battery Voltage: 2.92 V
Brady Statistic AP VP Percent: 13 %
Brady Statistic AP VS Percent: 85 %
Brady Statistic AS VP Percent: 1 %
Brady Statistic AS VS Percent: 1.5 %
Date Time Interrogation Session: 20180529060016
HIGH POWER IMPEDANCE MEASURED VALUE: 58 Ohm
HighPow Impedance: 58 Ohm
Implantable Lead Implant Date: 20101123
Implantable Lead Location: 753860
Implantable Pulse Generator Implant Date: 20141203
Lead Channel Impedance Value: 440 Ohm
Lead Channel Pacing Threshold Amplitude: 1 V
Lead Channel Pacing Threshold Pulse Width: 0.5 ms
Lead Channel Sensing Intrinsic Amplitude: 8.9 mV
Lead Channel Setting Pacing Amplitude: 2.5 V
Lead Channel Setting Sensing Sensitivity: 0.5 mV
MDC IDC LEAD IMPLANT DT: 20101123
MDC IDC LEAD LOCATION: 753859
MDC IDC MSMT LEADCHNL RA IMPEDANCE VALUE: 450 Ohm
MDC IDC MSMT LEADCHNL RA SENSING INTR AMPL: 2.5 mV
MDC IDC MSMT LEADCHNL RV PACING THRESHOLD AMPLITUDE: 0.75 V
MDC IDC MSMT LEADCHNL RV PACING THRESHOLD PULSEWIDTH: 0.5 ms
MDC IDC SET LEADCHNL RV PACING AMPLITUDE: 2.5 V
MDC IDC SET LEADCHNL RV PACING PULSEWIDTH: 0.5 ms
MDC IDC STAT BRADY RA PERCENT PACED: 96 %
MDC IDC STAT BRADY RV PERCENT PACED: 13 %
Pulse Gen Serial Number: 7136426

## 2016-09-04 ENCOUNTER — Encounter: Payer: Self-pay | Admitting: Cardiology

## 2016-10-05 DIAGNOSIS — L570 Actinic keratosis: Secondary | ICD-10-CM | POA: Diagnosis not present

## 2016-10-05 DIAGNOSIS — L821 Other seborrheic keratosis: Secondary | ICD-10-CM | POA: Diagnosis not present

## 2016-10-05 DIAGNOSIS — D692 Other nonthrombocytopenic purpura: Secondary | ICD-10-CM | POA: Diagnosis not present

## 2016-10-05 DIAGNOSIS — L812 Freckles: Secondary | ICD-10-CM | POA: Diagnosis not present

## 2016-10-05 DIAGNOSIS — L84 Corns and callosities: Secondary | ICD-10-CM | POA: Diagnosis not present

## 2016-10-05 DIAGNOSIS — Z85828 Personal history of other malignant neoplasm of skin: Secondary | ICD-10-CM | POA: Diagnosis not present

## 2016-10-05 DIAGNOSIS — D225 Melanocytic nevi of trunk: Secondary | ICD-10-CM | POA: Diagnosis not present

## 2016-10-26 ENCOUNTER — Other Ambulatory Visit: Payer: Self-pay | Admitting: Internal Medicine

## 2016-10-26 NOTE — Telephone Encounter (Signed)
Age 71 years 07/28/2016 Wt  95.5kg Saw Dr Rayann Heman 05/22/2016  05/22/2016  SrCr 1.09 05/22/2016 Hgb 15.8 HCT 46.5  Refill done for Eliquis 5mg  every 12 hours as requested

## 2016-11-03 ENCOUNTER — Ambulatory Visit (INDEPENDENT_AMBULATORY_CARE_PROVIDER_SITE_OTHER): Payer: Medicare Other | Admitting: Nurse Practitioner

## 2016-11-03 ENCOUNTER — Encounter: Payer: Self-pay | Admitting: Nurse Practitioner

## 2016-11-03 VITALS — BP 132/90 | HR 60 | Ht 74.0 in | Wt 209.4 lb

## 2016-11-03 DIAGNOSIS — I428 Other cardiomyopathies: Secondary | ICD-10-CM

## 2016-11-03 DIAGNOSIS — I4729 Other ventricular tachycardia: Secondary | ICD-10-CM

## 2016-11-03 DIAGNOSIS — Z95810 Presence of automatic (implantable) cardiac defibrillator: Secondary | ICD-10-CM

## 2016-11-03 DIAGNOSIS — I472 Ventricular tachycardia: Secondary | ICD-10-CM | POA: Diagnosis not present

## 2016-11-03 DIAGNOSIS — Z79899 Other long term (current) drug therapy: Secondary | ICD-10-CM

## 2016-11-03 LAB — CBC
Hematocrit: 49.5 % (ref 37.5–51.0)
Hemoglobin: 16.3 g/dL (ref 13.0–17.7)
MCH: 29.1 pg (ref 26.6–33.0)
MCHC: 32.9 g/dL (ref 31.5–35.7)
MCV: 88 fL (ref 79–97)
Platelets: 198 10*3/uL (ref 150–379)
RBC: 5.6 x10E6/uL (ref 4.14–5.80)
RDW: 13.6 % (ref 12.3–15.4)
WBC: 6.5 10*3/uL (ref 3.4–10.8)

## 2016-11-03 LAB — LIPID PANEL
Chol/HDL Ratio: 3.6 ratio (ref 0.0–5.0)
Cholesterol, Total: 163 mg/dL (ref 100–199)
HDL: 45 mg/dL (ref >39)
LDL Calculated: 100 mg/dL — ABNORMAL HIGH (ref 0–99)
Triglycerides: 89 mg/dL (ref 0–149)
VLDL Cholesterol Cal: 18 mg/dL (ref 5–40)

## 2016-11-03 LAB — BASIC METABOLIC PANEL
BUN/Creatinine Ratio: 20 (ref 10–24)
BUN: 23 mg/dL (ref 8–27)
CO2: 19 mmol/L — ABNORMAL LOW (ref 20–29)
Calcium: 9.7 mg/dL (ref 8.6–10.2)
Chloride: 105 mmol/L (ref 96–106)
Creatinine, Ser: 1.17 mg/dL (ref 0.76–1.27)
GFR calc Af Amer: 72 mL/min/{1.73_m2} (ref >59)
GFR calc non Af Amer: 62 mL/min/{1.73_m2} (ref >59)
Glucose: 108 mg/dL — ABNORMAL HIGH (ref 65–99)
Potassium: 5 mmol/L (ref 3.5–5.2)
Sodium: 138 mmol/L (ref 134–144)

## 2016-11-03 LAB — HEPATIC FUNCTION PANEL
ALT: 23 IU/L (ref 0–44)
AST: 29 IU/L (ref 0–40)
Albumin: 4.5 g/dL (ref 3.5–4.8)
Alkaline Phosphatase: 69 IU/L (ref 39–117)
Bilirubin Total: 0.8 mg/dL (ref 0.0–1.2)
Bilirubin, Direct: 0.18 mg/dL (ref 0.00–0.40)
Total Protein: 7.4 g/dL (ref 6.0–8.5)

## 2016-11-03 LAB — TSH: TSH: 0.995 u[IU]/mL (ref 0.450–4.500)

## 2016-11-03 NOTE — Patient Instructions (Addendum)
We will be checking the following labs today - BMET, CBC, HPF, Lipids and TSH   Medication Instructions:    Continue with your current medicines.     Testing/Procedures To Be Arranged:  CT scan later this month  Follow-Up:   See Dr. Rayann Heman in 6 months and I will see you in one year.     Other Special Instructions:   Keep up the good work.     If you need a refill on your cardiac medications before your next appointment, please call your pharmacy.   Call the [place] office at [phone] if you have any questions, problems or concerns.

## 2016-11-03 NOTE — Progress Notes (Signed)
CARDIOLOGY OFFICE NOTE  Date:  11/03/2016    Robert Ochoa Date of Birth: 29-Mar-1946 Medical Record #[id number]  PCP:  Jonathon Jordan, MD  Cardiologist:  [facility]  Chief Complaint  Patient presents with  . Congestive Heart Failure    Seen for Dr. Rayann Heman    History of Present Illness: Robert Ochoa is a 71 y.o. male who presents today for a follow up visit. Seen for Dr. Rayann Heman.   He has multiple medical issues which include past pneumonia with associated sepsis, renal failure and respiratory failure back in 2013. Has atrial fib - maintained in NSR on very low dose amiodarone. Has an ICD/pacemaker in place with a set lower rate of 60. Last EF was 35 to 40% back in October of 2013 and increased to 45 to 50% per echo from October of 2014. Normal EF by echo from 2016. He is managed medically. His other issues include right ventricular dysplasia, ICD implant, PAF and paroxysmal VT. He remains on chronic amiodarone and chronic Eliquis. Last PFT was January of 2017. No CAD per cath back in 2010.   I saw him back in August of 2017 - he was actively losing weight. He was doing great and happy with how he was feeling. We got his echo updated due to aortic root enlargement - this has showed that his EF had dropped back - he is not able to have an MRI due his device implant. He is on a good CHF regimen. His study was reviewed with Dr. Rayann Heman who wanted to keep him on his current regimen and follow him along. Patient was agreeable to continuing with his current management with plans to repeat echo 6 months later - this was done back in February - EF 40 to 45% - still doing well.  Last seen by Dr. Rayann Heman back in February as well - was doing well.   Comes in today. Here alone. Seems pretty laid back today. No complaints. Remains active. Exercising regularly. No chest pain. Breathing is good. Not dizzy. No bleeding/bruising issues. No falls. He is happy with how he is doing.   Past  Medical History:  Diagnosis Date  . Anxiety   . Arrhythmogenic right ventricular dysplasia (HCC)    Hx of VT s/p dual-chamber St. Jude ICD. Cardiac MRI 2010 showed moderately dilated RV with hypokinesis, LV EF 47%. EF 50-55% 2010 but down to 45% 05/2011. Cath 01/2009 without significant CAD.  [name] Atrial fibrillation or flutter    Paroxysmal. Started on Pradaxa 05/2011.  . Cardiac defibrillator in place   . LV dysfunction    per echo 10/2011; EF 35%  // Echo 2/18: EF 40-45, diff HK, Gr 1 DD, mild dilated aortic root, mod LAE, mod RVE, mild reduced RVSF, mod RAE  . Septic shock(785.52) 10-27-11   with associated respiratory/renal failure/PAF/bradycardia  . Ventricular tachycardia ([place])    ICD discharges 05/2011 for this & AF-RVR - started on amiodarone then.    Past Surgical History:  Procedure Laterality Date  . APPENDECTOMY    . CARDIAC DEFIBRILLATOR PLACEMENT  02/19/09    St,. Jude   . IMPLANTABLE CARDIOVERTER DEFIBRILLATOR (ICD) GENERATOR CHANGE N/A 03/01/2013   Procedure: ICD GENERATOR CHANGE;  Surgeon: [name], MD;  Location: [facility] CATH LAB;  Service: Cardiovascular;  Laterality: N/A;  . IMPLANTABLE CARDIOVERTER DEFIBRILLATOR GENERATOR CHANGE  03/01/13   for premature battery depletetion.  Now has a SJM Ellipse DR device placed by Dr Caryl Comes  Medications: Current Meds  Medication Sig  . amiodarone (PACERONE) 200 MG tablet TAKE 1/2 TABLET(100 MG) BY MOUTH DAILY  . Calcium-Magnesium-Zinc (CAL-MAG-ZINC PO) Take 2 tablets by mouth daily.   [name] ELIQUIS 5 MG TABS tablet TAKE 1 TABLET BY MOUTH TWICE DAILY  . lisinopril (PRINIVIL,ZESTRIL) 10 MG tablet TAKE 2 TABLETS BY MOUTH EVERY DAY  . metoprolol tartrate (LOPRESSOR) 25 MG tablet TAKE 3 TABLETS BY MOUTH TWICE DAILY  . Omega-3 Fatty Acids (FISH OIL) 1000 MG CAPS Take 3 capsules by mouth daily.   . sildenafil (VIAGRA) 100 MG tablet Take 1 tablet (100 mg total) by mouth daily as needed for erectile dysfunction.  . vitamin C (ASCORBIC ACID)  500 MG tablet Take 1,000 mg by mouth daily.      Allergies: No Known Allergies  Social History: The patient  reports that he has never smoked. He has never used smokeless tobacco. He reports that he drinks alcohol. He reports that he does not use drugs.   Family History: The patient's family history includes Allergies in his mother; Heart attack in his father.   Review of Systems: Please see the history of present illness.   Otherwise, the review of systems is positive for none.   All other systems are reviewed and negative.   Physical Exam: VS:  BP 132/90 (BP Location: Left Arm, Patient Position: Sitting, Cuff Size: Normal)   Pulse 60   Ht 6\' 2"  (1.88 m)   Wt 209 lb 6.4 oz (95 kg)   BMI 26.89 kg/m  .  BMI Body mass index is 26.89 kg/m.  Wt Readings from Last 3 Encounters:  11/03/16 209 lb 6.4 oz (95 kg)  07/28/16 210 lb 9.6 oz (95.5 kg)  05/22/16 208 lb (94.3 kg)   BP recheck by me is 110/80  General: Pleasant. Well developed, well nourished and in no acute distress.   HEENT: Normal.  Neck: Supple, no JVD, carotid bruits, or masses noted.  Cardiac: Regular rate and rhythm. No murmurs, rubs, or gallops. No edema. ICD in the left upper chest. Respiratory:  Lungs are clear to auscultation bilaterally with normal work of breathing.  GI: Soft and nontender.  MS: No deformity or atrophy. Gait and ROM intact.  Skin: Warm and dry. Color is normal.  Neuro:  Strength and sensation are intact and no gross focal deficits noted.  Psych: Alert, appropriate and with normal affect.   LABORATORY DATA:  EKG:  EKG is ordered today. This demonstrates A pacing with anterolateral ischemia.  Lab Results  Component Value Date   WBC 4.9 05/22/2016   HGB 15.8 05/22/2016   HCT 46.5 05/22/2016   PLT 175 05/22/2016   GLUCOSE 91 05/22/2016   CHOL 153 05/22/2016   TRIG 66 05/22/2016   HDL 46 05/22/2016   LDLCALC 94 05/22/2016   ALT 23 05/22/2016   AST 25 05/22/2016   NA 139 05/22/2016     K 4.5 05/22/2016   CL 101 05/22/2016   CREATININE 1.09 05/22/2016   BUN 18 05/22/2016   CO2 23 05/22/2016   TSH 1.260 05/22/2016   INR 1.18 10/30/2011     BNP (last 3 results) No results for input(s): BNP in the last 8760 hours.  ProBNP (last 3 results) No results for input(s): PROBNP in the last 8760 hours.   Other Studies Reviewed Today:  Echo Study Conclusions from 04/2016  - Left ventricle: The cavity size was normal. Wall thickness was normal. Systolic function was mildly to moderately reduced.  The estimated ejection fraction was in the range of 40% to 45%. Diffuse hypokinesis. Doppler parameters are consistent with abnormal left ventricular relaxation (grade 1 diastolic dysfunction). - Aortic root: The aortic root was mildly dilated. - Left atrium: The atrium was moderately dilated. - Right ventricle: The cavity size was moderately dilated. Systolic function was mildly reduced. - Right atrium: The atrium was moderately dilated.  Impressions:  - Mild to moderate global reduction in LV systolic function; grade 1 diastolic dysfunction; moderate biatrial enlargement; moderate RVE with mildly reduced function.   Echo Study Conclusions from 09/2015  - Left ventricle: The cavity size was mildly dilated. Wall thickness was normal. Systolic function was moderately to severely reduced. The estimated ejection fraction was in the range of 30% to 35%. Doppler parameters are consistent with abnormal left ventricular relaxation (grade 1 diastolic dysfunction). - Right ventricle: The cavity size was mildly dilated. Systolic function was moderately to severely reduced. - Right atrium: The atrium was mildly dilated.   Echo Study Conclusions from 09/2014  - Left ventricle: The cavity size was normal. Wall thickness was  increased in a pattern of mild LVH. Systolic function was normal.  The estimated ejection fraction was in the range  of 50% to 55%.  Doppler parameters are consistent with abnormal left ventricular  relaxation (grade 1 diastolic dysfunction). The E/e&' ratio is <8,  suggesting normal LV filling pressure. - Aorta: Aortic root dimension: 42 mm (ED). - Aortic root: The aortic root is dilated. - Left atrium: The atrium was normal in size. - Right atrium: The atrium was mildly dilated. - Tricuspid valve: There was mild regurgitation. - Pulmonary arteries: PA peak pressure: 33 mm Hg (S). - Inferior vena cava: The vessel was normal in size. The  respirophasic diameter changes were in the normal range (>= 50%),  consistent with normal central venous pressure.  Impressions:  - LVEF 50-55%, incoordinate septal motion, AICD wires in place,  diastolic dysfunction, normal LV filling pressure, mildly dilated  aortic root to 4.2 cm, mildly dilated RA, mild TR, top normal  RVSP at 33 mmHg.   Echo Study Conclusions from 2014  - Left ventricle: The cavity size was normal. Wall thickness was normal. Systolic function was mildly reduced. The estimated ejection fraction was in the range of 45% to 50%. Diffuse hypokinesis. Doppler parameters are consistent with abnormal left ventricular relaxation (grade 1 diastolic dysfunction). - Left atrium: The atrium was mildly dilated. - Right ventricle: The cavity size was moderately to severely dilated. Systolic function was moderately reduced. - Right atrium: The atrium was moderately dilated. - Atrial septum: There was an atrial septal aneurysm. - Pulmonary arteries: PA peak pressure: 57mm Hg (S).     Assessment/Plan:  1. Systolic heart failure/nonischemic CM - EF back to 40 to 45% from echo earlier this year. He is totally asymptomatic.   He feels great. No changes with his current regimen.   2. ARVD - has his ICD in place.Followed by Dr. Rayann Heman - last seen in February with device check.    3. PAF - on amiodarone - needs surveillance  labs today.   4. Chronic anticoagulation - on Eliquis - no problems noted. Lab today.   5. Enlarged aortic root - he is for CT angio later this month to follow up.   6. Low diffusion capacity on PFTs - followed by Dr. Elsworth Soho - no evidence of pulmonary HTN on echo - only way to clearly evaluate pulmonary hypertension would be a right heart  cath. Since he is asymptomatic Dr. Elsworth Soho does not feel this needs to be pursued at this time. He remains asymptomatic.    Current medicines are reviewed with the patient today.  The patient does not have concerns regarding medicines other than what has been noted above.  The following changes have been made:  See above.  Labs/ tests ordered today include:    Orders Placed This Encounter  Procedures  . Basic metabolic panel  . CBC  . Hepatic function panel  . Lipid panel  . TSH  . EKG 12-Lead     Disposition:   FU with Dr. Rayann Heman as planned. I will see back in one year.  Patient is agreeable to this plan and will call if any problems develop in the interim.   SignedTruitt Merle, NP  11/03/2016 11:23 AM  [facility] [address] [place], [place]  [address] Phone: [phone] Fax: [phone]

## 2016-11-09 DIAGNOSIS — R972 Elevated prostate specific antigen [PSA]: Secondary | ICD-10-CM | POA: Diagnosis not present

## 2016-11-16 ENCOUNTER — Other Ambulatory Visit: Payer: Medicare Other

## 2016-11-16 DIAGNOSIS — R972 Elevated prostate specific antigen [PSA]: Secondary | ICD-10-CM | POA: Diagnosis not present

## 2016-11-16 DIAGNOSIS — N400 Enlarged prostate without lower urinary tract symptoms: Secondary | ICD-10-CM | POA: Diagnosis not present

## 2016-11-16 DIAGNOSIS — N5201 Erectile dysfunction due to arterial insufficiency: Secondary | ICD-10-CM | POA: Diagnosis not present

## 2016-11-23 ENCOUNTER — Ambulatory Visit (INDEPENDENT_AMBULATORY_CARE_PROVIDER_SITE_OTHER)
Admission: RE | Admit: 2016-11-23 | Discharge: 2016-11-23 | Disposition: A | Discharge status: Home / Self Care | Payer: Medicare Other | Source: Ambulatory Visit | Attending: Nurse Practitioner | Admitting: Nurse Practitioner

## 2016-11-23 DIAGNOSIS — E7800 Pure hypercholesterolemia, unspecified: Secondary | ICD-10-CM

## 2016-11-23 DIAGNOSIS — I480 Paroxysmal atrial fibrillation: Secondary | ICD-10-CM | POA: Diagnosis not present

## 2016-11-23 DIAGNOSIS — Z95810 Presence of automatic (implantable) cardiac defibrillator: Secondary | ICD-10-CM

## 2016-11-23 DIAGNOSIS — Z7901 Long term (current) use of anticoagulants: Secondary | ICD-10-CM

## 2016-11-23 DIAGNOSIS — I428 Other cardiomyopathies: Secondary | ICD-10-CM

## 2016-11-23 DIAGNOSIS — Q249 Congenital malformation of heart, unspecified: Secondary | ICD-10-CM

## 2016-11-23 DIAGNOSIS — I712 Thoracic aortic aneurysm, without rupture: Secondary | ICD-10-CM | POA: Diagnosis not present

## 2016-11-23 DIAGNOSIS — Q208 Other congenital malformations of cardiac chambers and connections: Secondary | ICD-10-CM

## 2016-11-23 MED ORDER — IOPAMIDOL (ISOVUE-370) INJECTION 76%
100.0000 mL | Freq: Once | INTRAVENOUS | Status: AC | PRN
  Administered 2016-11-23: 100 mL via INTRAVENOUS

## 2016-11-24 ENCOUNTER — Encounter: Payer: Medicare Other | Admitting: *Deleted

## 2016-11-27 ENCOUNTER — Telehealth: Payer: Self-pay | Admitting: Internal Medicine

## 2016-11-27 NOTE — Telephone Encounter (Signed)
New message    Patient calling the office for samples of medication:   1.  What medication and dosage are you requesting samples for? Eliquis 5 mg  2.  Are you currently out of this medication? Will be out Monday. He said he is in the donut hole.

## 2016-11-27 NOTE — Telephone Encounter (Signed)
Spoke with patient and made him aware that I would place some samples at the front desk. I made him aware that we are unable to provide samples ongoing for the remainder of the year but we can help him some if we have samples available. He appreciates any assistance that we can provide him with and stated that he will pick the refill up at his pharmacy after he uses the samples. I mentioned patient assistance and he states that his income is over the limit.

## 2016-12-14 ENCOUNTER — Ambulatory Visit (INDEPENDENT_AMBULATORY_CARE_PROVIDER_SITE_OTHER): Payer: Medicare Other | Admitting: *Deleted

## 2016-12-14 DIAGNOSIS — I428 Other cardiomyopathies: Secondary | ICD-10-CM

## 2016-12-14 NOTE — Progress Notes (Signed)
Remote ICD transmission.   

## 2016-12-16 ENCOUNTER — Encounter: Payer: Self-pay | Admitting: Cardiology

## 2016-12-16 LAB — CUP PACEART REMOTE DEVICE CHECK
Date Time Interrogation Session: 20180919111829
Implantable Lead Implant Date: 20101123
Implantable Lead Location: 753859
Implantable Lead Location: 753860
Implantable Lead Model: 7121
Implantable Pulse Generator Implant Date: 20141203
Lead Channel Setting Pacing Amplitude: 2.5 V
Lead Channel Setting Pacing Pulse Width: 0.5 ms
Lead Channel Setting Sensing Sensitivity: 0.5 mV
MDC IDC LEAD IMPLANT DT: 20101123
MDC IDC PG SERIAL: 7136426
MDC IDC SET LEADCHNL RV PACING AMPLITUDE: 2.5 V

## 2016-12-21 ENCOUNTER — Telehealth: Payer: Self-pay | Admitting: *Deleted

## 2016-12-21 NOTE — Telephone Encounter (Signed)
Informed pt that his transmission was received.  

## 2016-12-21 NOTE — Telephone Encounter (Signed)
New message ° ° ° ° ° °1. Has your device fired? no °2. Is you device beeping? no ° °3. Are you experiencing draining or swelling at device site? no ° °4. Are you calling to see if we received your device transmission?yes °5. Have you passed out? no °

## 2017-01-12 ENCOUNTER — Telehealth: Payer: Self-pay | Admitting: Internal Medicine

## 2017-01-12 NOTE — Telephone Encounter (Signed)
Spoke with patient and made him aware that we do not have any sample today. He will call back later in the week to see if we get any in.

## 2017-01-12 NOTE — Telephone Encounter (Signed)
New message    Pt is calling asking about samples.   Patient calling the office for samples of medication:   1.  What medication and dosage are you requesting samples for? Eliquis 5 mg  2.  Are you currently out of this medication? Pt will be out tomorrow.

## 2017-01-15 ENCOUNTER — Telehealth: Payer: Self-pay | Admitting: Internal Medicine

## 2017-01-15 DIAGNOSIS — Z23 Encounter for immunization: Secondary | ICD-10-CM | POA: Diagnosis not present

## 2017-01-15 NOTE — Telephone Encounter (Signed)
Spoke with patient and he is aware that I will place two weeks of samples at the front desk. Patient appreciative.

## 2017-01-15 NOTE — Telephone Encounter (Signed)
New message    Patient calling the office for samples of medication:   1.  What medication and dosage are you requesting samples for? Eliquis 5 mg  2.  Are you currently out of this medication? Pt states that he was told to call back today to see if we have samples.

## 2017-02-22 ENCOUNTER — Other Ambulatory Visit: Payer: Self-pay | Admitting: Internal Medicine

## 2017-03-15 ENCOUNTER — Ambulatory Visit (INDEPENDENT_AMBULATORY_CARE_PROVIDER_SITE_OTHER): Payer: Medicare Other | Admitting: *Deleted

## 2017-03-15 DIAGNOSIS — I428 Other cardiomyopathies: Secondary | ICD-10-CM

## 2017-03-15 NOTE — Progress Notes (Signed)
Remote ICD transmission.   

## 2017-03-17 ENCOUNTER — Encounter: Payer: Self-pay | Admitting: Cardiology

## 2017-03-17 LAB — CUP PACEART REMOTE DEVICE CHECK
Date Time Interrogation Session: 20181219152333
Implantable Lead Implant Date: 20101123
Implantable Lead Location: 753860
MDC IDC LEAD IMPLANT DT: 20101123
MDC IDC LEAD LOCATION: 753859
MDC IDC PG IMPLANT DT: 20141203
Pulse Gen Serial Number: 7136426

## 2017-04-16 DIAGNOSIS — L821 Other seborrheic keratosis: Secondary | ICD-10-CM | POA: Diagnosis not present

## 2017-04-16 DIAGNOSIS — L570 Actinic keratosis: Secondary | ICD-10-CM | POA: Diagnosis not present

## 2017-04-16 DIAGNOSIS — D0471 Carcinoma in situ of skin of right lower limb, including hip: Secondary | ICD-10-CM | POA: Diagnosis not present

## 2017-04-16 DIAGNOSIS — D485 Neoplasm of uncertain behavior of skin: Secondary | ICD-10-CM | POA: Diagnosis not present

## 2017-04-16 DIAGNOSIS — D044 Carcinoma in situ of skin of scalp and neck: Secondary | ICD-10-CM | POA: Diagnosis not present

## 2017-04-16 DIAGNOSIS — D1801 Hemangioma of skin and subcutaneous tissue: Secondary | ICD-10-CM | POA: Diagnosis not present

## 2017-04-16 DIAGNOSIS — L812 Freckles: Secondary | ICD-10-CM | POA: Diagnosis not present

## 2017-04-16 DIAGNOSIS — Z85828 Personal history of other malignant neoplasm of skin: Secondary | ICD-10-CM | POA: Diagnosis not present

## 2017-05-04 ENCOUNTER — Other Ambulatory Visit: Payer: Self-pay | Admitting: Internal Medicine

## 2017-05-10 ENCOUNTER — Encounter: Payer: Medicare Other | Admitting: Internal Medicine

## 2017-05-10 DIAGNOSIS — R509 Fever, unspecified: Secondary | ICD-10-CM | POA: Diagnosis not present

## 2017-05-10 DIAGNOSIS — J329 Chronic sinusitis, unspecified: Secondary | ICD-10-CM | POA: Diagnosis not present

## 2017-05-12 ENCOUNTER — Encounter: Payer: Medicare Other | Admitting: Internal Medicine

## 2017-05-17 ENCOUNTER — Ambulatory Visit (INDEPENDENT_AMBULATORY_CARE_PROVIDER_SITE_OTHER): Payer: Medicare Other | Admitting: Internal Medicine

## 2017-05-17 ENCOUNTER — Encounter: Payer: Self-pay | Admitting: Internal Medicine

## 2017-05-17 VITALS — BP 130/62 | HR 61 | Ht 74.0 in | Wt 216.0 lb

## 2017-05-17 DIAGNOSIS — I472 Ventricular tachycardia: Secondary | ICD-10-CM

## 2017-05-17 DIAGNOSIS — Z95810 Presence of automatic (implantable) cardiac defibrillator: Secondary | ICD-10-CM

## 2017-05-17 DIAGNOSIS — I480 Paroxysmal atrial fibrillation: Secondary | ICD-10-CM

## 2017-05-17 DIAGNOSIS — E785 Hyperlipidemia, unspecified: Secondary | ICD-10-CM | POA: Diagnosis not present

## 2017-05-17 DIAGNOSIS — I428 Other cardiomyopathies: Secondary | ICD-10-CM | POA: Diagnosis not present

## 2017-05-17 DIAGNOSIS — I4729 Other ventricular tachycardia: Secondary | ICD-10-CM

## 2017-05-17 LAB — CUP PACEART INCLINIC DEVICE CHECK
Battery Remaining Longevity: 46 mo
Date Time Interrogation Session: 20190218145407
HighPow Impedance: 53.625
Implantable Lead Location: 753859
Implantable Pulse Generator Implant Date: 20141203
Lead Channel Pacing Threshold Amplitude: 0.75 V
Lead Channel Pacing Threshold Amplitude: 1 V
Lead Channel Setting Pacing Amplitude: 2.5 V
Lead Channel Setting Pacing Pulse Width: 0.5 ms
Lead Channel Setting Sensing Sensitivity: 0.5 mV
MDC IDC LEAD IMPLANT DT: 20101123
MDC IDC LEAD IMPLANT DT: 20101123
MDC IDC LEAD LOCATION: 753860
MDC IDC MSMT LEADCHNL RA IMPEDANCE VALUE: 450 Ohm
MDC IDC MSMT LEADCHNL RA PACING THRESHOLD PULSEWIDTH: 0.5 ms
MDC IDC MSMT LEADCHNL RA SENSING INTR AMPL: 2.3 mV
MDC IDC MSMT LEADCHNL RV IMPEDANCE VALUE: 437.5 Ohm
MDC IDC MSMT LEADCHNL RV PACING THRESHOLD PULSEWIDTH: 0.5 ms
MDC IDC MSMT LEADCHNL RV SENSING INTR AMPL: 8.4 mV
MDC IDC SET LEADCHNL RA PACING AMPLITUDE: 2.5 V
MDC IDC STAT BRADY RA PERCENT PACED: 96 %
MDC IDC STAT BRADY RV PERCENT PACED: 15 %
Pulse Gen Serial Number: 7136426

## 2017-05-17 LAB — COMPREHENSIVE METABOLIC PANEL
A/G RATIO: 1.4 (ref 1.2–2.2)
ALK PHOS: 73 IU/L (ref 39–117)
ALT: 30 IU/L (ref 0–44)
AST: 27 IU/L (ref 0–40)
Albumin: 4 g/dL (ref 3.5–4.8)
BILIRUBIN TOTAL: 0.7 mg/dL (ref 0.0–1.2)
BUN / CREAT RATIO: 17 (ref 10–24)
BUN: 18 mg/dL (ref 8–27)
CHLORIDE: 100 mmol/L (ref 96–106)
CO2: 19 mmol/L — ABNORMAL LOW (ref 20–29)
Calcium: 9.1 mg/dL (ref 8.6–10.2)
Creatinine, Ser: 1.04 mg/dL (ref 0.76–1.27)
GFR calc Af Amer: 83 mL/min/{1.73_m2} (ref >59)
GFR calc non Af Amer: 71 mL/min/{1.73_m2} (ref >59)
GLUCOSE: 101 mg/dL — AB (ref 65–99)
Globulin, Total: 2.9 g/dL (ref 1.5–4.5)
POTASSIUM: 4.5 mmol/L (ref 3.5–5.2)
Sodium: 136 mmol/L (ref 134–144)
Total Protein: 6.9 g/dL (ref 6.0–8.5)

## 2017-05-17 LAB — CBC WITH DIFFERENTIAL/PLATELET
BASOS ABS: 0 10*3/uL (ref 0.0–0.2)
Basos: 1 %
EOS (ABSOLUTE): 0.2 10*3/uL (ref 0.0–0.4)
Eos: 4 %
Hematocrit: 45.9 % (ref 37.5–51.0)
Hemoglobin: 15.6 g/dL (ref 13.0–17.7)
Immature Grans (Abs): 0.1 10*3/uL (ref 0.0–0.1)
Immature Granulocytes: 2 %
LYMPHS ABS: 1.4 10*3/uL (ref 0.7–3.1)
Lymphs: 20 %
MCH: 29.2 pg (ref 26.6–33.0)
MCHC: 34 g/dL (ref 31.5–35.7)
MCV: 86 fL (ref 79–97)
Monocytes Absolute: 1 10*3/uL — ABNORMAL HIGH (ref 0.1–0.9)
Monocytes: 14 %
NEUTROS ABS: 4.1 10*3/uL (ref 1.4–7.0)
Neutrophils: 59 %
PLATELETS: 300 10*3/uL (ref 150–379)
RBC: 5.35 x10E6/uL (ref 4.14–5.80)
RDW: 13.5 % (ref 12.3–15.4)
WBC: 6.8 10*3/uL (ref 3.4–10.8)

## 2017-05-17 LAB — LIPID PANEL
CHOL/HDL RATIO: 4.8 ratio (ref 0.0–5.0)
Cholesterol, Total: 164 mg/dL (ref 100–199)
HDL: 34 mg/dL — AB (ref >39)
LDL CALC: 108 mg/dL — AB (ref 0–99)
TRIGLYCERIDES: 110 mg/dL (ref 0–149)
VLDL Cholesterol Cal: 22 mg/dL (ref 5–40)

## 2017-05-17 LAB — TSH: TSH: 1.06 u[IU]/mL (ref 0.450–4.500)

## 2017-05-17 NOTE — Patient Instructions (Addendum)
Medication Instructions:  Your physician recommends that you continue on your current medications as directed. Please refer to the Current Medication list given to you today.  Labwork: You will get blood work today:  Fasting lipids, CBC, CMET, TSH  Testing/Procedures: None ordered.  Follow-Up:  Follow up with Truitt Merle as scheduled.  Your physician wants you to follow-up in: one year with Dr. Rayann Heman.   You will receive a reminder letter in the mail two months in advance. If you don't receive a letter, please call our office to schedule the follow-up appointment.   Remote monitoring is used to monitor your ICD from home. This monitoring reduces the number of office visits required to check your device to one time per year. It allows [place] to keep an eye on the functioning of your device to ensure it is working properly. You are scheduled for a device check from home on 06/14/2017. You may send your transmission at any time that day. If you have a wireless device, the transmission will be sent automatically. After your physician reviews your transmission, you will receive a postcard with your next transmission date.  [place] phone number:  [phone]  Any Other Special Instructions Will Be Listed Below (If Applicable).  We will refer you to Dr. Broadus John for genetic testing.   If you need a refill on your cardiac medications before your next appointment, please call your pharmacy.

## 2017-05-17 NOTE — Progress Notes (Signed)
PCP: Jonathon Jordan, MD   Primary EP: Dr Rayann Heman  Robert Ochoa is a 72 y.o. male who presents today for routine electrophysiology followup.  Since last being seen in our clinic, the patient reports doing very well.  Today, he denies symptoms of palpitations, chest pain, shortness of breath,  lower extremity edema, dizziness, presyncope, syncope, or ICD shocks.  The patient is otherwise without complaint today.   Past Medical History:  Diagnosis Date  . Anxiety   . Arrhythmogenic right ventricular dysplasia (HCC)    Hx of VT s/p dual-chamber St. Jude ICD. Cardiac MRI 2010 showed moderately dilated RV with hypokinesis, LV EF 47%. EF 50-55% 2010 but down to 45% 05/2011. Cath 01/2009 without significant CAD.  [name] Atrial fibrillation or flutter    Paroxysmal. Started on Pradaxa 05/2011.  . Cardiac defibrillator in place   . LV dysfunction    per echo 10/2011; EF 35%  // Echo 2/18: EF 40-45, diff HK, Gr 1 DD, mild dilated aortic root, mod LAE, mod RVE, mild reduced RVSF, mod RAE  . Septic shock(785.52) 10-27-11   with associated respiratory/renal failure/PAF/bradycardia  . Ventricular tachycardia ([place])    ICD discharges 05/2011 for this & AF-RVR - started on amiodarone then.   Past Surgical History:  Procedure Laterality Date  . APPENDECTOMY    . CARDIAC DEFIBRILLATOR PLACEMENT  02/19/09    St,. Jude   . IMPLANTABLE CARDIOVERTER DEFIBRILLATOR (ICD) GENERATOR CHANGE N/A 03/01/2013   Procedure: ICD GENERATOR CHANGE;  Surgeon: [name], MD;  Location: [facility] CATH LAB;  Service: Cardiovascular;  Laterality: N/A;  . IMPLANTABLE CARDIOVERTER DEFIBRILLATOR GENERATOR CHANGE  03/01/13   for premature battery depletetion.  Now has a SJM Ellipse DR device placed by Dr Caryl Comes    ROS- all systems are reviewed and negative except as per HPI above  Current Outpatient Medications  Medication Sig Dispense Refill  . amiodarone (PACERONE) 200 MG tablet TAKE 1/2 TABLET(100 MG) BY MOUTH DAILY 45 tablet 2  .  amoxicillin-clavulanate (AUGMENTIN) 875-125 MG tablet Take 1 tablet by mouth 2 (two) times daily.    . Calcium-Magnesium-Zinc (CAL-MAG-ZINC PO) Take 2 tablets by mouth daily.     [name] ELIQUIS 5 MG TABS tablet TAKE 1 TABLET BY MOUTH TWICE DAILY 60 tablet 5  . lisinopril (PRINIVIL,ZESTRIL) 10 MG tablet TAKE 2 TABLETS BY MOUTH EVERY DAY 180 tablet 3  . metoprolol tartrate (LOPRESSOR) 25 MG tablet TAKE 3 TABLETS BY MOUTH TWICE DAILY 540 tablet 2  . Omega-3 Fatty Acids (FISH OIL) 1000 MG CAPS Take 3 capsules by mouth daily.     . sildenafil (VIAGRA) 100 MG tablet Take 1 tablet (100 mg total) by mouth daily as needed for erectile dysfunction. 10 tablet 0  . vitamin C (ASCORBIC ACID) 500 MG tablet Take 1,000 mg by mouth daily.      No current facility-administered medications for this visit.     Physical Exam: Vitals:   05/17/17 0904  BP: 130/62  Pulse: 61  Weight: 216 lb (98 kg)  Height: 6\' 2"  (1.88 m)    GEN- The patient is well appearing, alert and oriented x 3 today.   Head- normocephalic, atraumatic Eyes-  Sclera clear, conjunctiva pink Ears- hearing intact Oropharynx- clear Lungs- Clear to ausculation bilaterally, normal work of breathing Chest- ICD pocket is well healed Heart- Regular rate and rhythm, no murmurs, rubs or gallops, PMI not laterally displaced GI- soft, NT, ND, + BS Extremities- no clubbing, cyanosis, or edema  ICD interrogation-  reviewed in detail today,  See PACEART report  ekg tracing ordered today is personally reviewed and shows atrial paced rhythm, PR 270 msec, low voltage, nonspecific St/T changes  Assessment and Plan:  1.  VT/ARVD Stable on an appropriate medical regimen Only a single nonsustained episode 4/18 since I saw him last Normal ICD function See Claudia Desanctis Art report No changes today VT is controlled on amiodarone 100mg  daily Labs 8/18 reviewed CT chest 8/18 also reviewed Will obtain LFTs, TFTs Followed by pulmonary also We did genetic testing  years ago.  Results at that time were somewhat inconclusive.  I would like for him to see Dr Broadus John to evaluate his ARVD further and review prior genetic testing.    2. afib Well controlled (<1%, longest 4 minutes) On eliquis Bmet, cbc today  3. Chronic systolic dysfunction Echo 2/18 reviewed Continue medical therapy Fasting lipids today  4. Previously enlarged aortic root CT 8/18 is reviewed and reveals no thoracic aneurysm    Merlin Return to see Evorn Gong in 6 months I will see in a year  Thompson Grayer MD, [facility] 05/17/2017 9:24 AM

## 2017-05-26 ENCOUNTER — Other Ambulatory Visit: Payer: Self-pay | Admitting: Internal Medicine

## 2017-06-14 ENCOUNTER — Ambulatory Visit (INDEPENDENT_AMBULATORY_CARE_PROVIDER_SITE_OTHER): Payer: Medicare Other | Admitting: *Deleted

## 2017-06-14 DIAGNOSIS — I472 Ventricular tachycardia: Secondary | ICD-10-CM

## 2017-06-14 DIAGNOSIS — I4729 Other ventricular tachycardia: Secondary | ICD-10-CM

## 2017-06-14 NOTE — Progress Notes (Signed)
Remote ICD transmission.   

## 2017-06-15 LAB — CUP PACEART REMOTE DEVICE CHECK
Date Time Interrogation Session: 20190319103134
Implantable Lead Implant Date: 20101123
Implantable Lead Location: 753860
Implantable Lead Model: 7121
Implantable Pulse Generator Implant Date: 20141203
MDC IDC LEAD IMPLANT DT: 20101123
MDC IDC LEAD LOCATION: 753859
MDC IDC SET LEADCHNL RA PACING AMPLITUDE: 2.5 V
MDC IDC SET LEADCHNL RV PACING AMPLITUDE: 2.5 V
MDC IDC SET LEADCHNL RV PACING PULSEWIDTH: 0.5 ms
MDC IDC SET LEADCHNL RV SENSING SENSITIVITY: 0.5 mV
Pulse Gen Serial Number: 7136426

## 2017-06-16 ENCOUNTER — Encounter: Payer: Self-pay | Admitting: Cardiology

## 2017-06-23 ENCOUNTER — Other Ambulatory Visit: Payer: Self-pay | Admitting: Internal Medicine

## 2017-06-24 ENCOUNTER — Ambulatory Visit: Payer: Medicare Other | Admitting: Genetic Counselor

## 2017-06-29 NOTE — Progress Notes (Signed)
Referral Reason  Robert Ochoa was referred by Dr. Rayann Ochoa for a reevaluation of his genetic testing of arrhythmogenic cardiomyopathy [facility]; formerly Arrhythmogenic Right Ventricular Cardiomyopathy, ARVC) that was performed in 2010/2011.   Genetic Consultation Notes  Robert Ochoa is an inherited cardiomyopathy that predisposes patients to life-threatening ventricular arrhythmias and is responsible for 20% of SCD events under the age of 26. Robert Ochoa was counseled on the genetics of Robert Ochoa. We discussed autosomal dominant inheritance seen in [facility] and the possibility of having the disorder as a result of a de novo pathogenic variant. The proportion of cases caused by a de novo variant is unknown. Each child of an individual with autosomal dominant Robert Ochoa has a 50% chance of inheriting the pathogenic variant. Robert Ochoa may also be inherited in a digenic manner (i.e., a single allele of two different genes has a pathogenic variant). We walked through the process of genetic testing and discussed the potential outcomes of genetic testing and subsequent management of at-risk family members. Robert Ochoa does not have a copy of his test report other than that he was told it was inconclusive.   His medical and 5-generation family history was obtained. See details below-  Robert Ochoa (III.1 on pedigree) is a 72 year old pleasant Caucasian gentleman who was diagnosed with [facility] in November 2010 at age 61. He tells me that he had a syncopal episode subsequent to working out on a Music therapist at [employer].  EMS was called in and he was found to have VT. He was taken to the ER and several imaging studies were performed that indicated a likely diagnosis of ARVC. He was retained at the hospital over the weekend and an ICD was implanted soon after. He does report having appropriate shocks in 2012 and early 2013; none since then.   Family history Robert Ochoa (III.1) has a younger sister (III.2). His 69 year-old daughter (IV.1) and nephews and nieces  (IV.2-IV.4) and their kids (V.1-V.6) are in relatively good health. He informs me that his daughter and sister had a normal echocardiogram soon after he was diagnosed with ARVC, but have not pursued regular surveillance for ARVC.  He reports two paternal relatives with sudden death. His father (II.8) died suddenly at age 62. He says that he was walking at work and collapsed. He could not be revived. He also mentions that his dad had atherosclerosis and believes that may have contributed to his death. His paternal grandfather (I.1) died suddenly in an elevator at age 53. An autopsy was not performed on both these relatives to confirm their cause of death.    Impression  Robert Ochoa is concerned about his daughter's risk of ARVC. He would like to have his genetic test report re-evaluated as he was told that his result was inconclusive in 2011.  Plan It is vital to obtain a copy of his test report so that it can be re-interpreted. I will reach out to Dr. Rayann Ochoa and will search through Robert Ochoa's medical records to find a copy of his report. Robert Ochoa will also reach out to Medical Records to obtain more information about his report.  Test Interpretation Robert Ochoa's test report was found under the "Media" tab with an entry marked "Correspondence" dated 03/07/2009. The test was performed by [facility] and included all except one of the major ARVC genes, namely, PKP2, DSG2, DSC2, DSP and TMEM43. JUP was not included in this panel. Approximately 2% of ARVC is attributed to pathogenic variants in JUP gene.  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 This variant has been reported in three individuals with ARVC Robert Ochoa et al., 2010, Haggerty et al., 2017) and by two other clinical diagnostic labs. This variant is located in a well-established functional domain that also contains the highest number of mutations in DSG2. Additionally, computational algorithms suggest a likely  pathogenic role for this variant. However, as very few individuals have been reported to harbor this variant and its role in gene function has not been elucidated the DSG2 p.Phe321Ile variant is classified as a variant of unknown clinical significance.   In light of the molecular evidence for this variant, it is not recommended to pursue genetic testing of the familial variant in Robert Ochoa's daughter at this time.   Lattie Corns, Ph.D, [facility] Clinical Molecular Geneticist

## 2017-09-13 ENCOUNTER — Ambulatory Visit (INDEPENDENT_AMBULATORY_CARE_PROVIDER_SITE_OTHER): Payer: Medicare Other | Admitting: *Deleted

## 2017-09-13 DIAGNOSIS — I472 Ventricular tachycardia: Secondary | ICD-10-CM

## 2017-09-13 DIAGNOSIS — I4729 Other ventricular tachycardia: Secondary | ICD-10-CM

## 2017-09-13 NOTE — Progress Notes (Signed)
Remote ICD transmission.   

## 2017-09-25 LAB — CUP PACEART REMOTE DEVICE CHECK
Date Time Interrogation Session: 20190629133951
Implantable Lead Implant Date: 20101123
Implantable Lead Location: 753860
MDC IDC LEAD IMPLANT DT: 20101123
MDC IDC LEAD LOCATION: 753859
MDC IDC PG IMPLANT DT: 20141203
MDC IDC PG SERIAL: 7136426

## 2017-10-15 DIAGNOSIS — L570 Actinic keratosis: Secondary | ICD-10-CM | POA: Diagnosis not present

## 2017-10-15 DIAGNOSIS — Z85828 Personal history of other malignant neoplasm of skin: Secondary | ICD-10-CM | POA: Diagnosis not present

## 2017-10-15 DIAGNOSIS — L821 Other seborrheic keratosis: Secondary | ICD-10-CM | POA: Diagnosis not present

## 2017-11-09 ENCOUNTER — Ambulatory Visit (INDEPENDENT_AMBULATORY_CARE_PROVIDER_SITE_OTHER): Payer: Medicare Other | Admitting: Nurse Practitioner

## 2017-11-09 ENCOUNTER — Encounter: Payer: Self-pay | Admitting: Nurse Practitioner

## 2017-11-09 VITALS — BP 108/60 | HR 60 | Ht 74.0 in | Wt 215.8 lb

## 2017-11-09 DIAGNOSIS — E785 Hyperlipidemia, unspecified: Secondary | ICD-10-CM | POA: Diagnosis not present

## 2017-11-09 DIAGNOSIS — I428 Other cardiomyopathies: Secondary | ICD-10-CM | POA: Diagnosis not present

## 2017-11-09 DIAGNOSIS — I4729 Other ventricular tachycardia: Secondary | ICD-10-CM

## 2017-11-09 DIAGNOSIS — I472 Ventricular tachycardia: Secondary | ICD-10-CM | POA: Diagnosis not present

## 2017-11-09 DIAGNOSIS — Z79899 Other long term (current) drug therapy: Secondary | ICD-10-CM | POA: Diagnosis not present

## 2017-11-09 NOTE — Patient Instructions (Addendum)
We will be checking the following labs today - BMET, CBC, HPF, Lipids and TSH   Medication Instructions:    Continue with your current medicines.     Testing/Procedures To Be Arranged:  N/A  Follow-Up:   See Dr. Rayann Heman as planned in February - I will see you next August    Other Special Instructions:   Keep up the good work - but - Go to bed hungry :)     If you need a refill on your cardiac medications before your next appointment, please call your pharmacy.   Call the [place] office at [phone] if you have any questions, problems or concerns.

## 2017-11-09 NOTE — Progress Notes (Signed)
CARDIOLOGY OFFICE NOTE  Date:  11/09/2017    Robert Ochoa Date of Birth: [birth year] Medical Record #[id number]  PCP:  Jonathon Jordan, MD  Cardiologist:  [facility]    Chief Complaint  Patient presents with  . Atrial Fibrillation    Follow up visit - seen for Dr. Rayann Heman    History of Present Illness: Robert Ochoa is a 72 y.o. male who presents today for a follow up visit. Seen for Dr. Rayann Heman.   He has multiple medical issues which include past pneumonia with associated sepsis, renal failure and respiratory failure back in 2013. Has atrial fib - maintained in NSR on very low dose amiodarone. Has an ICD/pacemaker in place with a set lower rate of 60. Last EF was 35 to 40% back in October of 2013 and increased to 45 to 50% per echo from October of 2014. Normal EF by echo from 2016. He is managed medically. His other issues include right ventricular dysplasia, ICD implant, PAF and paroxysmal VT. He remains on chronic amiodarone and chronic Eliquis. Last PFT was January of 2017. No CAD per cath back in 2010.   I saw him back in [place] 2017 - he was actively losing weight. He was doing great and happy with how he was feeling. We got his echo updated due to aortic root enlargement - this has showed that his EF haddropped back - he is not able to have an MRI due his device implant. He is on a good CHF regimen. His study was reviewed with Dr. Rayann Heman who wanted to keep him on his current regimen and follow him along. Patient was agreeable to continuing with his current management with plans to repeat echo 6 months later - this was done back in February - EF 40 to 45% - still doing well.  Last seen by Dr. Rayann Heman back in February of 2019l - was doing well. I last saw him in August of 2018. Dr. Rayann Heman did refer him to Dr. Broadus John for consideration of repeat genetic testing.   Dr. 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 This variant has been reported in three individuals with ARVC [name] et al., 2010, Haggerty et al., 2017) and by two other clinical diagnostic labs. This variant is located in a well-established functional domain that also contains the highest number of mutations in DSG2. Additionally, computational algorithms suggest a likely pathogenic role for this variant. However, as very few individuals have been reported to harbor this variant and its role in gene function has not been elucidated the DSG2 p.Phe321Ile variant is classified as a variant of unknown clinical significance.   In light of the molecular evidence for this variant, it is not recommended to pursue genetic testing of the familial variant in Robert Ochoa at this time".   Comes in today. Here alone. he continues to do well. Feels good. Happy with how he is doing other than gaining a few pounds back. Admits he is back to eating late at night. He is walking 3 [place] a day. No chest pain. Breathing is good. He was asking about the conclusions from Dr. Broadus John which I read to him. He is fasting today. Needs labs. No bleeding/excessive bruising. No shocks. No palpitations.   Past Medical History:  Diagnosis Date  . Anxiety   . Arrhythmogenic right ventricular dysplasia (HCC)    Hx of VT s/p dual-chamber St. Jude ICD. Cardiac  MRI 2010 showed moderately dilated RV with hypokinesis, LV EF 47%. EF 50-55% 2010 but down to 45% 05/2011. Cath 01/2009 without significant CAD.  [name] Atrial fibrillation or flutter    Paroxysmal. Started on Pradaxa 05/2011.  . Cardiac defibrillator in place   . LV dysfunction    per echo 10/2011; EF 35%  // Echo 2/18: EF 40-45, diff HK, Gr 1 DD, mild dilated aortic root, mod LAE, mod RVE, mild reduced RVSF, mod RAE  . Septic shock(785.52) 10-27-11   with associated respiratory/renal failure/PAF/bradycardia  . Ventricular tachycardia ([place])    ICD discharges 05/2011 for this & AF-RVR - started on amiodarone  then.    Past Surgical History:  Procedure Laterality Date  . APPENDECTOMY    . CARDIAC DEFIBRILLATOR PLACEMENT  02/19/09    St,. Jude   . IMPLANTABLE CARDIOVERTER DEFIBRILLATOR (ICD) GENERATOR CHANGE N/A 03/01/2013   Procedure: ICD GENERATOR CHANGE;  Surgeon: [name], MD;  Location[facility] C[facility] LAB;  Service: Cardiovascular;  Laterality: N/A;  . IMPLANTABLE CARDIOVERTER DEFIBRILLATOR GENERATOR CHANGE  03/01/13   for premature battery depletetion.  Now has a SJM Ellipse DR device placed by Dr Caryl Comes     Medications: Current Meds  Medication Sig  . amiodarone (PACERONE) 200 MG tablet TAKE 1/2 TABLET(100 MG) BY MOUTH DAILY  . Calcium-Magnesium-Zinc (CAL-MAG-ZINC PO) Take 2 tablets by mouth daily.   [name] ELIQUIS 5 MG TABS tablet TAKE 1 TABLET BY MOUTH TWICE DAILY  . lisinopril (PRINIVIL,ZESTRIL) 10 MG tablet TAKE 2 TABLETS BY MOUTH EVERY DAY  . metoprolol tartrate (LOPRESSOR) 25 MG tablet TAKE 3 TABLETS BY MOUTH TWICE DAILY  . Omega-3 Fatty Acids (FISH OIL) 1000 MG CAPS Take 3 capsules by mouth daily.   . sildenafil (VIAGRA) 100 MG tablet Take 1 tablet (100 mg total) by mouth daily as needed for erectile dysfunction.  . vitamin C (ASCORBIC ACID) 500 MG tablet Take 1,000 mg by mouth daily.      Allergies: No Known Allergies  Social History: The patient  reports that he has never smoked. He has never used smokeless tobacco. He reports that he drinks alcohol. He reports that he does not use drugs.   Family History: The patient's family history includes Allergies in his mother; Heart attack in his father.   Review of Systems: Please see the history of present illness.   Otherwise, the review of systems is positive for none.   All other systems are reviewed and negative.   Physical Exam: VS:  BP 108/60 (BP Location: Left Arm, Patient Position: Sitting, Cuff Size: Normal)   Pulse 60   Ht 6\' 2"  (1.88 m)   Wt 215 lb 12.8 oz (97.9 kg)   BMI 27.71 kg/m  .  BMI Body mass index is 27.71  kg/m.  Wt Readings from Last 3 Encounters:  11/09/17 215 lb 12.8 oz (97.9 kg)  05/17/17 216 lb (98 kg)  11/03/16 209 lb 6.4 oz (95 kg)    General: Pleasant. Well developed, well nourished and in no acute distress.  His weight is up 6 pounds over the past year.  HEENT: Normal.  Neck: Supple, no JVD, carotid bruits, or masses noted.  Cardiac: Regular rate and rhythm. No murmurs, rubs, or gallops. No edema.  Respiratory:  Lungs are clear to auscultation bilaterally with normal work of breathing.  GI: Soft and nontender.  MS: No deformity or atrophy. Gait and ROM intact.  Skin: Warm and dry. Color is normal.  Neuro:  Strength and  sensation are intact and no gross focal deficits noted.  Psych: Alert, appropriate and with normal affect.   LABORATORY DATA:  EKG:  EKG is ordered today. This demonstrates NSR - A pacing, low voltage, inferior Q's and anterolateral T wave changes.  Lab Results  Component Value Date   WBC 6.8 05/17/2017   HGB 15.6 05/17/2017   HCT 45.9 05/17/2017   PLT 300 05/17/2017   GLUCOSE 101 (H) 05/17/2017   CHOL 164 05/17/2017   TRIG 110 05/17/2017   HDL 34 (L) 05/17/2017   LDLCALC 108 (H) 05/17/2017   ALT 30 05/17/2017   AST 27 05/17/2017   NA 136 05/17/2017   K 4.5 05/17/2017   CL 100 05/17/2017   CREATININE 1.04 05/17/2017   BUN 18 05/17/2017   CO2 19 (L) 05/17/2017   TSH 1.060 05/17/2017   INR 1.18 10/30/2011     BNP (last 3 results) No results for input(s): BNP in the last 8760 hours.  ProBNP (last 3 results) No results for input(s): PROBNP in the last 8760 hours.   Other Studies Reviewed Today:  Echo Study Conclusions from 04/2016  - Left ventricle: The cavity size was normal. Wall thickness was normal. Systolic function was mildly to moderately reduced. The estimated ejection fraction was in the range of 40% to 45%. Diffuse hypokinesis. Doppler parameters are consistent with abnormal left ventricular relaxation (grade 1  diastolic dysfunction). - Aortic root: The aortic root was mildly dilated. - Left atrium: The atrium was moderately dilated. - Right ventricle: The cavity size was moderately dilated. Systolic function was mildly reduced. - Right atrium: The atrium was moderately dilated.  Impressions:  - Mild to moderate global reduction in LV systolic function; grade 1 diastolic dysfunction; moderate biatrial enlargement; moderate RVE with mildly reduced function.   Echo Study Conclusions from 09/2015  - Left ventricle: The cavity size was mildly dilated. Wall thickness was normal. Systolic function was moderately to severely reduced. The estimated ejection fraction was in the range of 30% to 35%. Doppler parameters are consistent with abnormal left ventricular relaxation (grade 1 diastolic dysfunction). - Right ventricle: The cavity size was mildly dilated. Systolic function was moderately to severely reduced. - Right atrium: The atrium was mildly dilated.   Echo Study Conclusions from 09/2014  - Left ventricle: The cavity size was normal. Wall thickness was  increased in a pattern of mild LVH. Systolic function was normal.  The estimated ejection fraction was in the range of 50% to 55%.  Doppler parameters are consistent with abnormal left ventricular  relaxation (grade 1 diastolic dysfunction). The E/e&' ratio is <8,  suggesting normal LV filling pressure. - Aorta: Aortic root dimension: 42 mm (ED). - Aortic root: The aortic root is dilated. - Left atrium: The atrium was normal in size. - Right atrium: The atrium was mildly dilated. - Tricuspid valve: There was mild regurgitation. - Pulmonary arteries: PA peak pressure: 33 mm Hg (S). - Inferior vena cava: The vessel was normal in size. The  respirophasic diameter changes were in the normal range (>= 50%),  consistent with normal central venous pressure.  Impressions:  - LVEF 50-55%, incoordinate  septal motion, AICD wires in place,  diastolic dysfunction, normal LV filling pressure, mildly dilated  aortic root to 4.2 cm, mildly dilated RA, mild TR, top normal  RVSP at 33 mmHg.   Echo Study Conclusions from 2014  - Left ventricle: The cavity size was normal. Wall thickness was normal. Systolic function was mildly reduced. The  estimated ejection fraction was in the range of 45% to 50%. Diffuse hypokinesis. Doppler parameters are consistent with abnormal left ventricular relaxation (grade 1 diastolic dysfunction). - Left atrium: The atrium was mildly dilated. - Right ventricle: The cavity size was moderately to severely dilated. Systolic function was moderately reduced. - Right atrium: The atrium was moderately dilated. - Atrial septum: There was an atrial septal aneurysm. - Pulmonary arteries: PA peak pressure: 36mm Hg (S).  Assessment/Plan:   1. Systolic heart failure/nonischemic CM - EF back to 40 to 45% by last echo - he is doing well. NYHA I/II. No changes made today.  Encouraged him to stay on track.   2. ARVD - has his ICD in place.Followed by Dr. Rayann Heman - to see back in February of 2020. He has had consultation with Dr. Broadus John regarding the genetics - I read the impression to him - his Ochoa does not need testing.   3. VT/PAF - on chronic amiodarone - needs surveillance labs today. He is followed by pulmonary and EP. Device in place.   4. Chronic anticoagulation - on Eliquis - no problems noted. Lab today.   5. Enlarged aortic root - no aneurysm by CT from 2018.   6. Low diffusion capacity on PFTs - followed by Dr. Elsworth Soho - no evidence of pulmonary HTN on echo - only way to clearly evaluate pulmonary hypertension would be a right heart cath.Since he is asymptomatic Dr. Elsworth Soho does not feel this needs to be pursued at this time. He remains asymptomatic. Endurance level is good. He remains active.   7. HLD - labs today.    Current medicines  are reviewed with the patient today.  The patient does not have concerns regarding medicines other than what has been noted above.  The following changes have been made:  See above.  Labs/ tests ordered today include:    Orders Placed This Encounter  Procedures  . Basic metabolic panel  . CBC  . Hepatic function panel  . Lipid panel  . TSH  . EKG 12-Lead     Disposition:   FU with Dr. Rayann Heman in 6 months - I will see back in one year. Lab today.    Patient is agreeable to this plan and will call if any problems develop in the interim.   SignedTruitt Merle, NP  11/09/2017 11:34 AM  [facility] [address] [place], [place]  [address] Phone: [phone] Fax: [phone]

## 2017-11-10 LAB — LIPID PANEL
Chol/HDL Ratio: 4.3 ratio (ref 0.0–5.0)
Cholesterol, Total: 167 mg/dL (ref 100–199)
HDL: 39 mg/dL — ABNORMAL LOW (ref >39)
LDL Calculated: 107 mg/dL — ABNORMAL HIGH (ref 0–99)
Triglycerides: 106 mg/dL (ref 0–149)
VLDL Cholesterol Cal: 21 mg/dL (ref 5–40)

## 2017-11-10 LAB — CBC
Hematocrit: 50.1 % (ref 37.5–51.0)
Hemoglobin: 16.3 g/dL (ref 13.0–17.7)
MCH: 28.1 pg (ref 26.6–33.0)
MCHC: 32.5 g/dL (ref 31.5–35.7)
MCV: 86 fL (ref 79–97)
Platelets: 202 10*3/uL (ref 150–450)
RBC: 5.81 x10E6/uL — ABNORMAL HIGH (ref 4.14–5.80)
RDW: 13.1 % (ref 12.3–15.4)
WBC: 6.3 10*3/uL (ref 3.4–10.8)

## 2017-11-10 LAB — HEPATIC FUNCTION PANEL
ALT: 19 IU/L (ref 0–44)
AST: 25 IU/L (ref 0–40)
Albumin: 4.3 g/dL (ref 3.5–4.8)
Alkaline Phosphatase: 65 IU/L (ref 39–117)
Bilirubin Total: 1.2 mg/dL (ref 0.0–1.2)
Bilirubin, Direct: 0.26 mg/dL (ref 0.00–0.40)
Total Protein: 7.1 g/dL (ref 6.0–8.5)

## 2017-11-10 LAB — BASIC METABOLIC PANEL
BUN/Creatinine Ratio: 17 (ref 10–24)
BUN: 20 mg/dL (ref 8–27)
CO2: 20 mmol/L (ref 20–29)
Calcium: 9.4 mg/dL (ref 8.6–10.2)
Chloride: 102 mmol/L (ref 96–106)
Creatinine, Ser: 1.2 mg/dL (ref 0.76–1.27)
GFR calc Af Amer: 69 mL/min/{1.73_m2} (ref >59)
GFR calc non Af Amer: 60 mL/min/{1.73_m2} (ref >59)
Glucose: 92 mg/dL (ref 65–99)
Potassium: 5.3 mmol/L — ABNORMAL HIGH (ref 3.5–5.2)
Sodium: 138 mmol/L (ref 134–144)

## 2017-11-10 LAB — TSH: TSH: 1.15 u[IU]/mL (ref 0.450–4.500)

## 2017-11-20 ENCOUNTER — Other Ambulatory Visit: Payer: Self-pay | Admitting: Internal Medicine

## 2017-12-09 ENCOUNTER — Ambulatory Visit
Admission: RE | Admit: 2017-12-09 | Discharge: 2017-12-09 | Disposition: A | Discharge status: Home / Self Care | Payer: Medicare Other | Source: Ambulatory Visit | Attending: Family Medicine | Admitting: Family Medicine

## 2017-12-09 ENCOUNTER — Other Ambulatory Visit: Payer: Self-pay | Admitting: Family Medicine

## 2017-12-09 DIAGNOSIS — Z23 Encounter for immunization: Secondary | ICD-10-CM | POA: Diagnosis not present

## 2017-12-09 DIAGNOSIS — H6123 Impacted cerumen, bilateral: Secondary | ICD-10-CM | POA: Diagnosis not present

## 2017-12-09 DIAGNOSIS — R109 Unspecified abdominal pain: Secondary | ICD-10-CM | POA: Diagnosis not present

## 2017-12-09 DIAGNOSIS — S2231XA Fracture of one rib, right side, initial encounter for closed fracture: Secondary | ICD-10-CM | POA: Diagnosis not present

## 2017-12-13 ENCOUNTER — Ambulatory Visit (INDEPENDENT_AMBULATORY_CARE_PROVIDER_SITE_OTHER): Payer: Medicare Other | Admitting: *Deleted

## 2017-12-13 DIAGNOSIS — I4729 Other ventricular tachycardia: Secondary | ICD-10-CM

## 2017-12-13 DIAGNOSIS — I472 Ventricular tachycardia: Secondary | ICD-10-CM

## 2017-12-13 NOTE — Progress Notes (Signed)
Remote ICD transmission.   

## 2017-12-27 DIAGNOSIS — N400 Enlarged prostate without lower urinary tract symptoms: Secondary | ICD-10-CM | POA: Diagnosis not present

## 2017-12-27 DIAGNOSIS — N5201 Erectile dysfunction due to arterial insufficiency: Secondary | ICD-10-CM | POA: Diagnosis not present

## 2017-12-27 DIAGNOSIS — R972 Elevated prostate specific antigen [PSA]: Secondary | ICD-10-CM | POA: Diagnosis not present

## 2018-01-13 LAB — CUP PACEART REMOTE DEVICE CHECK
Date Time Interrogation Session: 20191017100629
Implantable Lead Implant Date: 20101123
Implantable Lead Location: 753859
Implantable Lead Location: 753860
MDC IDC LEAD IMPLANT DT: 20101123
MDC IDC PG IMPLANT DT: 20141203
Pulse Gen Serial Number: 7136426

## 2018-01-17 ENCOUNTER — Other Ambulatory Visit: Payer: Self-pay | Admitting: Internal Medicine

## 2018-01-20 ENCOUNTER — Other Ambulatory Visit: Payer: Self-pay | Admitting: Internal Medicine

## 2018-01-20 NOTE — Telephone Encounter (Signed)
Outpatient Medication Detail    Disp Refills Start End   lisinopril (PRINIVIL,ZESTRIL) 10 MG tablet 180 tablet 3 06/23/2017    Sig: TAKE 2 TABLETS BY MOUTH EVERY DAY   Sent to pharmacy as: lisinopril (PRINIVIL,ZESTRIL) 10 MG tablet   E-Prescribing Status: Receipt confirmed by pharmacy (06/23/2017 3:00 PM EDT)   [place], [place]

## 2018-03-14 ENCOUNTER — Ambulatory Visit (INDEPENDENT_AMBULATORY_CARE_PROVIDER_SITE_OTHER): Payer: Medicare Other

## 2018-03-14 DIAGNOSIS — I472 Ventricular tachycardia: Secondary | ICD-10-CM

## 2018-03-14 DIAGNOSIS — I4729 Other ventricular tachycardia: Secondary | ICD-10-CM

## 2018-03-14 DIAGNOSIS — I428 Other cardiomyopathies: Secondary | ICD-10-CM

## 2018-03-14 NOTE — Progress Notes (Signed)
Remote ICD transmission.   

## 2018-03-15 ENCOUNTER — Encounter: Payer: Self-pay | Admitting: Cardiology

## 2018-03-24 ENCOUNTER — Telehealth: Payer: Self-pay | Admitting: Internal Medicine

## 2018-03-24 NOTE — Telephone Encounter (Signed)
New Message          Patient calling the office for samples of medication:   1.  What medication and dosage are you requesting samples for?Eliquis   2.  Are you currently out of this medication? Will be after tonight     Patient is in the doughnut hole,only need a week supply

## 2018-03-24 NOTE — Telephone Encounter (Signed)
Spoke with patient and made him aware that we do not have any samples available. He states that he was just trying to get enough to get through until Jan 1 but he will go pick up his refill at the pharmacy. I made him aware that if he is in the donut hole and the refill is too expensive he doesn't have to purchase the full ninety day supply, he can just request the amount of pills that is needed to get him through until Jan 1 when his insurance will start over again. He verbalized appreciation for the advice as he was not aware that he could do that.

## 2018-04-22 DIAGNOSIS — L570 Actinic keratosis: Secondary | ICD-10-CM | POA: Diagnosis not present

## 2018-04-22 DIAGNOSIS — L821 Other seborrheic keratosis: Secondary | ICD-10-CM | POA: Diagnosis not present

## 2018-04-22 DIAGNOSIS — D1801 Hemangioma of skin and subcutaneous tissue: Secondary | ICD-10-CM | POA: Diagnosis not present

## 2018-04-22 DIAGNOSIS — L812 Freckles: Secondary | ICD-10-CM | POA: Diagnosis not present

## 2018-04-22 DIAGNOSIS — Z85828 Personal history of other malignant neoplasm of skin: Secondary | ICD-10-CM | POA: Diagnosis not present

## 2018-04-22 DIAGNOSIS — D225 Melanocytic nevi of trunk: Secondary | ICD-10-CM | POA: Diagnosis not present

## 2018-04-24 LAB — CUP PACEART REMOTE DEVICE CHECK
Date Time Interrogation Session: 20200126070905
Implantable Lead Location: 753859
Implantable Lead Location: 753860
MDC IDC LEAD IMPLANT DT: 20101123
MDC IDC LEAD IMPLANT DT: 20101123
MDC IDC PG IMPLANT DT: 20141203
MDC IDC PG SERIAL: 7136426

## 2018-04-30 IMAGING — CT CT ANGIO CHEST
2 of 7 series · 18 of 46 positions shown · IV contrast (isovue)
Comparison: Chest CT dated 10/30/2011.

CLINICAL DATA: Follow-up thoracic aortic aneurysm on ECHO.

EXAM:
CT ANGIOGRAPHY CHEST WITH CONTRAST
TECHNIQUE: Multidetector CT imaging of the chest was performed using the
standard protocol during bolus administration of intravenous
contrast. Multiplanar CT image reconstructions and MIPs were
obtained to evaluate the vascular anatomy.
CONTRAST:  100 cc Isovue 370

[Series 4: aorta 3.0 i31f 2 · axial · 0.90mm/px · z∈[-394,-58]mm · 15 of 122 slices shown]
[im 5/122  lung]
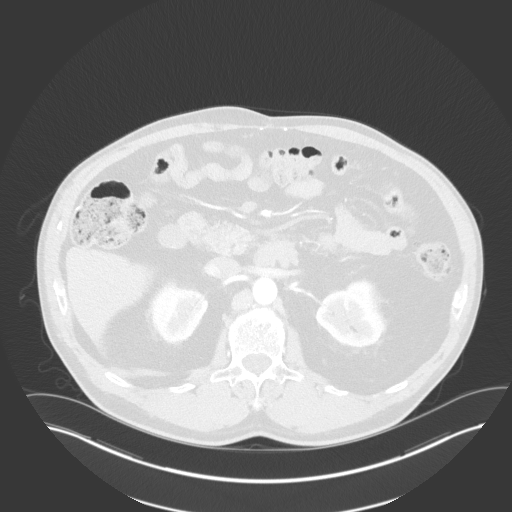
[im 14/122  soft-tissue]
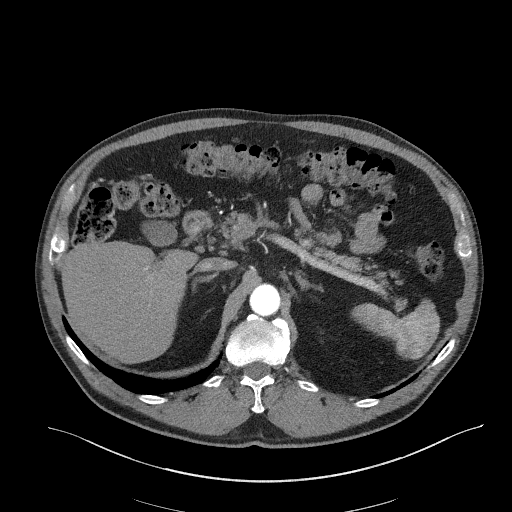
[im 23/122  lung]
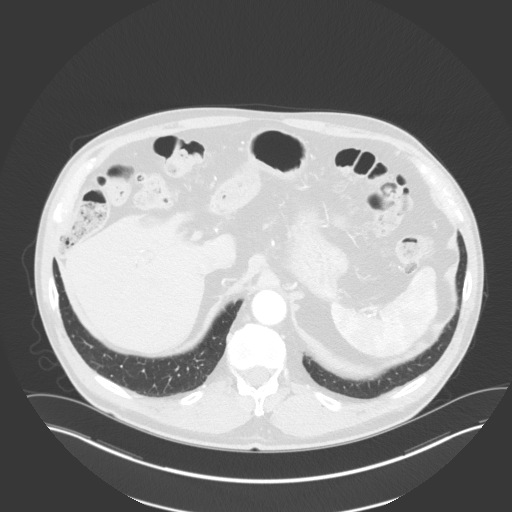
[im 32/122  soft-tissue]
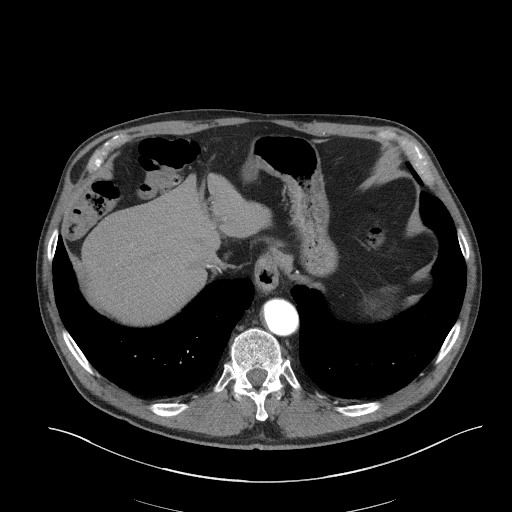
[im 36/122  lung]
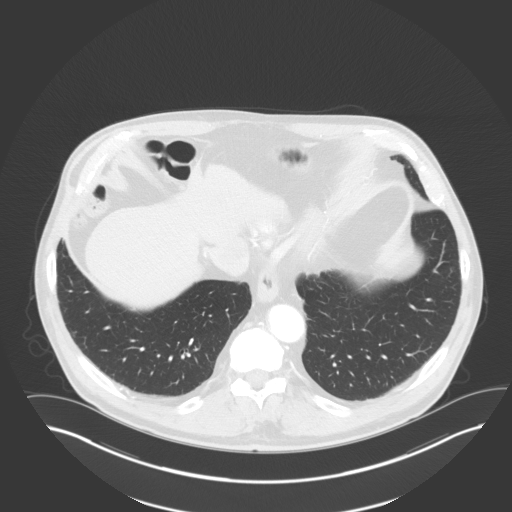
[im 45/122  soft-tissue]
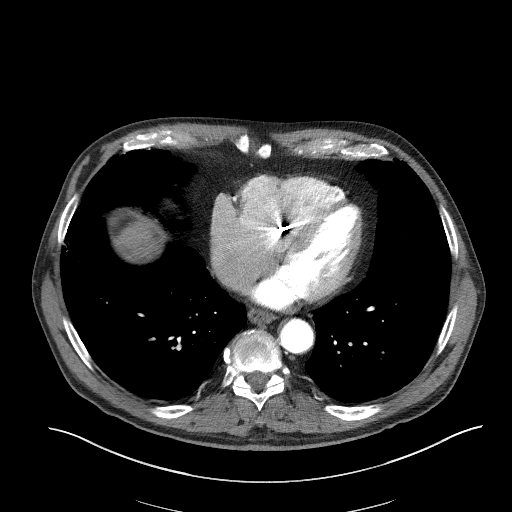
[im 54/122  lung]
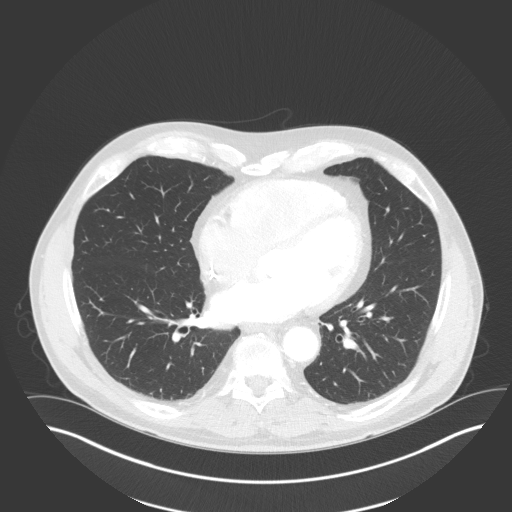
[im 63/122  soft-tissue]
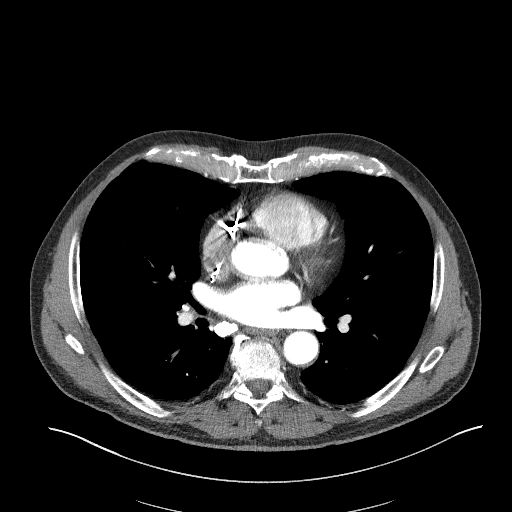
[im 68/122  lung]
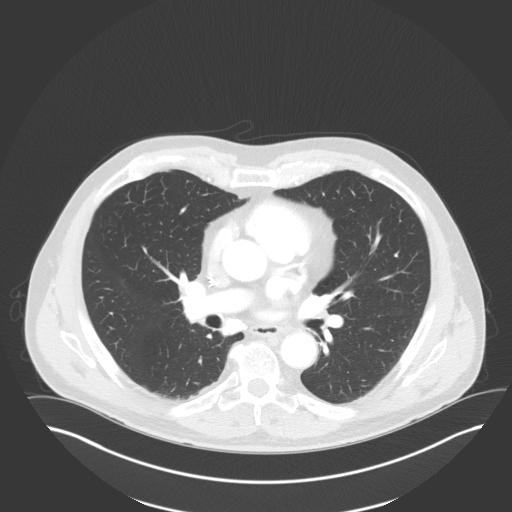
[im 77/122  soft-tissue]
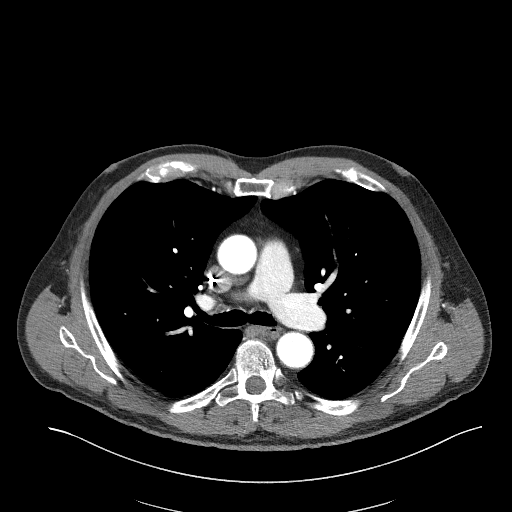
[im 86/122  lung]
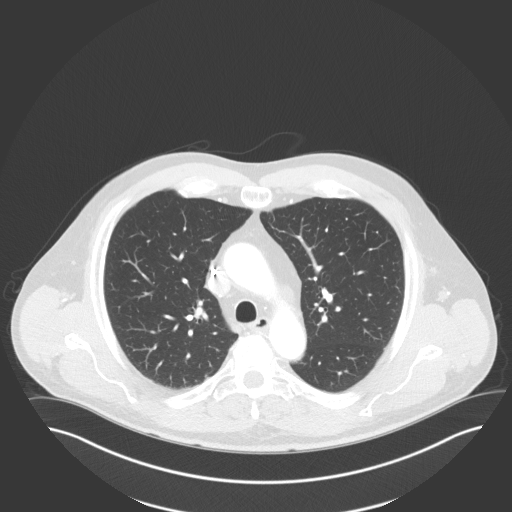
[im 90/122  soft-tissue]
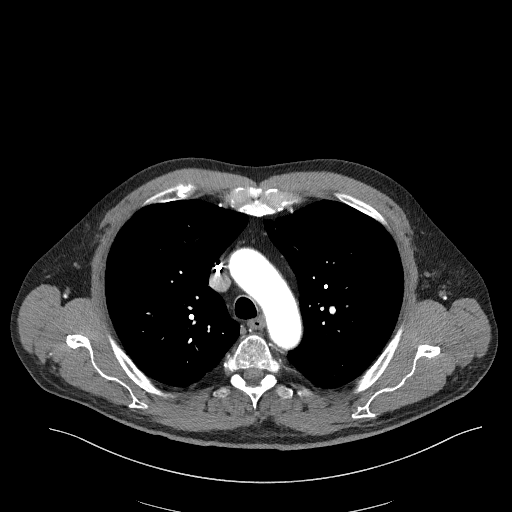
[im 99/122  lung]
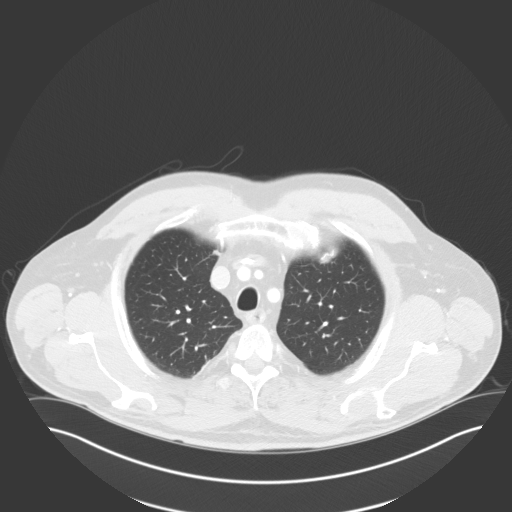
[im 108/122  soft-tissue]
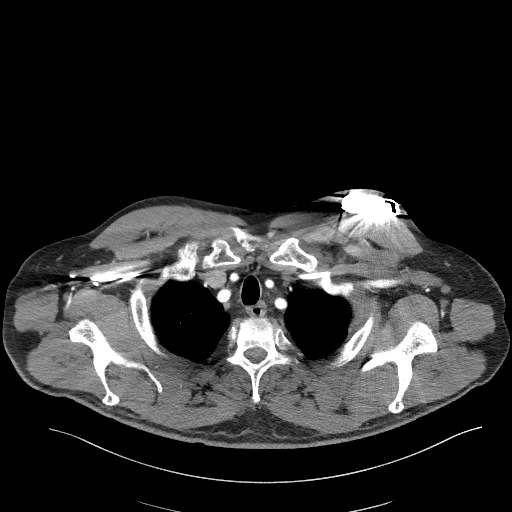
[im 117/122  lung]
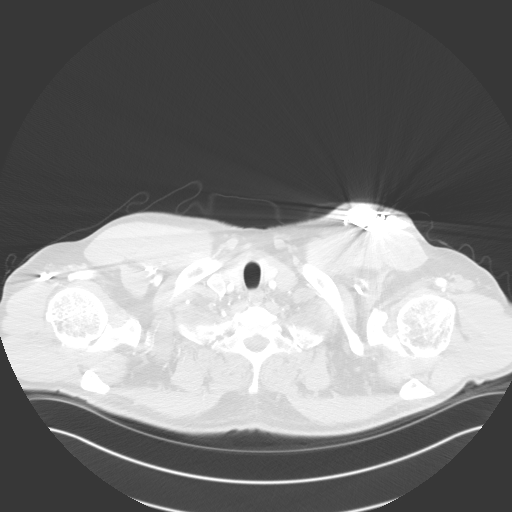

[Series 7: coronals · coronal · 0.72mm/px · 3 of 136 slices shown]
[im 34/136  soft-tissue]
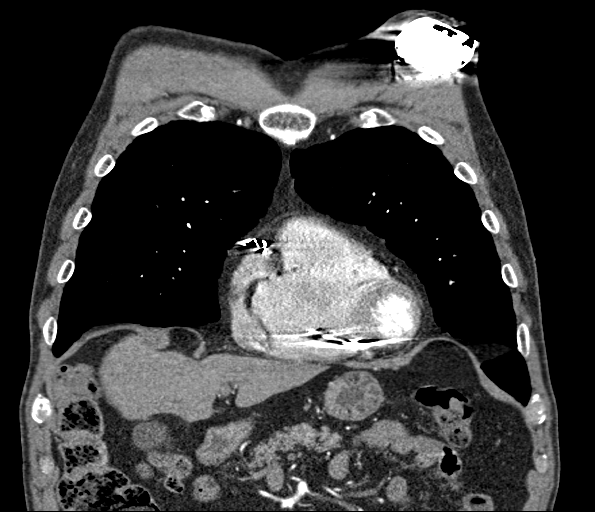
[im 68/136  soft-tissue]
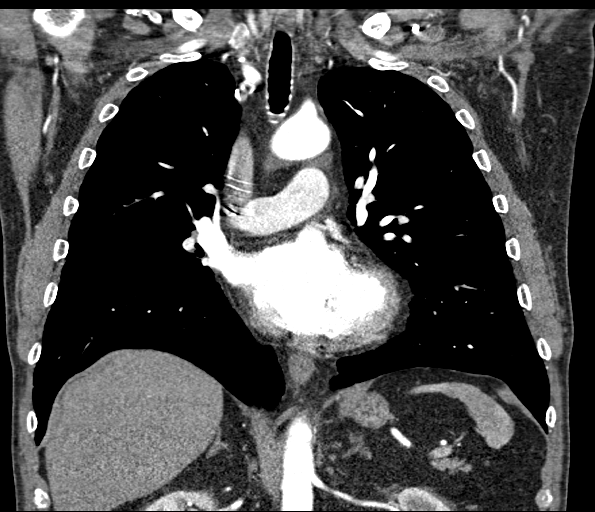
[im 102/136  soft-tissue]
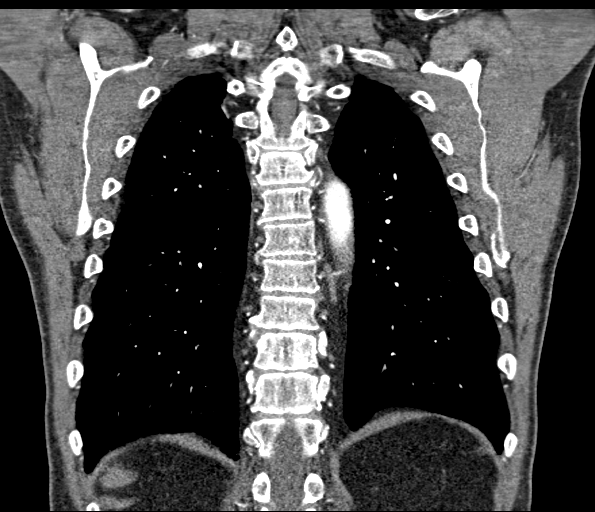

[18 of 46 positions shown; findings below may reference images not displayed]

FINDINGS: Cardiovascular: Thoracic aorta is normal in caliber and
configuration throughout. No aortic aneurysm or dissection.

Heart size is normal. No pericardial effusion. Central pulmonary
arteries are unremarkable.

Mediastinum/Nodes: No mass or enlarged lymph nodes within the
mediastinum or perihilar regions. Calcified lymph node within the
subcarinal space indicating previous granulomatous infection.
Esophagus is unremarkable. Trachea and central bronchi are
unremarkable.

Lungs/Pleura: Mild scarring within the right lung, likely related to
previous pneumonia. Lungs otherwise clear. No pleural effusion or
pneumothorax.

Upper Abdomen: No acute findings. Diverticulosis of the transverse
colon without evidence of acute diverticulitis.

Musculoskeletal: No acute or suspicious osseous finding. Superficial
soft tissues are unremarkable.

Review of the MIP images confirms the above findings.
IMPRESSION: 1. No thoracic aortic aneurysm.
2. No acute findings. No pneumonia or pleural effusion. Heart size
is normal.
3. Colonic diverticulosis without evidence of acute diverticulitis.

## 2018-05-23 ENCOUNTER — Encounter: Payer: Self-pay | Admitting: Internal Medicine

## 2018-05-23 ENCOUNTER — Ambulatory Visit (INDEPENDENT_AMBULATORY_CARE_PROVIDER_SITE_OTHER): Payer: Medicare Other | Admitting: Internal Medicine

## 2018-05-23 VITALS — BP 116/70 | HR 66 | Ht 74.0 in | Wt 218.4 lb

## 2018-05-23 DIAGNOSIS — I472 Ventricular tachycardia: Secondary | ICD-10-CM | POA: Diagnosis not present

## 2018-05-23 DIAGNOSIS — I519 Heart disease, unspecified: Secondary | ICD-10-CM

## 2018-05-23 DIAGNOSIS — I480 Paroxysmal atrial fibrillation: Secondary | ICD-10-CM

## 2018-05-23 DIAGNOSIS — Z95810 Presence of automatic (implantable) cardiac defibrillator: Secondary | ICD-10-CM | POA: Diagnosis not present

## 2018-05-23 DIAGNOSIS — I428 Other cardiomyopathies: Secondary | ICD-10-CM | POA: Diagnosis not present

## 2018-05-23 DIAGNOSIS — I4729 Other ventricular tachycardia: Secondary | ICD-10-CM

## 2018-05-23 DIAGNOSIS — I5022 Chronic systolic (congestive) heart failure: Secondary | ICD-10-CM | POA: Diagnosis not present

## 2018-05-23 LAB — CUP PACEART INCLINIC DEVICE CHECK
Brady Statistic RV Percent Paced: 18 %
Date Time Interrogation Session: 20200224182258
HighPow Impedance: 56 Ohm
Implantable Lead Implant Date: 20101123
Implantable Lead Implant Date: 20101123
Implantable Lead Location: 753859
Implantable Lead Location: 753860
Implantable Pulse Generator Implant Date: 20141203
Lead Channel Impedance Value: 450 Ohm
Lead Channel Impedance Value: 487.5 Ohm
Lead Channel Pacing Threshold Amplitude: 0.75 V
Lead Channel Pacing Threshold Pulse Width: 0.5 ms
Lead Channel Pacing Threshold Pulse Width: 0.5 ms
Lead Channel Sensing Intrinsic Amplitude: 2.6 mV
Lead Channel Sensing Intrinsic Amplitude: 8.5 mV
Lead Channel Setting Pacing Amplitude: 2.5 V
Lead Channel Setting Pacing Pulse Width: 0.5 ms
Lead Channel Setting Sensing Sensitivity: 0.5 mV
MDC IDC MSMT BATTERY REMAINING LONGEVITY: 36 mo
MDC IDC MSMT LEADCHNL RA PACING THRESHOLD AMPLITUDE: 1 V
MDC IDC SET LEADCHNL RV PACING AMPLITUDE: 2.5 V
MDC IDC STAT BRADY RA PERCENT PACED: 89 %
Pulse Gen Serial Number: 7136426

## 2018-05-23 NOTE — Progress Notes (Signed)
PCP: Jonathon Jordan, MD   Primary EP: Dr Rayann Heman  Robert Ochoa is a 73 y.o. male who presents today for routine electrophysiology followup.  Since last being seen in our clinic, the patient reports doing very well.  Today, he denies symptoms of palpitations, chest pain, shortness of breath,  lower extremity edema, dizziness, presyncope, syncope, or ICD shocks.  The patient is otherwise without complaint today.   Past Medical History:  Diagnosis Date  . Anxiety   . Arrhythmogenic right ventricular dysplasia (HCC)    Hx of VT s/p dual-chamber St. Jude ICD. Cardiac MRI 2010 showed moderately dilated RV with hypokinesis, LV EF 47%. EF 50-55% 2010 but down to 45% 05/2011. Cath 01/2009 without significant CAD.  [name] Atrial fibrillation or flutter    Paroxysmal. Started on Pradaxa 05/2011.  . Cardiac defibrillator in place   . LV dysfunction    per echo 10/2011; EF 35%  // Echo 2/18: EF 40-45, diff HK, Gr 1 DD, mild dilated aortic root, mod LAE, mod RVE, mild reduced RVSF, mod RAE  . Septic shock(785.52) 10-27-11   with associated respiratory/renal failure/PAF/bradycardia  . Ventricular tachycardia ([place])    ICD discharges 05/2011 for this & AF-RVR - started on amiodarone then.   Past Surgical History:  Procedure Laterality Date  . APPENDECTOMY    . CARDIAC DEFIBRILLATOR PLACEMENT  02/19/09    St,. Jude   . IMPLANTABLE CARDIOVERTER DEFIBRILLATOR (ICD) GENERATOR CHANGE N/A 03/01/2013   Procedure: ICD GENERATOR CHANGE;  Surgeon: [name], MD;  Location: [facility] CATH LAB;  Service: Cardiovascular;  Laterality: N/A;  . IMPLANTABLE CARDIOVERTER DEFIBRILLATOR GENERATOR CHANGE  03/01/13   for premature battery depletetion.  Now has a SJM Ellipse DR device placed by Dr Caryl Comes    ROS- all systems are reviewed and negative except as per HPI above  Current Outpatient Medications  Medication Sig Dispense Refill  . amiodarone (PACERONE) 200 MG tablet TAKE 1/2 TABLET(100 MG) BY MOUTH DAILY 45 tablet 11  .  Calcium-Magnesium-Zinc (CAL-MAG-ZINC PO) Take 2 tablets by mouth daily.     [name] ELIQUIS 5 MG TABS tablet TAKE 1 TABLET BY MOUTH TWICE DAILY 60 tablet 11  . lisinopril (PRINIVIL,ZESTRIL) 10 MG tablet TAKE 2 TABLETS BY MOUTH EVERY DAY 180 tablet 3  . metoprolol tartrate (LOPRESSOR) 25 MG tablet TAKE 3 TABLETS BY MOUTH TWICE DAILY 540 tablet 3  . Omega-3 Fatty Acids (FISH OIL) 1000 MG CAPS Take 3 capsules by mouth daily.     . sildenafil (VIAGRA) 100 MG tablet Take 1 tablet (100 mg total) by mouth daily as needed for erectile dysfunction. 10 tablet 0  . vitamin C (ASCORBIC ACID) 500 MG tablet Take 1,000 mg by mouth daily.      No current facility-administered medications for this visit.     Physical Exam: Vitals:   05/23/18 1606  BP: 116/70  Pulse: 66  SpO2: 97%  Weight: 218 lb 6.4 oz (99.1 kg)  Height: 6\' 2"  (1.88 m)    GEN- The patient is well appearing, alert and oriented x 3 today.   Head- normocephalic, atraumatic Eyes-  Sclera clear, conjunctiva pink Ears- hearing intact Oropharynx- clear Lungs- Clear to ausculation bilaterally, normal work of breathing Chest- ICD pocket is well healed Heart- Regular rate and rhythm, no murmurs, rubs or gallops, PMI not laterally displaced GI- soft, NT, ND, + BS Extremities- no clubbing, cyanosis, or edema  ICD interrogation- reviewed in detail today,  See PACEART report  ekg tracing ordered today  is personally reviewed and shows atrial paced, first degree AV block, PVCs, diffuse TWI  Wt Readings from Last 3 Encounters:  05/23/18 218 lb 6.4 oz (99.1 kg)  11/09/17 215 lb 12.8 oz (97.9 kg)  05/17/17 216 lb (98 kg)    Assessment and Plan:  1.  VT/ ARVD Well controlled with amiodarone 100mg  daily Check lfts, tfts today Normal ICD function See Pace Art report No changes today  2. afib Well controlled (burden < 1%) On eliquis  3. Chronic systolic dysfunction with biventricular failure Stable No change required today Declines ICM  clinic at this time  4. Aortic root enlargement Repeat echo No aneurysm by prior CT 2018  Merlin Return to see Brynda Rim in 6 months I will see in a year  Thompson Grayer MD, [facility] 05/23/2018 4:14 PM

## 2018-05-23 NOTE — Patient Instructions (Addendum)
Medication Instructions:  Your physician recommends that you continue on your current medications as directed. Please refer to the Current Medication list given to you today.  Labwork: You will get lab work today:  Liver panel and TSH  Testing/Procedures: Your physician has requested that you have an echocardiogram. Echocardiography is a painless test that uses sound waves to create images of your heart. It provides your doctor with information about the size and shape of your heart and how well your heart's chambers and valves are working. This procedure takes approximately one hour. There are no restrictions for this procedure.  Please schedule for ECHO  Follow-Up:  Your physician wants you to follow-up in: 6 months with Truitt Merle.   You will receive a reminder letter in the mail two months in advance. If you don't receive a letter, please call our office to schedule the follow-up appointment.  Your physician wants you to follow-up in: one year with Dr. Rayann Heman.   You will receive a reminder letter in the mail two months in advance. If you don't receive a letter, please call our office to schedule the follow-up appointment.  Remote monitoring is used to monitor your ICD from home. This monitoring reduces the number of office visits required to check your device to one time per year. It allows [place] to keep an eye on the functioning of your device to ensure it is working properly. You are scheduled for a device check from home on 06/13/2018. You may send your transmission at any time that day. If you have a wireless device, the transmission will be sent automatically. After your physician reviews your transmission, you will receive a postcard with your next transmission date.  Any Other Special Instructions Will Be Listed Below (If Applicable).  If you need a refill on your cardiac medications before your next appointment, please call your pharmacy.

## 2018-05-24 ENCOUNTER — Other Ambulatory Visit: Payer: Self-pay | Admitting: Internal Medicine

## 2018-05-24 LAB — HEPATIC FUNCTION PANEL
ALBUMIN: 4.5 g/dL (ref 3.7–4.7)
ALT: 22 IU/L (ref 0–44)
AST: 28 IU/L (ref 0–40)
Alkaline Phosphatase: 76 IU/L (ref 39–117)
Bilirubin Total: 0.4 mg/dL (ref 0.0–1.2)
Bilirubin, Direct: 0.13 mg/dL (ref 0.00–0.40)
Total Protein: 7.4 g/dL (ref 6.0–8.5)

## 2018-05-24 LAB — TSH: TSH: 1.1 u[IU]/mL (ref 0.450–4.500)

## 2018-06-01 ENCOUNTER — Ambulatory Visit (HOSPITAL_COMMUNITY): Payer: Medicare Other | Attending: Cardiology

## 2018-06-01 DIAGNOSIS — I428 Other cardiomyopathies: Secondary | ICD-10-CM | POA: Insufficient documentation

## 2018-06-01 DIAGNOSIS — I519 Heart disease, unspecified: Secondary | ICD-10-CM | POA: Insufficient documentation

## 2018-06-13 ENCOUNTER — Other Ambulatory Visit: Payer: Self-pay

## 2018-06-13 ENCOUNTER — Ambulatory Visit (INDEPENDENT_AMBULATORY_CARE_PROVIDER_SITE_OTHER): Payer: Medicare Other | Admitting: *Deleted

## 2018-06-13 DIAGNOSIS — I428 Other cardiomyopathies: Secondary | ICD-10-CM

## 2018-06-13 DIAGNOSIS — I472 Ventricular tachycardia: Secondary | ICD-10-CM | POA: Diagnosis not present

## 2018-06-13 DIAGNOSIS — I4729 Other ventricular tachycardia: Secondary | ICD-10-CM

## 2018-06-14 LAB — CUP PACEART REMOTE DEVICE CHECK
Battery Remaining Longevity: 34 mo
Battery Remaining Percentage: 41 %
Brady Statistic RA Percent Paced: 93 %
Brady Statistic RV Percent Paced: 14 %
Date Time Interrogation Session: 20200317174415
HighPow Impedance: 53 Ohm
Implantable Lead Implant Date: 20101123
Implantable Lead Implant Date: 20101123
Implantable Lead Location: 753860
Implantable Lead Model: 7121
Implantable Pulse Generator Implant Date: 20141203
Lead Channel Impedance Value: 450 Ohm
Lead Channel Impedance Value: 490 Ohm
Lead Channel Sensing Intrinsic Amplitude: 2.4 mV
Lead Channel Sensing Intrinsic Amplitude: 9 mV
Lead Channel Setting Pacing Amplitude: 2.5 V
Lead Channel Setting Pacing Pulse Width: 0.5 ms
Lead Channel Setting Sensing Sensitivity: 0.5 mV
MDC IDC LEAD LOCATION: 753859
MDC IDC PG SERIAL: 7136426
MDC IDC SET LEADCHNL RA PACING AMPLITUDE: 2.5 V

## 2018-06-16 ENCOUNTER — Other Ambulatory Visit: Payer: Self-pay | Admitting: Family Medicine

## 2018-06-16 DIAGNOSIS — R109 Unspecified abdominal pain: Secondary | ICD-10-CM

## 2018-06-17 ENCOUNTER — Ambulatory Visit
Admission: RE | Admit: 2018-06-17 | Discharge: 2018-06-17 | Disposition: A | Discharge status: Home / Self Care | Payer: Medicare Other | Source: Ambulatory Visit | Attending: Family Medicine | Admitting: Family Medicine

## 2018-06-17 DIAGNOSIS — K5730 Diverticulosis of large intestine without perforation or abscess without bleeding: Secondary | ICD-10-CM | POA: Diagnosis not present

## 2018-06-17 DIAGNOSIS — R109 Unspecified abdominal pain: Secondary | ICD-10-CM

## 2018-06-17 MED ORDER — IOPAMIDOL (ISOVUE-300) INJECTION 61%
100.0000 mL | Freq: Once | INTRAVENOUS | Status: AC | PRN
  Administered 2018-06-17: 100 mL via INTRAVENOUS

## 2018-06-21 ENCOUNTER — Encounter: Payer: Self-pay | Admitting: Cardiology

## 2018-06-21 NOTE — Progress Notes (Signed)
Remote ICD transmission.   

## 2018-06-22 ENCOUNTER — Other Ambulatory Visit: Payer: Self-pay | Admitting: Family Medicine

## 2018-06-22 DIAGNOSIS — K5792 Diverticulitis of intestine, part unspecified, without perforation or abscess without bleeding: Secondary | ICD-10-CM

## 2018-07-01 ENCOUNTER — Other Ambulatory Visit: Payer: Self-pay

## 2018-07-01 ENCOUNTER — Ambulatory Visit
Admission: RE | Admit: 2018-07-01 | Discharge: 2018-07-01 | Disposition: A | Discharge status: Home / Self Care | Payer: Medicare Other | Source: Ambulatory Visit | Attending: Family Medicine | Admitting: Family Medicine

## 2018-07-01 DIAGNOSIS — K5792 Diverticulitis of intestine, part unspecified, without perforation or abscess without bleeding: Secondary | ICD-10-CM | POA: Diagnosis not present

## 2018-07-01 MED ORDER — IOPAMIDOL (ISOVUE-300) INJECTION 61%
125.0000 mL | Freq: Once | INTRAVENOUS | Status: AC | PRN
  Administered 2018-07-01: 125 mL via INTRAVENOUS

## 2018-07-06 ENCOUNTER — Other Ambulatory Visit: Payer: Self-pay | Admitting: Internal Medicine

## 2018-08-08 DIAGNOSIS — K5792 Diverticulitis of intestine, part unspecified, without perforation or abscess without bleeding: Secondary | ICD-10-CM | POA: Diagnosis not present

## 2018-08-08 DIAGNOSIS — Z7901 Long term (current) use of anticoagulants: Secondary | ICD-10-CM | POA: Diagnosis not present

## 2018-09-05 DIAGNOSIS — F4323 Adjustment disorder with mixed anxiety and depressed mood: Secondary | ICD-10-CM | POA: Diagnosis not present

## 2018-09-12 ENCOUNTER — Ambulatory Visit (INDEPENDENT_AMBULATORY_CARE_PROVIDER_SITE_OTHER): Payer: Medicare Other | Admitting: *Deleted

## 2018-09-12 DIAGNOSIS — I472 Ventricular tachycardia: Secondary | ICD-10-CM | POA: Diagnosis not present

## 2018-09-12 DIAGNOSIS — I4729 Other ventricular tachycardia: Secondary | ICD-10-CM

## 2018-09-13 LAB — CUP PACEART REMOTE DEVICE CHECK
Battery Remaining Longevity: 31 mo
Battery Remaining Percentage: 37 %
Battery Voltage: 2.86 V
Brady Statistic AP VP Percent: 11 %
Brady Statistic AP VS Percent: 79 %
Brady Statistic AS VP Percent: 1 %
Brady Statistic AS VS Percent: 8.4 %
Brady Statistic RA Percent Paced: 83 %
Brady Statistic RV Percent Paced: 16 %
Date Time Interrogation Session: 20200615060016
HighPow Impedance: 51 Ohm
HighPow Impedance: 52 Ohm
Implantable Lead Implant Date: 20101123
Implantable Lead Implant Date: 20101123
Implantable Lead Location: 753859
Implantable Lead Location: 753860
Implantable Lead Model: 7121
Implantable Pulse Generator Implant Date: 20141203
Lead Channel Impedance Value: 430 Ohm
Lead Channel Impedance Value: 440 Ohm
Lead Channel Pacing Threshold Amplitude: 0.75 V
Lead Channel Pacing Threshold Amplitude: 1 V
Lead Channel Pacing Threshold Pulse Width: 0.5 ms
Lead Channel Pacing Threshold Pulse Width: 0.5 ms
Lead Channel Sensing Intrinsic Amplitude: 2.8 mV
Lead Channel Sensing Intrinsic Amplitude: 7.4 mV
Lead Channel Setting Pacing Amplitude: 2.5 V
Lead Channel Setting Pacing Amplitude: 2.5 V
Lead Channel Setting Pacing Pulse Width: 0.5 ms
Lead Channel Setting Sensing Sensitivity: 0.5 mV
Pulse Gen Serial Number: 7136426

## 2018-09-19 DIAGNOSIS — F4323 Adjustment disorder with mixed anxiety and depressed mood: Secondary | ICD-10-CM | POA: Diagnosis not present

## 2018-09-20 ENCOUNTER — Encounter: Payer: Self-pay | Admitting: Cardiology

## 2018-09-20 NOTE — Progress Notes (Signed)
Remote ICD transmission.   

## 2018-09-29 DIAGNOSIS — F4323 Adjustment disorder with mixed anxiety and depressed mood: Secondary | ICD-10-CM | POA: Diagnosis not present

## 2018-10-11 DIAGNOSIS — J029 Acute pharyngitis, unspecified: Secondary | ICD-10-CM | POA: Diagnosis not present

## 2018-10-11 DIAGNOSIS — Z03818 Encounter for observation for suspected exposure to other biological agents ruled out: Secondary | ICD-10-CM | POA: Diagnosis not present

## 2018-10-14 DIAGNOSIS — F4323 Adjustment disorder with mixed anxiety and depressed mood: Secondary | ICD-10-CM | POA: Diagnosis not present

## 2018-10-17 DIAGNOSIS — L821 Other seborrheic keratosis: Secondary | ICD-10-CM | POA: Diagnosis not present

## 2018-10-17 DIAGNOSIS — L570 Actinic keratosis: Secondary | ICD-10-CM | POA: Diagnosis not present

## 2018-10-17 DIAGNOSIS — L812 Freckles: Secondary | ICD-10-CM | POA: Diagnosis not present

## 2018-10-17 DIAGNOSIS — Z85828 Personal history of other malignant neoplasm of skin: Secondary | ICD-10-CM | POA: Diagnosis not present

## 2018-10-17 DIAGNOSIS — D692 Other nonthrombocytopenic purpura: Secondary | ICD-10-CM | POA: Diagnosis not present

## 2018-10-17 DIAGNOSIS — D171 Benign lipomatous neoplasm of skin and subcutaneous tissue of trunk: Secondary | ICD-10-CM | POA: Diagnosis not present

## 2018-10-26 DIAGNOSIS — F4323 Adjustment disorder with mixed anxiety and depressed mood: Secondary | ICD-10-CM | POA: Diagnosis not present

## 2018-11-01 DIAGNOSIS — F4323 Adjustment disorder with mixed anxiety and depressed mood: Secondary | ICD-10-CM | POA: Diagnosis not present

## 2018-11-08 DIAGNOSIS — F4323 Adjustment disorder with mixed anxiety and depressed mood: Secondary | ICD-10-CM | POA: Diagnosis not present

## 2018-11-09 ENCOUNTER — Ambulatory Visit: Payer: Medicare Other | Admitting: Nurse Practitioner

## 2018-11-14 ENCOUNTER — Other Ambulatory Visit: Payer: Self-pay | Admitting: Internal Medicine

## 2018-11-14 DIAGNOSIS — Z20828 Contact with and (suspected) exposure to other viral communicable diseases: Secondary | ICD-10-CM | POA: Diagnosis not present

## 2018-11-19 ENCOUNTER — Other Ambulatory Visit: Payer: Self-pay | Admitting: Internal Medicine

## 2018-11-19 NOTE — Progress Notes (Signed)
CARDIOLOGY OFFICE NOTE  Date:  11/22/2018    Robert Ochoa Date of Birth: [birth year] Medical Record [id number]  PCP:  London Pepper, MD  Cardiologist:  [facility]   Chief Complaint  Patient presents with   Follow-up    History of Present Illness: Robert Ochoa is a 73 y.o. male who presents today for a 6 month check.  Seen for Dr. Rayann Heman.   He has multiple medical issues which include past pneumonia with associated sepsis, renal failure and respiratory failure back in 2013. Has atrial fib - maintained in NSR on very low dose amiodarone. Has an ICD/pacemaker in place with a set lower rate of 60. Last EF was 35 to 40% back in October of 2013 and increased to 45 to 50% per echo from October of 2014. Normal EF by echo from 2016. He is managed medically. His other issues include right ventricular dysplasia, ICD implant, PAF and paroxysmal VT. He remains on chronic amiodarone and chronic Eliquis. Last PFT was January of 2017. No CAD per cath back in 2010.   I saw him back in [place] 2017- he was actively losing weight. He was doing great and happy with how he was feeling. We got his echo updated due to aortic root enlargement - this has showed that his EF haddropped back - he is not able to have an MRI due his device implant. He is on a good CHF regimen. His study was reviewed with Dr. Rayann Heman who wanted to keep him on his current regimen and follow him along. Patient was agreeable to continuing with his current management with plans to repeat echo 6 months later- and EF was 40 to 45% - Robert Ochoa has continued to do well.   He has been referred back for repeat genetic testing -   Dr. 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 This variant has been reported in three individuals with [facility] [name] et al., 2010, Haggerty et al., 2017) and by two other clinical diagnostic labs. This variant is located in a  well-established functional domain that also contains the highest number of mutations in DSG2. Additionally, computational algorithms suggest a likely pathogenic role for this variant. However, as very few individuals have been reported to harbor this variant and its role in gene function has not been elucidated the DSG2 p.Phe321Ile variant is classified as a variant of unknown clinical significance.   In light of the molecular evidence for this variant, it is not recommended to pursue genetic testing of the familial variant in Robert Ochoa at this time".   Last seen by Dr. Rayann Heman back in February of 2020- echo was updated - EF is 45 to 50%.   The patient does not have symptoms concerning for COVID-19 infection (fever, chills, cough, or new shortness of breath).   Comes in today. Here alone. He is doing well. Not working as much due to [place] 19. Feels great. Walking 4 [place] most days. Has changed some eating habits - intentionally lost weight. Says he is "working on me". No chest pain. Breathing is good - has not been back to see pulmonary. No palpitations. No shocks. He has no real complaint and feels like he is doing well.    Past Medical History:  Diagnosis Date   Anxiety    Arrhythmogenic right ventricular dysplasia ([place])    Hx of VT s/p dual-chamber St. Jude ICD. Cardiac MRI 2010 showed moderately  dilated RV with hypokinesis, LV EF 47%. EF 50-55% 2010 but down to 45% 05/2011. Cath 01/2009 without significant CAD.   Atrial fibrillation or flutter    Paroxysmal. Started on Pradaxa 05/2011.   Cardiac defibrillator in place    LV dysfunction    per echo 10/2011; EF 35%  // Echo 2/18: EF 40-45, diff HK, Gr 1 DD, mild dilated aortic root, mod LAE, mod RVE, mild reduced RVSF, mod RAE   Septic shock(785.52) 10-27-11   with associated respiratory/renal failure/PAF/bradycardia   Ventricular tachycardia ([place])    ICD discharges 05/2011 for this & AF-RVR - started on amiodarone then.     Past Surgical History:  Procedure Laterality Date   APPENDECTOMY     CARDIAC DEFIBRILLATOR PLACEMENT  02/19/09    St,. Jude    IMPLANTABLE CARDIOVERTER DEFIBRILLATOR (ICD) GENERATOR CHANGE N/A 03/01/2013   Procedure: ICD GENERATOR CHANGE;  Surgeon: [name], MD;  Location: [facility] CATH LAB;  Service: Cardiovascular;  Laterality: N/A;   IMPLANTABLE CARDIOVERTER DEFIBRILLATOR GENERATOR CHANGE  03/01/13   for premature battery depletetion.  Now has a SJM Ellipse DR device placed by Dr Caryl Comes     Medications: Current Meds  Medication Sig   amiodarone (PACERONE) 200 MG tablet TAKE 1/2 TABLET(100 MG) BY MOUTH DAILY   Calcium-Magnesium-Zinc (CAL-MAG-ZINC PO) Take 2 tablets by mouth daily.    ELIQUIS 5 MG TABS tablet TAKE 1 TABLET BY MOUTH TWICE DAILY   lisinopril (PRINIVIL,ZESTRIL) 10 MG tablet TAKE 2 TABLETS BY MOUTH EVERY DAY   metoprolol tartrate (LOPRESSOR) 25 MG tablet TAKE 3 TABLETS BY MOUTH TWICE DAILY   Omega-3 Fatty Acids (FISH OIL) 1000 MG CAPS Take 3 capsules by mouth daily.    sildenafil (VIAGRA) 100 MG tablet Take 1 tablet (100 mg total) by mouth daily as needed for erectile dysfunction.   vitamin C (ASCORBIC ACID) 500 MG tablet Take 1,000 mg by mouth daily.      Allergies: No Known Allergies  Social History: The patient  reports that he has never smoked. He has never used smokeless tobacco. He reports current alcohol use. He reports that he does not use drugs.   Family History: The patient's family history includes Allergies in his mother; Heart attack in his father.   Review of Systems: Please see the history of present illness.   All other systems are reviewed and negative.   Physical Exam: VS:  BP 130/80    Pulse 64    Ht 6\' 2"  (1.88 m)    Wt 197 lb 1.9 oz (89.4 kg)    SpO2 97%    BMI 25.31 kg/m  .  BMI Body mass index is 25.31 kg/m.  Wt Readings from Last 3 Encounters:  11/22/18 197 lb 1.9 oz (89.4 kg)  05/23/18 218 lb 6.4 oz (99.1 kg)   11/09/17 215 lb 12.8 oz (97.9 kg)    General: Pleasant. Well developed, well nourished and in no acute distress. His weight is down.  HEENT: Normal.  Neck: Supple, no JVD, carotid bruits, or masses noted.  Cardiac: Regular rate and rhythm. No murmurs, rubs, or gallops. No edema.  Respiratory:  Lungs are clear to auscultation bilaterally with normal work of breathing.  GI: Soft and nontender.  MS: No deformity or atrophy. Gait and ROM intact.  Skin: Warm and dry. Color is normal.  Neuro:  Strength and sensation are intact and no gross focal deficits noted.  Psych: Alert, appropriate and with normal affect.   LABORATORY DATA:  EKG:  EKG is ordered today. This demonstrates A pacing.  Lab Results  Component Value Date   WBC 6.3 11/09/2017   HGB 16.3 11/09/2017   HCT 50.1 11/09/2017   PLT 202 11/09/2017   GLUCOSE 92 11/09/2017   CHOL 167 11/09/2017   TRIG 106 11/09/2017   HDL 39 (L) 11/09/2017   LDLCALC 107 (H) 11/09/2017   ALT 22 05/23/2018   AST 28 05/23/2018   NA 138 11/09/2017   K 5.3 (H) 11/09/2017   CL 102 11/09/2017   CREATININE 1.20 11/09/2017   BUN 20 11/09/2017   CO2 20 11/09/2017   TSH 1.100 05/23/2018   INR 1.18 10/30/2011     BNP (last 3 results) No results for input(s): BNP in the last 8760 hours.  ProBNP (last 3 results) No results for input(s): PROBNP in the last 8760 hours.   Other Studies Reviewed Today:  ECHO IMPRESSIONS 05/2018   1. The left ventricle has mildly reduced systolic function, with an ejection fraction of 45-50%. The cavity size was normal. Left ventricular diastolic Doppler parameters are consistent with impaired relaxation Left ventricular diffuse hypokinesis.  2. The right ventricle has mildly reduced systolic function. The cavity was severely enlarged. There is no increase in right ventricular wall thickness.  3. Left atrial size was mildly dilated.  4. Right atrial size was severely dilated.  5. The mitral valve is normal in  structure.  6. The tricuspid valve is normal in structure.  7. The aortic valve is tricuspid.  8. The pulmonic valve was normal in structure.  9. There is mild dilatation of the aortic root measuring 39 mm.  Echo Study Conclusions from 04/2016  - Left ventricle: The cavity size was normal. Wall thickness was normal. Systolic function was mildly to moderately reduced. The estimated ejection fraction was in the range of 40% to 45%. Diffuse hypokinesis. Doppler parameters are consistent with abnormal left ventricular relaxation (grade 1 diastolic dysfunction). - Aortic root: The aortic root was mildly dilated. - Left atrium: The atrium was moderately dilated. - Right ventricle: The cavity size was moderately dilated. Systolic function was mildly reduced. - Right atrium: The atrium was moderately dilated.  Impressions:  - Mild to moderate global reduction in LV systolic function; grade 1 diastolic dysfunction; moderate biatrial enlargement; moderate RVE with mildly reduced function.   Echo Study Conclusions from 09/2015  - Left ventricle: The cavity size was mildly dilated. Wall thickness was normal. Systolic function was moderately to severely reduced. The estimated ejection fraction was in the range of 30% to 35%. Doppler parameters are consistent with abnormal left ventricular relaxation (grade 1 diastolic dysfunction). - Right ventricle: The cavity size was mildly dilated. Systolic function was moderately to severely reduced. - Right atrium: The atrium was mildly dilated.   Echo Study Conclusions from 09/2014  - Left ventricle: The cavity size was normal. Wall thickness was  increased in a pattern of mild LVH. Systolic function was normal.  The estimated ejection fraction was in the range of 50% to 55%.  Doppler parameters are consistent with abnormal left ventricular  relaxation (grade 1 diastolic dysfunction). The E/e&' ratio is  <8,  suggesting normal LV filling pressure. - Aorta: Aortic root dimension: 42 mm (ED). - Aortic root: The aortic root is dilated. - Left atrium: The atrium was normal in size. - Right atrium: The atrium was mildly dilated. - Tricuspid valve: There was mild regurgitation. - Pulmonary arteries: PA peak pressure: 33 mm Hg (S). - Inferior  vena cava: The vessel was normal in size. The  respirophasic diameter changes were in the normal range (>= 50%),  consistent with normal central venous pressure.  Impressions:  - LVEF 50-55%, incoordinate septal motion, AICD wires in place,  diastolic dysfunction, normal LV filling pressure, mildly dilated  aortic root to 4.2 cm, mildly dilated RA, mild TR, top normal  RVSP at 33 mmHg.   Echo Study Conclusions from 2014  - Left ventricle: The cavity size was normal. Wall thickness was normal. Systolic function was mildly reduced. The estimated ejection fraction was in the range of 45% to 50%. Diffuse hypokinesis. Doppler parameters are consistent with abnormal left ventricular relaxation (grade 1 diastolic dysfunction). - Left atrium: The atrium was mildly dilated. - Right ventricle: The cavity size was moderately to severely dilated. Systolic function was moderately reduced. - Right atrium: The atrium was moderately dilated. - Atrial septum: There was an atrial septal aneurysm. - Pulmonary arteries: PA peak pressure: 75mm Hg (S).  Assessment/Plan:  1. Chronic Systolic heart failure/nonischemic CM - last echo earlier this year with some improvement. He is NYHA I. Doing great.   2. ARVD - has his ICD in place.Has declined ICM follow up clinic. Followed by Dr. Rayann Heman - no shocks. Sees Dr. Rayann Heman in February 2021.   3. VT/PAF - on chronic amiodarone - needs labs today.   4. Chronic anticoagulation - on Eliquis - no problems noted - needs labs.   5. Enlarged aortic root -no aneurysm by CT from 2018. Not  discussed.   6. Low diffusion capacity on PFTs - followed by Dr. Elsworth Soho - no evidence of pulmonary HTN on echo -only way to clearly evaluate pulmonary hypertension would be a right heart cath.He has remained asymptomatic. He has not had follow up with pulmonary. We will get a CXR updated today.   7. HLD - on statin - needs labs today.   8. Intentional weight loss - endurance is good.    9. COVID-19 Education: The signs and symptoms of COVID-19 were discussed with the patient and how to seek care for testing (follow up with PCP or arrange E-visit).  The importance of social distancing, staying at home, hand hygiene and wearing a mask when out in public were discussed today.  Current medicines are reviewed with the patient today.  The patient does not have concerns regarding medicines other than what has been noted above.  The following changes have been made:  See above.  Labs/ tests ordered today include:    Orders Placed This Encounter  Procedures   DG Chest 2 View   Basic metabolic panel   CBC   Hepatic function panel   Lipid panel   TSH   EKG 12-Lead     Disposition:   FU with Dr. Rayann Heman in 6 months; me in one year.    Patient is agreeable to this plan and will call if any problems develop in the interim.   SignedTruitt Merle, NP  11/22/2018 11:16 AM  [facility] [address] [place], [place]  [address] Phone: [phone] Fax: [phone]

## 2018-11-21 NOTE — Telephone Encounter (Signed)
Eliquis 5mg  refill request received; pt is 73 yrs old, wt-99.1kg, Crea-1.25 via [place] on 06/16/2018, last seen by Dr. Rayann Heman on 05/23/2018, Diagnosis-Afib; will send in refill.

## 2018-11-22 ENCOUNTER — Ambulatory Visit
Admission: RE | Admit: 2018-11-22 | Discharge: 2018-11-22 | Disposition: A | Discharge status: Home / Self Care | Payer: Medicare Other | Source: Ambulatory Visit | Attending: Nurse Practitioner | Admitting: Nurse Practitioner

## 2018-11-22 ENCOUNTER — Encounter: Payer: Self-pay | Admitting: Nurse Practitioner

## 2018-11-22 ENCOUNTER — Other Ambulatory Visit: Payer: Self-pay

## 2018-11-22 ENCOUNTER — Ambulatory Visit (INDEPENDENT_AMBULATORY_CARE_PROVIDER_SITE_OTHER): Payer: Medicare Other | Admitting: Nurse Practitioner

## 2018-11-22 VITALS — BP 130/80 | HR 64 | Ht 74.0 in | Wt 197.1 lb

## 2018-11-22 DIAGNOSIS — Z79899 Other long term (current) drug therapy: Secondary | ICD-10-CM

## 2018-11-22 DIAGNOSIS — I5022 Chronic systolic (congestive) heart failure: Secondary | ICD-10-CM

## 2018-11-22 DIAGNOSIS — I519 Heart disease, unspecified: Secondary | ICD-10-CM | POA: Diagnosis not present

## 2018-11-22 DIAGNOSIS — I472 Ventricular tachycardia: Secondary | ICD-10-CM

## 2018-11-22 DIAGNOSIS — I480 Paroxysmal atrial fibrillation: Secondary | ICD-10-CM | POA: Diagnosis not present

## 2018-11-22 DIAGNOSIS — I428 Other cardiomyopathies: Secondary | ICD-10-CM

## 2018-11-22 DIAGNOSIS — Z7189 Other specified counseling: Secondary | ICD-10-CM

## 2018-11-22 DIAGNOSIS — E785 Hyperlipidemia, unspecified: Secondary | ICD-10-CM | POA: Diagnosis not present

## 2018-11-22 DIAGNOSIS — Z95810 Presence of automatic (implantable) cardiac defibrillator: Secondary | ICD-10-CM | POA: Diagnosis not present

## 2018-11-22 DIAGNOSIS — I4729 Other ventricular tachycardia: Secondary | ICD-10-CM

## 2018-11-22 LAB — CBC
Hematocrit: 47.3 % (ref 37.5–51.0)
Hemoglobin: 15.8 g/dL (ref 13.0–17.7)
MCH: 28.9 pg (ref 26.6–33.0)
MCHC: 33.4 g/dL (ref 31.5–35.7)
MCV: 87 fL (ref 79–97)
Platelets: 207 10*3/uL (ref 150–450)
RBC: 5.46 x10E6/uL (ref 4.14–5.80)
RDW: 13.1 % (ref 11.6–15.4)
WBC: 6.1 10*3/uL (ref 3.4–10.8)

## 2018-11-22 LAB — HEPATIC FUNCTION PANEL
ALT: 16 IU/L (ref 0–44)
AST: 21 IU/L (ref 0–40)
Albumin: 4.2 g/dL (ref 3.7–4.7)
Alkaline Phosphatase: 65 IU/L (ref 39–117)
Bilirubin Total: 0.9 mg/dL (ref 0.0–1.2)
Bilirubin, Direct: 0.25 mg/dL (ref 0.00–0.40)
Total Protein: 6.4 g/dL (ref 6.0–8.5)

## 2018-11-22 LAB — BASIC METABOLIC PANEL
BUN/Creatinine Ratio: 19 (ref 10–24)
BUN: 20 mg/dL (ref 8–27)
CO2: 19 mmol/L — ABNORMAL LOW (ref 20–29)
Calcium: 9.3 mg/dL (ref 8.6–10.2)
Chloride: 104 mmol/L (ref 96–106)
Creatinine, Ser: 1.03 mg/dL (ref 0.76–1.27)
GFR calc Af Amer: 83 mL/min/{1.73_m2} (ref >59)
GFR calc non Af Amer: 72 mL/min/{1.73_m2} (ref >59)
Glucose: 96 mg/dL (ref 65–99)
Potassium: 4.8 mmol/L (ref 3.5–5.2)
Sodium: 137 mmol/L (ref 134–144)

## 2018-11-22 LAB — LIPID PANEL
Chol/HDL Ratio: 3.4 ratio (ref 0.0–5.0)
Cholesterol, Total: 166 mg/dL (ref 100–199)
HDL: 49 mg/dL (ref >39)
LDL Calculated: 99 mg/dL (ref 0–99)
Triglycerides: 90 mg/dL (ref 0–149)
VLDL Cholesterol Cal: 18 mg/dL (ref 5–40)

## 2018-11-22 LAB — TSH: TSH: 0.822 u[IU]/mL (ref 0.450–4.500)

## 2018-11-22 NOTE — Patient Instructions (Addendum)
After Visit Summary:  We will be checking the following labs today - BMET, CBC, HPF, Lipids, TSH  Please go to [facility] to [place] on the first floor for a chest Xray - you may walk in.    Medication Instructions:    Continue with your current medicines.    If you need a refill on your cardiac medications before your next appointment, please call your pharmacy.     Testing/Procedures To Be Arranged:  N/A  Follow-Up:   See Dr. Rayann Heman in 6 months; see me in 1 year.     At [facility], you and your health needs are our priority.  As part of our continuing mission to provide you with exceptional heart care, we have created designated Provider Care Teams.  These Care Teams include your primary Cardiologist (physician) and Advanced Practice Providers (APPs -  Physician Assistants and Nurse Practitioners) who all work together to provide you with the care you need, when you need it.  Special Instructions:  . Stay safe, stay home, wash your hands for at least 20 seconds and wear a mask when out in public.  . It was good to talk with you today.  [name] Keep up the good work!   Call the [place] office at [phone] if you have any questions, problems or concerns.

## 2018-11-23 DIAGNOSIS — F4323 Adjustment disorder with mixed anxiety and depressed mood: Secondary | ICD-10-CM | POA: Diagnosis not present

## 2018-11-28 ENCOUNTER — Telehealth: Payer: Self-pay | Admitting: *Deleted

## 2018-11-28 NOTE — Telephone Encounter (Signed)
lvm for pt to come pick up eliquis samples that were left downstairs.

## 2018-11-29 DIAGNOSIS — F4323 Adjustment disorder with mixed anxiety and depressed mood: Secondary | ICD-10-CM | POA: Diagnosis not present

## 2018-12-08 DIAGNOSIS — F4323 Adjustment disorder with mixed anxiety and depressed mood: Secondary | ICD-10-CM | POA: Diagnosis not present

## 2018-12-12 ENCOUNTER — Ambulatory Visit (INDEPENDENT_AMBULATORY_CARE_PROVIDER_SITE_OTHER): Payer: Medicare Other | Admitting: *Deleted

## 2018-12-12 DIAGNOSIS — I472 Ventricular tachycardia: Secondary | ICD-10-CM

## 2018-12-12 DIAGNOSIS — I4729 Other ventricular tachycardia: Secondary | ICD-10-CM

## 2018-12-12 LAB — CUP PACEART REMOTE DEVICE CHECK
Battery Remaining Longevity: 30 mo
Battery Remaining Percentage: 36 %
Battery Voltage: 2.86 V
Brady Statistic AP VP Percent: 8.5 %
Brady Statistic AP VS Percent: 79 %
Brady Statistic AS VP Percent: 1 %
Brady Statistic AS VS Percent: 11 %
Brady Statistic RA Percent Paced: 82 %
Brady Statistic RV Percent Paced: 12 %
Date Time Interrogation Session: 20200914060022
HighPow Impedance: 48 Ohm
HighPow Impedance: 48 Ohm
Implantable Lead Implant Date: 20101123
Implantable Lead Implant Date: 20101123
Implantable Lead Location: 753859
Implantable Lead Location: 753860
Implantable Lead Model: 7121
Implantable Pulse Generator Implant Date: 20141203
Lead Channel Impedance Value: 360 Ohm
Lead Channel Impedance Value: 430 Ohm
Lead Channel Pacing Threshold Amplitude: 0.75 V
Lead Channel Pacing Threshold Amplitude: 1 V
Lead Channel Pacing Threshold Pulse Width: 0.5 ms
Lead Channel Pacing Threshold Pulse Width: 0.5 ms
Lead Channel Sensing Intrinsic Amplitude: 2 mV
Lead Channel Sensing Intrinsic Amplitude: 7.6 mV
Lead Channel Setting Pacing Amplitude: 2.5 V
Lead Channel Setting Pacing Amplitude: 2.5 V
Lead Channel Setting Pacing Pulse Width: 0.5 ms
Lead Channel Setting Sensing Sensitivity: 0.5 mV
Pulse Gen Serial Number: 7136426

## 2018-12-13 DIAGNOSIS — F4323 Adjustment disorder with mixed anxiety and depressed mood: Secondary | ICD-10-CM | POA: Diagnosis not present

## 2018-12-20 DIAGNOSIS — F4323 Adjustment disorder with mixed anxiety and depressed mood: Secondary | ICD-10-CM | POA: Diagnosis not present

## 2018-12-23 ENCOUNTER — Encounter: Payer: Self-pay | Admitting: Cardiology

## 2018-12-23 NOTE — Progress Notes (Signed)
Remote ICD transmission.   

## 2018-12-30 DIAGNOSIS — F4323 Adjustment disorder with mixed anxiety and depressed mood: Secondary | ICD-10-CM | POA: Diagnosis not present

## 2019-01-05 DIAGNOSIS — F4323 Adjustment disorder with mixed anxiety and depressed mood: Secondary | ICD-10-CM | POA: Diagnosis not present

## 2019-01-10 DIAGNOSIS — F4323 Adjustment disorder with mixed anxiety and depressed mood: Secondary | ICD-10-CM | POA: Diagnosis not present

## 2019-01-20 DIAGNOSIS — Z23 Encounter for immunization: Secondary | ICD-10-CM | POA: Diagnosis not present

## 2019-01-24 ENCOUNTER — Telehealth: Payer: Self-pay | Admitting: *Deleted

## 2019-01-24 DIAGNOSIS — F4323 Adjustment disorder with mixed anxiety and depressed mood: Secondary | ICD-10-CM | POA: Diagnosis not present

## 2019-01-24 NOTE — Telephone Encounter (Signed)
   [facility] Pre-operative Risk Assessment    Request for surgical clearance:  1. What type of surgery is being performed? COLONOSCOPY   2. When is this surgery scheduled? 03/14/19   3. What type of clearance is required (medical clearance vs. Pharmacy clearance to hold med vs. Both)? BOTH  4. Are there any medications that need to be held prior to surgery and how long? ELIQUIS   5. Practice name and name of physician performing surgery? [facility]; DR. Paulita Fujita   6. What is your office phone number [phone]    7.   What is your office fax number [phone]  8.   Anesthesia type (None, local, MAC, general) ? NOT LISTED; PROPOFOL?   Julaine Hua 01/24/2019, 9:26 AM  _________________________________________________________________   (provider comments below)

## 2019-01-24 NOTE — Telephone Encounter (Signed)
   Primary Cardiologist: Denice Bors / Dr. Rayann Heman  Chart reviewed as part of pre-operative protocol coverage. Patient was contacted 01/24/2019 in reference to pre-operative risk assessment for pending surgery as outlined below.  Deontay Deignan was last seen on 11/22/2018 by Truitt Merle.  Since that day, Bakr Goldie has done well without chest pain or shortness of breath. He is able to exercise for 4 [place] a day without exertional symptom.  Therefore, based on ACC/AHA guidelines, the patient would be at acceptable risk for the planned procedure without further cardiovascular testing.   I will route this recommendation to the requesting party via Epic fax function and remove from pre-op pool.  Please call with questions. Per our clinical pharmacist, he may hold eliquis for 1-2 days prior to the procedure and restart as soon as possible after the procedure at the discretion of Dr. Paulita Fujita.   [place], [place] 01/24/2019, 12:07 PM

## 2019-01-24 NOTE — Telephone Encounter (Signed)
Patient with diagnosis of afib on Eliquis for anticoagulation.    Procedure:  COLONOSCOPY  Date of procedure: 03/14/2019  CHADS2-VASc score of  2 (CHF,AGE)  CrCl 79 ml/min  Per office protocol, patient can hold Eliquis for 1-2 days prior to procedure.

## 2019-01-31 DIAGNOSIS — F4323 Adjustment disorder with mixed anxiety and depressed mood: Secondary | ICD-10-CM | POA: Diagnosis not present

## 2019-02-15 DIAGNOSIS — F4323 Adjustment disorder with mixed anxiety and depressed mood: Secondary | ICD-10-CM | POA: Diagnosis not present

## 2019-02-28 DIAGNOSIS — F4323 Adjustment disorder with mixed anxiety and depressed mood: Secondary | ICD-10-CM | POA: Diagnosis not present

## 2019-03-07 DIAGNOSIS — F4323 Adjustment disorder with mixed anxiety and depressed mood: Secondary | ICD-10-CM | POA: Diagnosis not present

## 2019-03-09 DIAGNOSIS — Z1159 Encounter for screening for other viral diseases: Secondary | ICD-10-CM | POA: Diagnosis not present

## 2019-03-13 ENCOUNTER — Ambulatory Visit (INDEPENDENT_AMBULATORY_CARE_PROVIDER_SITE_OTHER): Payer: Medicare Other | Admitting: *Deleted

## 2019-03-13 DIAGNOSIS — Q208 Other congenital malformations of cardiac chambers and connections: Secondary | ICD-10-CM

## 2019-03-13 LAB — CUP PACEART REMOTE DEVICE CHECK
Battery Remaining Longevity: 26 mo
Battery Remaining Percentage: 32 %
Battery Voltage: 2.84 V
Brady Statistic AP VP Percent: 8.7 %
Brady Statistic AP VS Percent: 80 %
Brady Statistic AS VP Percent: 1 %
Brady Statistic AS VS Percent: 9.8 %
Brady Statistic RA Percent Paced: 85 %
Brady Statistic RV Percent Paced: 11 %
Date Time Interrogation Session: 20201214020025
HighPow Impedance: 48 Ohm
HighPow Impedance: 48 Ohm
Implantable Lead Implant Date: 20101123
Implantable Lead Implant Date: 20101123
Implantable Lead Location: 753859
Implantable Lead Location: 753860
Implantable Lead Model: 7121
Implantable Pulse Generator Implant Date: 20141203
Lead Channel Impedance Value: 360 Ohm
Lead Channel Impedance Value: 430 Ohm
Lead Channel Pacing Threshold Amplitude: 0.75 V
Lead Channel Pacing Threshold Amplitude: 1 V
Lead Channel Pacing Threshold Pulse Width: 0.5 ms
Lead Channel Pacing Threshold Pulse Width: 0.5 ms
Lead Channel Sensing Intrinsic Amplitude: 3.2 mV
Lead Channel Sensing Intrinsic Amplitude: 7.7 mV
Lead Channel Setting Pacing Amplitude: 2.5 V
Lead Channel Setting Pacing Amplitude: 2.5 V
Lead Channel Setting Pacing Pulse Width: 0.5 ms
Lead Channel Setting Sensing Sensitivity: 0.5 mV
Pulse Gen Serial Number: 7136426

## 2019-03-22 NOTE — Telephone Encounter (Signed)
error 

## 2019-04-11 DIAGNOSIS — F4323 Adjustment disorder with mixed anxiety and depressed mood: Secondary | ICD-10-CM | POA: Diagnosis not present

## 2019-04-14 ENCOUNTER — Other Ambulatory Visit: Payer: Self-pay | Admitting: Internal Medicine

## 2019-04-15 NOTE — Progress Notes (Signed)
ICD remote 

## 2019-04-18 DIAGNOSIS — F4323 Adjustment disorder with mixed anxiety and depressed mood: Secondary | ICD-10-CM | POA: Diagnosis not present

## 2019-04-21 ENCOUNTER — Ambulatory Visit: Payer: Medicare Other | Attending: Internal Medicine

## 2019-04-21 DIAGNOSIS — Z23 Encounter for immunization: Secondary | ICD-10-CM | POA: Insufficient documentation

## 2019-04-21 NOTE — Progress Notes (Signed)
   Covid-19 Vaccination Clinic  Name:  Robert Ochoa    MRN: [id number] DOB: Apr 14, 1945  04/21/2019  [name] was observed post Covid-19 immunization for 30 minutes based on pre-vaccination screening without incidence. He was provided with Vaccine Information Sheet and instruction to access the V-Safe system.   [name] was instructed to call 911 with any severe reactions post vaccine: [name] Difficulty breathing  . Swelling of your face and throat  . A fast heartbeat  . A bad rash all over your body  . Dizziness and weakness    Immunizations Administered    Name Date Dose VIS Date Route   Pfizer COVID-19 Vaccine 04/21/2019  1:46 PM 0.3 mL 03/10/2019 Intramuscular   Manufacturer: [place]   Lot: [id number]   [place]: [id number]

## 2019-04-25 DIAGNOSIS — F4323 Adjustment disorder with mixed anxiety and depressed mood: Secondary | ICD-10-CM | POA: Diagnosis not present

## 2019-05-09 DIAGNOSIS — F4323 Adjustment disorder with mixed anxiety and depressed mood: Secondary | ICD-10-CM | POA: Diagnosis not present

## 2019-05-11 ENCOUNTER — Ambulatory Visit: Payer: Medicare Other | Attending: Internal Medicine

## 2019-05-11 DIAGNOSIS — Z23 Encounter for immunization: Secondary | ICD-10-CM | POA: Insufficient documentation

## 2019-05-11 NOTE — Progress Notes (Signed)
   Covid-19 Vaccination Clinic  Name:  Anhad Bareford    MRN: [id number] DOB: [birth year]  05/11/2019  Mr. Youngkin was observed post Covid-19 immunization for 30 minutes based on pre-vaccination screening without incidence. He was provided with Vaccine Information Sheet and instruction to access the V-Safe system.   Mr. Marchewka was instructed to call 911 with any severe reactions post vaccine: [name] Difficulty breathing  . Swelling of your face and throat  . A fast heartbeat  . A bad rash all over your body  . Dizziness and weakness    Immunizations Administered    Name Date Dose VIS Date Route   Pfizer COVID-19 Vaccine 05/11/2019  1:10 PM 0.3 mL 03/10/2019 Intramuscular   Manufacturer: [place]   Lot: [id number]   [place]: [id number]

## 2019-05-16 DIAGNOSIS — F4323 Adjustment disorder with mixed anxiety and depressed mood: Secondary | ICD-10-CM | POA: Diagnosis not present

## 2019-05-19 ENCOUNTER — Other Ambulatory Visit: Payer: Self-pay | Admitting: Internal Medicine

## 2019-05-22 ENCOUNTER — Telehealth: Payer: Self-pay

## 2019-05-22 ENCOUNTER — Encounter: Payer: Self-pay | Admitting: Internal Medicine

## 2019-05-22 ENCOUNTER — Other Ambulatory Visit: Payer: Self-pay

## 2019-05-22 ENCOUNTER — Telehealth (INDEPENDENT_AMBULATORY_CARE_PROVIDER_SITE_OTHER): Payer: Medicare Other | Admitting: Internal Medicine

## 2019-05-22 VITALS — Ht 74.0 in | Wt 197.0 lb

## 2019-05-22 DIAGNOSIS — I480 Paroxysmal atrial fibrillation: Secondary | ICD-10-CM

## 2019-05-22 DIAGNOSIS — I472 Ventricular tachycardia: Secondary | ICD-10-CM | POA: Diagnosis not present

## 2019-05-22 DIAGNOSIS — Q208 Other congenital malformations of cardiac chambers and connections: Secondary | ICD-10-CM | POA: Diagnosis not present

## 2019-05-22 DIAGNOSIS — D6869 Other thrombophilia: Secondary | ICD-10-CM

## 2019-05-22 DIAGNOSIS — E785 Hyperlipidemia, unspecified: Secondary | ICD-10-CM

## 2019-05-22 DIAGNOSIS — I428 Other cardiomyopathies: Secondary | ICD-10-CM

## 2019-05-22 DIAGNOSIS — I4729 Other ventricular tachycardia: Secondary | ICD-10-CM

## 2019-05-22 DIAGNOSIS — Z79899 Other long term (current) drug therapy: Secondary | ICD-10-CM

## 2019-05-22 NOTE — Telephone Encounter (Signed)
-----   Message from Thompson Grayer, MD sent at 05/22/2019  9:51 AM EST ----- Order bmet, lfts, TSH Order PFTs with spirometry for amiodarone screen Order echo to evaluate his cardiomyopathy. Maybe labs on the same day.  Follow-up with Truitt Merle in August I will see in a year

## 2019-05-22 NOTE — Telephone Encounter (Signed)
Order placed for labs, pft's and echo.  Will continue to follow and schedule labs on same day as echo.  Mychart message sent to Pt.

## 2019-05-22 NOTE — Progress Notes (Signed)
Electrophysiology TeleHealth Note   Due to national recommendations of social distancing due to COVID 19, an audio/video telehealth visit is felt to be most appropriate for this patient at this time.  See MyChart message from today for the patient's consent to telehealth for [facility].  Date:  05/22/2019   ID:  Anik Kowalik, DOB 07-Jan-1946, MRN [id number]  Location: patient's home  Provider location:  [facility]  Evaluation Performed: Follow-up visit  PCP:  London Pepper, MD   Electrophysiologist:  Dr Rayann Heman  Chief Complaint:  SOB  History of Present Illness:    Sahaj Raj is a 74 y.o. male who presents via telehealth conferencing today.  Since last being seen in our clinic, the patient reports doing very well.  He is walking regularly without limitation.  Today, he denies symptoms of palpitations, chest pain, shortness of breath,  lower extremity edema, dizziness, presyncope, or syncope.  The patient is otherwise without complaint today.  The patient denies symptoms of fevers, chills, cough, or new SOB worrisome for COVID 19.  Past Medical History:  Diagnosis Date  . Anxiety   . Arrhythmogenic right ventricular dysplasia (HCC)    Hx of VT s/p dual-chamber St. Jude ICD. Cardiac MRI 2010 showed moderately dilated RV with hypokinesis, LV EF 47%. EF 50-55% 2010 but down to 45% 05/2011. Cath 01/2009 without significant CAD.  [name] Atrial fibrillation or flutter    Paroxysmal. Started on Pradaxa 05/2011.  . Cardiac defibrillator in place   . LV dysfunction    per echo 10/2011; EF 35%  // Echo 2/18: EF 40-45, diff HK, Gr 1 DD, mild dilated aortic root, mod LAE, mod RVE, mild reduced RVSF, mod RAE  . Septic shock(785.52) 10-27-11   with associated respiratory/renal failure/PAF/bradycardia  . Ventricular tachycardia ([place])    ICD discharges 05/2011 for this & AF-RVR - started on amiodarone then.    Past Surgical History:  Procedure Laterality Date  . APPENDECTOMY    . CARDIAC  DEFIBRILLATOR PLACEMENT  02/19/09    St,. Jude   . IMPLANTABLE CARDIOVERTER DEFIBRILLATOR (ICD) GENERATOR CHANGE N/A 03/01/2013   Procedure: ICD GENERATOR CHANGE;  Surgeon: [name], MD;  Location: [facility] CATH LAB;  Service: Cardiovascular;  Laterality: N/A;  . IMPLANTABLE CARDIOVERTER DEFIBRILLATOR GENERATOR CHANGE  03/01/13   for premature battery depletetion.  Now has a SJM Ellipse DR device placed by Dr Caryl Comes    Current Outpatient Medications  Medication Sig Dispense Refill  . amiodarone (PACERONE) 200 MG tablet TAKE 1/2 TABLET(100 MG) BY MOUTH DAILY 45 tablet 11  . Calcium-Magnesium-Zinc (CAL-MAG-ZINC PO) Take 2 tablets by mouth daily.     [name] ELIQUIS 5 MG TABS tablet TAKE 1 TABLET BY MOUTH TWICE DAILY 60 tablet 6  . lisinopril (PRINIVIL,ZESTRIL) 10 MG tablet TAKE 2 TABLETS BY MOUTH EVERY DAY 180 tablet 3  . metoprolol tartrate (LOPRESSOR) 25 MG tablet TAKE 3 TABLETS BY MOUTH TWICE DAILY 540 tablet 1  . Omega-3 Fatty Acids (FISH OIL) 1000 MG CAPS Take 3 capsules by mouth daily.     . sildenafil (VIAGRA) 100 MG tablet Take 1 tablet (100 mg total) by mouth daily as needed for erectile dysfunction. 10 tablet 0  . vitamin C (ASCORBIC ACID) 500 MG tablet Take 1,000 mg by mouth daily.      No current facility-administered medications for this visit.    Allergies:   Patient has no known allergies.   Social History:  The patient  reports that he has  never smoked. He has never used smokeless tobacco. He reports current alcohol use. He reports that he does not use drugs.   Family History:  The patient's family history includes Allergies in his mother; Heart attack in his father.   ROS:  Please see the history of present illness.   All other systems are personally reviewed and negative.    Exam:    Vital Signs:  Ht 6\' 2"  (1.88 m)   Wt 197 lb (89.4 kg)   BMI 25.29 kg/m   Well sounding and appearing, alert and conversant, regular work of breathing,  good skin color Eyes- anicteric,  neuro- grossly intact, skin- no apparent rash or lesions or cyanosis, mouth- oral mucosa is pink  Labs/Other Tests and Data Reviewed:    Recent Labs: 11/22/2018: ALT 16; BUN 20; Creatinine, Ser 1.03; Hemoglobin 15.8; Platelets 207; Potassium 4.8; Sodium 137; TSH 0.822   Wt Readings from Last 3 Encounters:  05/22/19 197 lb (89.4 kg)  11/22/18 197 lb 1.9 oz (89.4 kg)  05/23/18 218 lb 6.4 oz (99.1 kg)     Last device remote is reviewed from [place] PDF which reveals normal device function, no arrhythmias    ASSESSMENT & PLAN:    1.  VT Well controlled with amiodarone 100mg  daily we discussed the importance for regular monitoring of PFTs, TFTs and LFTs to prevent toxicity.  Order lfts, tfts and pfts today. CXR 11/22/18 is reviewed and normal Normal ICD function  2. ARVD Stable No change required today Repeat echo at this time He is on beta blocker and ACE I.  Could consider entresto depending on echo results. No CHF symptoms  3. Paroxysmal atrial fibrillation Well controlled (burden is 2.8 % by remotes) On eliquis for chads2vasc score of at least  He is on eliquis for chadsvasc score of at least 2.  The score will increase to 3 at his next birthday.  4. Chronic lung disease Followed by Dr Elsworth Soho pfts ordered  5. HL Continue statin   Follow-up:  Truitt Merle in August I will see in a year   Patient Risk:  after full review of this patients clinical status, I feel that they are at high risk at this time.  Today, I have spent 20  minutes with the patient with telehealth technology discussing arrhythmia management .    Army Fossa, MD  05/22/2019 9:46 AM     [facility] [address] [place] [address] [phone] (office) [phone] (fax)

## 2019-05-23 DIAGNOSIS — F4323 Adjustment disorder with mixed anxiety and depressed mood: Secondary | ICD-10-CM | POA: Diagnosis not present

## 2019-05-30 DIAGNOSIS — F4323 Adjustment disorder with mixed anxiety and depressed mood: Secondary | ICD-10-CM | POA: Diagnosis not present

## 2019-06-07 ENCOUNTER — Other Ambulatory Visit: Payer: Self-pay

## 2019-06-07 ENCOUNTER — Ambulatory Visit (HOSPITAL_COMMUNITY): Payer: Medicare Other | Attending: Cardiovascular Disease

## 2019-06-07 ENCOUNTER — Other Ambulatory Visit: Payer: Medicare Other | Admitting: *Deleted

## 2019-06-07 DIAGNOSIS — E785 Hyperlipidemia, unspecified: Secondary | ICD-10-CM | POA: Diagnosis not present

## 2019-06-07 DIAGNOSIS — I480 Paroxysmal atrial fibrillation: Secondary | ICD-10-CM

## 2019-06-07 DIAGNOSIS — Z79899 Other long term (current) drug therapy: Secondary | ICD-10-CM | POA: Diagnosis not present

## 2019-06-07 DIAGNOSIS — I472 Ventricular tachycardia: Secondary | ICD-10-CM | POA: Insufficient documentation

## 2019-06-07 DIAGNOSIS — I428 Other cardiomyopathies: Secondary | ICD-10-CM | POA: Diagnosis not present

## 2019-06-07 LAB — BASIC METABOLIC PANEL
BUN/Creatinine Ratio: 13 (ref 10–24)
BUN: 15 mg/dL (ref 8–27)
CO2: 19 mmol/L — ABNORMAL LOW (ref 20–29)
Calcium: 8.8 mg/dL (ref 8.6–10.2)
Chloride: 103 mmol/L (ref 96–106)
Creatinine, Ser: 1.15 mg/dL (ref 0.76–1.27)
GFR calc Af Amer: 72 mL/min/{1.73_m2} (ref >59)
GFR calc non Af Amer: 62 mL/min/{1.73_m2} (ref >59)
Glucose: 89 mg/dL (ref 65–99)
Potassium: 4.7 mmol/L (ref 3.5–5.2)
Sodium: 137 mmol/L (ref 134–144)

## 2019-06-07 LAB — HEPATIC FUNCTION PANEL
ALT: 26 IU/L (ref 0–44)
AST: 35 IU/L (ref 0–40)
Albumin: 4.1 g/dL (ref 3.7–4.7)
Alkaline Phosphatase: 74 IU/L (ref 39–117)
Bilirubin Total: 0.9 mg/dL (ref 0.0–1.2)
Bilirubin, Direct: 0.22 mg/dL (ref 0.00–0.40)
Total Protein: 6.4 g/dL (ref 6.0–8.5)

## 2019-06-07 LAB — TSH: TSH: 1.06 u[IU]/mL (ref 0.450–4.500)

## 2019-06-12 ENCOUNTER — Ambulatory Visit (INDEPENDENT_AMBULATORY_CARE_PROVIDER_SITE_OTHER): Payer: Medicare Other | Admitting: *Deleted

## 2019-06-12 DIAGNOSIS — Q208 Other congenital malformations of cardiac chambers and connections: Secondary | ICD-10-CM | POA: Diagnosis not present

## 2019-06-13 LAB — CUP PACEART REMOTE DEVICE CHECK
Battery Remaining Longevity: 23 mo
Battery Remaining Percentage: 27 %
Battery Voltage: 2.81 V
Brady Statistic AP VP Percent: 9.4 %
Brady Statistic AP VS Percent: 81 %
Brady Statistic AS VP Percent: 1 %
Brady Statistic AS VS Percent: 8.4 %
Brady Statistic RA Percent Paced: 87 %
Brady Statistic RV Percent Paced: 11 %
Date Time Interrogation Session: 20210315030016
HighPow Impedance: 50 Ohm
HighPow Impedance: 50 Ohm
Implantable Lead Implant Date: 20101123
Implantable Lead Implant Date: 20101123
Implantable Lead Location: 753859
Implantable Lead Location: 753860
Implantable Lead Model: 7121
Implantable Pulse Generator Implant Date: 20141203
Lead Channel Impedance Value: 400 Ohm
Lead Channel Impedance Value: 430 Ohm
Lead Channel Pacing Threshold Amplitude: 0.75 V
Lead Channel Pacing Threshold Amplitude: 1 V
Lead Channel Pacing Threshold Pulse Width: 0.5 ms
Lead Channel Pacing Threshold Pulse Width: 0.5 ms
Lead Channel Sensing Intrinsic Amplitude: 1.8 mV
Lead Channel Sensing Intrinsic Amplitude: 8.4 mV
Lead Channel Setting Pacing Amplitude: 2.5 V
Lead Channel Setting Pacing Amplitude: 2.5 V
Lead Channel Setting Pacing Pulse Width: 0.5 ms
Lead Channel Setting Sensing Sensitivity: 0.5 mV
Pulse Gen Serial Number: 7136426

## 2019-06-13 NOTE — Progress Notes (Signed)
ICD Remote  

## 2019-06-20 DIAGNOSIS — F4323 Adjustment disorder with mixed anxiety and depressed mood: Secondary | ICD-10-CM | POA: Diagnosis not present

## 2019-06-27 DIAGNOSIS — F4323 Adjustment disorder with mixed anxiety and depressed mood: Secondary | ICD-10-CM | POA: Diagnosis not present

## 2019-07-01 ENCOUNTER — Other Ambulatory Visit: Payer: Self-pay | Admitting: Internal Medicine

## 2019-07-04 NOTE — Telephone Encounter (Signed)
Eliquis 5mg  refill request received. Patient is 74 years old, weight-89.4kg, Crea-1.15 on 3/10/20201, Diagnosis-Afib, and last seen by Dr. Rayann Heman via Telemedicine on 05/22/2019. Dose is appropriate based on dosing criteria. Will send in refill to requested pharmacy.

## 2019-07-11 DIAGNOSIS — F4323 Adjustment disorder with mixed anxiety and depressed mood: Secondary | ICD-10-CM | POA: Diagnosis not present

## 2019-07-18 DIAGNOSIS — F4323 Adjustment disorder with mixed anxiety and depressed mood: Secondary | ICD-10-CM | POA: Diagnosis not present

## 2019-07-25 DIAGNOSIS — F4323 Adjustment disorder with mixed anxiety and depressed mood: Secondary | ICD-10-CM | POA: Diagnosis not present

## 2019-08-01 DIAGNOSIS — F4323 Adjustment disorder with mixed anxiety and depressed mood: Secondary | ICD-10-CM | POA: Diagnosis not present

## 2019-08-08 DIAGNOSIS — F4323 Adjustment disorder with mixed anxiety and depressed mood: Secondary | ICD-10-CM | POA: Diagnosis not present

## 2019-08-15 DIAGNOSIS — F4323 Adjustment disorder with mixed anxiety and depressed mood: Secondary | ICD-10-CM | POA: Diagnosis not present

## 2019-08-22 DIAGNOSIS — F4323 Adjustment disorder with mixed anxiety and depressed mood: Secondary | ICD-10-CM | POA: Diagnosis not present

## 2019-08-30 DIAGNOSIS — F4323 Adjustment disorder with mixed anxiety and depressed mood: Secondary | ICD-10-CM | POA: Diagnosis not present

## 2019-09-05 DIAGNOSIS — F4323 Adjustment disorder with mixed anxiety and depressed mood: Secondary | ICD-10-CM | POA: Diagnosis not present

## 2019-09-11 ENCOUNTER — Ambulatory Visit (INDEPENDENT_AMBULATORY_CARE_PROVIDER_SITE_OTHER): Payer: Medicare Other | Admitting: *Deleted

## 2019-09-11 DIAGNOSIS — I4729 Other ventricular tachycardia: Secondary | ICD-10-CM

## 2019-09-11 DIAGNOSIS — I472 Ventricular tachycardia: Secondary | ICD-10-CM

## 2019-09-12 LAB — CUP PACEART REMOTE DEVICE CHECK
Battery Remaining Longevity: 22 mo
Battery Remaining Percentage: 26 %
Battery Voltage: 2.81 V
Brady Statistic AP VP Percent: 9.9 %
Brady Statistic AP VS Percent: 82 %
Brady Statistic AS VP Percent: 1 %
Brady Statistic AS VS Percent: 7.2 %
Brady Statistic RA Percent Paced: 88 %
Brady Statistic RV Percent Paced: 11 %
Date Time Interrogation Session: 20210614020018
HighPow Impedance: 53 Ohm
HighPow Impedance: 53 Ohm
Implantable Lead Implant Date: 20101123
Implantable Lead Implant Date: 20101123
Implantable Lead Location: 753859
Implantable Lead Location: 753860
Implantable Lead Model: 7121
Implantable Pulse Generator Implant Date: 20141203
Lead Channel Impedance Value: 430 Ohm
Lead Channel Impedance Value: 440 Ohm
Lead Channel Pacing Threshold Amplitude: 0.75 V
Lead Channel Pacing Threshold Amplitude: 1 V
Lead Channel Pacing Threshold Pulse Width: 0.5 ms
Lead Channel Pacing Threshold Pulse Width: 0.5 ms
Lead Channel Sensing Intrinsic Amplitude: 2.8 mV
Lead Channel Sensing Intrinsic Amplitude: 9.2 mV
Lead Channel Setting Pacing Amplitude: 2.5 V
Lead Channel Setting Pacing Amplitude: 2.5 V
Lead Channel Setting Pacing Pulse Width: 0.5 ms
Lead Channel Setting Sensing Sensitivity: 0.5 mV
Pulse Gen Serial Number: 7136426

## 2019-09-13 NOTE — Progress Notes (Signed)
Remote ICD transmission.   

## 2019-09-22 DIAGNOSIS — R972 Elevated prostate specific antigen [PSA]: Secondary | ICD-10-CM | POA: Diagnosis not present

## 2019-09-22 DIAGNOSIS — N5239 Other post-surgical erectile dysfunction: Secondary | ICD-10-CM | POA: Diagnosis not present

## 2019-09-29 DIAGNOSIS — N5239 Other post-surgical erectile dysfunction: Secondary | ICD-10-CM | POA: Diagnosis not present

## 2019-09-29 DIAGNOSIS — R972 Elevated prostate specific antigen [PSA]: Secondary | ICD-10-CM | POA: Diagnosis not present

## 2019-10-17 DIAGNOSIS — F4323 Adjustment disorder with mixed anxiety and depressed mood: Secondary | ICD-10-CM | POA: Diagnosis not present

## 2019-10-18 DIAGNOSIS — H52213 Irregular astigmatism, bilateral: Secondary | ICD-10-CM | POA: Diagnosis not present

## 2019-10-18 DIAGNOSIS — H2512 Age-related nuclear cataract, left eye: Secondary | ICD-10-CM | POA: Diagnosis not present

## 2019-10-18 DIAGNOSIS — H2589 Other age-related cataract: Secondary | ICD-10-CM | POA: Diagnosis not present

## 2019-10-18 DIAGNOSIS — H2513 Age-related nuclear cataract, bilateral: Secondary | ICD-10-CM | POA: Diagnosis not present

## 2019-10-18 DIAGNOSIS — H25013 Cortical age-related cataract, bilateral: Secondary | ICD-10-CM | POA: Diagnosis not present

## 2019-10-24 DIAGNOSIS — F4323 Adjustment disorder with mixed anxiety and depressed mood: Secondary | ICD-10-CM | POA: Diagnosis not present

## 2019-10-26 DIAGNOSIS — R972 Elevated prostate specific antigen [PSA]: Secondary | ICD-10-CM | POA: Diagnosis not present

## 2019-10-26 DIAGNOSIS — N528 Other male erectile dysfunction: Secondary | ICD-10-CM | POA: Diagnosis not present

## 2019-10-27 ENCOUNTER — Telehealth: Payer: Self-pay | Admitting: *Deleted

## 2019-10-27 NOTE — Telephone Encounter (Signed)
Patient with diagnosis of ATRIAL FIBRILLATION on ELIQUIS for anticoagulation.    Procedure: PROSTATE BIOPSY Date of procedure: 12/15/2019  CHADS2-VASc score of  2 ( HTN, AGE)  *no hx of VTE/stroke or TIA noted*  CrCl = 71 ML/MIN  Per office protocol, patient can hold ELIQUIS for 2 days prior to procedure.      Please resume ELIQUIS as soon as possible after procedure.

## 2019-10-27 NOTE — Telephone Encounter (Signed)
   [facility] Pre-operative Risk Assessment    HEARTCARE STAFF: - Please ensure there is not already an duplicate clearance open for this procedure. - Under Visit Info/Reason for Call, type in Other and utilize the format Clearance MM/DD/YY or Clearance TBD. Do not use dashes or single digits. - If request is for dental extraction, please clarify the # of teeth to be extracted.  Request for surgical clearance:  1. What type of surgery is being performed? PROSTATE Bx   2. When is this surgery scheduled? 12/15/19   3. What type of clearance is required (medical clearance vs. Pharmacy clearance to hold med vs. Both)? BOTH  4. Are there any medications that need to be held prior to surgery and how long? ELIQUIS   5. Practice name and name of physician performing surgery? [facility]; DR. Harrell Gave WINTER   6. What is the office phone number? [phone]   7.   What is the office fax number? [phone]  8.   Anesthesia type (None, local, MAC, general) ? NONE TO BE USED PER DR. Cherly Beach Lariya Kinzie 10/27/2019, 1:20 PM  _________________________________________________________________   (provider comments below)

## 2019-10-30 NOTE — Telephone Encounter (Signed)
° °  Primary Cardiologist: [facility]  Chart reviewed as part of pre-operative protocol coverage. [name] may hold Eliquis 2 days prior to planned procedure per PharmD. He should resume Eliquis as soon as possible after the procedure.   He has an upcoming appointment 11/24/19 with Truitt Merle, NP. His preoperative clearance will be addressed at that appointment   Procedure: Prostate biopsy Date: 12/15/19  I will route this update to the requesting party via [place] fax function and remove from pre-op pool.  Please call with questions.  Loel Dubonnet, NP 10/30/2019, 12:50 PM

## 2019-10-30 NOTE — Telephone Encounter (Signed)
Hi Lori,   Just an FYI this gentleman will need clearance for his prostate biopsy at his 11/21/19 appointment with you. I have updated the appt notes.   Loel Dubonnet, NP

## 2019-10-31 DIAGNOSIS — F4323 Adjustment disorder with mixed anxiety and depressed mood: Secondary | ICD-10-CM | POA: Diagnosis not present

## 2019-11-13 NOTE — Progress Notes (Signed)
CARDIOLOGY OFFICE NOTE  Date:  11/21/2019    Robert Ochoa Date of Birth: 1945/07/13 Medical Record #[id number]  PCP:  Robert Pepper, MD  Cardiologist:  [facility]    Chief Complaint  Patient presents with  . Follow-up  . Pre-op Exam    Seen for Dr. Rayann Ochoa    History of Present Illness: Robert Ochoa is a 74 y.o. male who presents today for a follow up/pre op visit. Seen for Dr. Rayann Ochoa.   He has multiple medical issues which include past pneumonia with associated sepsis, renal failure and respiratory failure back in 2013. Has atrial fib - maintained in NSR on very low dose amiodarone. Has an ICD/pacemaker in place with a set lower rate of 60. Last EF was 35 to 40% back in October of 2013 and increased to 45 to 50% per echo from October of 2014. Normal EF by echo from 2016. He is managed medically. His other issues include right ventricular dysplasia, ICD implant, PAF and paroxysmal VT. He remains on chronic amiodarone and chronic Eliquis. Last PFT was January of 2017. No CAD per cath back in 2010.   I saw him back in [place] 2017- he was actively losing weight. He was doing great and happy with how he was feeling. We got his echo updated due to aortic root enlargement - this has showed that his EF haddropped back - he is not able to have an MRI due his device implant. He is on a good CHF regimen. His study was reviewed with Dr. Rayann Ochoa who wanted to keep him on his current regimen and follow him along. Patient was agreeable to continuing with his current management with plans to repeat echo 6 months later- and EF was 40 to 45% - he has done well since.   He has been referred back for repeat genetic testing -   Dr. 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 This variant has been reported in three individuals with [facility] [name] et al., 2010, Haggerty et al., 2017) and by two other clinical diagnostic  labs. This variant is located in a well-established functional domain that also contains the highest number of mutations in DSG2. Additionally, computational algorithms suggest a likely pathogenic role for this variant. However, as very few individuals have been reported to harbor this variant and its role in gene function has not been elucidated the DSG2 p.Phe321Ile variant is classified as a variant of unknown clinical significance.   In light of the molecular evidence for this variant, it is not recommended to pursue genetic testing of the familial variant in Robert Ochoa's daughter at this time".   I saw him a year ago and he was doing well. Walking 4 [place] a day. Last seen by Dr. Rayann Ochoa back in [place] 2021 by a virtual visit - felt to be doing ok.   Comes in today. Here alone. Needing prostate biopsy. Remains on Eliquis - he is to hold this for 2 days prior to his procedure per pharmacy. Asking for samples - he is in the doughnut hole.  He feels good. Walking 4 [place] most days. No chest pain. Breathing is good. No passing out. Feels good on his medicines. Echo was updated in March - EF is now NORMAL! He feels good. Has not had his PFTs - on chronic amiodarone and needs labs. He has been primary caregiver for his daughter and her breast cancer journey.  Past Medical History:  Diagnosis Date  . Anxiety   . Arrhythmogenic right ventricular dysplasia (HCC)    Hx of VT s/p dual-chamber St. Jude ICD. Cardiac MRI 2010 showed moderately dilated RV with hypokinesis, LV EF 47%. EF 50-55% 2010 but down to 45% 05/2011. Cath 01/2009 without significant CAD.  [name] Atrial fibrillation or flutter    Paroxysmal. Started on Pradaxa 05/2011.  . Cardiac defibrillator in place   . LV dysfunction    per echo 10/2011; EF 35%  // Echo 2/18: EF 40-45, diff HK, Gr 1 DD, mild dilated aortic root, mod LAE, mod RVE, mild reduced RVSF, mod RAE  . Septic shock(785.52) 10-27-11   with associated respiratory/renal  failure/PAF/bradycardia  . Ventricular tachycardia ([place])    ICD discharges 05/2011 for this & AF-RVR - started on amiodarone then.    Past Surgical History:  Procedure Laterality Date  . APPENDECTOMY    . CARDIAC DEFIBRILLATOR PLACEMENT  02/19/09    St,. Jude   . IMPLANTABLE CARDIOVERTER DEFIBRILLATOR (ICD) GENERATOR CHANGE N/A 03/01/2013   Procedure: ICD GENERATOR CHANGE;  Surgeon: [name], MD;  Location: [facility] CATH LAB;  Service: Cardiovascular;  Laterality: N/A;  . IMPLANTABLE CARDIOVERTER DEFIBRILLATOR GENERATOR CHANGE  03/01/13   for premature battery depletetion.  Now has a SJM Ellipse DR device placed by Dr Caryl Comes     Medications: Current Meds  Medication Sig  . amiodarone (PACERONE) 200 MG tablet TAKE 1/2 TABLET(100 MG) BY MOUTH DAILY  . Calcium-Magnesium-Zinc (CAL-MAG-ZINC PO) Take 2 tablets by mouth daily.   [name] ELIQUIS 5 MG TABS tablet TAKE 1 TABLET BY MOUTH TWICE DAILY  . ketorolac (ACULAR) 0.5 % ophthalmic solution   . lisinopril (ZESTRIL) 10 MG tablet TAKE 2 TABLETS BY MOUTH EVERY DAY  . metoprolol tartrate (LOPRESSOR) 25 MG tablet TAKE 3 TABLETS BY MOUTH TWICE DAILY  . ofloxacin (OCUFLOX) 0.3 % ophthalmic solution   . Omega-3 Fatty Acids (FISH OIL) 1000 MG CAPS Take 3 capsules by mouth daily.   . prednisoLONE acetate (PRED FORTE) 1 % ophthalmic suspension   . sildenafil (VIAGRA) 100 MG tablet Take 1 tablet (100 mg total) by mouth daily as needed for erectile dysfunction.  . vitamin C (ASCORBIC ACID) 500 MG tablet Take 1,000 mg by mouth daily.      Allergies: No Known Allergies  Social History: The patient  reports that he has never smoked. He has never used smokeless tobacco. He reports current alcohol use. He reports that he does not use drugs.   Family History: The patient's family history includes Allergies in his mother; Heart attack in his father.   Review of Systems: Please see the history of present illness.   All other systems are reviewed and  negative.   Physical Exam: VS:  BP 110/84   Pulse 60   Ht 6\' 2"  (1.88 m)   Wt 200 lb 12.8 oz (91.1 kg)   SpO2 95%   BMI 25.78 kg/m  .  BMI Body mass index is 25.78 kg/m.  Wt Readings from Last 3 Encounters:  11/21/19 200 lb 12.8 oz (91.1 kg)  05/22/19 197 lb (89.4 kg)  11/22/18 197 lb 1.9 oz (89.4 kg)    General: Pleasant. Alert and in no acute distress. He looks younger than his stated age.    Cardiac: Regular rate and rhythm. No murmurs, rubs, or gallops. No edema.  Respiratory:  Lungs are clear to auscultation bilaterally with normal work of breathing.  GI: Soft and nontender.  MS: No deformity or atrophy. Gait and ROM intact.  Skin: Warm and dry. Color is normal.  Neuro:  Strength and sensation are intact and no gross focal deficits noted.  Psych: Alert, appropriate and with normal affect.   LABORATORY DATA:  EKG:  EKG is ordered today.  This shows A pacing with long AV delay - underlying sinus.   Lab Results  Component Value Date   WBC 6.1 11/22/2018   HGB 15.8 11/22/2018   HCT 47.3 11/22/2018   PLT 207 11/22/2018   GLUCOSE 89 06/07/2019   CHOL 166 11/22/2018   TRIG 90 11/22/2018   HDL 49 11/22/2018   LDLCALC 99 11/22/2018   ALT 26 06/07/2019   AST 35 06/07/2019   NA 137 06/07/2019   K 4.7 06/07/2019   CL 103 06/07/2019   CREATININE 1.15 06/07/2019   BUN 15 06/07/2019   CO2 19 (L) 06/07/2019   TSH 1.060 06/07/2019   INR 1.18 10/30/2011     BNP (last 3 results) No results for input(s): BNP in the last 8760 hours.  ProBNP (last 3 results) No results for input(s): PROBNP in the last 8760 hours.   Other Studies Reviewed Today:   ECHO IMPRESSIONS 05/2019  1. Left ventricular ejection fraction, by estimation, is 55 to 60%. The  left ventricle has normal function. The left ventricle has no regional  wall motion abnormalities. Left ventricular diastolic parameters are  consistent with Grade I diastolic  dysfunction (impaired relaxation).  2.  Right ventricular systolic function is normal. The right ventricular  size is normal. There is normal pulmonary artery systolic pressure.  3. Right atrial size was mildly dilated.  4. The mitral valve is normal in structure. No evidence of mitral valve  regurgitation. No evidence of mitral stenosis.  5. The aortic valve is normal in structure. Aortic valve regurgitation is  not visualized. No aortic stenosis is present.  6. Aortic dilatation noted. There is mild dilatation of the ascending  aorta measuring 38 mm.    ECHO IMPRESSIONS 05/2018  1. The left ventricle has mildly reduced systolic function, with an ejection fraction of 45-50%. The cavity size was normal. Left ventricular diastolic Doppler parameters are consistent with impaired relaxation Left ventricular diffuse hypokinesis. 2. The right ventricle has mildly reduced systolic function. The cavity was severely enlarged. There is no increase in right ventricular wall thickness. 3. Left atrial size was mildly dilated. 4. Right atrial size was severely dilated. 5. The mitral valve is normal in structure. 6. The tricuspid valve is normal in structure. 7. The aortic valve is tricuspid. 8. The pulmonic valve was normal in structure. 9. There is mild dilatation of the aortic root measuring 39 mm.  Echo Study Conclusions from 04/2016  - Left ventricle: The cavity size was normal. Wall thickness was normal. Systolic function was mildly to moderately reduced. The estimated ejection fraction was in the range of 40% to 45%. Diffuse hypokinesis. Doppler parameters are consistent with abnormal left ventricular relaxation (grade 1 diastolic dysfunction). - Aortic root: The aortic root was mildly dilated. - Left atrium: The atrium was moderately dilated. - Right ventricle: The cavity size was moderately dilated. Systolic function was mildly reduced. - Right atrium: The atrium was moderately  dilated.  Impressions:  - Mild to moderate global reduction in LV systolic function; grade 1 diastolic dysfunction; moderate biatrial enlargement; moderate RVE with mildly reduced function.   Echo Study Conclusions from 09/2015  - Left ventricle: The cavity size was mildly dilated.  Wall thickness was normal. Systolic function was moderately to severely reduced. The estimated ejection fraction was in the range of 30% to 35%. Doppler parameters are consistent with abnormal left ventricular relaxation (grade 1 diastolic dysfunction). - Right ventricle: The cavity size was mildly dilated. Systolic function was moderately to severely reduced. - Right atrium: The atrium was mildly dilated.   Echo Study Conclusions from 09/2014  - Left ventricle: The cavity size was normal. Wall thickness was  increased in a pattern of mild LVH. Systolic function was normal.  The estimated ejection fraction was in the range of 50% to 55%.  Doppler parameters are consistent with abnormal left ventricular  relaxation (grade 1 diastolic dysfunction). The E/e&' ratio is <8,  suggesting normal LV filling pressure. - Aorta: Aortic root dimension: 42 mm (ED). - Aortic root: The aortic root is dilated. - Left atrium: The atrium was normal in size. - Right atrium: The atrium was mildly dilated. - Tricuspid valve: There was mild regurgitation. - Pulmonary arteries: PA peak pressure: 33 mm Hg (S). - Inferior vena cava: The vessel was normal in size. The  respirophasic diameter changes were in the normal range (>= 50%),  consistent with normal central venous pressure.  Impressions:  - LVEF 50-55%, incoordinate septal motion, AICD wires in place,  diastolic dysfunction, normal LV filling pressure, mildly dilated  aortic root to 4.2 cm, mildly dilated RA, mild TR, top normal  RVSP at 33 mmHg.   Echo Study Conclusions from 2014  - Left ventricle: The cavity size was  normal. Wall thickness was normal. Systolic function was mildly reduced. The estimated ejection fraction was in the range of 45% to 50%. Diffuse hypokinesis. Doppler parameters are consistent with abnormal left ventricular relaxation (grade 1 diastolic dysfunction). - Left atrium: The atrium was mildly dilated. - Right ventricle: The cavity size was moderately to severely dilated. Systolic function was moderately reduced. - Right atrium: The atrium was moderately dilated. - Atrial septum: There was an atrial septal aneurysm. - Pulmonary arteries: PA peak pressure: 74mm Hg (S).    ASSESSMENT & PLAN:    1. Chronic systolic HF - EF has improved and is currently normal. Has had prior variation. He is NYHA I.   2. VT - on low dose amiodarone - needs labs and his PFTs - will get this scheduled. CXR was last year. Has ICD in place.    3. ARVD - on beta blocker and ACE - recent echo with normalization of his EF.   4. PAF - remains on Eliquis for stroke prevention. In sinus today.   5. History of lung disease - does not seem to be an issue since his weight loss and exercise program. He is not seeing pulmonary any more. Needs PFTs.   6. HLD - managed with diet/exercise. He is not on statin.   Current medicines are reviewed with the patient today.  The patient does not have concerns regarding medicines other than what has been noted above.  The following changes have been made:  See above.  Labs/ tests ordered today include:    Orders Placed This Encounter  Procedures  . Basic metabolic panel  . CBC  . Hepatic function panel  . Lipid panel  . TSH  . EKG 12-Lead     Disposition:   FU with Dr. Rayann Ochoa in 6 months. Labs today and will get his PFTs arranged - he has had 2 COVID vaccines.    Patient is agreeable to this plan and  will call if any problems develop in the interim.   SignedTruitt Merle, NP  11/21/2019 12:07 PM  [place] [address] [place], [place]  [address] Phone: [phone] Fax: [phone]

## 2019-11-14 DIAGNOSIS — F4323 Adjustment disorder with mixed anxiety and depressed mood: Secondary | ICD-10-CM | POA: Diagnosis not present

## 2019-11-21 ENCOUNTER — Encounter: Payer: Self-pay | Admitting: Nurse Practitioner

## 2019-11-21 ENCOUNTER — Other Ambulatory Visit: Payer: Self-pay

## 2019-11-21 ENCOUNTER — Ambulatory Visit (INDEPENDENT_AMBULATORY_CARE_PROVIDER_SITE_OTHER): Payer: Medicare Other | Admitting: Nurse Practitioner

## 2019-11-21 VITALS — BP 110/84 | HR 60 | Ht 74.0 in | Wt 200.8 lb

## 2019-11-21 DIAGNOSIS — I4729 Other ventricular tachycardia: Secondary | ICD-10-CM

## 2019-11-21 DIAGNOSIS — I428 Other cardiomyopathies: Secondary | ICD-10-CM

## 2019-11-21 DIAGNOSIS — I472 Ventricular tachycardia: Secondary | ICD-10-CM

## 2019-11-21 DIAGNOSIS — Z79899 Other long term (current) drug therapy: Secondary | ICD-10-CM | POA: Diagnosis not present

## 2019-11-21 DIAGNOSIS — Q208 Other congenital malformations of cardiac chambers and connections: Secondary | ICD-10-CM

## 2019-11-21 DIAGNOSIS — F4323 Adjustment disorder with mixed anxiety and depressed mood: Secondary | ICD-10-CM | POA: Diagnosis not present

## 2019-11-21 DIAGNOSIS — I480 Paroxysmal atrial fibrillation: Secondary | ICD-10-CM | POA: Diagnosis not present

## 2019-11-21 DIAGNOSIS — Z01810 Encounter for preprocedural cardiovascular examination: Secondary | ICD-10-CM

## 2019-11-21 LAB — CBC
Hematocrit: 47.4 % (ref 37.5–51.0)
Hemoglobin: 15.9 g/dL (ref 13.0–17.7)
MCH: 29 pg (ref 26.6–33.0)
MCHC: 33.5 g/dL (ref 31.5–35.7)
MCV: 87 fL (ref 79–97)
Platelets: 178 10*3/uL (ref 150–450)
RBC: 5.48 x10E6/uL (ref 4.14–5.80)
RDW: 12.9 % (ref 11.6–15.4)
WBC: 5.6 10*3/uL (ref 3.4–10.8)

## 2019-11-21 LAB — BASIC METABOLIC PANEL
BUN/Creatinine Ratio: 24 (ref 10–24)
BUN: 25 mg/dL (ref 8–27)
CO2: 19 mmol/L — ABNORMAL LOW (ref 20–29)
Calcium: 9.4 mg/dL (ref 8.6–10.2)
Chloride: 103 mmol/L (ref 96–106)
Creatinine, Ser: 1.06 mg/dL (ref 0.76–1.27)
GFR calc Af Amer: 80 mL/min/{1.73_m2} (ref >59)
GFR calc non Af Amer: 69 mL/min/{1.73_m2} (ref >59)
Glucose: 99 mg/dL (ref 65–99)
Potassium: 4.9 mmol/L (ref 3.5–5.2)
Sodium: 136 mmol/L (ref 134–144)

## 2019-11-21 LAB — HEPATIC FUNCTION PANEL
ALT: 18 IU/L (ref 0–44)
AST: 24 IU/L (ref 0–40)
Albumin: 4.2 g/dL (ref 3.7–4.7)
Alkaline Phosphatase: 72 IU/L (ref 48–121)
Bilirubin Total: 0.8 mg/dL (ref 0.0–1.2)
Bilirubin, Direct: 0.2 mg/dL (ref 0.00–0.40)
Total Protein: 6.5 g/dL (ref 6.0–8.5)

## 2019-11-21 LAB — LIPID PANEL
Chol/HDL Ratio: 3.5 ratio (ref 0.0–5.0)
Cholesterol, Total: 172 mg/dL (ref 100–199)
HDL: 49 mg/dL (ref >39)
LDL Chol Calc (NIH): 108 mg/dL — ABNORMAL HIGH (ref 0–99)
Triglycerides: 82 mg/dL (ref 0–149)
VLDL Cholesterol Cal: 15 mg/dL (ref 5–40)

## 2019-11-21 LAB — TSH: TSH: 1.03 u[IU]/mL (ref 0.450–4.500)

## 2019-11-21 NOTE — Patient Instructions (Addendum)
After Visit Summary:  We will be checking the following labs today - BMET, CBC, HPF, Lipids and TSH   Medication Instructions:    Continue with your current medicines.    If you need a refill on your cardiac medications before your next appointment, please call your pharmacy.     Testing/Procedures To Be Arranged:  Needs PFTs scheduled (order released from Dr. Rayann Heman)  Follow-Up:   See Dr. Rayann Heman in 6 months    At [facility], you and your health needs are our priority.  As part of our continuing mission to provide you with exceptional heart care, we have created designated Provider Care Teams.  These Care Teams include your primary Cardiologist (physician) and Advanced Practice Providers (APPs -  Physician Assistants and Nurse Practitioners) who all work together to provide you with the care you need, when you need it.  Special Instructions:  . Stay safe, wash your hands for at least 20 seconds and wear a mask when needed.  . It was good to talk with you today.  Madaline Brilliant to proceed with prostate biopsy - hold Eliquis 2 days prior and restart as soon as the surgeon says it is ok.    Call the [place] office at [phone] if you have any questions, problems or concerns.

## 2019-11-24 ENCOUNTER — Other Ambulatory Visit: Payer: Self-pay | Admitting: Internal Medicine

## 2019-11-28 DIAGNOSIS — F4323 Adjustment disorder with mixed anxiety and depressed mood: Secondary | ICD-10-CM | POA: Diagnosis not present

## 2019-12-05 DIAGNOSIS — F4323 Adjustment disorder with mixed anxiety and depressed mood: Secondary | ICD-10-CM | POA: Diagnosis not present

## 2019-12-11 ENCOUNTER — Ambulatory Visit (INDEPENDENT_AMBULATORY_CARE_PROVIDER_SITE_OTHER): Payer: Medicare Other | Admitting: *Deleted

## 2019-12-11 DIAGNOSIS — I472 Ventricular tachycardia: Secondary | ICD-10-CM | POA: Diagnosis not present

## 2019-12-11 DIAGNOSIS — I4729 Other ventricular tachycardia: Secondary | ICD-10-CM

## 2019-12-12 DIAGNOSIS — F4323 Adjustment disorder with mixed anxiety and depressed mood: Secondary | ICD-10-CM | POA: Diagnosis not present

## 2019-12-12 LAB — CUP PACEART REMOTE DEVICE CHECK
Battery Remaining Longevity: 16 mo
Battery Remaining Percentage: 19 %
Battery Voltage: 2.77 V
Brady Statistic AP VP Percent: 11 %
Brady Statistic AP VS Percent: 82 %
Brady Statistic AS VP Percent: 1 %
Brady Statistic AS VS Percent: 6.5 %
Brady Statistic RA Percent Paced: 89 %
Brady Statistic RV Percent Paced: 12 %
Date Time Interrogation Session: 20210913020037
HighPow Impedance: 55 Ohm
HighPow Impedance: 55 Ohm
Implantable Lead Implant Date: 20101123
Implantable Lead Implant Date: 20101123
Implantable Lead Location: 753859
Implantable Lead Location: 753860
Implantable Lead Model: 7121
Implantable Pulse Generator Implant Date: 20141203
Lead Channel Impedance Value: 430 Ohm
Lead Channel Impedance Value: 440 Ohm
Lead Channel Pacing Threshold Amplitude: 0.75 V
Lead Channel Pacing Threshold Amplitude: 1 V
Lead Channel Pacing Threshold Pulse Width: 0.5 ms
Lead Channel Pacing Threshold Pulse Width: 0.5 ms
Lead Channel Sensing Intrinsic Amplitude: 2.6 mV
Lead Channel Sensing Intrinsic Amplitude: 8.9 mV
Lead Channel Setting Pacing Amplitude: 2.5 V
Lead Channel Setting Pacing Amplitude: 2.5 V
Lead Channel Setting Pacing Pulse Width: 0.5 ms
Lead Channel Setting Sensing Sensitivity: 0.5 mV
Pulse Gen Serial Number: 7136426

## 2019-12-13 NOTE — Progress Notes (Signed)
Remote ICD transmission.   

## 2019-12-15 DIAGNOSIS — C61 Malignant neoplasm of prostate: Secondary | ICD-10-CM | POA: Diagnosis not present

## 2019-12-15 DIAGNOSIS — R972 Elevated prostate specific antigen [PSA]: Secondary | ICD-10-CM | POA: Diagnosis not present

## 2019-12-19 DIAGNOSIS — H25812 Combined forms of age-related cataract, left eye: Secondary | ICD-10-CM | POA: Diagnosis not present

## 2019-12-19 DIAGNOSIS — H2512 Age-related nuclear cataract, left eye: Secondary | ICD-10-CM | POA: Diagnosis not present

## 2019-12-22 DIAGNOSIS — R310 Gross hematuria: Secondary | ICD-10-CM | POA: Diagnosis not present

## 2019-12-22 DIAGNOSIS — C61 Malignant neoplasm of prostate: Secondary | ICD-10-CM | POA: Diagnosis not present

## 2019-12-25 DIAGNOSIS — C61 Malignant neoplasm of prostate: Secondary | ICD-10-CM | POA: Diagnosis not present

## 2019-12-25 DIAGNOSIS — N5201 Erectile dysfunction due to arterial insufficiency: Secondary | ICD-10-CM | POA: Diagnosis not present

## 2019-12-26 DIAGNOSIS — F4323 Adjustment disorder with mixed anxiety and depressed mood: Secondary | ICD-10-CM | POA: Diagnosis not present

## 2020-01-02 DIAGNOSIS — F4323 Adjustment disorder with mixed anxiety and depressed mood: Secondary | ICD-10-CM | POA: Diagnosis not present

## 2020-01-03 ENCOUNTER — Other Ambulatory Visit: Payer: Self-pay | Admitting: Urology

## 2020-01-03 ENCOUNTER — Other Ambulatory Visit: Payer: Self-pay

## 2020-01-03 ENCOUNTER — Ambulatory Visit (INDEPENDENT_AMBULATORY_CARE_PROVIDER_SITE_OTHER): Payer: Medicare Other | Admitting: Pulmonary Disease

## 2020-01-03 ENCOUNTER — Other Ambulatory Visit (HOSPITAL_COMMUNITY): Payer: Self-pay | Admitting: Urology

## 2020-01-03 DIAGNOSIS — I480 Paroxysmal atrial fibrillation: Secondary | ICD-10-CM

## 2020-01-03 DIAGNOSIS — Z79899 Other long term (current) drug therapy: Secondary | ICD-10-CM | POA: Diagnosis not present

## 2020-01-03 DIAGNOSIS — C61 Malignant neoplasm of prostate: Secondary | ICD-10-CM

## 2020-01-03 LAB — PULMONARY FUNCTION TEST
DL/VA % pred: 77 %
DL/VA: 3.01 ml/min/mmHg/L
DLCO cor % pred: 95 %
DLCO cor: 26.99 ml/min/mmHg
DLCO unc % pred: 95 %
DLCO unc: 26.99 ml/min/mmHg
FEF 25-75 Post: 4.29 L/sec
FEF 25-75 Pre: 4 L/sec
FEF2575-%Change-Post: 7 %
FEF2575-%Pred-Post: 164 %
FEF2575-%Pred-Pre: 153 %
FEV1-%Change-Post: 2 %
FEV1-%Pred-Post: 130 %
FEV1-%Pred-Pre: 127 %
FEV1-Post: 4.69 L
FEV1-Pre: 4.59 L
FEV1FVC-%Change-Post: 0 %
FEV1FVC-%Pred-Pre: 106 %
FEV6-%Change-Post: 3 %
FEV6-%Pred-Post: 131 %
FEV6-%Pred-Pre: 127 %
FEV6-Post: 6.13 L
FEV6-Pre: 5.95 L
FEV6FVC-%Pred-Post: 105 %
FEV6FVC-%Pred-Pre: 105 %
FVC-%Change-Post: 3 %
FVC-%Pred-Post: 124 %
FVC-%Pred-Pre: 120 %
FVC-Post: 6.13 L
FVC-Pre: 5.95 L
Post FEV1/FVC ratio: 77 %
Post FEV6/FVC ratio: 100 %
Pre FEV1/FVC ratio: 77 %
Pre FEV6/FVC Ratio: 100 %
RV % pred: 91 %
RV: 2.53 L
TLC % pred: 112 %
TLC: 8.85 L

## 2020-01-03 NOTE — Progress Notes (Signed)
Full PFT performed today. °

## 2020-01-05 DIAGNOSIS — C61 Malignant neoplasm of prostate: Secondary | ICD-10-CM | POA: Diagnosis not present

## 2020-01-08 ENCOUNTER — Encounter: Payer: Self-pay | Admitting: Radiation Oncology

## 2020-01-08 DIAGNOSIS — C61 Malignant neoplasm of prostate: Secondary | ICD-10-CM | POA: Insufficient documentation

## 2020-01-08 NOTE — Progress Notes (Signed)
GU Location of Tumor / Histology: prostatic adenocarcinoma  If Prostate Cancer, Gleason Score is (4 + 3) and PSA is (5.98). Prostate volume: 35.5 g.  Robert Ochoa previously followed by Dr. Risa Grill with a history of elevated PSA and ED. Referred by Dr. Lovena Neighbours.  09/2019 psa 5.98 08/2019 psa 6.88 10/2016 psa 3.25 2017 psa 2.89 2016 psa 2.63 2015 psa 2.62 2014 psa 2.71  Biopsies of prostate (if applicable) revealed:    Past/Anticipated interventions by urology, if any: prostate biopsy, CT abd/pelvis (negative), bone scan scheduled for 01/15/2020, referral for consideration of radiation therapy, evaluated by Dr. Louis Meckel reference prostatectomy  Past/Anticipated interventions by medical oncology, if any: no  Weight changes, if any: no  Bowel/Bladder complaints, if any: IPSS 6. SHIM 16 Reports occasional episodes of urgency and frequency during the day. Reports nocturia x 1-2. Reports ED but admits he isn't presently active. Denies dysuria, hematuria, urinary leakage or incontinence. Denies any bowel complaints.  Nausea/Vomiting, if any: no  Pain issues, if any:  denies  SAFETY ISSUES:  Prior radiation? denies  Pacemaker/ICD? yes, pacemaker  Possible current pregnancy? no, male patient  Is the patient on methotrexate? no  Current Complaints / other details:  74 year old male. Divorced with 1 child. Resides in [place].

## 2020-01-09 ENCOUNTER — Other Ambulatory Visit: Payer: Self-pay

## 2020-01-09 ENCOUNTER — Ambulatory Visit
Admission: RE | Admit: 2020-01-09 | Discharge: 2020-01-09 | Disposition: A | Discharge status: Home / Self Care | Payer: Medicare Other | Source: Ambulatory Visit | Attending: Radiation Oncology | Admitting: Radiation Oncology

## 2020-01-09 ENCOUNTER — Encounter: Payer: Self-pay | Admitting: Radiation Oncology

## 2020-01-09 ENCOUNTER — Encounter: Payer: Self-pay | Admitting: Medical Oncology

## 2020-01-09 VITALS — BP 132/87 | HR 74 | Temp 97.2°F | Resp 18 | Ht 74.0 in | Wt 205.4 lb

## 2020-01-09 DIAGNOSIS — Z923 Personal history of irradiation: Secondary | ICD-10-CM | POA: Diagnosis not present

## 2020-01-09 DIAGNOSIS — I4891 Unspecified atrial fibrillation: Secondary | ICD-10-CM | POA: Diagnosis not present

## 2020-01-09 DIAGNOSIS — Z7901 Long term (current) use of anticoagulants: Secondary | ICD-10-CM | POA: Diagnosis not present

## 2020-01-09 DIAGNOSIS — C61 Malignant neoplasm of prostate: Secondary | ICD-10-CM | POA: Diagnosis not present

## 2020-01-09 DIAGNOSIS — F419 Anxiety disorder, unspecified: Secondary | ICD-10-CM | POA: Insufficient documentation

## 2020-01-09 DIAGNOSIS — R972 Elevated prostate specific antigen [PSA]: Secondary | ICD-10-CM | POA: Diagnosis not present

## 2020-01-09 DIAGNOSIS — Z79899 Other long term (current) drug therapy: Secondary | ICD-10-CM | POA: Insufficient documentation

## 2020-01-09 HISTORY — DX: Malignant neoplasm of prostate: C61

## 2020-01-09 NOTE — Addendum Note (Signed)
Encounter addended by: Tyler Pita, MD on: 01/09/2020 2:28 PM  Actions taken: Level of Service modified

## 2020-01-09 NOTE — Progress Notes (Signed)
Radiation Oncology         [phone]) [phone] ________________________________  Initial outpatient Consultation  Name: Robert Ochoa MRN: [id number]  Date: 01/09/2020  DOB: Aug 31, 1945  CC:London Pepper, MD  Davis Gourd*   REFERRING PHYSICIAN: Davis Gourd*  DIAGNOSIS: 74 y.o. gentleman with Stage T1c adenocarcinoma of the prostate with Gleason score of 4+3, and PSA of 5.98.    ICD-10-CM   1. Malignant neoplasm of prostate ([place])  C61     HISTORY OF PRESENT ILLNESS: Robert Ochoa is a 74 y.o. male with a diagnosis of prostate cancer. He was initially followed by Dr. Risa Grill for an elevated PSA for his age dating back to at least 2002. Biopsy performed in 10/2000 was benign.  When his PSA rose above 4 in 2009, a PCA3-D test was performed showing a negative result.  Subsequent PSA results stayed under 4 through 2018. It appears he was lost to follow up with Dr. Cy Blamer retirement.  When he returned in 08/2019, his PSA was 6.88.  Repeat a month later had lower slightly to 5.98.  At that time, his care was switched to Dr. Lovena Neighbours, who recommended the patient undergo repeat biopsy.  The patient proceeded to transrectal ultrasound with 12 biopsies of the prostate on 12/15/2019.  The prostate volume measured 35.5 cc.  Out of 12 core biopsies, 5 were positive.  The maximum Gleason score was 4+3, and this was seen in left mid (small focus) and right mid lateral (with PNI). Additionally, Gleason 3+4 was seen in right mid, right apex (with PNI), and right apex lateral.  He is scheduled for staging studies, consisting of CT A/P and bone scan, on 01/15/2020.  The patient reviewed the biopsy results with his urologist and he has kindly been referred today for discussion of potential radiation treatment options.   PREVIOUS RADIATION THERAPY: No  PAST MEDICAL HISTORY:  Past Medical History:  Diagnosis Date  . Anxiety   . Arrhythmogenic right ventricular dysplasia (HCC)    Hx of VT s/p  dual-chamber St. Jude ICD. Cardiac MRI 2010 showed moderately dilated RV with hypokinesis, LV EF 47%. EF 50-55% 2010 but down to 45% 05/2011. Cath 01/2009 without significant CAD.  [name] Atrial fibrillation or flutter    Paroxysmal. Started on Pradaxa 05/2011.  . Cardiac defibrillator in place   . LV dysfunction    per echo 10/2011; EF 35%  // Echo 2/18: EF 40-45, diff HK, Gr 1 DD, mild dilated aortic root, mod LAE, mod RVE, mild reduced RVSF, mod RAE  . Prostate cancer ([place])   . Septic shock(785.52) 10-27-11   with associated respiratory/renal failure/PAF/bradycardia  . Ventricular tachycardia ([place])    ICD discharges 05/2011 for this & AF-RVR - started on amiodarone then.      PAST SURGICAL HISTORY: Past Surgical History:  Procedure Laterality Date  . APPENDECTOMY    . CARDIAC DEFIBRILLATOR PLACEMENT  02/19/09    St,. Jude   . IMPLANTABLE CARDIOVERTER DEFIBRILLATOR (ICD) GENERATOR CHANGE N/A 03/01/2013   Procedure: ICD GENERATOR CHANGE;  Surgeon: [name], MD;  Location: [facility] CATH LAB;  Service: Cardiovascular;  Laterality: N/A;  . IMPLANTABLE CARDIOVERTER DEFIBRILLATOR GENERATOR CHANGE  03/01/13   for premature battery depletetion.  Now has a SJM Ellipse DR device placed by Dr Caryl Comes    FAMILY HISTORY:  Family History  Problem Relation Age of Onset  . Heart attack Father   . Allergies Mother   . Prostate cancer Neg Hx   . Colon cancer  Neg Hx   . Pancreatic cancer Neg Hx   . Breast cancer Neg Hx     SOCIAL HISTORY:  Social History   Socioeconomic History  . Marital status: Single    Spouse name: Not on file  . Number of children: 1  . Years of education: Not on file  . Highest education level: Not on file  Occupational History    Employer: [employer]    Comment: Psychologist, sport and exercise  Tobacco Use  . Smoking status: Never Smoker  . Smokeless tobacco: Never Used  Vaping Use  . Vaping Use: Never used  Substance and Sexual Activity  . Alcohol use: Yes    Comment:  social--3 beers per week  . Drug use: No  . Sexual activity: Yes    Birth control/protection: Coitus interruptus  Other Topics Concern  . Not on file  Social History Narrative   Divorced w 1 child. Self employed.    Social Determinants of Health   Financial Resource Strain:   . Difficulty of Paying Living Expenses: Not on file  Food Insecurity:   . Worried About Charity fundraiser in the Last Year: Not on file  . Ran Out of Food in the Last Year: Not on file  Transportation Needs:   . Lack of Transportation (Medical): Not on file  . Lack of Transportation (Non-Medical): Not on file  Physical Activity:   . Days of Exercise per Week: Not on file  . Minutes of Exercise per Session: Not on file  Stress:   . Feeling of Stress : Not on file  Social Connections:   . Frequency of Communication with Friends and Family: Not on file  . Frequency of Social Gatherings with Friends and Family: Not on file  . Attends Religious Services: Not on file  . Active Member of Clubs or Organizations: Not on file  . Attends Archivist Meetings: Not on file  . Marital Status: Not on file  Intimate Partner Violence:   . Fear of Current or Ex-Partner: Not on file  . Emotionally Abused: Not on file  . Physically Abused: Not on file  . Sexually Abused: Not on file    ALLERGIES: Patient has no known allergies.  MEDICATIONS:  Current Outpatient Medications  Medication Sig Dispense Refill  . amiodarone (PACERONE) 200 MG tablet TAKE 1/2 TABLET(100 MG) BY MOUTH DAILY 45 tablet 11  . Calcium-Magnesium-Zinc (CAL-MAG-ZINC PO) Take 2 tablets by mouth daily.     [name] ELIQUIS 5 MG TABS tablet TAKE 1 TABLET BY MOUTH TWICE DAILY 60 tablet 10  . HYDROcodone-acetaminophen (NORCO/VICODIN) 5-325 MG tablet Take 1 tablet by mouth daily as needed.    [name] ketorolac (ACULAR) 0.5 % ophthalmic solution     . levofloxacin (LEVAQUIN) 750 MG tablet Take 750 mg by mouth daily.    [name] lisinopril (ZESTRIL) 10 MG tablet  TAKE 2 TABLETS BY MOUTH EVERY DAY 180 tablet 3  . metoprolol tartrate (LOPRESSOR) 25 MG tablet TAKE 3 TABLETS BY MOUTH TWICE DAILY 540 tablet 3  . ofloxacin (OCUFLOX) 0.3 % ophthalmic solution     . Omega-3 Fatty Acids (FISH OIL) 1000 MG CAPS Take 3 capsules by mouth daily.     . prednisoLONE acetate (PRED FORTE) 1 % ophthalmic suspension     . sildenafil (VIAGRA) 100 MG tablet Take 1 tablet (100 mg total) by mouth daily as needed for erectile dysfunction. 10 tablet 0  . vitamin C (ASCORBIC ACID) 500 MG tablet Take 1,000  mg by mouth daily.      No current facility-administered medications for this encounter.    REVIEW OF SYSTEMS:  On review of systems, the patient reports that he is doing well overall. He denies any chest pain, shortness of breath, cough, fevers, chills, night sweats, unintended weight changes. He denies any bowel disturbances, and denies abdominal pain, nausea or vomiting. He denies any new musculoskeletal or joint aches or pains. His IPSS was 6, indicating mild urinary symptoms. His SHIM was 16, indicating he does have mild erectile dysfunction. A complete review of systems is obtained and is otherwise negative.    PHYSICAL EXAM:  Wt Readings from Last 3 Encounters:  11/21/19 200 lb 12.8 oz (91.1 kg)  05/22/19 197 lb (89.4 kg)  11/22/18 197 lb 1.9 oz (89.4 kg)   Temp Readings from Last 3 Encounters:  03/01/13 98.3 F (36.8 C)  08/08/12 97.3 F (36.3 C) (Temporal)  12/21/11 98 F (36.7 C) (Oral)   BP Readings from Last 3 Encounters:  11/21/19 110/84  11/22/18 130/80  05/23/18 116/70   Pulse Readings from Last 3 Encounters:  11/21/19 60  11/22/18 64  05/23/18 66    /10  In general this is a well appearing man in no acute distress. He's alert and oriented x4 and appropriate throughout the examination. Cardiopulmonary assessment is negative for acute distress and he exhibits normal effort.    KPS = 100  100 - Normal; no complaints; no evidence of  disease. 90   - Able to carry on normal activity; minor signs or symptoms of disease. 80   - Normal activity with effort; some signs or symptoms of disease. 33   - Cares for self; unable to carry on normal activity or to do active work. 60   - Requires occasional assistance, but is able to care for most of his personal needs. 50   - Requires considerable assistance and frequent medical care. 88   - Disabled; requires special care and assistance. 23   - Severely disabled; hospital admission is indicated although death not imminent. 71   - Very sick; hospital admission necessary; active supportive treatment necessary. 10   - Moribund; fatal processes progressing rapidly. 0     - Dead  Karnofsky DA, Abelmann [place], Craver LS and Burchenal JH [phone]) The use of the nitrogen mustards in the palliative treatment of carcinoma: with particular reference to bronchogenic carcinoma Cancer 1 634-56  LABORATORY DATA:  Lab Results  Component Value Date   WBC 5.6 11/21/2019   HGB 15.9 11/21/2019   HCT 47.4 11/21/2019   MCV 87 11/21/2019   PLT 178 11/21/2019   Lab Results  Component Value Date   NA 136 11/21/2019   K 4.9 11/21/2019   CL 103 11/21/2019   CO2 19 (L) 11/21/2019   Lab Results  Component Value Date   ALT 18 11/21/2019   AST 24 11/21/2019   ALKPHOS 72 11/21/2019   BILITOT 0.8 11/21/2019     RADIOGRAPHY: CUP PACEART REMOTE DEVICE CHECK  Result Date: 12/12/2019 Scheduled remote reviewed. Normal device function. AF burden <1%, longest episode 41 minutes. Rates controlled. OAC- Eliquis, on Amiodarone Next remote 91 days- JBox, RN/CVRS     IMPRESSION/PLAN: 1. 74 y.o. gentleman with Stage T1c adenocarcinoma of the prostate with Gleason Score of 4+3, and PSA of 5.98. We discussed the patient's workup and outlined the nature of prostate cancer in this setting. The patient's T stage, Gleason's score, and PSA put him into  the intermediate risk group. Accordingly, he is eligible for a  variety of potential treatment options including brachytherapy, 5.5 weeks of external radiation, or prostatectomy. We discussed the available radiation techniques, and focused on the details and logistics of delivery. We discussed and outlined the risks, benefits, short and long-term effects associated with radiotherapy and compared and contrasted these with prostatectomy. We discussed the role of SpaceOAR gel in reducing the rectal toxicity associated with radiotherapy.  He appears to have a good understanding of his disease and our treatment recommendations which are of curative intent.  He was encouraged to ask questions that were answered to his stated satisfaction.  At the conclusion of our conversation, the patient is interested in moving forward with prostate seed implant.  He wants to take a few days to think before he makes his final decision.  We spent 60 minutes face to face with the patient and more than 50% of that time was spent in counseling and/or coordination of care.   ------------------------------------------------   Tyler Pita, MD [place] Director and Director of Stereotactic Radiosurgery Direct Dial: [phone]  Fax: [phone] [email or web address]  Skype  LinkedIn   This document serves as a record of services personally performed by Tyler Pita, MD. It was created on his behalf by Wilburn Mylar, a trained medical scribe. The creation of this record is based on the scribe's personal observations and the provider's statements to them. This document has been checked and approved by the attending provider.

## 2020-01-09 NOTE — Progress Notes (Signed)
Pacemaker/ICD form has been faxed to Dr. Thompson Grayer to complete before start of xrt. Fax confirmation of delivery obtained. Awaiting return copy.

## 2020-01-10 DIAGNOSIS — H2511 Age-related nuclear cataract, right eye: Secondary | ICD-10-CM | POA: Diagnosis not present

## 2020-01-10 DIAGNOSIS — H25011 Cortical age-related cataract, right eye: Secondary | ICD-10-CM | POA: Diagnosis not present

## 2020-01-11 DIAGNOSIS — Z23 Encounter for immunization: Secondary | ICD-10-CM | POA: Diagnosis not present

## 2020-01-15 ENCOUNTER — Other Ambulatory Visit: Payer: Self-pay

## 2020-01-15 ENCOUNTER — Telehealth: Payer: Self-pay | Admitting: *Deleted

## 2020-01-15 ENCOUNTER — Encounter (HOSPITAL_COMMUNITY)
Admission: RE | Admit: 2020-01-15 | Discharge: 2020-01-15 | Disposition: A | Discharge status: Home / Self Care | Payer: Medicare Other | Source: Ambulatory Visit | Attending: Urology | Admitting: Urology

## 2020-01-15 DIAGNOSIS — C61 Malignant neoplasm of prostate: Secondary | ICD-10-CM | POA: Insufficient documentation

## 2020-01-15 DIAGNOSIS — K4020 Bilateral inguinal hernia, without obstruction or gangrene, not specified as recurrent: Secondary | ICD-10-CM | POA: Diagnosis not present

## 2020-01-15 DIAGNOSIS — K5730 Diverticulosis of large intestine without perforation or abscess without bleeding: Secondary | ICD-10-CM | POA: Diagnosis not present

## 2020-01-15 DIAGNOSIS — N400 Enlarged prostate without lower urinary tract symptoms: Secondary | ICD-10-CM | POA: Diagnosis not present

## 2020-01-15 MED ORDER — TECHNETIUM TC 99M MEDRONATE IV KIT
21.3000 | PACK | Freq: Once | INTRAVENOUS | Status: AC | PRN
  Administered 2020-01-15: 21.3 via INTRAVENOUS

## 2020-01-15 NOTE — Telephone Encounter (Signed)
Returned patient's phone call, spoke with patient 

## 2020-01-16 ENCOUNTER — Telehealth: Payer: Self-pay | Admitting: *Deleted

## 2020-01-16 DIAGNOSIS — H25811 Combined forms of age-related cataract, right eye: Secondary | ICD-10-CM | POA: Diagnosis not present

## 2020-01-16 DIAGNOSIS — H25011 Cortical age-related cataract, right eye: Secondary | ICD-10-CM | POA: Diagnosis not present

## 2020-01-16 DIAGNOSIS — H2511 Age-related nuclear cataract, right eye: Secondary | ICD-10-CM | POA: Diagnosis not present

## 2020-01-16 NOTE — Telephone Encounter (Signed)
CALLED PATIENT TO GIVE PRE-SEED APPTS., SPOKE WITH PATIENT AND HE IS AWARE OF THESE DATES

## 2020-01-18 ENCOUNTER — Encounter: Payer: Self-pay | Admitting: Medical Oncology

## 2020-01-18 NOTE — Progress Notes (Signed)
Introduced myself to patient as the prostate nurse navigator and discussed my role. He is most interest in brachytherapy but would like some time to make his final decision. He is scheduled for scans on 10/18 and the results, may determine his treatment.I gave him my business card and asked him to reach out o me with questions or concerns. He voiced understanding.

## 2020-01-18 NOTE — Progress Notes (Signed)
Left message to introduce myself as the prostate nurse navigator. Per Ashlyn, PA, scans were negative for metastatic disease, and he can move forward with treatment. I gave him my contact information and asked him to call me so I can discuss my role and help to identify any barriers to his care.

## 2020-01-23 DIAGNOSIS — F4323 Adjustment disorder with mixed anxiety and depressed mood: Secondary | ICD-10-CM | POA: Diagnosis not present

## 2020-01-24 ENCOUNTER — Telehealth: Payer: Self-pay | Admitting: *Deleted

## 2020-01-24 NOTE — Telephone Encounter (Signed)
RETURNED PATIENT'S PHONE CALL, LVM FOR A RETURN CALL 

## 2020-01-24 NOTE — Telephone Encounter (Signed)
RETURNED PATIENT'S PHONE CALL, SPOKE WITH PATIENT. ?

## 2020-01-30 DIAGNOSIS — F4323 Adjustment disorder with mixed anxiety and depressed mood: Secondary | ICD-10-CM | POA: Diagnosis not present

## 2020-02-13 DIAGNOSIS — F4323 Adjustment disorder with mixed anxiety and depressed mood: Secondary | ICD-10-CM | POA: Diagnosis not present

## 2020-02-15 ENCOUNTER — Telehealth: Payer: Self-pay | Admitting: *Deleted

## 2020-02-15 NOTE — Telephone Encounter (Signed)
CALLED PATIENT TO REMIND OF PRE-SEED APPTS. FOR 02-16-20, SPOKE WITH PATIENT AND HE IS AWARE OF THESE APPTS.

## 2020-02-16 ENCOUNTER — Ambulatory Visit
Admission: RE | Admit: 2020-02-16 | Discharge: 2020-02-16 | Disposition: A | Discharge status: Home / Self Care | Payer: Medicare Other | Source: Ambulatory Visit | Attending: Radiation Oncology | Admitting: Radiation Oncology

## 2020-02-16 ENCOUNTER — Ambulatory Visit
Admission: RE | Admit: 2020-02-16 | Discharge: 2020-02-16 | Disposition: A | Discharge status: Home / Self Care | Payer: Medicare Other | Source: Ambulatory Visit | Attending: Urology | Admitting: Urology

## 2020-02-16 ENCOUNTER — Other Ambulatory Visit: Payer: Self-pay

## 2020-02-16 DIAGNOSIS — C61 Malignant neoplasm of prostate: Secondary | ICD-10-CM | POA: Diagnosis not present

## 2020-02-18 NOTE — Progress Notes (Signed)
°  Radiation Oncology         [phone] ________________________________  Name: Lucion Dilger MRN: [id number]  Date: 02/16/2020  DOB: July 07, 1945  SIMULATION AND TREATMENT PLANNING NOTE PUBIC ARCH STUDY  JQ:[id number], Marjory Lies, MD  Davis Gourd*  DIAGNOSIS:  Oncology History   No history exists.      ICD-10-CM   1. Malignant neoplasm of prostate ([place])  C61     COMPLEX SIMULATION:  The patient presented today for evaluation for possible prostate seed implant. He was brought to the radiation planning suite and placed supine on the CT couch. A 3-dimensional image study set was obtained in upload to the planning computer. There, on each axial slice, I contoured the prostate gland. Then, using three-dimensional radiation planning tools I reconstructed the prostate in view of the structures from the transperineal needle pathway to assess for possible pubic arch interference. In doing so, I did not appreciate any pubic arch interference. Also, the patient's prostate volume was estimated based on the drawn structure. The volume was 43 cc.  Given the pubic arch appearance and prostate volume, patient remains a good candidate to proceed with prostate seed implant. Today, he freely provided informed written consent to proceed.    PLAN: The patient will undergo prostate seed implant.   ________________________________  Sheral Apley. Tammi Klippel, M.D.

## 2020-02-20 DIAGNOSIS — F4323 Adjustment disorder with mixed anxiety and depressed mood: Secondary | ICD-10-CM | POA: Diagnosis not present

## 2020-02-27 DIAGNOSIS — F4323 Adjustment disorder with mixed anxiety and depressed mood: Secondary | ICD-10-CM | POA: Diagnosis not present

## 2020-03-05 DIAGNOSIS — F4323 Adjustment disorder with mixed anxiety and depressed mood: Secondary | ICD-10-CM | POA: Diagnosis not present

## 2020-03-08 ENCOUNTER — Telehealth: Payer: Self-pay | Admitting: Internal Medicine

## 2020-03-08 NOTE — Telephone Encounter (Signed)
Pharmacy, can you please comment on how long Eliquis can be held for upcoming procedure?  Thank you! 

## 2020-03-08 NOTE — Telephone Encounter (Signed)
Patient with diagnosis of atrial fibrillation on Elqiuis for anticoagulation.    Procedure: Breach therapy OAR Date of procedure: TBD    CHA2DS2-VASc Score = 3  This indicates a 3.2% annual risk of stroke. The patient's score is based upon: CHF History: Yes HTN History: No Diabetes History: No Stroke History: No Vascular Disease History: No Age Score: 2 Gender Score: 0   CrCl 77.6 Platelet count 178  Per office protocol, patient can hold Eliquis for 3 days prior to procedure.   Patient will not need bridging with Lovenox (enoxaparin) around procedure.  Patient should restart Eliquis on the evening of procedure or day after, at discretion of procedure MD

## 2020-03-08 NOTE — Telephone Encounter (Signed)
   [facility] Pre-operative Risk Assessment    HEARTCARE STAFF: - Please ensure there is not already an duplicate clearance open for this procedure. - Under Visit Info/Reason for Call, type in Other and utilize the format Clearance MM/DD/YY or Clearance TBD. Do not use dashes or single digits. - If request is for dental extraction, please clarify the # of teeth to be extracted.  Request for surgical clearance:  1. What type of surgery is being performed? Breach therapy OAR  2. When is this surgery scheduled? TBD    3. What type of clearance is required (medical clearance vs. Pharmacy clearance to hold med vs. Both)? Both   Are there any medications that need to be held prior to surgery and how long?  ELIQUIS 5 MG TABS tablet [[id number] Held 3 day pior  4. Practice name and name of physician performing surgery? [facility]   5. What is the office phone number? [place]   7.   What is the office fax number? [phone]  8.   Anesthesia type (None, local, MAC, general) ? General    Shana A Stovall 03/08/2020, 2:49 PM  _________________________________________________________________   (provider comments below)

## 2020-03-11 ENCOUNTER — Ambulatory Visit (INDEPENDENT_AMBULATORY_CARE_PROVIDER_SITE_OTHER): Payer: Medicare Other

## 2020-03-11 DIAGNOSIS — I4729 Other ventricular tachycardia: Secondary | ICD-10-CM

## 2020-03-11 DIAGNOSIS — I472 Ventricular tachycardia: Secondary | ICD-10-CM | POA: Diagnosis not present

## 2020-03-11 LAB — CUP PACEART REMOTE DEVICE CHECK
Battery Remaining Longevity: 14 mo
Battery Remaining Percentage: 17 %
Battery Voltage: 2.75 V
Brady Statistic AP VP Percent: 11 %
Brady Statistic AP VS Percent: 82 %
Brady Statistic AS VP Percent: 1 %
Brady Statistic AS VS Percent: 6 %
Brady Statistic RA Percent Paced: 90 %
Brady Statistic RV Percent Paced: 12 %
Date Time Interrogation Session: 20211213021757
HighPow Impedance: 54 Ohm
HighPow Impedance: 54 Ohm
Implantable Lead Implant Date: 20101123
Implantable Lead Implant Date: 20101123
Implantable Lead Location: 753859
Implantable Lead Location: 753860
Implantable Lead Model: 7121
Implantable Pulse Generator Implant Date: 20141203
Lead Channel Impedance Value: 430 Ohm
Lead Channel Impedance Value: 440 Ohm
Lead Channel Pacing Threshold Amplitude: 0.75 V
Lead Channel Pacing Threshold Amplitude: 1 V
Lead Channel Pacing Threshold Pulse Width: 0.5 ms
Lead Channel Pacing Threshold Pulse Width: 0.5 ms
Lead Channel Sensing Intrinsic Amplitude: 3 mV
Lead Channel Sensing Intrinsic Amplitude: 9.9 mV
Lead Channel Setting Pacing Amplitude: 2.5 V
Lead Channel Setting Pacing Amplitude: 2.5 V
Lead Channel Setting Pacing Pulse Width: 0.5 ms
Lead Channel Setting Sensing Sensitivity: 0.5 mV
Pulse Gen Serial Number: 7136426

## 2020-03-11 NOTE — Telephone Encounter (Signed)
   Primary Cardiologist: No primary care provider on file.  Chart reviewed as part of pre-operative protocol coverage. Patient was contacted 03/11/2020 in reference to pre-operative risk assessment for pending surgery as outlined below.  Robert Ochoa was last seen on 11/21/19 by Truitt Merle.  Since that day, Robert Ochoa has done well. He walks 4 Xu daily without angina.   Per our clinical pharmacist: Patient with diagnosis of atrial fibrillation on Elqiuis for anticoagulation.    Procedure: Breach therapy OAR Date of procedure: TBD    CHA2DS2-VASc Score = 3  This indicates a 3.2% annual risk of stroke. The patient's score is based upon: CHF History: Yes HTN History: No Diabetes History: No Stroke History: No Vascular Disease History: No Age Score: 2 Gender Score: 0   CrCl 77.6 Platelet count 178  Per office protocol, patient can hold Eliquis for 3 days prior to procedure.   Patient will not need bridging with Lovenox (enoxaparin) around procedure.  Patient should restart Eliquis on the evening of procedure or day after, at discretion of procedure   Therefore, based on ACC/AHA guidelines, the patient would be at acceptable risk for the planned procedure without further cardiovascular testing.   The patient was advised that if he develops new symptoms prior to surgery to contact our office to arrange for a follow-up visit, and he verbalized understanding.  I will route this recommendation to the requesting party via Epic fax function and remove from pre-op pool. Please call with questions.  Tami Lin Devani Odonnel, PA 03/11/2020, 3:31 PM

## 2020-03-12 DIAGNOSIS — Z23 Encounter for immunization: Secondary | ICD-10-CM | POA: Diagnosis not present

## 2020-03-12 DIAGNOSIS — K439 Ventral hernia without obstruction or gangrene: Secondary | ICD-10-CM | POA: Diagnosis not present

## 2020-03-12 DIAGNOSIS — R109 Unspecified abdominal pain: Secondary | ICD-10-CM | POA: Diagnosis not present

## 2020-03-19 ENCOUNTER — Telehealth: Payer: Self-pay | Admitting: *Deleted

## 2020-03-19 ENCOUNTER — Other Ambulatory Visit: Payer: Self-pay | Admitting: Urology

## 2020-03-19 DIAGNOSIS — F4323 Adjustment disorder with mixed anxiety and depressed mood: Secondary | ICD-10-CM | POA: Diagnosis not present

## 2020-03-19 DIAGNOSIS — C61 Malignant neoplasm of prostate: Secondary | ICD-10-CM

## 2020-03-19 NOTE — Telephone Encounter (Signed)
RETURNED PATIENT'S PHONE CALL, LVM FOR A RETURN CALL 

## 2020-03-26 NOTE — Progress Notes (Signed)
Remote ICD transmission.   

## 2020-04-03 ENCOUNTER — Telehealth: Payer: Self-pay | Admitting: *Deleted

## 2020-04-03 NOTE — Telephone Encounter (Signed)
CALLED PATIENT TO ASK ABOUT GETTING A CHEST X-RAY, PATIENT AGREED TO DO THIS ON 04-09-20, PATIENT TO REPORT TO [facility] ADMITTING, SPOKE WITH PATIENT AND HE VERIFIED UNDERSTANDING THIS APPT.

## 2020-04-04 DIAGNOSIS — Z85828 Personal history of other malignant neoplasm of skin: Secondary | ICD-10-CM | POA: Diagnosis not present

## 2020-04-04 DIAGNOSIS — L570 Actinic keratosis: Secondary | ICD-10-CM | POA: Diagnosis not present

## 2020-04-04 DIAGNOSIS — L720 Epidermal cyst: Secondary | ICD-10-CM | POA: Diagnosis not present

## 2020-04-04 DIAGNOSIS — D1801 Hemangioma of skin and subcutaneous tissue: Secondary | ICD-10-CM | POA: Diagnosis not present

## 2020-04-04 DIAGNOSIS — L821 Other seborrheic keratosis: Secondary | ICD-10-CM | POA: Diagnosis not present

## 2020-04-04 DIAGNOSIS — L812 Freckles: Secondary | ICD-10-CM | POA: Diagnosis not present

## 2020-04-09 ENCOUNTER — Encounter (HOSPITAL_COMMUNITY)
Admission: RE | Admit: 2020-04-09 | Discharge: 2020-04-09 | Disposition: A | Discharge status: Home / Self Care | Payer: Medicare Other | Source: Ambulatory Visit | Attending: Urology | Admitting: Urology

## 2020-04-09 ENCOUNTER — Other Ambulatory Visit: Payer: Self-pay

## 2020-04-09 ENCOUNTER — Ambulatory Visit (HOSPITAL_COMMUNITY)
Admission: RE | Admit: 2020-04-09 | Discharge: 2020-04-09 | Disposition: A | Discharge status: Home / Self Care | Payer: Medicare Other | Source: Ambulatory Visit | Attending: Urology | Admitting: Urology

## 2020-04-09 DIAGNOSIS — C61 Malignant neoplasm of prostate: Secondary | ICD-10-CM | POA: Diagnosis not present

## 2020-04-09 DIAGNOSIS — F4322 Adjustment disorder with anxiety: Secondary | ICD-10-CM | POA: Diagnosis not present

## 2020-04-16 DIAGNOSIS — F4322 Adjustment disorder with anxiety: Secondary | ICD-10-CM | POA: Diagnosis not present

## 2020-04-22 ENCOUNTER — Other Ambulatory Visit: Payer: Self-pay | Admitting: Internal Medicine

## 2020-04-23 DIAGNOSIS — F4322 Adjustment disorder with anxiety: Secondary | ICD-10-CM | POA: Diagnosis not present

## 2020-04-25 ENCOUNTER — Encounter (HOSPITAL_BASED_OUTPATIENT_CLINIC_OR_DEPARTMENT_OTHER): Payer: Self-pay | Admitting: Urology

## 2020-04-25 ENCOUNTER — Encounter: Payer: Self-pay | Admitting: Internal Medicine

## 2020-04-25 ENCOUNTER — Other Ambulatory Visit: Payer: Self-pay

## 2020-04-25 NOTE — Progress Notes (Addendum)
Spoke w/ via phone for pre-op interview---pt Lab needs dos---- none              Lab results------has lab appt 04-26-2020 1300 pm for cbc, cmet, pt ptt COVID test ------04-26-2020 1155 Arrive at -------1015 am 04-30-2020 NPO after MN NO Solid Food.  Clear liquids from MN until---915 am then npo Medications to take morning of surgery -----amiodarone, metorpolol tartrate Diabetic medication -----n/a Patient Special Instructions -----none Pre-Op special Istructions -----fleets enema am of surgery Patient verbalized understanding of instructions that were given at this phone interview. Patient denies shortness of breath, chest pain, fever, cough at this phone interview.  Anesthesia : sepsis 2013, icd/pacemaker combo hx arvd, afib has cardiac clearance  Chart to Janett Billow zanetto pa for review  PCP:  Dr London Pepper Cardiologist : dr Jeneen Rinks allred cardiac clearance note angela [facility] 03-11-2020 epic Chest x-ray : 04-09-2020 epic EKG :04-09-2020 epic Echo : 06-07-2019 epic Stress test:none Cardiac Cath : none Activity level: walks 4 [place] several days days a week Sleep Study/ CPAP :none Fasting Blood Sugar :      / Checks Blood Sugar -- times a day:  n/a Blood Thinner/ Instructions /Last Dose: note to stop eliquis 03-08-2020 3 days before surgery ( pt aware) krisitn [facility] epic ASA / Instructions/ Last Dose : n/a  Cardiac device orders sent to [facility], device orders dr allred received and placed on pt chart

## 2020-04-25 NOTE — Progress Notes (Signed)
[facility] DEVICE PROGRAMMING  Patient Information: Name:  Amour Cutrone  DOB:  1945/11/19  MRN:  [id number]    Planned Procedure: prostate radioactive seed implant  Surgeon: dr Harrell Gave winter  Date of Procedure: 04-30-2020  Cautery will be used.  Position during surgery: lithotomy   Please send documentation back to:  [facility] (Fax # [phone])   Hilda Blades, RN  04/25/2020 2:37 PM   Device Information:  Clinic EP Physician:  Thompson Grayer, MD   Device Type:  Defibrillator Manufacturer and Phone #:  St. Jude/Abbott: [phone] Pacemaker Dependent?:  No. Date of Last Device Check:  03/11/20 Normal Device Function?:  Yes.    Electrophysiologist's Recommendations:   Have magnet available.  Provide continuous ECG monitoring when magnet is used or reprogramming is to be performed.   Procedure should not interfere with device function.  No device programming or magnet placement needed.  Per Device Clinic Standing Orders, York Ram, RN  5:33 PM 04/25/2020

## 2020-04-26 ENCOUNTER — Other Ambulatory Visit (HOSPITAL_COMMUNITY)
Admission: RE | Admit: 2020-04-26 | Discharge: 2020-04-26 | Disposition: A | Discharge status: Home / Self Care | Payer: Medicare Other | Source: Ambulatory Visit | Attending: Urology | Admitting: Urology

## 2020-04-26 ENCOUNTER — Encounter (HOSPITAL_COMMUNITY)
Admission: RE | Admit: 2020-04-26 | Discharge: 2020-04-26 | Disposition: A | Discharge status: Home / Self Care | Payer: Medicare Other | Source: Ambulatory Visit | Attending: Urology | Admitting: Urology

## 2020-04-26 ENCOUNTER — Telehealth: Payer: Self-pay | Admitting: *Deleted

## 2020-04-26 DIAGNOSIS — Z20822 Contact with and (suspected) exposure to covid-19: Secondary | ICD-10-CM | POA: Insufficient documentation

## 2020-04-26 DIAGNOSIS — Z01812 Encounter for preprocedural laboratory examination: Secondary | ICD-10-CM | POA: Diagnosis not present

## 2020-04-26 LAB — COMPREHENSIVE METABOLIC PANEL
ALT: 21 U/L (ref 0–44)
AST: 24 U/L (ref 15–41)
Albumin: 3.7 g/dL (ref 3.5–5.0)
Alkaline Phosphatase: 62 U/L (ref 38–126)
Anion gap: 10 (ref 5–15)
BUN: 20 mg/dL (ref 8–23)
CO2: 21 mmol/L — ABNORMAL LOW (ref 22–32)
Calcium: 9.3 mg/dL (ref 8.9–10.3)
Chloride: 106 mmol/L (ref 98–111)
Creatinine, Ser: 1 mg/dL (ref 0.61–1.24)
GFR, Estimated: >60 mL/min (ref >60)
Glucose, Bld: 104 mg/dL — ABNORMAL HIGH (ref 70–99)
Potassium: 4.7 mmol/L (ref 3.5–5.1)
Sodium: 137 mmol/L (ref 135–145)
Total Bilirubin: 1.1 mg/dL (ref 0.3–1.2)
Total Protein: 6.6 g/dL (ref 6.5–8.1)

## 2020-04-26 LAB — PROTIME-INR
INR: 1.2 (ref 0.8–1.2)
Prothrombin Time: 14.5 seconds (ref 11.4–15.2)

## 2020-04-26 LAB — APTT: aPTT: 39 seconds — ABNORMAL HIGH (ref 24–36)

## 2020-04-26 LAB — SARS CORONAVIRUS 2 (TAT 6-24 HRS): SARS Coronavirus 2: NEGATIVE

## 2020-04-26 NOTE — Telephone Encounter (Signed)
CALLED PATIENT TO REMIND OF LAB AND COVID TESTING FOR 04-26-20, SPOKE WITH PATIENT AND HE IS AWARE OF THESE APPTS.

## 2020-04-29 ENCOUNTER — Telehealth: Payer: Self-pay | Admitting: *Deleted

## 2020-04-29 NOTE — Progress Notes (Signed)
  Radiation Oncology         [phone] ________________________________  Name: Malike Foglio MRN: [id number]  Date: 04/29/2020  DOB: 26-Aug-1945       Prostate Seed Implant  CC:London Pepper, MD  No ref. provider found  DIAGNOSIS:   75 y.o. gentleman with Stage T1c adenocarcinoma of the prostate with Gleason score of 4+3, and PSA of 5.98.  PROCEDURE: Insertion of radioactive I-125 seeds into the prostate gland.  RADIATION DOSE: 145 Gy, definitive therapy.  TECHNIQUE: Jakeb Lamping was brought to the operating room with the urologist. He was placed in the dorsolithotomy position. He was catheterized and a rectal tube was inserted. The perineum was shaved, prepped and draped. The ultrasound probe was then introduced into the rectum to see the prostate gland.  TREATMENT DEVICE: A needle grid was attached to the ultrasound probe stand and anchor needles were placed.  3D PLANNING: The prostate was imaged in 3D using a sagittal sweep of the prostate probe. These images were transferred to the planning computer. There, the prostate, urethra and rectum were defined on each axial reconstructed image. Then, the software created an optimized 3D plan and a few seed positions were adjusted. The quality of the plan was reviewed using [facility] information for the target and the following two organs at risk:  Urethra and Rectum.  Then the accepted plan was printed and handed off to the radiation therapist.  Under my supervision, the custom loading of the seeds and spacers was carried out and loaded into sealed vicryl sleeves.  These pre-loaded needles were then placed into the needle holder.[name]  PROSTATE VOLUME STUDY:  Using transrectal ultrasound the volume of the prostate was verified to be 49.5 cc.  SPECIAL TREATMENT PROCEDURE/SUPERVISION AND HANDLING: The pre-loaded needles were then delivered under sagittal guidance. A total of 20 needles were used to deposit 74 seeds in the prostate gland. The individual  seed activity was 0.500 mCi.  SpaceOAR:  Yes  COMPLEX SIMULATION: At the end of the procedure, an anterior radiograph of the pelvis was obtained to document seed positioning and count. Cystoscopy was performed to check the urethra and bladder.  MICRODOSIMETRY: At the end of the procedure, the patient was emitting 0.12 mR/hr at 1 meter. Accordingly, he was considered safe for hospital discharge.  PLAN: The patient will return to the radiation oncology clinic for post implant CT dosimetry in three weeks.   ________________________________  Sheral Apley Tammi Klippel, M.D.

## 2020-04-29 NOTE — Telephone Encounter (Signed)
Called patient to remind of procedure for 04-30-20, spoke with patient and he is aware of this procedure

## 2020-04-30 ENCOUNTER — Encounter (HOSPITAL_BASED_OUTPATIENT_CLINIC_OR_DEPARTMENT_OTHER): Payer: Self-pay | Admitting: Urology

## 2020-04-30 ENCOUNTER — Encounter (HOSPITAL_BASED_OUTPATIENT_CLINIC_OR_DEPARTMENT_OTHER)
Admission: RE | Disposition: A | Discharge status: Home / Self Care | Payer: Self-pay | Source: Home / Self Care | Attending: Urology

## 2020-04-30 ENCOUNTER — Ambulatory Visit (HOSPITAL_COMMUNITY): Payer: Medicare Other

## 2020-04-30 ENCOUNTER — Ambulatory Visit (HOSPITAL_BASED_OUTPATIENT_CLINIC_OR_DEPARTMENT_OTHER): Payer: Medicare Other | Admitting: Physician Assistant

## 2020-04-30 ENCOUNTER — Ambulatory Visit (HOSPITAL_BASED_OUTPATIENT_CLINIC_OR_DEPARTMENT_OTHER)
Admission: RE | Admit: 2020-04-30 | Discharge: 2020-04-30 | Disposition: A | Discharge status: Home / Self Care | Payer: Medicare Other | Attending: Urology | Admitting: Urology

## 2020-04-30 DIAGNOSIS — Z85828 Personal history of other malignant neoplasm of skin: Secondary | ICD-10-CM | POA: Diagnosis not present

## 2020-04-30 DIAGNOSIS — N529 Male erectile dysfunction, unspecified: Secondary | ICD-10-CM | POA: Insufficient documentation

## 2020-04-30 DIAGNOSIS — Z79899 Other long term (current) drug therapy: Secondary | ICD-10-CM | POA: Diagnosis not present

## 2020-04-30 DIAGNOSIS — I472 Ventricular tachycardia: Secondary | ICD-10-CM | POA: Diagnosis not present

## 2020-04-30 DIAGNOSIS — I4891 Unspecified atrial fibrillation: Secondary | ICD-10-CM | POA: Diagnosis not present

## 2020-04-30 DIAGNOSIS — C61 Malignant neoplasm of prostate: Secondary | ICD-10-CM | POA: Diagnosis present

## 2020-04-30 DIAGNOSIS — I499 Cardiac arrhythmia, unspecified: Secondary | ICD-10-CM | POA: Diagnosis not present

## 2020-04-30 DIAGNOSIS — Z95810 Presence of automatic (implantable) cardiac defibrillator: Secondary | ICD-10-CM | POA: Diagnosis not present

## 2020-04-30 DIAGNOSIS — Z7901 Long term (current) use of anticoagulants: Secondary | ICD-10-CM | POA: Insufficient documentation

## 2020-04-30 HISTORY — PX: RADIOACTIVE SEED IMPLANT: SHX5150

## 2020-04-30 HISTORY — DX: Other cardiomyopathies: I42.8

## 2020-04-30 HISTORY — DX: Presence of spectacles and contact lenses: Z97.3

## 2020-04-30 HISTORY — PX: SPACE OAR INSTILLATION: SHX6769

## 2020-04-30 SURGERY — INSERTION, RADIATION SOURCE, PROSTATE
Anesthesia: General

## 2020-04-30 MED ORDER — PHENYLEPHRINE 40 MCG/ML (10ML) SYRINGE FOR IV PUSH (FOR BLOOD PRESSURE SUPPORT)
PREFILLED_SYRINGE | INTRAVENOUS | Status: DC | PRN
  Administered 2020-04-30: 80 ug via INTRAVENOUS
  Administered 2020-04-30: 120 ug via INTRAVENOUS
  Administered 2020-04-30 (×3): 80 ug via INTRAVENOUS

## 2020-04-30 MED ORDER — SODIUM CHLORIDE 0.9 % IV SOLN
INTRAVENOUS | Status: AC | PRN
  Administered 2020-04-30: 1000 mL

## 2020-04-30 MED ORDER — ONDANSETRON HCL 4 MG/2ML IJ SOLN
INTRAMUSCULAR | Status: DC | PRN
  Administered 2020-04-30: 4 mg via INTRAVENOUS

## 2020-04-30 MED ORDER — EPHEDRINE SULFATE-NACL 50-0.9 MG/10ML-% IV SOSY
PREFILLED_SYRINGE | INTRAVENOUS | Status: DC | PRN
  Administered 2020-04-30 (×6): 10 mg via INTRAVENOUS

## 2020-04-30 MED ORDER — ONDANSETRON HCL 4 MG/2ML IJ SOLN
INTRAMUSCULAR | Status: AC
  Filled 2020-04-30: qty 2

## 2020-04-30 MED ORDER — CIPROFLOXACIN IN D5W 400 MG/200ML IV SOLN
INTRAVENOUS | Status: AC
  Filled 2020-04-30: qty 200

## 2020-04-30 MED ORDER — LIDOCAINE HCL (PF) 2 % IJ SOLN
INTRAMUSCULAR | Status: AC
  Filled 2020-04-30: qty 5

## 2020-04-30 MED ORDER — LACTATED RINGERS IV SOLN: INTRAVENOUS | Status: DC

## 2020-04-30 MED ORDER — CIPROFLOXACIN IN D5W 400 MG/200ML IV SOLN
400.0000 mg | INTRAVENOUS | Status: AC
  Administered 2020-04-30: 400 mg via INTRAVENOUS

## 2020-04-30 MED ORDER — PHENYLEPHRINE 40 MCG/ML (10ML) SYRINGE FOR IV PUSH (FOR BLOOD PRESSURE SUPPORT)
PREFILLED_SYRINGE | INTRAVENOUS | Status: AC
  Filled 2020-04-30: qty 10

## 2020-04-30 MED ORDER — DEXAMETHASONE SODIUM PHOSPHATE 10 MG/ML IJ SOLN
INTRAMUSCULAR | Status: AC
  Filled 2020-04-30: qty 1

## 2020-04-30 MED ORDER — IOHEXOL 300 MG/ML  SOLN
INTRAMUSCULAR | Status: DC | PRN
  Administered 2020-04-30: 7 mL

## 2020-04-30 MED ORDER — SODIUM CHLORIDE (PF) 0.9 % IJ SOLN
INTRAMUSCULAR | Status: DC | PRN
  Administered 2020-04-30: 10 mL

## 2020-04-30 MED ORDER — FLEET ENEMA 7-19 GM/118ML RE ENEM: 1.0000 | ENEMA | Freq: Once | RECTAL | Status: DC

## 2020-04-30 MED ORDER — PROPOFOL 10 MG/ML IV BOLUS
INTRAVENOUS | Status: DC | PRN
  Administered 2020-04-30: 150 mg via INTRAVENOUS
  Administered 2020-04-30: 50 mg via INTRAVENOUS

## 2020-04-30 MED ORDER — EPHEDRINE 5 MG/ML INJ
INTRAVENOUS | Status: AC
  Filled 2020-04-30: qty 10

## 2020-04-30 MED ORDER — LIDOCAINE HCL (CARDIAC) PF 100 MG/5ML IV SOSY
PREFILLED_SYRINGE | INTRAVENOUS | Status: DC | PRN
  Administered 2020-04-30: 60 mg via INTRAVENOUS

## 2020-04-30 MED ORDER — STERILE WATER FOR IRRIGATION IR SOLN
Status: DC | PRN
  Administered 2020-04-30: 3 mL

## 2020-04-30 MED ORDER — FENTANYL CITRATE (PF) 100 MCG/2ML IJ SOLN
INTRAMUSCULAR | Status: DC | PRN
  Administered 2020-04-30: 100 ug via INTRAVENOUS

## 2020-04-30 MED ORDER — DEXAMETHASONE SODIUM PHOSPHATE 4 MG/ML IJ SOLN
INTRAMUSCULAR | Status: DC | PRN
  Administered 2020-04-30: 10 mg via INTRAVENOUS

## 2020-04-30 MED ORDER — TRAMADOL HCL 50 MG PO TABS: 50.0000 mg | ORAL_TABLET | Freq: Four times a day (QID) | ORAL | 0 refills | Status: DC | PRN

## 2020-04-30 MED ORDER — FENTANYL CITRATE (PF) 100 MCG/2ML IJ SOLN
INTRAMUSCULAR | Status: AC
  Filled 2020-04-30: qty 2

## 2020-04-30 SURGICAL SUPPLY — 37 items
AGX100 ×1 IMPLANT
BAG DRN RND TRDRP ANRFLXCHMBR (UROLOGICAL SUPPLIES) ×1
BAG URINE DRAIN 2000ML AR STRL (UROLOGICAL SUPPLIES) ×2 IMPLANT
BLADE CLIPPER SENSICLIP SURGIC (BLADE) ×2 IMPLANT
CATH FOLEY 2WAY SLVR  5CC 16FR (CATHETERS) ×2
CATH FOLEY 2WAY SLVR 5CC 16FR (CATHETERS) ×1 IMPLANT
CATH ROBINSON RED A/P 16FR (CATHETERS) IMPLANT
CATH ROBINSON RED A/P 20FR (CATHETERS) ×2 IMPLANT
CLOTH BEACON ORANGE TIMEOUT ST (SAFETY) ×2 IMPLANT
CNTNR URN SCR LID CUP LEK RST (MISCELLANEOUS) ×2 IMPLANT
CONT SPEC 4OZ STRL OR WHT (MISCELLANEOUS) ×4
COVER BACK TABLE 60X90IN (DRAPES) ×2 IMPLANT
COVER MAYO STAND STRL (DRAPES) ×2 IMPLANT
DRSG TEGADERM 4X4.75 (GAUZE/BANDAGES/DRESSINGS) ×3 IMPLANT
DRSG TEGADERM 8X12 (GAUZE/BANDAGES/DRESSINGS) ×4 IMPLANT
GAUZE SPONGE 4X4 12PLY STRL LF (GAUZE/BANDAGES/DRESSINGS) ×1 IMPLANT
GLOVE BIO SURGEON STRL SZ7.5 (GLOVE) ×3 IMPLANT
GLOVE BIO SURGEON STRL SZ8 (GLOVE) IMPLANT
GLOVE SURG ENC MOIS LTX SZ6.5 (GLOVE) ×1 IMPLANT
GLOVE SURG ORTHO 8.5 STRL (GLOVE) ×3 IMPLANT
GLOVE SURG SS PI 6.5 STRL IVOR (GLOVE) ×1 IMPLANT
GOWN STRL REUS W/TWL LRG LVL3 (GOWN DISPOSABLE) ×2 IMPLANT
GOWN STRL REUS W/TWL XL LVL3 (GOWN DISPOSABLE) ×3 IMPLANT
HOLDER FOLEY CATH W/STRAP (MISCELLANEOUS) IMPLANT
IMPL SPACEOAR VUE SYSTEM (Spacer) IMPLANT
IMPLANT SPACEOAR VUE SYSTEM (Spacer) ×2 IMPLANT
IV NS 1000ML (IV SOLUTION) ×2
IV NS 1000ML BAXH (IV SOLUTION) ×1 IMPLANT
KIT TURNOVER CYSTO (KITS) ×2 IMPLANT
MARKER SKIN DUAL TIP RULER LAB (MISCELLANEOUS) ×2 IMPLANT
PACK CYSTO (CUSTOM PROCEDURE TRAY) ×2 IMPLANT
SURGILUBE 2OZ TUBE FLIPTOP (MISCELLANEOUS) ×1 IMPLANT
SUT BONE WAX W31G (SUTURE) IMPLANT
SYR 10ML LL (SYRINGE) ×3 IMPLANT
TOWEL OR 17X26 10 PK STRL BLUE (TOWEL DISPOSABLE) ×2 IMPLANT
UNDERPAD 30X36 HEAVY ABSORB (UNDERPADS AND DIAPERS) ×4 IMPLANT
WATER STERILE IRR 500ML POUR (IV SOLUTION) ×2 IMPLANT

## 2020-04-30 NOTE — Anesthesia Preprocedure Evaluation (Signed)
Anesthesia Evaluation  Patient identified by MRN, date of birth, ID band Patient awake    Reviewed: Allergy & Precautions, Patient's Chart, lab work & pertinent test results  Airway Mallampati: II  TM Distance: >3 FB Neck ROM: Full    Dental no notable dental hx. (+) Teeth Intact   Pulmonary neg pulmonary ROS,    Pulmonary exam normal breath sounds clear to auscultation       Cardiovascular Pt. on home beta blockers Normal cardiovascular exam+ dysrhythmias Atrial Fibrillation and Ventricular Tachycardia + Cardiac Defibrillator  Rhythm:Regular Rate:Normal  06/07/19 Echo  1. Left ventricular ejection fraction, by estimation, is 55 to 60%. The  left ventricle has normal function. The left ventricle has no regional  wall motion abnormalities. Left ventricular diastolic parameters are  consistent with Grade I diastolic  dysfunction (impaired relaxation).    Neuro/Psych negative neurological ROS  negative psych ROS   GI/Hepatic negative GI ROS, Neg liver ROS,   Endo/Other  negative endocrine ROS  Renal/GU negative Renal ROS   Prostate CA    Musculoskeletal negative musculoskeletal ROS (+)   Abdominal (+) + obese,   Peds  Hematology Lab Results      Component                Value               Date                      WBC                      5.6                 11/21/2019                HGB                      15.9                11/21/2019                HCT                      47.4                11/21/2019                MCV                      87                  11/21/2019                PLT                      178                 11/21/2019              Anesthesia Other Findings   Reproductive/Obstetrics                             Anesthesia Physical Anesthesia Plan  ASA: III  Anesthesia Plan: General   Post-op Pain Management:    Induction: Intravenous  PONV Risk  Score and Plan: Treatment may vary due to age or medical  condition, Ondansetron and Dexamethasone  Airway Management Planned: LMA  Additional Equipment: None  Intra-op Plan:   Post-operative Plan:   Informed Consent:     Dental advisory given  Plan Discussed with:   Anesthesia Plan Comments:         Anesthesia Quick Evaluation

## 2020-04-30 NOTE — Discharge Instructions (Signed)
°  Post Anesthesia Home Care Instructions  Activity: Get plenty of rest for the remainder of the day. A responsible adult should stay with you for 24 hours following the procedure.  For the next 24 hours, DO NOT: -Drive a car -Paediatric nurse -Drink alcoholic beverages -Take any medication unless instructed by your physician -Make any legal decisions or sign important papers.  Meals: Start with liquid foods such as gelatin or soup. Progress to regular foods as tolerated. Avoid greasy, spicy, heavy foods. If nausea and/or vomiting occur, drink only clear liquids until the nausea and/or vomiting subsides. Call your physician if vomiting continues.  Special Instructions/Symptoms: Your throat may feel dry or sore from the anesthesia or the breathing tube placed in your throat during surgery. If this causes discomfort, gargle with warm salt water. The discomfort should disappear within 24 hours.  I1.Post Anesthesia Home Care Instructions  Activity: Get plenty of rest for the remainder of the day. A responsible adult should stay with you for 24 hours following the procedure.  For the next 24 hours, DO NOT: -Drive a car -Paediatric nurse -Drink alcoholic beverages -Take any medication unless instructed by your physician -Make any legal decisions or sign important papers.  Meals: Start with liquid foods such as gelatin or soup. Progress to regular foods as tolerated. Avoid greasy, spicy, heavy foods. If nausea and/or vomiting occur, drink only clear liquids until the nausea and/or vomiting subsides. Call your physician if vomiting continues.  Special Instructions/Symptoms: Your throat may feel dry or sore from the anesthesia or the breathing tube placed in your throat during surgery. If this causes discomfort, gargle with warm salt water. The discomfort should disappear within 24 hours.  If you had a scopolamine patch placed behind your ear for the management of post- operative nausea  and/or vomiting:  1. The medication in the patch is effective for 72 hours, after which it should be removed.  Wrap patch in a tissue and discard in the trash. Wash hands thoroughly with soap and water. 2. You may remove the patch earlier than 72 hours if you experience unpleasant side effects which may include dry mouth, dizziness or visual disturbances. 3. Avoid touching the patch. Wash your hands with soap and water after contact with the patch.

## 2020-04-30 NOTE — Op Note (Signed)
PATIENT:  Robert Ochoa  PRE-OPERATIVE DIAGNOSIS:  Adenocarcinoma of the prostate  POST-OPERATIVE DIAGNOSIS:  Same  PROCEDURE:  1. I-125 radioactive seed implantation 2. Cystoscopy  3. Placement of SpaceOAR  SURGEON:  Ellison Hughs, MD  Radiation oncologist: Tyler Pita, MD  ANESTHESIA:  General  EBL:  Minimal  DRAINS: None  INDICATION: Orvill Coulthard is a 75 year old male with multifocal grade 3 prostate cancer that was diagnosed on 12/15/2019.  The patient has elected to proceed with brachytherapy as primary treatment of his intermediate risk prostate cancer.  The risk, benefits and alternatives of the above procedures was discussed in detail.  He voices understanding and wishes to proceed.  Description of procedure: After informed consent the patient was brought to the major OR, placed on the table and administered general anesthesia. He was then moved to the modified lithotomy position with his perineum perpendicular to the floor. His perineum and genitalia were then sterilely prepped. An official timeout was then performed. A 16 French Foley catheter was then placed in the bladder and filled with dilute contrast, a rectal tube was placed in the rectum and the transrectal ultrasound probe was placed in the rectum and affixed to the stand. He was then sterilely draped.  Real time ultrasonography was used along with the seed planning software. This was used to develop the seed plan including the number of needles as well as number of seeds required for complete and adequate coverage. Real-time ultrasonography was then used along with the previously developed plan and the Nucletron device to implant a total of 74 seeds using 20 needles. This proceeded without difficulty or complication.   I then proceeded with placement of SpaceOAR by introducing a needle with the bevel angled inferiorly approximately 2 cm superior to the anus. This was angled downward and under direct ultrasound  was placed within the space between the prostatic capsule and rectum. This was confirmed with a small amount of sterile saline injected and this was performed under direct ultrasound. I then attached the SpaceOAR to the needle and injected this in the space between the prostate and rectum with good placement noted.  A Foley catheter was then removed as well as the transrectal ultrasound probe and rectal probe. Flexible cystoscopy was then performed using the 16 French flexible scope which revealed a normal urethra throughout its length down to the sphincter which appeared intact. The prostatic urethra revealed bilobar hypertrophy but no evidence of obstruction, seeds, spacers or lesions. The bladder was then entered and fully and systematically inspected. The ureteral orifices were noted to be of normal configuration and position. The mucosa revealed no evidence of tumors. There were also no stones identified within the bladder. I noted no seeds or spacers on the floor of the bladder and retroflexion of the scope revealed no seeds protruding from the base of the prostate.  The cystoscope was then removed and the patient was awakened and taken to recovery room in stable and satisfactory condition. He tolerated procedure well and there were no intraoperative complications.  Plan: Follow-up on 05/28/2020 to assess his urinary symptoms

## 2020-04-30 NOTE — Progress Notes (Signed)
Spoke with jessica zanetto pa ok to proceed. 

## 2020-04-30 NOTE — Anesthesia Postprocedure Evaluation (Signed)
Anesthesia Post Note  Patient: Robert Ochoa  Procedure(s) Performed: RADIOACTIVE SEED IMPLANT/BRACHYTHERAPY IMPLANT (N/A ) SPACE OAR INSTILLATION (N/A )     Patient location during evaluation: PACU Anesthesia Type: General Level of consciousness: awake and alert Pain management: pain level controlled Vital Signs Assessment: post-procedure vital signs reviewed and stable Respiratory status: spontaneous breathing, nonlabored ventilation, respiratory function stable and patient connected to nasal cannula oxygen Cardiovascular status: blood pressure returned to baseline and stable Postop Assessment: no apparent nausea or vomiting Anesthetic complications: no   No complications documented.  Last Vitals:  Vitals:   04/30/20 1445 04/30/20 1446  BP:  98/71  Pulse: 61 65  Resp: (!) 23 13  Temp:  36.7 C  SpO2: 97% 94%    Last Pain:  Vitals:   04/30/20 1446  TempSrc: Oral  PainSc:                  [place]et [place]

## 2020-04-30 NOTE — Transfer of Care (Signed)
Immediate Anesthesia Transfer of Care Note  Patient: Robert Ochoa  Procedure(s) Performed: RADIOACTIVE SEED IMPLANT/BRACHYTHERAPY IMPLANT (N/A ) SPACE OAR INSTILLATION (N/A )  Patient Location: PACU  Anesthesia Type:General  Level of Consciousness: drowsy  Airway & Oxygen Therapy: Patient Spontanous Breathing and Patient connected to face mask oxygen  Post-op Assessment: Report given to RN and Post -op Vital signs reviewed and stable  Post vital signs: Reviewed and stable  Last Vitals:  Vitals Value Taken Time  BP 94/67 04/30/20 1356  Temp    Pulse 60 04/30/20 1359  Resp 15 04/30/20 1359  SpO2 95 % 04/30/20 1359  Vitals shown include unvalidated device data.  Last Pain:  Vitals:   04/30/20 1304  TempSrc: Oral  PainSc:       Patients Stated Pain Goal: 5 (25/42/70 [id number])  Complications: No complications documented.

## 2020-04-30 NOTE — H&P (Signed)
PRE-OP H&P  CC: I have prostate cancer.  HPI: Robert Ochoa is a 75 year-old male established patient who is here evaluation for treatment of prostate cancer.  His prostate cancer was diagnosed 12/15/2019. He does have the pathology report from his biopsy. His cancer was diagnosed by Dr. Lovena Neighbours. His PSA at his time of diagnosis was 5.98.   He does have problems with erectile dysfunction. He has not recently had unwanted weight loss. He is not having pain in new locations.   The patient is here today brachytherapy seed placement after his prostate biopsy on 12/15/2019 revealed grade 3 prostate cancer in multiple cores. Staging CT and bone scan were negative for signs of metastatic prostate cancer.  He is urinating without difficulty and denies residual hematuria or blood per rectum.   PMHx of cardiac arrhythmia and right ventricular dysplasia --has pacemaker and defibrillator Cardiolgist--James Allred, MD      ALLERGIES: No Allergies    MEDICATIONS: Hydrocodone-Acetaminophen 5 mg-325 mg tablet 1 tablet PO As Directed Take one hour prior to your scheduled prostate biopsy  Metoprolol Tartrate 75 mg tablet 1 tablet PO BID  Amiodarone Hcl 100 mg tablet Oral  Calcium/Magnesium/Zinc  Eliquis 5 mg tablet Oral  Fish Oil  Lisinopril 10 mg tablet Oral  Sildenafil Citrate 20 mg tablet 2-5 tablets PRN  Sildenafil Citrate 20 MG Oral Tablet 0 Oral  Tadalafil 5 mg tablet 1 tablet PO PRN  Vitamin C     Notes: medications verified with patient   GU PSH: Prostate Needle Biopsy - 12/15/2019       PSH Notes: Heart Surgery, Tonsillectomy, Appendectomy  -Pacemaker with defrillator   NON-GU PSH: Appendectomy - 2008 Pacemaker placement Remove Tonsils - 2008 Surgical Pathology, Gross And Microscopic Examination For Prostate Needle - 12/15/2019     GU PMH: Elevated PSA - 12/15/2019, Elevated prostate specific antigen (PSA), - 2017 Other male ED - 10/26/2019 ED due to arterial insufficiency, Erectile  dysfunction due to arterial insufficiency - 2017 BPH w/o LUTS, Benign prostatic hypertrophy without lower urinary tract symptoms - 2016      PMH Notes:  -Cardiac arrhythmia    NON-GU PMH: Psychogenic ED - 09/14/2019 Encounter for general adult medical examination without abnormal findings, Encounter for preventive health examination - 2016    FAMILY HISTORY: Acute Myocardial Infarction - Father Family Health Status Number - Runs In Family Hypertension - Father Osteoporosis - Mother   SOCIAL HISTORY: Marital Status: Single Preferred Language: English; Ethnicity: Not Hispanic Or Latino; Race: White Current Smoking Status: Patient has never smoked.   Tobacco Use Assessment Completed: Used Tobacco in last 30 days? Drinks 2 drinks per week.  Does not drink caffeine.     Notes: Marital History - Single, Occupation:, Death In The Family Mother, Death In The Family Father, Never Smoked   REVIEW OF SYSTEMS:    GU Review Male:   Patient denies frequent urination, hard to postpone urination, burning/ pain with urination, get up at night to urinate, leakage of urine, stream starts and stops, trouble starting your stream, have to strain to urinate , erection problems, and penile pain.  Gastrointestinal (Upper):   Patient denies nausea, vomiting, and indigestion/ heartburn.  Gastrointestinal (Lower):   Patient denies diarrhea and constipation.  Constitutional:   Patient denies fever, night sweats, weight loss, and fatigue.  Skin:   Patient denies skin rash/ lesion and itching.  Eyes:   Patient denies blurred vision and double vision.  Ears/ Nose/ Throat:   Patient denies  sore throat and sinus problems.  Hematologic/Lymphatic:   Patient denies swollen glands and easy bruising.  Cardiovascular:   Patient denies leg swelling and chest pains.  Respiratory:   Patient denies cough and shortness of breath.  Endocrine:   Patient denies excessive thirst.  Musculoskeletal:   Patient denies back pain and  joint pain.  Neurological:   Patient denies dizziness and headaches.  Psychologic:   Patient denies depression and anxiety.   Vitals:   04/25/20 1602 04/30/20 1042  BP:  127/80  Pulse:  70  Temp:  (!) 97.3 F (36.3 C)  Resp:  16  Height: 6\' 2"  (1.88 m) 6\' 2"  (1.88 m)  Weight: 89.8 kg 94.9 kg  SpO2:  98%  TempSrc:  Oral  BMI (Calculated): 25.41 26.86   Recent Results (from the past 2160 hour(s))  CUP PACEART REMOTE DEVICE CHECK     Status: None   Collection Time: 03/11/20  2:17 AM  Result Value Ref Range   Date Time Interrogation Session [phone]    Pulse Generator Manufacturer [employer]    Pulse Gen Model 2411-36C Ellipse DR    Pulse Gen Serial Number [id number]    Clinic Name [facility]    Implantable Pulse Generator Type Implantable Cardiac Defibulator    Implantable Pulse Generator Implant Date [id number]    Implantable Lead Manufacturer [facility]    Implantable Lead Model 1888TC Tendril ST Optim    Implantable Lead Serial Number [id number]    Implantable Lead Implant Date HA:[id number]    Implantable Lead Location Detail 1 APPENDAGE    Implantable Lead Location [id number]    Implantable Lead Manufacturer [facility]    Implantable Lead Model [id number] Durata    Implantable Lead Serial Number [id number]    Implantable Lead Implant Date HA:[id number]    Implantable Lead Location Detail 1 APEX    Implantable Lead Location [id number]    Lead Channel Setting Sensing Sensitivity 0.5 mV   Lead Channel Setting Sensing Adaptation Mode Adaptive Sensing    Lead Channel Setting Pacing Amplitude 2.5 V   Lead Channel Setting Pacing Pulse Width 0.5 ms   Lead Channel Setting Pacing Amplitude 2.5 V   Lead Channel Status NULL    Lead Channel Impedance Value 430 ohm   Lead Channel Sensing Intrinsic Amplitude 3.0 mV   Lead Channel Pacing Threshold Amplitude 1.0 V   Lead Channel Pacing Threshold Pulse Width 0.5 ms   Lead Channel Status NULL    Lead Channel Impedance Value 440 ohm   Lead Channel Sensing Intrinsic  Amplitude 9.9 mV   Lead Channel Pacing Threshold Amplitude 0.75 V   Lead Channel Pacing Threshold Pulse Width 0.5 ms   HighPow Impedance 54 ohm   HighPow Impedance 54 ohm   HighPow Imped Status NULL    HighPow Imped Status NULL    Battery Status MOS    Battery Remaining Longevity 14 mo   Battery Remaining Percentage 17.0 %   Battery Voltage 2.75 V   Brady Statistic RA Percent Paced 90.0 %   Brady Statistic RV Percent Paced 12.0 %   Brady Statistic AP VP Percent 11.0 %   Brady Statistic AS VP Percent 1.0 %   Brady Statistic AP VS Percent 82.0 %   Brady Statistic AS VS Percent 6.0 %  SARS CORONAVIRUS 2 (TAT 6-24 HRS) Nasopharyngeal Nasopharyngeal Swab     Status: None   Collection Time: 04/26/20 12:08 PM   Specimen: Nasopharyngeal Swab  Result Value Ref Range   SARS Coronavirus  2 NEGATIVE NEGATIVE    Comment: (NOTE) SARS-CoV-2 target nucleic acids are NOT DETECTED.  The SARS-CoV-2 RNA is generally detectable in upper and lower respiratory specimens during the acute phase of infection. Negative results do not preclude SARS-CoV-2 infection, do not rule out co-infections with other pathogens, and should not be used as the sole basis for treatment or other patient management decisions. Negative results must be combined with clinical observations, patient history, and epidemiological information. The expected result is Negative.  Fact Sheet for Patients: SugarRoll.be  Fact Sheet for Healthcare Providers: https://www.woods-mathews.com/  This test is not yet approved or cleared by the [place] FDA and  has been authorized for detection and/or diagnosis of SARS-CoV-2 by FDA under an Emergency Use Authorization (EUA). This EUA will remain  in effect (meaning this test can be used) for the duration of the COVID-19 declaration under Se ction 564(b)(1) of the Act, 21 U.S.C. section 360bbb-3(b)(1), unless the authorization is terminated  or revoked sooner.  Performed at [facility], [place] [address] [place], [place] [address]   Comprehensive metabolic panel     Status: Abnormal   Collection Time: 04/26/20  1:24 PM  Result Value Ref Range   Sodium 137 135 - 145 mmol/L   Potassium 4.7 3.5 - 5.1 mmol/L   Chloride 106 98 - 111 mmol/L   CO2 21 (L) 22 - 32 mmol/L   Glucose, Bld 104 (H) 70 - 99 mg/dL    Comment: Glucose reference range applies only to samples taken after fasting for at least 8 hours.   BUN 20 8 - 23 mg/dL   Creatinine, Ser 1.00 0.61 - 1.24 mg/dL   Calcium 9.3 8.9 - 10.3 mg/dL   Total Protein 6.6 6.5 - 8.1 g/dL   Albumin 3.7 3.5 - 5.0 g/dL   AST 24 15 - 41 U/L   ALT 21 0 - 44 U/L   Alkaline Phosphatase 62 38 - 126 U/L   Total Bilirubin 1.1 0.3 - 1.2 mg/dL   GFR, Estimated >60 >60 mL/min    Comment: (NOTE) Calculated using the CKD-EPI Creatinine Equation (2021)    Anion gap 10 5 - 15    Comment: Performed at [facility], [place] [address], [place], [place] [address]  Protime-INR     Status: None   Collection Time: 04/26/20  1:24 PM  Result Value Ref Range   Prothrombin Time 14.5 11.4 - 15.2 seconds   INR 1.2 0.8 - 1.2    Comment: (NOTE) INR goal varies based on device and disease states. Performed at [facility], [place] [address], [place], [place] [address]   APTT     Status: Abnormal   Collection Time: 04/26/20  1:24 PM  Result Value Ref Range   aPTT 39 (H) 24 - 36 seconds    Comment:        IF BASELINE aPTT IS ELEVATED, SUGGEST PATIENT RISK ASSESSMENT BE USED TO DETERMINE APPROPRIATE ANTICOAGULANT THERAPY. Performed at [facility], [place] [address] [place], [place] [address]    BONE SCAN CLINICAL DATA:  Prostate cancer, PSA 5.98 on 09/29/2019  EXAM: NUCLEAR MEDICINE WHOLE BODY BONE SCAN  TECHNIQUE: Whole body anterior and posterior images were obtained approximately 3 hours after intravenous injection of  radiopharmaceutical.  RADIOPHARMACEUTICALS:  21.3 mCi Technetium-56m MDP IV  COMPARISON:  None  Correlation: CT abdomen and pelvis 01/15/2020  FINDINGS: Minimal uptake at L5-S1 and knees, likely degenerative.  No worrisome sites of  abnormal tracer accumulation to suggest osseous metastatic disease.  Expected urinary tract and soft tissue distribution of tracer.  IMPRESSION: No scintigraphic evidence of osseous metastatic disease.   Electronically Signed   By: Lavonia Dana M.D.   On: 01/15/2020 18:06  CLINICAL DATA: New diagnosis prostate cancer.  EXAM: CT ABDOMEN AND PELVIS WITH CONTRAST  TECHNIQUE: Multidetector CT imaging of the abdomen and pelvis was performed using the standard protocol following bolus administration of intravenous contrast.  CONTRAST: 100 cc Omnipaque 300  COMPARISON: CT scan 07/01/2018  FINDINGS: Lower chest: Stable basilar scarring changes. No worrisome pulmonary lesions to suggest metastatic disease. The heart is normal in size. No pericardial effusion. The distal esophagus is grossly normal.  Hepatobiliary: No hepatic lesions to suggest metastatic disease. Small gallstones are suspected. No intra or extrahepatic biliary dilatation. Incidental pericaval lipoma noted involving the intrahepatic portion.  Pancreas: No mass, inflammation or ductal dilatation.  Spleen: Normal size. No focal lesions.  Adrenals/Urinary Tract: Adrenal glands and kidneys are unremarkable. No renal, ureteral or bladder calculi or mass.  Stomach/Bowel: The stomach, duodenum, small bowel and colon are grossly normal without oral contrast. No acute inflammatory changes, mass lesions or obstructive findings. Advanced colonic diverticulosis but no findings for acute diverticulitis.  Vascular/Lymphatic: The aorta is normal in caliber. Scattered atherosclerotic calcifications. No dissection. The branch vessels are patent. The major venous structures are  patent.  No mesenteric or retroperitoneal mass or lymphadenopathy. No pelvic lymphadenopathy. No inguinal lymphadenopathy.  Reproductive: The prostate gland is mildly enlarged. Seminal vesicles appear normal.  Other: Bilateral inguinal hernias containing fat, left larger than right.  Musculoskeletal: No worrisome lytic or sclerotic bone lesions to suggest metastatic disease. There are bilateral pars defects at L5 with a grade 1 spondylolisthesis and associated advanced degenerative disc disease and facet disease at L5-S1. Mild-to-moderate bilateral hip joint degenerative changes.  IMPRESSION: 1. Mild prostate gland enlargement but no pelvic adenopathy or other metastatic findings. 2. Advanced colonic diverticulosis without findings for acute diverticulitis. 3. Bilateral inguinal hernias containing fat, left larger than right.   Electronically Signed By: Marijo Sanes M.D. On: 01/15/2020 11:12  MULTI-SYSTEM PHYSICAL EXAMINATION:    Constitutional: Well-nourished. No physical deformities. Normally developed. Good grooming.  Neurologic / Psychiatric: Oriented to time, oriented to place, oriented to person. No depression, no anxiety, no agitation.  Musculoskeletal: Normal gait and station of head and neck.     Complexity of Data:  Lab Test Review:   PSA  Records Review:   Pathology Reports, Previous Patient Records   09/29/19 09/22/19 11/09/16 08/13/15 06/26/14 06/13/13 05/21/12 01/08/12  PSA  Total PSA 5.98 ng/mL 6.88 ng/mL 3.25 ng/mL 2.89  2.63  2.62  2.71  2.73    Assessment:  75 year old male with grade 3 prostate cancer  Plan:  -The patient was counseled about the natural history of prostate cancer and the standard treatment options that are available for prostate cancer. It was explained to him how his age and life expectancy, clinical stage, Gleason score, and PSA affect his prognosis, the decision to proceed with additional staging studies, as well as how that  information influences recommended treatment strategies. We discussed the roles for active surveillance, radiation therapy, surgical therapy, androgen deprivation, as well as ablative therapy options for the treatment of prostate cancer as appropriate to his individual cancer situation. We discussed the risks and benefits of these options with regard to their impact on cancer control and also in terms of potential adverse events, complications, and impact  on quality of life particularly related to urinary and sexual function. The patient was encouraged to ask questions throughout the discussion today and all questions were answered to his stated satisfaction. In addition, the patient was provided with and/or directed to appropriate resources and literature for further education about prostate cancer and treatment options.   The patient has decided to proceed with cystoscopy, brachytherapy seed and SpaceOAR placement as primary treatment of his risk prostate cancer.  The risks, benefits and alternatives of the aforementioned procedures was discussed in detail.  Risks include, bur are not limited to worsening LUTS, erectile dysfunction, rectal irritation, urethral stricture formation, fistula formation, cancer recurrence, MI, CVA, PE, DVT and the inherent risk of general anesthesia.  He voices understanding and wishes to proceed.

## 2020-04-30 NOTE — Anesthesia Procedure Notes (Signed)
Procedure Name: LMA Insertion Date/Time: 04/30/2020 12:42 PM Performed by: Lieutenant Diego, CRNA Pre-anesthesia Checklist: Patient identified, Emergency Drugs available, Suction available and Patient being monitored Patient Re-evaluated:Patient Re-evaluated prior to induction Oxygen Delivery Method: Circle system utilized Preoxygenation: Pre-oxygenation with 100% oxygen Induction Type: IV induction Ventilation: Mask ventilation without difficulty LMA: LMA inserted LMA Size: 5.0 Number of attempts: 1 Placement Confirmation: positive ETCO2 and breath sounds checked- equal and bilateral Tube secured with: Tape Dental Injury: Teeth and Oropharynx as per pre-operative assessment

## 2020-05-01 ENCOUNTER — Encounter (HOSPITAL_BASED_OUTPATIENT_CLINIC_OR_DEPARTMENT_OTHER): Payer: Self-pay | Admitting: Urology

## 2020-05-02 ENCOUNTER — Encounter: Payer: Self-pay | Admitting: Medical Oncology

## 2020-05-06 DIAGNOSIS — F4322 Adjustment disorder with anxiety: Secondary | ICD-10-CM | POA: Diagnosis not present

## 2020-05-17 ENCOUNTER — Ambulatory Visit (INDEPENDENT_AMBULATORY_CARE_PROVIDER_SITE_OTHER): Payer: Medicare Other | Admitting: Internal Medicine

## 2020-05-17 ENCOUNTER — Encounter: Payer: Self-pay | Admitting: Internal Medicine

## 2020-05-17 ENCOUNTER — Other Ambulatory Visit: Payer: Self-pay

## 2020-05-17 VITALS — BP 124/74 | HR 60 | Ht 74.0 in | Wt 206.2 lb

## 2020-05-17 DIAGNOSIS — Q208 Other congenital malformations of cardiac chambers and connections: Secondary | ICD-10-CM | POA: Diagnosis not present

## 2020-05-17 DIAGNOSIS — I472 Ventricular tachycardia: Secondary | ICD-10-CM

## 2020-05-17 DIAGNOSIS — D6869 Other thrombophilia: Secondary | ICD-10-CM | POA: Diagnosis not present

## 2020-05-17 DIAGNOSIS — I480 Paroxysmal atrial fibrillation: Secondary | ICD-10-CM

## 2020-05-17 DIAGNOSIS — R001 Bradycardia, unspecified: Secondary | ICD-10-CM | POA: Diagnosis not present

## 2020-05-17 DIAGNOSIS — I4729 Other ventricular tachycardia: Secondary | ICD-10-CM

## 2020-05-17 LAB — CUP PACEART INCLINIC DEVICE CHECK
Battery Remaining Longevity: 12 mo
Brady Statistic RA Percent Paced: 90 %
Brady Statistic RV Percent Paced: 12 %
Date Time Interrogation Session: 20220218140718
HighPow Impedance: 55.6559
Implantable Lead Implant Date: 20101123
Implantable Lead Implant Date: 20101123
Implantable Lead Location: 753859
Implantable Lead Location: 753860
Implantable Lead Model: 7121
Implantable Pulse Generator Implant Date: 20141203
Lead Channel Impedance Value: 425 Ohm
Lead Channel Impedance Value: 437.5 Ohm
Lead Channel Pacing Threshold Amplitude: 0.75 V
Lead Channel Pacing Threshold Amplitude: 1 V
Lead Channel Pacing Threshold Amplitude: 1 V
Lead Channel Pacing Threshold Pulse Width: 0.5 ms
Lead Channel Pacing Threshold Pulse Width: 0.5 ms
Lead Channel Pacing Threshold Pulse Width: 0.5 ms
Lead Channel Sensing Intrinsic Amplitude: 2.5 mV
Lead Channel Sensing Intrinsic Amplitude: 9.3 mV
Lead Channel Setting Pacing Amplitude: 2.5 V
Lead Channel Setting Pacing Amplitude: 2.5 V
Lead Channel Setting Pacing Pulse Width: 0.5 ms
Lead Channel Setting Sensing Sensitivity: 0.5 mV
Pulse Gen Serial Number: 7136426

## 2020-05-17 NOTE — Patient Instructions (Addendum)
Medication Instructions:  Your physician recommends that you continue on your current medications as directed. Please refer to the Current Medication list given to you today.  Labwork: LFT's, TSH  Testing/Procedures: None ordered.  Follow-Up: Your physician wants you to follow-up in: 6 months with    Legrand Como "Oda Kilts, PA-C  One year with Dr. Rayann Heman.   You will receive a reminder letter in the mail two months in advance. If you don't receive a letter, please call our office to schedule the follow-up appointment.  Remote monitoring is used to monitor your ICD from home. This monitoring reduces the number of office visits required to check your device to one time per year. It allows [place] to keep an eye on the functioning of your device to ensure it is working properly. You are scheduled for a device check from home on 06/10/20. You may send your transmission at any time that day. If you have a wireless device, the transmission will be sent automatically. After your physician reviews your transmission, you will receive a postcard with your next transmission date.  Any Other Special Instructions Will Be Listed Below (If Applicable).  If you need a refill on your cardiac medications before your next appointment, please call your pharmacy.

## 2020-05-17 NOTE — Progress Notes (Signed)
PCP: London Pepper, MD   Primary EP: Dr Rayann Heman  Robert Ochoa is a 75 y.o. male who presents today for routine electrophysiology followup.  Since last being seen in our clinic, the patient reports doing very well.  Today, he denies symptoms of palpitations, chest pain, shortness of breath,  lower extremity edema, dizziness, presyncope, syncope, or ICD shocks.  The patient is otherwise without complaint today.   Past Medical History:  Diagnosis Date  . Arrhythmogenic right ventricular dysplas ([place])   . Arrhythmogenic right ventricular dysplasia ([facility])    Hx of VT s/p dual-chamber St. Jude ICD. Cardiac MRI 2010 showed moderately dilated RV with hypokinesis, LV EF 47%. EF 50-55% 2010 but down to 45% 05/2011. Cath 01/2009 without significant CAD.  [name] Atrial fibrillation or flutter    Paroxysmal. Started on Pradaxa 05/2011.  . Cardiac defibrillator in place   . LV dysfunction    per echo 10/2011; EF 35%  // Echo 2/18: EF 40-45, diff HK, Gr 1 DD, mild dilated aortic root, mod LAE, mod RVE, mild reduced RVSF, mod RAE  . Prostate cancer ([place])   . Septic shock(785.52) 10-27-11 cause unknown   with associated respiratory/renal failure/PAF/bradycardia  . Ventricular tachycardia ([place])    ICD discharges 05/2011 for this & AF-RVR - started on amiodarone then.  . Wears glasses    Past Surgical History:  Procedure Laterality Date  . APPENDECTOMY  2002  . CARDIAC DEFIBRILLATOR PLACEMENT  02/19/09    St,. Jude   . IMPLANTABLE CARDIOVERTER DEFIBRILLATOR (ICD) GENERATOR CHANGE N/A 03/01/2013   Procedure: ICD GENERATOR CHANGE;  Surgeon: [name], MD;  Location: [facility] CATH LAB;  Service: Cardiovascular;  Laterality: N/A;  . IMPLANTABLE CARDIOVERTER DEFIBRILLATOR GENERATOR CHANGE  03/01/13   for premature battery depletetion.  Now has a SJM Ellipse DR device placed by Dr Robert Comes  . PROSTATE BIOPSY  11/2019  . RADIOACTIVE SEED IMPLANT N/A 04/30/2020   Procedure: RADIOACTIVE SEED IMPLANT/BRACHYTHERAPY IMPLANT;   Surgeon: Ceasar Mons, MD;  Location: [facility];  Service: Urology;  Laterality: N/A;  . SPACE OAR INSTILLATION N/A 04/30/2020   Procedure: SPACE OAR INSTILLATION;  Surgeon: Ceasar Mons, MD;  Location: [facility];  Service: Urology;  Laterality: N/A;    ROS- all systems are reviewed and negative except as per HPI above  Current Outpatient Medications  Medication Sig Dispense Refill  . amiodarone (PACERONE) 200 MG tablet TAKE 1/2 TABLET(100 MG) BY MOUTH DAILY 45 tablet 2  . Calcium-Magnesium-Zinc (CAL-MAG-ZINC PO) Take 2 tablets by mouth daily.    [name] ELIQUIS 5 MG TABS tablet TAKE 1 TABLET BY MOUTH TWICE DAILY 60 tablet 10  . ibuprofen (ADVIL) 200 MG tablet Take 200 mg by mouth every 6 (six) hours as needed for mild pain.    [name] lisinopril (ZESTRIL) 10 MG tablet TAKE 2 TABLETS BY MOUTH EVERY DAY 180 tablet 3  . metoprolol tartrate (LOPRESSOR) 25 MG tablet TAKE 3 TABLETS BY MOUTH TWICE DAILY 540 tablet 3  . Omega-3 Fatty Acids (FISH OIL) 1000 MG CAPS Take 3 capsules by mouth daily.    [name] oxybutynin (DITROPAN-XL) 5 MG 24 hr tablet Take 5 mg by mouth daily.    . sildenafil (VIAGRA) 100 MG tablet Take 1 tablet (100 mg total) by mouth daily as needed for erectile dysfunction. 10 tablet 0  . tamsulosin (FLOMAX) 0.4 MG CAPS capsule Take 0.4 mg by mouth daily.    . vitamin C (ASCORBIC ACID) 500 MG tablet  Take 1,000 mg by mouth daily.     No current facility-administered medications for this visit.    Physical Exam: Vitals:   05/17/20 1140  BP: 124/74  Pulse: 60  SpO2: 94%  Weight: 206 lb 3.2 oz (93.5 kg)  Height: 6\' 2"  (1.88 m)    GEN- The patient is well appearing, alert and oriented x 3 today.   Head- normocephalic, atraumatic Eyes-  Sclera clear, conjunctiva pink Ears- hearing intact Oropharynx- clear Lungs- Clear to ausculation bilaterally, normal work of breathing Chest- ICD pocket is well healed Heart- Regular rate and rhythm,  no murmurs, rubs or gallops, PMI not laterally displaced GI- soft, NT, ND, + BS Extremities- no clubbing, cyanosis, or edema  ICD interrogation- reviewed in detail today,  See PACEART report  ekg tracing ordered today is personally reviewed and shows sinus rhythm  Wt Readings from Last 3 Encounters:  05/17/20 206 lb 3.2 oz (93.5 kg)  04/30/20 209 lb 4.8 oz (94.9 kg)  04/26/20 200 lb (90.7 kg)    Assessment and Plan:  1. ARVD euvolemic today EF is preserved Stable on an appropriate medical regimen Normal ICD function See Pace Art report No changes today he is not device dependant today not followed in ICM device clinic  2. VT Currently controlled with low dose amiodarone we will need to follow him closely on this medicine to avoid toxicity PFTs from 8/21 reviewed.  He follows with Dr Elsworth Soho Tsh, lfts today We discussed his ILD and risks of amiodarone.  He is very clear that he understands risks and wishes to continue amiodarone 100mg  daily at this time  3. Atrial fibrillation Well controlled On eliquis for stroke prevention  4. ILD Follows with Dr Elsworth Soho  5. HL Obtain fasting lipids today  Risks, benefits and potential toxicities for medications prescribed and/or refilled reviewed with patient today.   Return to see EP PA in 6 months I will see in a year  Thompson Grayer MD, [facility] 05/17/2020 11:58 AM

## 2020-05-18 LAB — HEPATIC FUNCTION PANEL
ALT: 14 IU/L (ref 0–44)
AST: 19 IU/L (ref 0–40)
Albumin: 4 g/dL (ref 3.7–4.7)
Alkaline Phosphatase: 71 IU/L (ref 44–121)
Bilirubin Total: 0.9 mg/dL (ref 0.0–1.2)
Bilirubin, Direct: 0.23 mg/dL (ref 0.00–0.40)
Total Protein: 6.6 g/dL (ref 6.0–8.5)

## 2020-05-18 LAB — TSH: TSH: 0.723 u[IU]/mL (ref 0.450–4.500)

## 2020-05-21 DIAGNOSIS — F4322 Adjustment disorder with anxiety: Secondary | ICD-10-CM | POA: Diagnosis not present

## 2020-05-22 ENCOUNTER — Telehealth: Payer: Self-pay | Admitting: *Deleted

## 2020-05-22 NOTE — Telephone Encounter (Signed)
CALLED PATIENT TO REMIND OF POST SEED APPTS. FOR 05-23-20, LVM FOR A RETURN CALL

## 2020-05-22 NOTE — Progress Notes (Signed)
Patient is here today for a follow-up post seed appointment. Patient reports: Dysuria No Hematuria No Nocturia x3 Leakage sometime Urgency sometime Emptying bladder sometime Stream weak Push or strain to start stream no Bowels none Next appointment with urologist March 1,2022 IPPS 21 Vitals:   05/23/20 1359  BP: 107/79  Pulse: 60  Resp: 18  SpO2: 100%  Weight: 95 kg

## 2020-05-23 ENCOUNTER — Ambulatory Visit
Admission: RE | Admit: 2020-05-23 | Discharge: 2020-05-23 | Disposition: A | Discharge status: Home / Self Care | Payer: Medicare Other | Source: Ambulatory Visit | Attending: Radiation Oncology | Admitting: Radiation Oncology

## 2020-05-23 ENCOUNTER — Encounter: Payer: Self-pay | Admitting: Urology

## 2020-05-23 ENCOUNTER — Other Ambulatory Visit: Payer: Self-pay

## 2020-05-23 ENCOUNTER — Ambulatory Visit
Admission: RE | Admit: 2020-05-23 | Discharge: 2020-05-23 | Disposition: A | Discharge status: Home / Self Care | Payer: Medicare Other | Source: Ambulatory Visit | Attending: Urology | Admitting: Urology

## 2020-05-23 VITALS — BP 107/79 | HR 60 | Resp 18 | Wt 209.5 lb

## 2020-05-23 DIAGNOSIS — C61 Malignant neoplasm of prostate: Secondary | ICD-10-CM

## 2020-05-23 DIAGNOSIS — R3911 Hesitancy of micturition: Secondary | ICD-10-CM | POA: Insufficient documentation

## 2020-05-23 DIAGNOSIS — Z79899 Other long term (current) drug therapy: Secondary | ICD-10-CM | POA: Insufficient documentation

## 2020-05-23 DIAGNOSIS — Z7901 Long term (current) use of anticoagulants: Secondary | ICD-10-CM | POA: Insufficient documentation

## 2020-05-23 DIAGNOSIS — R350 Frequency of micturition: Secondary | ICD-10-CM | POA: Insufficient documentation

## 2020-05-23 NOTE — Progress Notes (Signed)
Radiation Oncology         [phone] ________________________________  Name: Lehman Whiteley MRN: [id number]  Date: 05/23/2020  DOB: [birth year]  Post-Seed Follow-Up Visit Note  CC: London Pepper, MD  Davis Gourd*  Diagnosis:   75 y.o. gentleman with Stage T1c adenocarcinoma of the prostate with Gleason score of 4+3, and PSA of 5.98.    ICD-10-CM   1. Malignant neoplasm of prostate (HCC)  C61     Interval Since Last Radiation:  3.5 weeks 04/30/20:  Insertion of radioactive I-125 seeds into the prostate gland; 145 Gy, definitive therapy with placement of SpaceOAR gel.  Narrative:  The patient returns today for routine follow-up.  He is complaining of increased urinary frequency and urinary hesitation symptoms. He filled out a questionnaire regarding urinary function today providing and overall IPSS score of 21 characterizing his symptoms as severe with nocturia x3, weak stream, particularly in the evenings with intermittency, increased urgency, frequency and feelings of incomplete emptying.  He has continued taking Flomax daily as prescribed and specifically denies dysuria or gross hematuria.  His pre-implant score was 6. He denies any abdominal pain, nausea, vomiting, diarrhea or constipation.  He has noticed that his bowels are a little more loose on occasion but no outright diarrhea.  He reports a healthy appetite and is maintaining his weight.  He denies any significant fatigue and overall, is pleased with his progress to date.  ALLERGIES:  has No Known Allergies.  Meds: Current Outpatient Medications  Medication Sig Dispense Refill  . amiodarone (PACERONE) 200 MG tablet TAKE 1/2 TABLET(100 MG) BY MOUTH DAILY 45 tablet 2  . Calcium-Magnesium-Zinc (CAL-MAG-ZINC PO) Take 2 tablets by mouth daily.    [name] ELIQUIS 5 MG TABS tablet TAKE 1 TABLET BY MOUTH TWICE DAILY 60 tablet 10  . ibuprofen (ADVIL) 200 MG tablet Take 200 mg by mouth every 6 (six) hours as needed for mild pain.     [name] lisinopril (ZESTRIL) 10 MG tablet TAKE 2 TABLETS BY MOUTH EVERY DAY 180 tablet 3  . metoprolol tartrate (LOPRESSOR) 25 MG tablet TAKE 3 TABLETS BY MOUTH TWICE DAILY 540 tablet 3  . Omega-3 Fatty Acids (FISH OIL) 1000 MG CAPS Take 3 capsules by mouth daily.    [name] oxybutynin (DITROPAN-XL) 5 MG 24 hr tablet Take 5 mg by mouth daily.    . sildenafil (VIAGRA) 100 MG tablet Take 1 tablet (100 mg total) by mouth daily as needed for erectile dysfunction. 10 tablet 0  . tamsulosin (FLOMAX) 0.4 MG CAPS capsule Take 0.4 mg by mouth daily.    . vitamin C (ASCORBIC ACID) 500 MG tablet Take 1,000 mg by mouth daily.     No current facility-administered medications for this encounter.    Physical Findings: In general this is a well appearing Caucasian male in no acute distress. He's alert and oriented x4 and appropriate throughout the examination. Cardiopulmonary assessment is negative for acute distress and he exhibits normal effort.   Lab Findings: Lab Results  Component Value Date   WBC 5.6 11/21/2019   HGB 15.9 11/21/2019   HCT 47.4 11/21/2019   MCV 87 11/21/2019   PLT 178 11/21/2019    Radiographic Findings:  Patient underwent CT imaging in our clinic for post implant dosimetry. The CT will be reviewed by Dr. Tammi Klippel to confirm there is an adequate distribution of radioactive seeds throughout the prostate gland and ensure that there are no seeds in or near the rectum. We suspect the final radiation  plan and dosimetry will show appropriate coverage of the prostate gland. He understands that we will call and inform him of any unexpected findings on further review of his imaging and dosimetry.  Impression/Plan: 75 y.o. gentleman with Stage T1c adenocarcinoma of the prostate with Gleason score of 4+3, and PSA of 5.98. The patient is recovering from the effects of radiation. His urinary symptoms should gradually improve over the next 4-6 months. We talked about this today. He is encouraged by his  improvement already and is otherwise pleased with his outcome. We also talked about long-term follow-up for prostate cancer following seed implant. He understands that ongoing PSA determinations and digital rectal exams will help perform surveillance to rule out disease recurrence. He has a follow up appointment scheduled with Jiles Crocker, NP at 8 AM on 05/28/2020 and will see Dr. Lovena Neighbours in 07/2020. He understands what to expect with his PSA measures. Patient was also educated today about some of the long-term effects from radiation including a small risk for rectal bleeding and possibly erectile dysfunction. We talked about some of the general management approaches to these potential complications. However, I did encourage the patient to contact our office or return at any point if he has questions or concerns related to his previous radiation and prostate cancer.    Nicholos Johns, PA-C

## 2020-05-23 NOTE — Progress Notes (Signed)
  Radiation Oncology         [phone] ________________________________  Name: Robert Ochoa MRN: [id number]  Date: 05/23/2020  DOB: March 20, 1946  COMPLEX SIMULATION NOTE  NARRATIVE:  The patient was brought to the [facility] today following prostate seed implantation approximately one month ago.  Identity was confirmed.  All relevant records and images related to the planned course of therapy were reviewed.  Then, the patient was set-up supine.  CT images were obtained.  The CT images were loaded into the planning software.  Then the prostate and rectum were contoured.  Treatment planning then occurred.  The implanted iodine 125 seeds were identified by the physics staff for projection of radiation distribution  I have requested : 3D Simulation  I have requested a DVH of the following structures: Prostate and rectum.    ________________________________  Sheral Apley Tammi Klippel, M.D.

## 2020-05-28 DIAGNOSIS — F4322 Adjustment disorder with anxiety: Secondary | ICD-10-CM | POA: Diagnosis not present

## 2020-05-30 ENCOUNTER — Ambulatory Visit
Admission: RE | Admit: 2020-05-30 | Discharge: 2020-05-30 | Disposition: A | Discharge status: Home / Self Care | Payer: Medicare Other | Source: Ambulatory Visit | Attending: Radiation Oncology | Admitting: Radiation Oncology

## 2020-05-30 ENCOUNTER — Encounter: Payer: Self-pay | Admitting: Radiation Oncology

## 2020-05-30 DIAGNOSIS — C61 Malignant neoplasm of prostate: Secondary | ICD-10-CM | POA: Diagnosis present

## 2020-06-04 NOTE — Progress Notes (Signed)
  Radiation Oncology         [phone]) [phone] ________________________________  Name: Robert Ochoa MRN: [id number]  Date: 05/30/2020  DOB: [birth year]  3D Planning Note   Prostate Brachytherapy Post-Implant Dosimetry  Diagnosis: 75 y.o. gentleman with Stage T1c adenocarcinoma of the prostate with Gleason score of 4+3, and PSA of 5.98.  Narrative: On a previous date, Robert Ochoa returned following prostate seed implantation for post implant planning. He underwent CT scan complex simulation to delineate the three-dimensional structures of the pelvis and demonstrate the radiation distribution.  Since that time, the seed localization, and complex isodose planning with dose volume histograms have now been completed.  Results:   Prostate Coverage - The dose of radiation delivered to the 90% or more of the prostate gland (D90) was 107.8% of the prescription dose. This exceeds our goal of greater than 90%. Rectal Sparing - The volume of rectal tissue receiving the prescription dose or higher was 0.0 cc. This falls under our thresholds tolerance of 1.0 cc.  Impression: The prostate seed implant appears to show adequate target coverage and appropriate rectal sparing.  Plan:  The patient will continue to follow with urology for ongoing PSA determinations. I would anticipate a high likelihood for local tumor control with minimal risk for rectal morbidity.  ________________________________  Sheral Apley Tammi Klippel, M.D.

## 2020-06-10 ENCOUNTER — Ambulatory Visit: Payer: Medicare Other

## 2020-06-11 ENCOUNTER — Telehealth: Payer: Self-pay

## 2020-06-11 DIAGNOSIS — F4322 Adjustment disorder with anxiety: Secondary | ICD-10-CM | POA: Diagnosis not present

## 2020-06-11 NOTE — Telephone Encounter (Signed)
Patient called in to let us know that he is getting a new monitor in the mail since his no longer works. Patient states it will take up to two weeks and they are going to try and expedite it. This is why we have not received any transmissions from monitor  Patient will call soon as his new one arrives

## 2020-06-18 DIAGNOSIS — F4322 Adjustment disorder with anxiety: Secondary | ICD-10-CM | POA: Diagnosis not present

## 2020-06-19 ENCOUNTER — Telehealth: Payer: Self-pay

## 2020-06-19 ENCOUNTER — Ambulatory Visit (INDEPENDENT_AMBULATORY_CARE_PROVIDER_SITE_OTHER): Payer: Medicare Other

## 2020-06-19 DIAGNOSIS — I472 Ventricular tachycardia: Secondary | ICD-10-CM | POA: Diagnosis not present

## 2020-06-19 DIAGNOSIS — I4729 Other ventricular tachycardia: Secondary | ICD-10-CM

## 2020-06-19 LAB — CUP PACEART REMOTE DEVICE CHECK
Battery Remaining Longevity: 10 mo
Battery Remaining Percentage: 11 %
Battery Voltage: 2.69 V
Brady Statistic AP VP Percent: 14 %
Brady Statistic AP VS Percent: 80 %
Brady Statistic AS VP Percent: 1 %
Brady Statistic AS VS Percent: 4.8 %
Brady Statistic RA Percent Paced: 92 %
Brady Statistic RV Percent Paced: 14 %
Date Time Interrogation Session: 20220314030017
HighPow Impedance: 54 Ohm
HighPow Impedance: 54 Ohm
Implantable Lead Implant Date: 20101123
Implantable Lead Implant Date: 20101123
Implantable Lead Location: 753859
Implantable Lead Location: 753860
Implantable Lead Model: 7121
Implantable Pulse Generator Implant Date: 20141203
Lead Channel Impedance Value: 430 Ohm
Lead Channel Impedance Value: 430 Ohm
Lead Channel Pacing Threshold Amplitude: 0.75 V
Lead Channel Pacing Threshold Amplitude: 1 V
Lead Channel Pacing Threshold Pulse Width: 0.5 ms
Lead Channel Pacing Threshold Pulse Width: 0.5 ms
Lead Channel Sensing Intrinsic Amplitude: 2 mV
Lead Channel Sensing Intrinsic Amplitude: 8.5 mV
Lead Channel Setting Pacing Amplitude: 2.5 V
Lead Channel Setting Pacing Amplitude: 2.5 V
Lead Channel Setting Pacing Pulse Width: 0.5 ms
Lead Channel Setting Sensing Sensitivity: 0.5 mV
Pulse Gen Serial Number: 7136426

## 2020-06-19 NOTE — Telephone Encounter (Signed)
Carelink alert received for Est Time to ERI 9.8 months. Remote transmissions increased to monthly and scheduled in [employer] and [employer]. Attempted to notify patient. VM message left requesting call back to [phone]. Will continue to follow.

## 2020-06-25 DIAGNOSIS — F4322 Adjustment disorder with anxiety: Secondary | ICD-10-CM | POA: Diagnosis not present

## 2020-06-26 ENCOUNTER — Telehealth: Payer: Self-pay

## 2020-06-26 NOTE — Telephone Encounter (Signed)
Called patient to advise of increase of monthly battery checks. Advised to call with further questions or concerns.

## 2020-06-28 NOTE — Progress Notes (Signed)
Remote ICD transmission.   

## 2020-07-02 DIAGNOSIS — F4322 Adjustment disorder with anxiety: Secondary | ICD-10-CM | POA: Diagnosis not present

## 2020-07-09 DIAGNOSIS — F4322 Adjustment disorder with anxiety: Secondary | ICD-10-CM | POA: Diagnosis not present

## 2020-07-10 DIAGNOSIS — Z23 Encounter for immunization: Secondary | ICD-10-CM | POA: Diagnosis not present

## 2020-07-13 ENCOUNTER — Other Ambulatory Visit: Payer: Self-pay | Admitting: Internal Medicine

## 2020-07-15 NOTE — Telephone Encounter (Signed)
Eliquis 5mg  refill request received. Patient is 75 years old, weight-95kg, Crea-1.00 on 04/26/2020, Diagnosis-Afib, and last seen by Dr. Rayann Heman on 05/17/20. Dose is appropriate based on dosing criteria. Will send in refill to requested pharmacy.

## 2020-07-16 ENCOUNTER — Other Ambulatory Visit: Payer: Self-pay

## 2020-07-16 ENCOUNTER — Ambulatory Visit (INDEPENDENT_AMBULATORY_CARE_PROVIDER_SITE_OTHER): Payer: Medicare Other | Admitting: Podiatry

## 2020-07-16 ENCOUNTER — Encounter: Payer: Self-pay | Admitting: Podiatry

## 2020-07-16 DIAGNOSIS — M7751 Other enthesopathy of right foot: Secondary | ICD-10-CM

## 2020-07-16 DIAGNOSIS — H9190 Unspecified hearing loss, unspecified ear: Secondary | ICD-10-CM | POA: Insufficient documentation

## 2020-07-16 DIAGNOSIS — D2371 Other benign neoplasm of skin of right lower limb, including hip: Secondary | ICD-10-CM

## 2020-07-16 DIAGNOSIS — D099 Carcinoma in situ, unspecified: Secondary | ICD-10-CM | POA: Insufficient documentation

## 2020-07-16 DIAGNOSIS — E785 Hyperlipidemia, unspecified: Secondary | ICD-10-CM | POA: Insufficient documentation

## 2020-07-16 DIAGNOSIS — F4322 Adjustment disorder with anxiety: Secondary | ICD-10-CM | POA: Diagnosis not present

## 2020-07-16 DIAGNOSIS — I420 Dilated cardiomyopathy: Secondary | ICD-10-CM | POA: Insufficient documentation

## 2020-07-16 DIAGNOSIS — Z7901 Long term (current) use of anticoagulants: Secondary | ICD-10-CM | POA: Insufficient documentation

## 2020-07-16 DIAGNOSIS — F324 Major depressive disorder, single episode, in partial remission: Secondary | ICD-10-CM | POA: Insufficient documentation

## 2020-07-16 DIAGNOSIS — I5020 Unspecified systolic (congestive) heart failure: Secondary | ICD-10-CM | POA: Insufficient documentation

## 2020-07-16 DIAGNOSIS — I272 Pulmonary hypertension, unspecified: Secondary | ICD-10-CM | POA: Insufficient documentation

## 2020-07-16 DIAGNOSIS — K5792 Diverticulitis of intestine, part unspecified, without perforation or abscess without bleeding: Secondary | ICD-10-CM | POA: Insufficient documentation

## 2020-07-16 MED ORDER — DEXAMETHASONE SODIUM PHOSPHATE 120 MG/30ML IJ SOLN
2.0000 mg | Freq: Once | INTRAMUSCULAR | Status: AC
Start: 2020-07-16 — End: 2020-07-16
  Administered 2020-07-16: 2 mg via INTRA_ARTICULAR

## 2020-07-16 NOTE — Progress Notes (Signed)
Subjective:  Patient ID: Robert Ochoa, male    DOB: 1945-07-19,  MRN: [id number] HPI Chief Complaint  Patient presents with  . Callouses    I have a spot on the ball of the right foot near the 5th toe     75 y.o. male presents with the above complaint.   ROS: Denies fever chills nausea vomiting muscle aches pains calf pain back pain chest pain shortness of breath.  Past Medical History:  Diagnosis Date  . Arrhythmogenic right ventricular dysplas ([place])   . Arrhythmogenic right ventricular dysplasia (HCC)    Hx of VT s/p dual-chamber St. Jude ICD. Cardiac MRI 2010 showed moderately dilated RV with hypokinesis, LV EF 47%. EF 50-55% 2010 but down to 45% 05/2011. Cath 01/2009 without significant CAD.  [name] Atrial fibrillation or flutter    Paroxysmal. Started on Pradaxa 05/2011.  . Cardiac defibrillator in place   . LV dysfunction    per echo 10/2011; EF 35%  // Echo 2/18: EF 40-45, diff HK, Gr 1 DD, mild dilated aortic root, mod LAE, mod RVE, mild reduced RVSF, mod RAE  . Prostate cancer ([place])   . Septic shock(785.52) 10-27-11 cause unknown   with associated respiratory/renal failure/PAF/bradycardia  . Ventricular tachycardia ([place])    ICD discharges 05/2011 for this & AF-RVR - started on amiodarone then.  . Wears glasses    Past Surgical History:  Procedure Laterality Date  . APPENDECTOMY  2002  . CARDIAC DEFIBRILLATOR PLACEMENT  02/19/09    St,. Jude   . IMPLANTABLE CARDIOVERTER DEFIBRILLATOR (ICD) GENERATOR CHANGE N/A 03/01/2013   Procedure: ICD GENERATOR CHANGE;  Surgeon: [name], MD;  Location: [facility] CATH LAB;  Service: Cardiovascular;  Laterality: N/A;  . IMPLANTABLE CARDIOVERTER DEFIBRILLATOR GENERATOR CHANGE  03/01/13   for premature battery depletetion.  Now has a SJM Ellipse DR device placed by Dr Caryl Comes  . PROSTATE BIOPSY  11/2019  . RADIOACTIVE SEED IMPLANT N/A 04/30/2020   Procedure: RADIOACTIVE SEED IMPLANT/BRACHYTHERAPY IMPLANT;  Surgeon: Ceasar Mons, MD;   Location: [facility];  Service: Urology;  Laterality: N/A;  . SPACE OAR INSTILLATION N/A 04/30/2020   Procedure: SPACE OAR INSTILLATION;  Surgeon: Ceasar Mons, MD;  Location: [facility];  Service: Urology;  Laterality: N/A;    Current Outpatient Medications:  .  amiodarone (PACERONE) 200 MG tablet, TAKE 1/2 TABLET(100 MG) BY MOUTH DAILY, Disp: 45 tablet, Rfl: 2 .  Calcium-Magnesium-Zinc (CAL-MAG-ZINC PO), Take 2 tablets by mouth daily., Disp: , Rfl:  .  ELIQUIS 5 MG TABS tablet, TAKE 1 TABLET BY MOUTH TWICE DAILY, Disp: 60 tablet, Rfl: 10 .  ibuprofen (ADVIL) 200 MG tablet, Take 200 mg by mouth every 6 (six) hours as needed for mild pain. (Patient not taking: Reported on 05/23/2020), Disp: , Rfl:  .  lisinopril (ZESTRIL) 10 MG tablet, TAKE 2 TABLETS BY MOUTH EVERY DAY, Disp: 180 tablet, Rfl: 3 .  metoprolol tartrate (LOPRESSOR) 25 MG tablet, TAKE 3 TABLETS BY MOUTH TWICE DAILY, Disp: 540 tablet, Rfl: 3 .  Omega-3 Fatty Acids (FISH OIL) 1000 MG CAPS, Take 3 capsules by mouth daily., Disp: , Rfl:  .  oxybutynin (DITROPAN-XL) 5 MG 24 hr tablet, Take 5 mg by mouth daily., Disp: , Rfl:  .  sildenafil (VIAGRA) 100 MG tablet, Take 1 tablet (100 mg total) by mouth daily as needed for erectile dysfunction., Disp: 10 tablet, Rfl: 0 .  tamsulosin (FLOMAX) 0.4 MG CAPS capsule, Take 0.4 mg by mouth daily.,  Disp: , Rfl:  .  vitamin C (ASCORBIC ACID) 500 MG tablet, Take 1,000 mg by mouth daily., Disp: , Rfl:   No Known Allergies Review of Systems Objective:  There were no vitals filed for this visit.  General: Well developed, nourished, in no acute distress, alert and oriented x3   Dermatological: Skin is warm, dry and supple bilateral. Nails x 10 are well maintained; remaining integument appears unremarkable at this time. There are no open sores, no preulcerative lesions, no rash or signs of infection present.  Vascular: Dorsalis Pedis artery and Posterior  Tibial artery pedal pulses are 2/4 bilateral with immedate capillary fill time. Pedal hair growth present. No varicosities and no lower extremity edema present bilateral.   Neruologic: Grossly intact via light touch bilateral. Vibratory intact via tuning fork bilateral. Protective threshold with Semmes Wienstein monofilament intact to all pedal sites bilateral. Patellar and Achilles deep tendon reflexes 2+ bilateral. No Babinski or clonus noted bilateral.   Musculoskeletal: No gross boney pedal deformities bilateral. No pain, crepitus, or limitation noted with foot and ankle range of motion bilateral. Muscular strength 5/5 in all groups tested bilateral.  Mild tailor's bunion deformity with cavus foot deformity and hammertoe deformities of the bilateral foot.  Prominent fifth metatarsal with porokeratotic lesion and fluctuance beneath the lesion consistent with bursitis.  Gait: Unassisted, Nonantalgic.    Radiographs:  Radiographs demonstrate cavus deformity plantarflexed fifth metatarsal no acute findings  Assessment & Plan:   Assessment: Cavus foot deformity with sub fifth metatarsal head capsulitis and poor keratoma.  Plan: I injected the area today with dexamethasone and local anesthetic.  Provided him with information about cutting a small area out in an insole to help offload the fifth met.  I also debrided the porokeratotic lesion.  I will follow-up with him in about 6 weeks for reevaluation and retreatment if necessary.     Marcellene Shivley T. [place], [place]

## 2020-07-22 ENCOUNTER — Ambulatory Visit (INDEPENDENT_AMBULATORY_CARE_PROVIDER_SITE_OTHER): Payer: Medicare Other

## 2020-07-22 ENCOUNTER — Telehealth: Payer: Self-pay

## 2020-07-22 DIAGNOSIS — I420 Dilated cardiomyopathy: Secondary | ICD-10-CM

## 2020-07-22 LAB — CUP PACEART REMOTE DEVICE CHECK
Battery Remaining Longevity: 9 mo
Battery Remaining Percentage: 10 %
Battery Voltage: 2.68 V
Brady Statistic AP VP Percent: 17 %
Brady Statistic AP VS Percent: 78 %
Brady Statistic AS VP Percent: 1 %
Brady Statistic AS VS Percent: 4.6 %
Brady Statistic RA Percent Paced: 88 %
Brady Statistic RV Percent Paced: 21 %
Date Time Interrogation Session: 20220423040017
HighPow Impedance: 52 Ohm
HighPow Impedance: 53 Ohm
Implantable Lead Implant Date: 20101123
Implantable Lead Implant Date: 20101123
Implantable Lead Location: 753859
Implantable Lead Location: 753860
Implantable Lead Model: 7121
Implantable Pulse Generator Implant Date: 20141203
Lead Channel Impedance Value: 430 Ohm
Lead Channel Impedance Value: 430 Ohm
Lead Channel Pacing Threshold Amplitude: 0.75 V
Lead Channel Pacing Threshold Amplitude: 1 V
Lead Channel Pacing Threshold Pulse Width: 0.5 ms
Lead Channel Pacing Threshold Pulse Width: 0.5 ms
Lead Channel Sensing Intrinsic Amplitude: 10.1 mV
Lead Channel Sensing Intrinsic Amplitude: 2.4 mV
Lead Channel Setting Pacing Amplitude: 2.5 V
Lead Channel Setting Pacing Amplitude: 2.5 V
Lead Channel Setting Pacing Pulse Width: 0.5 ms
Lead Channel Setting Sensing Sensitivity: 0.5 mV
Pulse Gen Serial Number: 7136426

## 2020-07-22 NOTE — Telephone Encounter (Signed)
Manual transmission received, pt no longer in AF at this time, it appears episode ended around 9am this morning.  Thus lasting about 30 hours.    Advised pt of Dr. Jackalyn Lombard recommendation for AF clinic follow-up given the persistent nature of his AF.    Pt agreeable.

## 2020-07-22 NOTE — Telephone Encounter (Signed)
The patient called to get help sending a transmission. Transmission received. I told him I will let the nurse know.

## 2020-07-22 NOTE — Telephone Encounter (Addendum)
[facility] alert-   Alert remote transmission for long AT/AF event.  Ongoing AF event, v rates controlled.  Known PAF.  Meds: Eliquis, Lopressor.    Current episode appears to have started early AM 4/24.  Overall V rates controlled.   Spoke with pt.  Initially he reported he feels good and denies any symptoms.  Upon further discussion he stated that he did feel overly tired yesterday but had attributed that to increased exercise on previous day.  Pt not at home but will send a manual transmission this evening to determine if rhythm converted.    Pt confirms compliance with meds as ordered including Amiodarone 100mg  daily, Metoprolol 75mg  BID and Eliquis for [place].    Advised pt I would forward to MD, we can add follow-up transmission when he sends it.

## 2020-07-22 NOTE — Telephone Encounter (Signed)
Persistently in afib. Lets have him follow-up at the AF clinic.

## 2020-07-23 DIAGNOSIS — F4322 Adjustment disorder with anxiety: Secondary | ICD-10-CM | POA: Diagnosis not present

## 2020-07-23 NOTE — Telephone Encounter (Signed)
Patient returned my message and is aware of appt 07/31/20 at 10:30 am with AFib Clinic.

## 2020-07-23 NOTE — Telephone Encounter (Signed)
Called and left message for patient to call back to schedule appt. 

## 2020-07-30 DIAGNOSIS — F4322 Adjustment disorder with anxiety: Secondary | ICD-10-CM | POA: Diagnosis not present

## 2020-07-31 ENCOUNTER — Ambulatory Visit (HOSPITAL_COMMUNITY)
Admission: RE | Admit: 2020-07-31 | Discharge: 2020-07-31 | Disposition: A | Discharge status: Home / Self Care | Payer: Medicare Other | Source: Ambulatory Visit | Attending: Physician Assistant | Admitting: Physician Assistant

## 2020-07-31 ENCOUNTER — Encounter (HOSPITAL_COMMUNITY): Payer: Self-pay | Admitting: Physician Assistant

## 2020-07-31 ENCOUNTER — Other Ambulatory Visit: Payer: Self-pay

## 2020-07-31 VITALS — BP 112/74 | HR 60 | Ht 74.0 in | Wt 213.2 lb

## 2020-07-31 DIAGNOSIS — Z7901 Long term (current) use of anticoagulants: Secondary | ICD-10-CM | POA: Insufficient documentation

## 2020-07-31 DIAGNOSIS — I472 Ventricular tachycardia: Secondary | ICD-10-CM | POA: Diagnosis not present

## 2020-07-31 DIAGNOSIS — Z79899 Other long term (current) drug therapy: Secondary | ICD-10-CM | POA: Diagnosis not present

## 2020-07-31 DIAGNOSIS — I480 Paroxysmal atrial fibrillation: Secondary | ICD-10-CM | POA: Diagnosis not present

## 2020-07-31 DIAGNOSIS — D6869 Other thrombophilia: Secondary | ICD-10-CM | POA: Diagnosis not present

## 2020-07-31 DIAGNOSIS — Z8249 Family history of ischemic heart disease and other diseases of the circulatory system: Secondary | ICD-10-CM | POA: Diagnosis not present

## 2020-07-31 NOTE — Progress Notes (Signed)
Primary Care Physician: London Pepper, MD Primary Electrophysiologist: Dr Rayann Heman Referring Physician: Dr Rayann Heman   Robert Ochoa is a 75 y.o. male with a history of ARVC, VT, ILD, HLD, LV dysfunction, and atrial fibrillation who presents for follow up in the [facility]. Patient has been maintained on amiodarone. Patient is on Eliquis for a CHADS2VASC score of 3. The device clinic received an alert for an ongoing afib episode starting 07/21/20. Repeat device interrogation showed he was back in SR on 4/25. He was unaware of his arrhythmia, possibly had some mild fatigue but couldn't be sure. He denies any bleeding issues on anticoagulation. There were no triggers for his afib that he could identify.   Today, he denies symptoms of palpitations, chest pain, shortness of breath, orthopnea, PND, lower extremity edema, dizziness, presyncope, syncope, snoring, daytime somnolence, bleeding, or neurologic sequela. The patient is tolerating medications without difficulties and is otherwise without complaint today.    Atrial Fibrillation Risk Factors:  he does not have symptoms or diagnosis of sleep apnea. he does not have a history of rheumatic fever.   he has a BMI of Body mass index is 27.37 kg/m.[name] Filed Weights   07/31/20 1031  Weight: 96.7 kg    Family History  Problem Relation Age of Onset  . Heart attack Father   . Allergies Mother   . Cancer Paternal Aunt        type unknown  . Prostate cancer Cousin   . Colon cancer Neg Hx   . Pancreatic cancer Neg Hx   . Breast cancer Neg Hx      Atrial Fibrillation Management history:  Previous antiarrhythmic drugs: amiodarone (patient wishes to continue despite ILD) Previous cardioversions: none Previous ablations: none CHADS2VASC score: 3 Anticoagulation history: Eliquis   Past Medical History:  Diagnosis Date  . Arrhythmogenic right ventricular dysplas ([place])   . Arrhythmogenic right ventricular  dysplasia ([facility])    Hx of VT s/p dual-chamber St. Jude ICD. Cardiac MRI 2010 showed moderately dilated RV with hypokinesis, LV EF 47%. EF 50-55% 2010 but down to 45% 05/2011. Cath 01/2009 without significant CAD.  [name] Atrial fibrillation or flutter    Paroxysmal. Started on Pradaxa 05/2011.  . Cardiac defibrillator in place   . LV dysfunction    per echo 10/2011; EF 35%  // Echo 2/18: EF 40-45, diff HK, Gr 1 DD, mild dilated aortic root, mod LAE, mod RVE, mild reduced RVSF, mod RAE  . Prostate cancer ([place])   . Septic shock(785.52) 10-27-11 cause unknown   with associated respiratory/renal failure/PAF/bradycardia  . Ventricular tachycardia ([place])    ICD discharges 05/2011 for this & AF-RVR - started on amiodarone then.  . Wears glasses    Past Surgical History:  Procedure Laterality Date  . APPENDECTOMY  2002  . CARDIAC DEFIBRILLATOR PLACEMENT  02/19/09    St,. Jude   . IMPLANTABLE CARDIOVERTER DEFIBRILLATOR (ICD) GENERATOR CHANGE N/A 03/01/2013   Procedure: ICD GENERATOR CHANGE;  Surgeon: [name], MD;  Location: [facility] CATH LAB;  Service: Cardiovascular;  Laterality: N/A;  . IMPLANTABLE CARDIOVERTER DEFIBRILLATOR GENERATOR CHANGE  03/01/13   for premature battery depletetion.  Now has a SJM Ellipse DR device placed by Dr Caryl Comes  . PROSTATE BIOPSY  11/2019  . RADIOACTIVE SEED IMPLANT N/A 04/30/2020   Procedure: RADIOACTIVE SEED IMPLANT/BRACHYTHERAPY IMPLANT;  Surgeon: Ceasar Mons, MD;  Location: [facility];  Service: Urology;  Laterality: N/A;  . SPACE OAR INSTILLATION N/A  04/30/2020   Procedure: SPACE OAR INSTILLATION;  Surgeon: Ceasar Mons, MD;  Location: [facility];  Service: Urology;  Laterality: N/A;    Current Outpatient Medications  Medication Sig Dispense Refill  . amiodarone (PACERONE) 200 MG tablet TAKE 1/2 TABLET(100 MG) BY MOUTH DAILY 45 tablet 2  . Calcium-Magnesium-Zinc (CAL-MAG-ZINC PO) Take 2 tablets by mouth daily.    [name]  ELIQUIS 5 MG TABS tablet TAKE 1 TABLET BY MOUTH TWICE DAILY 60 tablet 10  . lisinopril (ZESTRIL) 10 MG tablet TAKE 2 TABLETS BY MOUTH EVERY DAY 180 tablet 3  . metoprolol tartrate (LOPRESSOR) 25 MG tablet TAKE 3 TABLETS BY MOUTH TWICE DAILY 540 tablet 3  . Omega-3 Fatty Acids (FISH OIL) 1000 MG CAPS Take 3 capsules by mouth daily.    [name] oxybutynin (DITROPAN-XL) 5 MG 24 hr tablet Take 5 mg by mouth daily.    . sildenafil (VIAGRA) 100 MG tablet Take 1 tablet (100 mg total) by mouth daily as needed for erectile dysfunction. 10 tablet 0  . tamsulosin (FLOMAX) 0.4 MG CAPS capsule Take 0.4 mg by mouth daily.    . vitamin C (ASCORBIC ACID) 500 MG tablet Take 1,000 mg by mouth daily.     No current facility-administered medications for this encounter.    No Known Allergies  Social History   Socioeconomic History  . Marital status: Single    Spouse name: Not on file  . Number of children: 1  . Years of education: Not on file  . Highest education level: Not on file  Occupational History    Employer: [employer]    Comment: Psychologist, sport and exercise  Tobacco Use  . Smoking status: Never Smoker  . Smokeless tobacco: Never Used  Vaping Use  . Vaping Use: Never used  Substance and Sexual Activity  . Alcohol use: Yes    Alcohol/week: 1.0 standard drink    Types: 1 Cans of beer per week    Comment: monthly  . Drug use: No  . Sexual activity: Yes    Birth control/protection: Coitus interruptus  Other Topics Concern  . Not on file  Social History Narrative   Divorced w 1 child. Self employed.    Social Determinants of Health   Financial Resource Strain: Not on file  Food Insecurity: Not on file  Transportation Needs: Not on file  Physical Activity: Not on file  Stress: Not on file  Social Connections: Not on file  Intimate Partner Violence: Not on file     ROS- All systems are reviewed and negative except as per the HPI above.  Physical Exam: Vitals:   07/31/20 1031  BP:  112/74  Pulse: 60  Weight: 96.7 kg  Height: 6\' 2"  (1.88 m)    GEN- The patient is a well appearing elderly male, alert and oriented x 3 today.   Head- normocephalic, atraumatic Eyes-  Sclera clear, conjunctiva pink Ears- hearing intact Oropharynx- clear Neck- supple  Lungs- Clear to ausculation bilaterally, normal work of breathing Heart- Regular rate and rhythm, no murmurs, rubs or gallops  GI- soft, NT, ND, + BS Extremities- no clubbing, cyanosis, or edema MS- no significant deformity or atrophy Skin- no rash or lesion Psych- euthymic mood, full affect Neuro- strength and sensation are intact  Wt Readings from Last 3 Encounters:  07/31/20 96.7 kg  05/23/20 95 kg  05/17/20 93.5 kg    EKG today demonstrates  A paced rhythm  Vent. rate 60 BPM PR interval 282 ms  QRS duration 110 ms QT/QTcB 398/398 ms  Echo 06/07/19 demonstrated  1. Left ventricular ejection fraction, by estimation, is 55 to 60%. The  left ventricle has normal function. The left ventricle has no regional  wall motion abnormalities. Left ventricular diastolic parameters are  consistent with Grade I diastolic  dysfunction (impaired relaxation).  2. Right ventricular systolic function is normal. The right ventricular  size is normal. There is normal pulmonary artery systolic pressure.  3. Right atrial size was mildly dilated.  4. The mitral valve is normal in structure. No evidence of mitral valve  regurgitation. No evidence of mitral stenosis.  5. The aortic valve is normal in structure. Aortic valve regurgitation is  not visualized. No aortic stenosis is present.  6. Aortic dilatation noted. There is mild dilatation of the ascending  aorta measuring 38 mm.   Epic records are reviewed at length today  CHA2DS2-VASc Score = 3  The patient's score is based upon: CHF History: Yes HTN History: No Diabetes History: No Stroke History: No Vascular Disease History: No Age Score: 2 Gender Score: 0       ASSESSMENT AND PLAN: 1. Paroxysmal Atrial Fibrillation  The patient's CHA2DS2-VASc score is 3, indicating a 3.2% annual risk of stroke.   Patient converted back to SR ~ 30 hours. He was rate controlled and asymptomatic. No changes for now.  Continue amiodarone 100 mg daily Continue Eliquis 5 mg BID Continue Lopressor 75 mg BID  2. Secondary Hypercoagulable State (ICD10:  D68.69) The patient is at significant risk for stroke/thromboembolism based upon his CHA2DS2-VASc Score of 3.  Continue Apixaban (Eliquis).   3. ARVC EF preserved  S/p ICD, followed by Dr Rayann Heman and the device clinic.  4. VT Controlled on amiodarone.    Follow up with Oda Kilts and Dr Rayann Heman per recall.    [facility] [address] [place], [place] [address] [phone] 07/31/2020 10:36 AM

## 2020-08-06 ENCOUNTER — Ambulatory Visit (INDEPENDENT_AMBULATORY_CARE_PROVIDER_SITE_OTHER): Payer: Medicare Other | Admitting: Podiatry

## 2020-08-06 ENCOUNTER — Encounter: Payer: Self-pay | Admitting: Podiatry

## 2020-08-06 ENCOUNTER — Other Ambulatory Visit: Payer: Self-pay

## 2020-08-06 DIAGNOSIS — M7751 Other enthesopathy of right foot: Secondary | ICD-10-CM

## 2020-08-06 DIAGNOSIS — D2371 Other benign neoplasm of skin of right lower limb, including hip: Secondary | ICD-10-CM | POA: Diagnosis not present

## 2020-08-06 DIAGNOSIS — F4322 Adjustment disorder with anxiety: Secondary | ICD-10-CM | POA: Diagnosis not present

## 2020-08-06 NOTE — Progress Notes (Signed)
He presents today for follow-up of his bursitis of his right foot states that is doing better but he still feels like I am walking on something hard.  Objective: Vital signs are stable alert oriented x3 cavus foot deformity with hammertoe deformities resulting in loss of fat pad and fifth metatarsalgia distally.  No open lesions or wounds minimal thickening of the reactive hyperkeratotic lesion.  Assessment: Cavus foot deformity.  Plan: He will follow-up with me in about 6 months for another set of injections if necessary as well as debridement.  He will notify me sooner if needed.

## 2020-08-09 ENCOUNTER — Other Ambulatory Visit: Payer: Self-pay | Admitting: Internal Medicine

## 2020-08-09 NOTE — Progress Notes (Signed)
Remote ICD transmission.   

## 2020-08-12 DIAGNOSIS — F4322 Adjustment disorder with anxiety: Secondary | ICD-10-CM | POA: Diagnosis not present

## 2020-08-13 DIAGNOSIS — F4322 Adjustment disorder with anxiety: Secondary | ICD-10-CM | POA: Diagnosis not present

## 2020-08-20 DIAGNOSIS — F4322 Adjustment disorder with anxiety: Secondary | ICD-10-CM | POA: Diagnosis not present

## 2020-08-22 ENCOUNTER — Ambulatory Visit (INDEPENDENT_AMBULATORY_CARE_PROVIDER_SITE_OTHER): Payer: Medicare Other

## 2020-08-22 DIAGNOSIS — I420 Dilated cardiomyopathy: Secondary | ICD-10-CM

## 2020-08-22 LAB — CUP PACEART REMOTE DEVICE CHECK
Battery Remaining Longevity: 8 mo
Battery Remaining Percentage: 9 %
Battery Voltage: 2.66 V
Brady Statistic AP VP Percent: 19 %
Brady Statistic AP VS Percent: 75 %
Brady Statistic AS VP Percent: 1 %
Brady Statistic AS VS Percent: 5 %
Brady Statistic RA Percent Paced: 87 %
Brady Statistic RV Percent Paced: 23 %
Date Time Interrogation Session: 20220526052657
HighPow Impedance: 55 Ohm
HighPow Impedance: 55 Ohm
Implantable Lead Implant Date: 20101123
Implantable Lead Implant Date: 20101123
Implantable Lead Location: 753859
Implantable Lead Location: 753860
Implantable Lead Model: 7121
Implantable Pulse Generator Implant Date: 20141203
Lead Channel Impedance Value: 360 Ohm
Lead Channel Impedance Value: 430 Ohm
Lead Channel Pacing Threshold Amplitude: 0.75 V
Lead Channel Pacing Threshold Amplitude: 1 V
Lead Channel Pacing Threshold Pulse Width: 0.5 ms
Lead Channel Pacing Threshold Pulse Width: 0.5 ms
Lead Channel Sensing Intrinsic Amplitude: 2.1 mV
Lead Channel Sensing Intrinsic Amplitude: 8.5 mV
Lead Channel Setting Pacing Amplitude: 2.5 V
Lead Channel Setting Pacing Amplitude: 2.5 V
Lead Channel Setting Pacing Pulse Width: 0.5 ms
Lead Channel Setting Sensing Sensitivity: 0.5 mV
Pulse Gen Serial Number: 7136426

## 2020-08-27 ENCOUNTER — Ambulatory Visit: Payer: Medicare Other | Admitting: Podiatry

## 2020-08-27 DIAGNOSIS — F4322 Adjustment disorder with anxiety: Secondary | ICD-10-CM | POA: Diagnosis not present

## 2020-09-03 DIAGNOSIS — F4322 Adjustment disorder with anxiety: Secondary | ICD-10-CM | POA: Diagnosis not present

## 2020-09-10 DIAGNOSIS — F4322 Adjustment disorder with anxiety: Secondary | ICD-10-CM | POA: Diagnosis not present

## 2020-09-16 DIAGNOSIS — C61 Malignant neoplasm of prostate: Secondary | ICD-10-CM | POA: Diagnosis not present

## 2020-09-17 DIAGNOSIS — F4322 Adjustment disorder with anxiety: Secondary | ICD-10-CM | POA: Diagnosis not present

## 2020-09-17 NOTE — Progress Notes (Signed)
Remote ICD transmission.  No

## 2020-09-23 ENCOUNTER — Ambulatory Visit (INDEPENDENT_AMBULATORY_CARE_PROVIDER_SITE_OTHER): Payer: Medicare Other

## 2020-09-23 DIAGNOSIS — I428 Other cardiomyopathies: Secondary | ICD-10-CM | POA: Diagnosis not present

## 2020-09-23 DIAGNOSIS — N401 Enlarged prostate with lower urinary tract symptoms: Secondary | ICD-10-CM | POA: Diagnosis not present

## 2020-09-23 DIAGNOSIS — C61 Malignant neoplasm of prostate: Secondary | ICD-10-CM | POA: Diagnosis not present

## 2020-09-23 DIAGNOSIS — N5201 Erectile dysfunction due to arterial insufficiency: Secondary | ICD-10-CM | POA: Diagnosis not present

## 2020-09-23 DIAGNOSIS — R3915 Urgency of urination: Secondary | ICD-10-CM | POA: Diagnosis not present

## 2020-09-24 DIAGNOSIS — F4322 Adjustment disorder with anxiety: Secondary | ICD-10-CM | POA: Diagnosis not present

## 2020-09-24 LAB — CUP PACEART REMOTE DEVICE CHECK
Battery Remaining Longevity: 7 mo
Battery Remaining Percentage: 8 %
Battery Voltage: 2.66 V
Brady Statistic AP VP Percent: 20 %
Brady Statistic AP VS Percent: 74 %
Brady Statistic AS VP Percent: 1 %
Brady Statistic AS VS Percent: 4.4 %
Brady Statistic RA Percent Paced: 89 %
Brady Statistic RV Percent Paced: 24 %
Date Time Interrogation Session: 20220626020017
HighPow Impedance: 51 Ohm
HighPow Impedance: 51 Ohm
Implantable Lead Implant Date: 20101123
Implantable Lead Implant Date: 20101123
Implantable Lead Location: 753859
Implantable Lead Location: 753860
Implantable Lead Model: 7121
Implantable Pulse Generator Implant Date: 20141203
Lead Channel Impedance Value: 360 Ohm
Lead Channel Impedance Value: 430 Ohm
Lead Channel Pacing Threshold Amplitude: 0.75 V
Lead Channel Pacing Threshold Amplitude: 1 V
Lead Channel Pacing Threshold Pulse Width: 0.5 ms
Lead Channel Pacing Threshold Pulse Width: 0.5 ms
Lead Channel Sensing Intrinsic Amplitude: 1.1 mV
Lead Channel Sensing Intrinsic Amplitude: 7.8 mV
Lead Channel Setting Pacing Amplitude: 2.5 V
Lead Channel Setting Pacing Amplitude: 2.5 V
Lead Channel Setting Pacing Pulse Width: 0.5 ms
Lead Channel Setting Sensing Sensitivity: 0.5 mV
Pulse Gen Serial Number: 7136426

## 2020-10-08 DIAGNOSIS — F4322 Adjustment disorder with anxiety: Secondary | ICD-10-CM | POA: Diagnosis not present

## 2020-10-09 DIAGNOSIS — L570 Actinic keratosis: Secondary | ICD-10-CM | POA: Diagnosis not present

## 2020-10-09 DIAGNOSIS — D692 Other nonthrombocytopenic purpura: Secondary | ICD-10-CM | POA: Diagnosis not present

## 2020-10-09 DIAGNOSIS — Z85828 Personal history of other malignant neoplasm of skin: Secondary | ICD-10-CM | POA: Diagnosis not present

## 2020-10-09 DIAGNOSIS — L821 Other seborrheic keratosis: Secondary | ICD-10-CM | POA: Diagnosis not present

## 2020-10-10 DIAGNOSIS — H26491 Other secondary cataract, right eye: Secondary | ICD-10-CM | POA: Diagnosis not present

## 2020-10-10 DIAGNOSIS — H04123 Dry eye syndrome of bilateral lacrimal glands: Secondary | ICD-10-CM | POA: Diagnosis not present

## 2020-10-10 DIAGNOSIS — H35362 Drusen (degenerative) of macula, left eye: Secondary | ICD-10-CM | POA: Diagnosis not present

## 2020-10-10 DIAGNOSIS — H524 Presbyopia: Secondary | ICD-10-CM | POA: Diagnosis not present

## 2020-10-11 NOTE — Progress Notes (Signed)
Remote ICD transmission.   

## 2020-10-15 DIAGNOSIS — F4322 Adjustment disorder with anxiety: Secondary | ICD-10-CM | POA: Diagnosis not present

## 2020-10-22 DIAGNOSIS — F4322 Adjustment disorder with anxiety: Secondary | ICD-10-CM | POA: Diagnosis not present

## 2020-10-24 ENCOUNTER — Ambulatory Visit (INDEPENDENT_AMBULATORY_CARE_PROVIDER_SITE_OTHER): Payer: Medicare Other

## 2020-10-24 DIAGNOSIS — I4729 Other ventricular tachycardia: Secondary | ICD-10-CM

## 2020-10-24 DIAGNOSIS — I472 Ventricular tachycardia: Secondary | ICD-10-CM

## 2020-10-25 LAB — CUP PACEART REMOTE DEVICE CHECK
Battery Remaining Longevity: 7 mo
Battery Remaining Percentage: 8 %
Battery Voltage: 2.65 V
Brady Statistic AP VP Percent: 22 %
Brady Statistic AP VS Percent: 73 %
Brady Statistic AS VP Percent: 1 %
Brady Statistic AS VS Percent: 3.8 %
Brady Statistic RA Percent Paced: 89 %
Brady Statistic RV Percent Paced: 25 %
Date Time Interrogation Session: 20220727034742
HighPow Impedance: 53 Ohm
HighPow Impedance: 53 Ohm
Implantable Lead Implant Date: 20101123
Implantable Lead Implant Date: 20101123
Implantable Lead Location: 753859
Implantable Lead Location: 753860
Implantable Lead Model: 7121
Implantable Pulse Generator Implant Date: 20141203
Lead Channel Impedance Value: 400 Ohm
Lead Channel Impedance Value: 440 Ohm
Lead Channel Pacing Threshold Amplitude: 0.75 V
Lead Channel Pacing Threshold Amplitude: 1 V
Lead Channel Pacing Threshold Pulse Width: 0.5 ms
Lead Channel Pacing Threshold Pulse Width: 0.5 ms
Lead Channel Sensing Intrinsic Amplitude: 2.9 mV
Lead Channel Sensing Intrinsic Amplitude: 8.5 mV
Lead Channel Setting Pacing Amplitude: 2.5 V
Lead Channel Setting Pacing Amplitude: 2.5 V
Lead Channel Setting Pacing Pulse Width: 0.5 ms
Lead Channel Setting Sensing Sensitivity: 0.5 mV
Pulse Gen Serial Number: 7136426

## 2020-10-29 DIAGNOSIS — F4322 Adjustment disorder with anxiety: Secondary | ICD-10-CM | POA: Diagnosis not present

## 2020-11-12 DIAGNOSIS — F4322 Adjustment disorder with anxiety: Secondary | ICD-10-CM | POA: Diagnosis not present

## 2020-11-16 DIAGNOSIS — U071 COVID-19: Secondary | ICD-10-CM | POA: Diagnosis not present

## 2020-11-19 ENCOUNTER — Telehealth: Payer: Self-pay | Admitting: Internal Medicine

## 2020-11-19 ENCOUNTER — Other Ambulatory Visit: Payer: Self-pay | Admitting: *Deleted

## 2020-11-19 MED ORDER — METOPROLOL TARTRATE 25 MG PO TABS: 75.0000 mg | ORAL_TABLET | Freq: Two times a day (BID) | ORAL | 1 refills | Status: DC

## 2020-11-19 NOTE — Telephone Encounter (Signed)
*  STAT* If patient is at the pharmacy, call can be transferred to refill team.   1. Which medications need to be refilled? (please list name of each medication and dose if known) Metoprolol  2. Which pharmacy/location (including street and city if local pharmacy) is medication to be sent to? [facility] [place], [place]  3. Do they need a 30 day or 90 day supply?  Follow Up:90 days and refills

## 2020-11-19 NOTE — Telephone Encounter (Signed)
Rx sent in as requested. Confirmation received.

## 2020-11-20 NOTE — Progress Notes (Signed)
Remote ICD transmission.   

## 2020-11-20 NOTE — Addendum Note (Signed)
Addended by: Douglass Rivers D on: 11/20/2020 01:16 PM   Modules accepted: Level of Service

## 2020-11-25 ENCOUNTER — Ambulatory Visit (INDEPENDENT_AMBULATORY_CARE_PROVIDER_SITE_OTHER): Payer: Medicare Other

## 2020-11-25 DIAGNOSIS — I428 Other cardiomyopathies: Secondary | ICD-10-CM

## 2020-11-25 LAB — CUP PACEART REMOTE DEVICE CHECK
Battery Remaining Longevity: 6 mo
Battery Remaining Percentage: 6 %
Battery Voltage: 2.63 V
Brady Statistic AP VP Percent: 22 %
Brady Statistic AP VS Percent: 73 %
Brady Statistic AS VP Percent: 1 %
Brady Statistic AS VS Percent: 3.7 %
Brady Statistic RA Percent Paced: 89 %
Brady Statistic RV Percent Paced: 25 %
Date Time Interrogation Session: 20220829020015
HighPow Impedance: 53 Ohm
HighPow Impedance: 53 Ohm
Implantable Lead Implant Date: 20101123
Implantable Lead Implant Date: 20101123
Implantable Lead Location: 753859
Implantable Lead Location: 753860
Implantable Lead Model: 7121
Implantable Pulse Generator Implant Date: 20141203
Lead Channel Impedance Value: 360 Ohm
Lead Channel Impedance Value: 430 Ohm
Lead Channel Pacing Threshold Amplitude: 0.75 V
Lead Channel Pacing Threshold Amplitude: 1 V
Lead Channel Pacing Threshold Pulse Width: 0.5 ms
Lead Channel Pacing Threshold Pulse Width: 0.5 ms
Lead Channel Sensing Intrinsic Amplitude: 1.8 mV
Lead Channel Sensing Intrinsic Amplitude: 8.1 mV
Lead Channel Setting Pacing Amplitude: 2.5 V
Lead Channel Setting Pacing Amplitude: 2.5 V
Lead Channel Setting Pacing Pulse Width: 0.5 ms
Lead Channel Setting Sensing Sensitivity: 0.5 mV
Pulse Gen Serial Number: 7136426

## 2020-11-26 DIAGNOSIS — F4322 Adjustment disorder with anxiety: Secondary | ICD-10-CM | POA: Diagnosis not present

## 2020-11-28 DIAGNOSIS — U071 COVID-19: Secondary | ICD-10-CM | POA: Diagnosis not present

## 2020-12-03 DIAGNOSIS — F4322 Adjustment disorder with anxiety: Secondary | ICD-10-CM | POA: Diagnosis not present

## 2020-12-03 DIAGNOSIS — K432 Incisional hernia without obstruction or gangrene: Secondary | ICD-10-CM | POA: Diagnosis not present

## 2020-12-06 ENCOUNTER — Other Ambulatory Visit: Payer: Self-pay | Admitting: Surgery

## 2020-12-06 DIAGNOSIS — K432 Incisional hernia without obstruction or gangrene: Secondary | ICD-10-CM

## 2020-12-06 NOTE — Progress Notes (Signed)
Remote ICD transmission.   

## 2020-12-10 DIAGNOSIS — F4322 Adjustment disorder with anxiety: Secondary | ICD-10-CM | POA: Diagnosis not present

## 2020-12-17 DIAGNOSIS — F4322 Adjustment disorder with anxiety: Secondary | ICD-10-CM | POA: Diagnosis not present

## 2020-12-17 DIAGNOSIS — C61 Malignant neoplasm of prostate: Secondary | ICD-10-CM | POA: Diagnosis not present

## 2020-12-24 DIAGNOSIS — F4322 Adjustment disorder with anxiety: Secondary | ICD-10-CM | POA: Diagnosis not present

## 2020-12-25 ENCOUNTER — Other Ambulatory Visit: Payer: Self-pay

## 2020-12-25 ENCOUNTER — Ambulatory Visit
Admission: RE | Admit: 2020-12-25 | Discharge: 2020-12-25 | Disposition: A | Discharge status: Home / Self Care | Payer: Medicare Other | Source: Ambulatory Visit | Attending: Surgery | Admitting: Surgery

## 2020-12-25 DIAGNOSIS — K449 Diaphragmatic hernia without obstruction or gangrene: Secondary | ICD-10-CM | POA: Diagnosis not present

## 2020-12-25 DIAGNOSIS — I700 Atherosclerosis of aorta: Secondary | ICD-10-CM | POA: Diagnosis not present

## 2020-12-25 DIAGNOSIS — K432 Incisional hernia without obstruction or gangrene: Secondary | ICD-10-CM

## 2020-12-25 DIAGNOSIS — K5730 Diverticulosis of large intestine without perforation or abscess without bleeding: Secondary | ICD-10-CM | POA: Diagnosis not present

## 2020-12-25 MED ORDER — IOPAMIDOL (ISOVUE-300) INJECTION 61%
100.0000 mL | Freq: Once | INTRAVENOUS | Status: AC | PRN
  Administered 2020-12-25: 100 mL via INTRAVENOUS

## 2020-12-26 ENCOUNTER — Encounter: Payer: Self-pay | Admitting: Surgery

## 2020-12-26 ENCOUNTER — Ambulatory Visit (INDEPENDENT_AMBULATORY_CARE_PROVIDER_SITE_OTHER): Payer: Medicare Other

## 2020-12-26 DIAGNOSIS — I428 Other cardiomyopathies: Secondary | ICD-10-CM

## 2020-12-26 LAB — CUP PACEART REMOTE DEVICE CHECK
Battery Remaining Longevity: 4 mo
Battery Remaining Percentage: 5 %
Battery Voltage: 2.62 V
Brady Statistic AP VP Percent: 22 %
Brady Statistic AP VS Percent: 73 %
Brady Statistic AS VP Percent: 1 %
Brady Statistic AS VS Percent: 3.5 %
Brady Statistic RA Percent Paced: 90 %
Brady Statistic RV Percent Paced: 24 %
Date Time Interrogation Session: 20220929020015
HighPow Impedance: 56 Ohm
HighPow Impedance: 56 Ohm
Implantable Lead Implant Date: 20101123
Implantable Lead Implant Date: 20101123
Implantable Lead Location: 753859
Implantable Lead Location: 753860
Implantable Lead Model: 7121
Implantable Pulse Generator Implant Date: 20141203
Lead Channel Impedance Value: 360 Ohm
Lead Channel Impedance Value: 440 Ohm
Lead Channel Pacing Threshold Amplitude: 0.75 V
Lead Channel Pacing Threshold Amplitude: 1 V
Lead Channel Pacing Threshold Pulse Width: 0.5 ms
Lead Channel Pacing Threshold Pulse Width: 0.5 ms
Lead Channel Sensing Intrinsic Amplitude: 2.2 mV
Lead Channel Sensing Intrinsic Amplitude: 8.1 mV
Lead Channel Setting Pacing Amplitude: 2.5 V
Lead Channel Setting Pacing Amplitude: 2.5 V
Lead Channel Setting Pacing Pulse Width: 0.5 ms
Lead Channel Setting Sensing Sensitivity: 0.5 mV
Pulse Gen Serial Number: 7136426

## 2020-12-27 DIAGNOSIS — K4020 Bilateral inguinal hernia, without obstruction or gangrene, not specified as recurrent: Secondary | ICD-10-CM | POA: Diagnosis not present

## 2020-12-31 DIAGNOSIS — F4322 Adjustment disorder with anxiety: Secondary | ICD-10-CM | POA: Diagnosis not present

## 2021-01-03 NOTE — Progress Notes (Signed)
Remote ICD transmission.   

## 2021-01-06 DIAGNOSIS — Z23 Encounter for immunization: Secondary | ICD-10-CM | POA: Diagnosis not present

## 2021-01-07 DIAGNOSIS — F4322 Adjustment disorder with anxiety: Secondary | ICD-10-CM | POA: Diagnosis not present

## 2021-01-14 DIAGNOSIS — F4322 Adjustment disorder with anxiety: Secondary | ICD-10-CM | POA: Diagnosis not present

## 2021-01-20 NOTE — Progress Notes (Signed)
Electrophysiology Office Note Date: 01/20/2021  ID:  Robert Ochoa, DOB Aug 20, 1945, MRN [id number]  PCP: London Pepper, MD Primary Cardiologist: None Electrophysiologist: Thompson Grayer, MD   CC: Routine ICD follow-up  Robert Ochoa is a 75 y.o. male seen today for Thompson Grayer, MD for routine electrophysiology followup.  Since last being seen in our clinic the patient reports doing very well.  he denies chest pain, palpitations, dyspnea, PND, orthopnea, nausea, vomiting, dizziness, syncope, edema, weight gain, or early satiety. He has not had ICD shocks.   Device History: StMudlogger ICD implanted 2010, gen changed 2014 for ARVD   Past Medical History:  Diagnosis Date   Arrhythmogenic right ventricular dysplas ([place])    Arrhythmogenic right ventricular dysplasia ([place])    Hx of VT s/p dual-chamber St. Jude ICD. Cardiac MRI 2010 showed moderately dilated RV with hypokinesis, LV EF 47%. EF 50-55% 2010 but down to 45% 05/2011. Cath 01/2009 without significant CAD.   Atrial fibrillation or flutter    Paroxysmal. Started on Pradaxa 05/2011.   Cardiac defibrillator in place    LV dysfunction    per echo 10/2011; EF 35%  // Echo 2/18: EF 40-45, diff HK, Gr 1 DD, mild dilated aortic root, mod LAE, mod RVE, mild reduced RVSF, mod RAE   Prostate cancer ([place])    Septic shock(785.52) 10-27-11 cause unknown   with associated respiratory/renal failure/PAF/bradycardia   Ventricular tachycardia ([place])    ICD discharges 05/2011 for this & AF-RVR - started on amiodarone then.   Wears glasses    Past Surgical History:  Procedure Laterality Date   APPENDECTOMY  2002   CARDIAC DEFIBRILLATOR PLACEMENT  02/19/09    St,. Jude    IMPLANTABLE CARDIOVERTER DEFIBRILLATOR (ICD) GENERATOR CHANGE N/A 03/01/2013   Procedure: ICD GENERATOR CHANGE;  Surgeon: [name], MD;  Location: [facility] CATH LAB;  Service: Cardiovascular;  Laterality: N/A;   IMPLANTABLE CARDIOVERTER DEFIBRILLATOR GENERATOR CHANGE   03/01/13   for premature battery depletetion.  Now has a SJM Ellipse DR device placed by Dr Caryl Comes   PROSTATE BIOPSY  11/2019   [place] N/A 04/30/2020   Procedure: RADIOACTIVE SEED IMPLANT/BRACHYTHERAPY IMPLANT;  Surgeon: Ceasar Mons, MD;  Location: [facility];  Service: Urology;  Laterality: N/A;   SPACE OAR INSTILLATION N/A 04/30/2020   Procedure: SPACE OAR INSTILLATION;  Surgeon: Ceasar Mons, MD;  Location: [facility];  Service: Urology;  Laterality: N/A;    Current Outpatient Medications  Medication Sig Dispense Refill   amiodarone (PACERONE) 200 MG tablet TAKE 1/2 TABLET(100 MG) BY MOUTH DAILY 45 tablet 2   Calcium-Magnesium-Zinc (CAL-MAG-ZINC PO) Take 2 tablets by mouth daily.     ELIQUIS 5 MG TABS tablet TAKE 1 TABLET BY MOUTH TWICE DAILY 60 tablet 10   lisinopril (ZESTRIL) 10 MG tablet TAKE 2 TABLETS BY MOUTH EVERY DAY 180 tablet 3   metoprolol tartrate (LOPRESSOR) 25 MG tablet Take 3 tablets (75 mg total) by mouth 2 (two) times daily. 540 tablet 1   Omega-3 Fatty Acids (FISH OIL) 1000 MG CAPS Take 3 capsules by mouth daily.     oxybutynin (DITROPAN-XL) 5 MG 24 hr tablet Take 5 mg by mouth daily.     sildenafil (VIAGRA) 100 MG tablet Take 1 tablet (100 mg total) by mouth daily as needed for erectile dysfunction. 10 tablet 0   tamsulosin (FLOMAX) 0.4 MG CAPS capsule Take 0.4 mg by mouth daily.     vitamin  C (ASCORBIC ACID) 500 MG tablet Take 1,000 mg by mouth daily.     No current facility-administered medications for this visit.    Allergies:   Patient has no known allergies.   Social History: Social History   Socioeconomic History   Marital status: Single    Spouse name: Not on file   Number of children: 1   Years of education: Not on file   Highest education level: Not on file  Occupational History    Employer: [employer]    Comment: Psychologist, sport and exercise  Tobacco Use   Smoking status: Never    Smokeless tobacco: Never  Vaping Use   Vaping Use: Never used  Substance and Sexual Activity   Alcohol use: Yes    Alcohol/week: 1.0 standard drink    Types: 1 Cans of beer per week    Comment: monthly   Drug use: No   Sexual activity: Yes    Birth control/protection: Coitus interruptus  Other Topics Concern   Not on file  Social History Narrative   Divorced w 1 child. Self employed.    Social Determinants of Health   Financial Resource Strain: Not on file  Food Insecurity: Not on file  Transportation Needs: Not on file  Physical Activity: Not on file  Stress: Not on file  Social Connections: Not on file  Intimate Partner Violence: Not on file    Family History: Family History  Problem Relation Age of Onset   Heart attack Father    Allergies Mother    Cancer Paternal Aunt        type unknown   Prostate cancer Cousin    Colon cancer Neg Hx    Pancreatic cancer Neg Hx    Breast cancer Neg Hx     Review of Systems: All other systems reviewed and are otherwise negative except as noted above.   Physical Exam: There were no vitals filed for this visit.   GEN- The patient is well appearing, alert and oriented x 3 today.   HEENT: normocephalic, atraumatic; sclera clear, conjunctiva pink; hearing intact; oropharynx clear; neck supple, no JVP Lymph- no cervical lymphadenopathy Lungs- Clear to ausculation bilaterally, normal work of breathing.  No wheezes, rales, rhonchi Heart- Regular rate and rhythm, no murmurs, rubs or gallops, PMI not laterally displaced GI- soft, non-tender, non-distended, bowel sounds present, no hepatosplenomegaly Extremities- no clubbing or cyanosis. No edema; DP/PT/radial pulses 2+ bilaterally MS- no significant deformity or atrophy Skin- warm and dry, no rash or lesion; ICD pocket well healed Psych- euthymic mood, full affect Neuro- strength and sensation are intact  ICD interrogation- reviewed in detail today,  See PACEART report  EKG:   EKG is ordered today.  Recent Labs: 04/26/2020: BUN 20; Creatinine, Ser 1.00; Potassium 4.7; Sodium 137 05/17/2020: ALT 14; TSH 0.723   Wt Readings from Last 3 Encounters:  07/31/20 213 lb 3.2 oz (96.7 kg)  05/23/20 209 lb 8 oz (95 kg)  05/17/20 206 lb 3.2 oz (93.5 kg)     Other studies Reviewed: Additional studies/ records that were reviewed today include: Previous EP office notes.   Assessment and Plan:  1.  ARVD s/p St. Jude dual chamber ICD  EF normal on most recent echo.  euvolemic today Stable on an appropriate medical regimen Normal ICD function See Pace Art report No changes today  2. VT Currently well controlled with low dose amiodarone Follow closely for toxicity. PFTs 10/2019 per Dr. Elsworth Soho Surveillance labs today.   With  ARVD, would likely not consider downgrade despite last treated VT in 2013 per patient.  3. AF Overall well controlled Continue eliquis for stroke prevention  4. Chronic lung disease Follows with Dr. Elsworth Soho  Current medicines are reviewed at length with the patient today.    Labs/ tests ordered today include:  Orders Placed This Encounter  Procedures   Comprehensive metabolic panel   TSH   CBC   CUP PACEART INCLINIC DEVICE CHECK   ECHOCARDIOGRAM COMPLETE    Disposition:   Follow up with  EP MD  in  4-5 months to discuss gen change. Had initially discussed possibility of down grade given no treated VT since 2013 per patient. He leaned towards keeping ICD, and with ARVD this is likely most appropriate course.      Jacalyn Lefevre, PA-C  01/20/2021 3:35 PM  [place] [address] [phone] (office) [phone] (fax)

## 2021-01-21 ENCOUNTER — Ambulatory Visit (INDEPENDENT_AMBULATORY_CARE_PROVIDER_SITE_OTHER): Payer: Medicare Other | Admitting: Student

## 2021-01-21 ENCOUNTER — Encounter: Payer: Self-pay | Admitting: Student

## 2021-01-21 ENCOUNTER — Other Ambulatory Visit: Payer: Self-pay

## 2021-01-21 VITALS — BP 110/70 | HR 60 | Ht 74.0 in | Wt 212.0 lb

## 2021-01-21 DIAGNOSIS — I428 Other cardiomyopathies: Secondary | ICD-10-CM

## 2021-01-21 DIAGNOSIS — F4322 Adjustment disorder with anxiety: Secondary | ICD-10-CM | POA: Diagnosis not present

## 2021-01-21 DIAGNOSIS — I480 Paroxysmal atrial fibrillation: Secondary | ICD-10-CM | POA: Diagnosis not present

## 2021-01-21 DIAGNOSIS — Q208 Other congenital malformations of cardiac chambers and connections: Secondary | ICD-10-CM | POA: Diagnosis not present

## 2021-01-21 DIAGNOSIS — I4729 Other ventricular tachycardia: Secondary | ICD-10-CM | POA: Diagnosis not present

## 2021-01-21 LAB — COMPREHENSIVE METABOLIC PANEL
ALT: 19 IU/L (ref 0–44)
AST: 23 IU/L (ref 0–40)
Albumin/Globulin Ratio: 1.8 (ref 1.2–2.2)
Albumin: 4.4 g/dL (ref 3.7–4.7)
Alkaline Phosphatase: 75 IU/L (ref 44–121)
BUN/Creatinine Ratio: 18 (ref 10–24)
BUN: 21 mg/dL (ref 8–27)
Bilirubin Total: 0.8 mg/dL (ref 0.0–1.2)
CO2: 22 mmol/L (ref 20–29)
Calcium: 9.5 mg/dL (ref 8.6–10.2)
Chloride: 103 mmol/L (ref 96–106)
Creatinine, Ser: 1.15 mg/dL (ref 0.76–1.27)
Globulin, Total: 2.5 g/dL (ref 1.5–4.5)
Glucose: 93 mg/dL (ref 70–99)
Potassium: 5.1 mmol/L (ref 3.5–5.2)
Sodium: 137 mmol/L (ref 134–144)
Total Protein: 6.9 g/dL (ref 6.0–8.5)
eGFR: 66 mL/min/{1.73_m2} (ref >59)

## 2021-01-21 LAB — CUP PACEART INCLINIC DEVICE CHECK
Battery Remaining Longevity: 4 mo
Brady Statistic RA Percent Paced: 90 %
Brady Statistic RV Percent Paced: 24 %
Date Time Interrogation Session: 20221025204647
HighPow Impedance: 55.4063
Implantable Lead Implant Date: 20101123
Implantable Lead Implant Date: 20101123
Implantable Lead Location: 753859
Implantable Lead Location: 753860
Implantable Lead Model: 7121
Implantable Pulse Generator Implant Date: 20141203
Lead Channel Impedance Value: 362.5 Ohm
Lead Channel Impedance Value: 437.5 Ohm
Lead Channel Pacing Threshold Amplitude: 0.75 V
Lead Channel Pacing Threshold Amplitude: 0.75 V
Lead Channel Pacing Threshold Amplitude: 1.25 V
Lead Channel Pacing Threshold Amplitude: 1.25 V
Lead Channel Pacing Threshold Pulse Width: 0.5 ms
Lead Channel Pacing Threshold Pulse Width: 0.5 ms
Lead Channel Pacing Threshold Pulse Width: 0.8 ms
Lead Channel Pacing Threshold Pulse Width: 0.8 ms
Lead Channel Sensing Intrinsic Amplitude: 8.6 mV
Lead Channel Setting Pacing Amplitude: 2.5 V
Lead Channel Setting Pacing Amplitude: 2.5 V
Lead Channel Setting Pacing Pulse Width: 0.8 ms
Lead Channel Setting Sensing Sensitivity: 0.5 mV
Pulse Gen Serial Number: 7136426

## 2021-01-21 LAB — CBC
Hematocrit: 49 % (ref 37.5–51.0)
Hemoglobin: 16.1 g/dL (ref 13.0–17.7)
MCH: 29.2 pg (ref 26.6–33.0)
MCHC: 32.9 g/dL (ref 31.5–35.7)
MCV: 89 fL (ref 79–97)
Platelets: 171 10*3/uL (ref 150–450)
RBC: 5.52 x10E6/uL (ref 4.14–5.80)
RDW: 13 % (ref 11.6–15.4)
WBC: 5.8 10*3/uL (ref 3.4–10.8)

## 2021-01-21 LAB — TSH: TSH: 1.09 u[IU]/mL (ref 0.450–4.500)

## 2021-01-21 NOTE — Patient Instructions (Signed)
Medication Instructions:  Your physician recommends that you continue on your current medications as directed. Please refer to the Current Medication list given to you today.  *If you need a refill on your cardiac medications before your next appointment, please call your pharmacy*   Lab Work: TODAY: CMET, TSH, CBC  If you have labs (blood work) drawn today and your tests are completely normal, you will receive your results only by: [place] (if you have MyChart) OR A paper copy in the mail If you have any lab test that is abnormal or we need to change your treatment, we will call you to review the results.   Testing/Procedures: Your physician has requested that you have an echocardiogram. Echocardiography is a painless test that uses sound waves to create images of your heart. It provides your doctor with information about the size and shape of your heart and how well your heart's chambers and valves are working. This procedure takes approximately one hour. There are no restrictions for this procedure.   Follow-Up: At [facility], you and your health needs are our priority.  As part of our continuing mission to provide you with exceptional heart care, we have created designated Provider Care Teams.  These Care Teams include your primary Cardiologist (physician) and Advanced Practice Providers (APPs -  Physician Assistants and Nurse Practitioners) who all work together to provide you with the care you need, when you need it.   Your next appointment:   5 month(s)  The format for your next appointment:   In Person  Provider:   Dr Caryl Comes

## 2021-01-24 ENCOUNTER — Other Ambulatory Visit: Payer: Self-pay | Admitting: Internal Medicine

## 2021-01-27 ENCOUNTER — Ambulatory Visit (INDEPENDENT_AMBULATORY_CARE_PROVIDER_SITE_OTHER): Payer: Medicare Other

## 2021-01-27 DIAGNOSIS — I428 Other cardiomyopathies: Secondary | ICD-10-CM

## 2021-01-27 LAB — CUP PACEART REMOTE DEVICE CHECK
Battery Remaining Longevity: 1 mo
Battery Remaining Percentage: 3 %
Battery Voltage: 2.6 V
Brady Statistic AP VP Percent: 18 %
Brady Statistic AP VS Percent: 77 %
Brady Statistic AS VP Percent: 1 %
Brady Statistic AS VS Percent: 2.9 %
Brady Statistic RA Percent Paced: 92 %
Brady Statistic RV Percent Paced: 19 %
Date Time Interrogation Session: 20221030020018
HighPow Impedance: 54 Ohm
HighPow Impedance: 54 Ohm
Implantable Lead Implant Date: 20101123
Implantable Lead Implant Date: 20101123
Implantable Lead Location: 753859
Implantable Lead Location: 753860
Implantable Lead Model: 7121
Implantable Pulse Generator Implant Date: 20141203
Lead Channel Impedance Value: 360 Ohm
Lead Channel Impedance Value: 430 Ohm
Lead Channel Pacing Threshold Amplitude: 0.75 V
Lead Channel Pacing Threshold Amplitude: 1.25 V
Lead Channel Pacing Threshold Pulse Width: 0.5 ms
Lead Channel Pacing Threshold Pulse Width: 0.8 ms
Lead Channel Sensing Intrinsic Amplitude: 2.1 mV
Lead Channel Sensing Intrinsic Amplitude: 7.7 mV
Lead Channel Setting Pacing Amplitude: 2.5 V
Lead Channel Setting Pacing Amplitude: 2.5 V
Lead Channel Setting Pacing Pulse Width: 0.8 ms
Lead Channel Setting Sensing Sensitivity: 0.5 mV
Pulse Gen Serial Number: 7136426

## 2021-01-28 DIAGNOSIS — F4322 Adjustment disorder with anxiety: Secondary | ICD-10-CM | POA: Diagnosis not present

## 2021-02-04 DIAGNOSIS — F4322 Adjustment disorder with anxiety: Secondary | ICD-10-CM | POA: Diagnosis not present

## 2021-02-04 NOTE — Progress Notes (Signed)
Remote ICD transmission.   

## 2021-02-06 ENCOUNTER — Ambulatory Visit (INDEPENDENT_AMBULATORY_CARE_PROVIDER_SITE_OTHER): Payer: Medicare Other | Admitting: Podiatry

## 2021-02-06 ENCOUNTER — Other Ambulatory Visit: Payer: Self-pay

## 2021-02-06 ENCOUNTER — Encounter: Payer: Self-pay | Admitting: Podiatry

## 2021-02-06 DIAGNOSIS — M7751 Other enthesopathy of right foot: Secondary | ICD-10-CM

## 2021-02-06 DIAGNOSIS — D2371 Other benign neoplasm of skin of right lower limb, including hip: Secondary | ICD-10-CM

## 2021-02-06 MED ORDER — DEXAMETHASONE SODIUM PHOSPHATE 120 MG/30ML IJ SOLN
2.0000 mg | Freq: Once | INTRAMUSCULAR | Status: AC
  Administered 2021-02-06: 2 mg via INTRA_ARTICULAR

## 2021-02-06 NOTE — Progress Notes (Signed)
He presents today for follow-up of bursitis right foot.  He says I had an appointment to follow-up and it was perfect timing because he just tarted hurt the other day.  Objective: Vital signs are stable he is alert and oriented x3 no erythema edema/drainage odor he has reactive hyper keratoma subfifth metatarsal head of the right foot with underlying bursitis.  Assessment: Bursitis and benign skin lesion right 7/5 met head.  Plan: Injected the bursitis today 2 mg dexamethasone local anesthetic debrided the benign skin lesion.  Follow-up with me as needed.

## 2021-02-11 DIAGNOSIS — F4322 Adjustment disorder with anxiety: Secondary | ICD-10-CM | POA: Diagnosis not present

## 2021-02-14 ENCOUNTER — Other Ambulatory Visit: Payer: Self-pay

## 2021-02-14 ENCOUNTER — Ambulatory Visit (HOSPITAL_COMMUNITY): Payer: Medicare Other | Attending: Internal Medicine

## 2021-02-14 DIAGNOSIS — I4729 Other ventricular tachycardia: Secondary | ICD-10-CM | POA: Insufficient documentation

## 2021-02-14 DIAGNOSIS — I480 Paroxysmal atrial fibrillation: Secondary | ICD-10-CM

## 2021-02-14 DIAGNOSIS — I428 Other cardiomyopathies: Secondary | ICD-10-CM | POA: Diagnosis not present

## 2021-02-14 LAB — ECHOCARDIOGRAM COMPLETE
Area-P 1/2: 2.54 cm2
S' Lateral: 3.1 cm

## 2021-02-18 DIAGNOSIS — F4322 Adjustment disorder with anxiety: Secondary | ICD-10-CM | POA: Diagnosis not present

## 2021-02-22 ENCOUNTER — Telehealth: Payer: Self-pay | Admitting: Cardiology

## 2021-02-22 DIAGNOSIS — Z01818 Encounter for other preprocedural examination: Secondary | ICD-10-CM

## 2021-02-22 DIAGNOSIS — I420 Dilated cardiomyopathy: Secondary | ICD-10-CM

## 2021-02-22 NOTE — Telephone Encounter (Signed)
Pt called with ICD buzzing.  I had him send in remote tracing and contacted [place] with [employer]/St. Jude.   The device had reached ERI and has 3 months to be replaced.  I informed pt and will send message to Dr. Rayann Heman.

## 2021-02-23 NOTE — Telephone Encounter (Signed)
ERI noted  Ok to schedule generator change with me as schedule allows.  Hold eliquis 24 hours prior to the procedure.

## 2021-02-25 DIAGNOSIS — F4322 Adjustment disorder with anxiety: Secondary | ICD-10-CM | POA: Diagnosis not present

## 2021-02-26 DIAGNOSIS — Z23 Encounter for immunization: Secondary | ICD-10-CM | POA: Diagnosis not present

## 2021-02-26 LAB — CUP PACEART REMOTE DEVICE CHECK
Battery Remaining Longevity: 0 mo
Battery Voltage: 2.59 V
Brady Statistic AP VP Percent: 18 %
Brady Statistic AP VS Percent: 77 %
Brady Statistic AS VP Percent: 1 %
Brady Statistic AS VS Percent: 3.3 %
Brady Statistic RA Percent Paced: 92 %
Brady Statistic RV Percent Paced: 19 %
Date Time Interrogation Session: 20221130034014
HighPow Impedance: 51 Ohm
HighPow Impedance: 51 Ohm
Implantable Lead Implant Date: 20101123
Implantable Lead Implant Date: 20101123
Implantable Lead Location: 753859
Implantable Lead Location: 753860
Implantable Lead Model: 7121
Implantable Pulse Generator Implant Date: 20141203
Lead Channel Impedance Value: 340 Ohm
Lead Channel Impedance Value: 400 Ohm
Lead Channel Pacing Threshold Amplitude: 0.75 V
Lead Channel Pacing Threshold Amplitude: 1.25 V
Lead Channel Pacing Threshold Pulse Width: 0.5 ms
Lead Channel Pacing Threshold Pulse Width: 0.8 ms
Lead Channel Sensing Intrinsic Amplitude: 2.4 mV
Lead Channel Sensing Intrinsic Amplitude: 7.9 mV
Lead Channel Setting Pacing Amplitude: 2.5 V
Lead Channel Setting Pacing Amplitude: 2.5 V
Lead Channel Setting Pacing Pulse Width: 0.8 ms
Lead Channel Setting Sensing Sensitivity: 0.5 mV
Pulse Gen Serial Number: 7136426

## 2021-02-27 ENCOUNTER — Ambulatory Visit (INDEPENDENT_AMBULATORY_CARE_PROVIDER_SITE_OTHER): Payer: Medicare Other

## 2021-02-27 ENCOUNTER — Telehealth: Payer: Self-pay

## 2021-02-27 DIAGNOSIS — I428 Other cardiomyopathies: Secondary | ICD-10-CM

## 2021-02-27 NOTE — Telephone Encounter (Signed)
Nonbillable Scheduled remote reviewed. Normal device function.   The device has reached the elective replacement indicator on 02/22/2021, sent to triage.  Patient called and advised battery reached ERI. Need to have apt with EP MD to discuss gen change. Advised I will forward to scheduling and someone will call to make apt. Verbalized understanding.

## 2021-02-28 ENCOUNTER — Encounter: Payer: Self-pay | Admitting: *Deleted

## 2021-02-28 NOTE — Addendum Note (Signed)
Addended by: Darrell Jewel on: 02/28/2021 10:48 AM   Modules accepted: Orders

## 2021-02-28 NOTE — Telephone Encounter (Signed)
Patient will have his battery change on his ICD with Dr. Rayann Heman on December 29. Lab day with be Dec 15. Will meet with nurse that day for instructions and pre op scrub.  Patient verbalized agreement and understanding.

## 2021-03-04 DIAGNOSIS — F4322 Adjustment disorder with anxiety: Secondary | ICD-10-CM | POA: Diagnosis not present

## 2021-03-11 DIAGNOSIS — F4322 Adjustment disorder with anxiety: Secondary | ICD-10-CM | POA: Diagnosis not present

## 2021-03-11 NOTE — Progress Notes (Signed)
Remote ICD transmission.   

## 2021-03-13 ENCOUNTER — Other Ambulatory Visit: Payer: Self-pay

## 2021-03-13 ENCOUNTER — Other Ambulatory Visit: Payer: Medicare Other

## 2021-03-13 DIAGNOSIS — Z01818 Encounter for other preprocedural examination: Secondary | ICD-10-CM

## 2021-03-13 DIAGNOSIS — I420 Dilated cardiomyopathy: Secondary | ICD-10-CM | POA: Diagnosis not present

## 2021-03-13 LAB — CBC WITH DIFFERENTIAL/PLATELET
Basophils Absolute: 0 10*3/uL (ref 0.0–0.2)
Basos: 1 %
EOS (ABSOLUTE): 0.2 10*3/uL (ref 0.0–0.4)
Eos: 3 %
Hematocrit: 47.8 % (ref 37.5–51.0)
Hemoglobin: 15.9 g/dL (ref 13.0–17.7)
Lymphocytes Absolute: 0.9 10*3/uL (ref 0.7–3.1)
Lymphs: 15 %
MCH: 29.2 pg (ref 26.6–33.0)
MCHC: 33.3 g/dL (ref 31.5–35.7)
MCV: 88 fL (ref 79–97)
Monocytes Absolute: 0.8 10*3/uL (ref 0.1–0.9)
Monocytes: 15 %
Neutrophils Absolute: 3.8 10*3/uL (ref 1.4–7.0)
Neutrophils: 66 %
Platelets: 181 10*3/uL (ref 150–450)
RBC: 5.45 x10E6/uL (ref 4.14–5.80)
RDW: 13.7 % (ref 11.6–15.4)
WBC: 5.6 10*3/uL (ref 3.4–10.8)

## 2021-03-13 LAB — BASIC METABOLIC PANEL
BUN/Creatinine Ratio: 21 (ref 10–24)
BUN: 22 mg/dL (ref 8–27)
CO2: 26 mmol/L (ref 20–29)
Calcium: 9.4 mg/dL (ref 8.6–10.2)
Chloride: 105 mmol/L (ref 96–106)
Creatinine, Ser: 1.07 mg/dL (ref 0.76–1.27)
Glucose: 79 mg/dL (ref 70–99)
Potassium: 4.8 mmol/L (ref 3.5–5.2)
Sodium: 138 mmol/L (ref 134–144)
eGFR: 72 mL/min/{1.73_m2} (ref >59)

## 2021-03-18 DIAGNOSIS — F4322 Adjustment disorder with anxiety: Secondary | ICD-10-CM | POA: Diagnosis not present

## 2021-03-26 DIAGNOSIS — C61 Malignant neoplasm of prostate: Secondary | ICD-10-CM | POA: Diagnosis not present

## 2021-03-26 NOTE — Pre-Procedure Instructions (Signed)
Attempted to call patient regarding procedure instructions.  Left voice mail on the following items: Arrival time 1130 Nothing to eat or drink after midnight No meds AM of procedure Responsible person to drive you home and stay with you for 24 hrs Wash with special soap night before and morning of procedure If on anti-coagulant drug instructions Eliquis- stop 24 hours prior to procedure

## 2021-03-27 ENCOUNTER — Other Ambulatory Visit: Payer: Self-pay

## 2021-03-27 ENCOUNTER — Ambulatory Visit (HOSPITAL_COMMUNITY)
Admission: RE | Admit: 2021-03-27 | Discharge: 2021-03-27 | Disposition: A | Discharge status: Home / Self Care | Payer: Medicare Other | Attending: Internal Medicine | Admitting: Internal Medicine

## 2021-03-27 ENCOUNTER — Encounter (HOSPITAL_COMMUNITY)
Admission: RE | Disposition: A | Discharge status: Home / Self Care | Payer: Self-pay | Source: Home / Self Care | Attending: Internal Medicine

## 2021-03-27 DIAGNOSIS — I4892 Unspecified atrial flutter: Secondary | ICD-10-CM | POA: Insufficient documentation

## 2021-03-27 DIAGNOSIS — I480 Paroxysmal atrial fibrillation: Secondary | ICD-10-CM | POA: Diagnosis not present

## 2021-03-27 DIAGNOSIS — I495 Sick sinus syndrome: Secondary | ICD-10-CM | POA: Diagnosis not present

## 2021-03-27 DIAGNOSIS — I428 Other cardiomyopathies: Secondary | ICD-10-CM | POA: Insufficient documentation

## 2021-03-27 DIAGNOSIS — I509 Heart failure, unspecified: Secondary | ICD-10-CM | POA: Insufficient documentation

## 2021-03-27 DIAGNOSIS — Z4502 Encounter for adjustment and management of automatic implantable cardiac defibrillator: Secondary | ICD-10-CM | POA: Diagnosis not present

## 2021-03-27 DIAGNOSIS — I472 Ventricular tachycardia, unspecified: Secondary | ICD-10-CM | POA: Insufficient documentation

## 2021-03-27 HISTORY — PX: ICD GENERATOR CHANGEOUT: EP1231

## 2021-03-27 SURGERY — ICD GENERATOR CHANGEOUT

## 2021-03-27 MED ORDER — LIDOCAINE HCL (PF) 1 % IJ SOLN
INTRAMUSCULAR | Status: DC | PRN
  Administered 2021-03-27: 60 mL

## 2021-03-27 MED ORDER — SODIUM CHLORIDE 0.9% FLUSH: 3.0000 mL | INTRAVENOUS | Status: DC | PRN

## 2021-03-27 MED ORDER — SODIUM CHLORIDE 0.9 % IV SOLN
INTRAVENOUS | Status: AC
  Filled 2021-03-27: qty 2

## 2021-03-27 MED ORDER — SODIUM CHLORIDE 0.9 % IV SOLN: INTRAVENOUS | Status: DC

## 2021-03-27 MED ORDER — FENTANYL CITRATE (PF) 100 MCG/2ML IJ SOLN
INTRAMUSCULAR | Status: AC
  Filled 2021-03-27: qty 2

## 2021-03-27 MED ORDER — CHLORHEXIDINE GLUCONATE 4 % EX LIQD
4.0000 "application " | Freq: Once | CUTANEOUS | Status: DC
  Filled 2021-03-27: qty 60

## 2021-03-27 MED ORDER — ACETAMINOPHEN 325 MG PO TABS
325.0000 mg | ORAL_TABLET | ORAL | Status: DC | PRN
  Filled 2021-03-27: qty 2

## 2021-03-27 MED ORDER — ONDANSETRON HCL 4 MG/2ML IJ SOLN: 4.0000 mg | Freq: Four times a day (QID) | INTRAMUSCULAR | Status: DC | PRN

## 2021-03-27 MED ORDER — SODIUM CHLORIDE 0.9% FLUSH: 3.0000 mL | Freq: Two times a day (BID) | INTRAVENOUS | Status: DC

## 2021-03-27 MED ORDER — POVIDONE-IODINE 10 % EX SWAB
2.0000 "application " | Freq: Once | CUTANEOUS | Status: AC
  Administered 2021-03-27: 2 via TOPICAL

## 2021-03-27 MED ORDER — SODIUM CHLORIDE 0.9 % IV SOLN
80.0000 mg | INTRAVENOUS | Status: AC
  Administered 2021-03-27: 14:00:00 80 mg
  Filled 2021-03-27: qty 2

## 2021-03-27 MED ORDER — FENTANYL CITRATE (PF) 100 MCG/2ML IJ SOLN
INTRAMUSCULAR | Status: DC | PRN
  Administered 2021-03-27: 12.5 ug via INTRAVENOUS

## 2021-03-27 MED ORDER — MIDAZOLAM HCL 5 MG/5ML IJ SOLN
INTRAMUSCULAR | Status: AC
  Filled 2021-03-27: qty 5

## 2021-03-27 MED ORDER — LIDOCAINE HCL (PF) 1 % IJ SOLN
INTRAMUSCULAR | Status: AC
  Filled 2021-03-27: qty 90

## 2021-03-27 MED ORDER — CEFAZOLIN SODIUM-DEXTROSE 2-4 GM/100ML-% IV SOLN
2.0000 g | INTRAVENOUS | Status: AC
  Administered 2021-03-27: 13:00:00 2 g via INTRAVENOUS
  Filled 2021-03-27: qty 100

## 2021-03-27 MED ORDER — MIDAZOLAM HCL 5 MG/5ML IJ SOLN
INTRAMUSCULAR | Status: DC | PRN
  Administered 2021-03-27: 1 mg via INTRAVENOUS

## 2021-03-27 MED ORDER — CEFAZOLIN SODIUM-DEXTROSE 2-4 GM/100ML-% IV SOLN
INTRAVENOUS | Status: AC
  Filled 2021-03-27: qty 100

## 2021-03-27 MED ORDER — SODIUM CHLORIDE 0.9 % IV SOLN: 250.0000 mL | INTRAVENOUS | Status: DC | PRN

## 2021-03-27 SURGICAL SUPPLY — 4 items
CABLE SURGICAL S-101-97-12 (CABLE) ×2 IMPLANT
ICD ELLIPSE DR CD2411-36C (ICD Generator) ×1 IMPLANT
PAD DEFIB RADIO PHYSIO CONN (PAD) ×2 IMPLANT
TRAY PACEMAKER INSERTION (PACKS) ×2 IMPLANT

## 2021-03-27 NOTE — H&P (Signed)
Robert Ochoa is a 75 y.o. male who presents today for ICD generator change.  Since last being seen in our clinic, the patient reports doing very well.  Today, he denies symptoms of palpitations, chest pain, shortness of breath,  lower extremity edema, dizziness, presyncope, syncope, or ICD shocks.  The patient is otherwise without complaint today.        Past Medical History:  Diagnosis Date   Arrhythmogenic right ventricular dysplas [facility])     Arrhythmogenic right ventricular dysplasia (HCC)      Hx of VT s/p dual-chamber St. Jude ICD. Cardiac MRI 2010 showed moderately dilated RV with hypokinesis, LV EF 47%. EF 50-55% 2010 but down to 45% 05/2011. Cath 01/2009 without significant CAD.   Atrial fibrillation or flutter      Paroxysmal. Started on Pradaxa 05/2011.   Cardiac defibrillator in place     LV dysfunction      per echo 10/2011; EF 35%  // Echo 2/18: EF 40-45, diff HK, Gr 1 DD, mild dilated aortic root, mod LAE, mod RVE, mild reduced RVSF, mod RAE   Prostate cancer ([place])     Septic shock(785.52) 10-27-11 cause unknown    with associated respiratory/renal failure/PAF/bradycardia   Ventricular tachycardia ([place])      ICD discharges 05/2011 for this & AF-RVR - started on amiodarone then.   Wears glasses           Past Surgical History:  Procedure Laterality Date   APPENDECTOMY   2002   CARDIAC DEFIBRILLATOR PLACEMENT   02/19/09     St,. Jude    IMPLANTABLE CARDIOVERTER DEFIBRILLATOR (ICD) GENERATOR CHANGE N/A 03/01/2013    Procedure: ICD GENERATOR CHANGE;  Surgeon: [name], MD;  Location: [facility] CATH LAB;  Service: Cardiovascular;  Laterality: N/A;   IMPLANTABLE CARDIOVERTER DEFIBRILLATOR GENERATOR CHANGE   03/01/13    for premature battery depletetion.  Now has a SJM Ellipse DR device placed by Dr Caryl Comes   PROSTATE BIOPSY   11/2019   [place] N/A 04/30/2020    Procedure: RADIOACTIVE SEED IMPLANT/BRACHYTHERAPY IMPLANT;  Surgeon: Ceasar Mons, MD;  Location:  [facility];  Service: Urology;  Laterality: N/A;   SPACE OAR INSTILLATION N/A 04/30/2020    Procedure: SPACE OAR INSTILLATION;  Surgeon: Ceasar Mons, MD;  Location: [facility];  Service: Urology;  Laterality: N/A;      ROS- all systems are reviewed and negative except as per HPI above         Current Outpatient Medications  Medication Sig Dispense Refill   amiodarone (PACERONE) 200 MG tablet TAKE 1/2 TABLET(100 MG) BY MOUTH DAILY 45 tablet 2   Calcium-Magnesium-Zinc (CAL-MAG-ZINC PO) Take 2 tablets by mouth daily.       ELIQUIS 5 MG TABS tablet TAKE 1 TABLET BY MOUTH TWICE DAILY 60 tablet 10   ibuprofen (ADVIL) 200 MG tablet Take 200 mg by mouth every 6 (six) hours as needed for mild pain.       lisinopril (ZESTRIL) 10 MG tablet TAKE 2 TABLETS BY MOUTH EVERY DAY 180 tablet 3   metoprolol tartrate (LOPRESSOR) 25 MG tablet TAKE 3 TABLETS BY MOUTH TWICE DAILY 540 tablet 3   Omega-3 Fatty Acids (FISH OIL) 1000 MG CAPS Take 3 capsules by mouth daily.       oxybutynin (DITROPAN-XL) 5 MG 24 hr tablet Take 5 mg by mouth daily.       sildenafil (VIAGRA) 100 MG tablet Take 1 tablet (100  mg total) by mouth daily as needed for erectile dysfunction. 10 tablet 0   tamsulosin (FLOMAX) 0.4 MG CAPS capsule Take 0.4 mg by mouth daily.       vitamin C (ASCORBIC ACID) 500 MG tablet Take 1,000 mg by mouth daily.        No current facility-administered medications for this visit.      Physical Exam: Vitals:   03/27/21 1127  BP: 113/83  Pulse: 73  Resp: 18  Temp: 97.7 F (36.5 C)  SpO2: 98%    GEN- The patient is well appearing, alert and oriented x 3 today.   Head- normocephalic, atraumatic Eyes-  Sclera clear, conjunctiva pink Ears- hearing intact Oropharynx- clear Lungs- Clear to ausculation bilaterally, normal work of breathing Chest- ICD pocket is well healed Heart- Regular rate and rhythm, no murmurs, rubs or gallops, PMI not laterally  displaced GI- soft, NT, ND, + BS Extremities- no clubbing, cyanosis, or edema    Assessment and Plan:   1. ARVD with prior VT ICD is at [facility]  Risks, benefits, and alternatives to ICD pulse generator replacement were discussed in detail today.  The patient understands that risks include but are not limited to bleeding, infection, pneumothorax, perforation, tamponade, vascular damage, renal failure, MI, stroke, death, inappropriate shocks, damage to his existing leads, and lead dislodgement and wishes to proceed.   QRS is narrow.  PR is long but chronically stable.  I would not advise upgrade to CRT at this time.  We will therefore schedule the procedure at the next available time.  ICD Criteria  Current LVEF:55%. Within 12 months prior to implant: Yes   Heart failure history: Yes, Class II  Cardiomyopathy history: Yes, Non-Ischemic Cardiomyopathy.  (ARVD)  Atrial Fibrillation/Atrial Flutter: yes paroxysmal afib  Ventricular tachycardia history: Yes, Hemodynamic instability present. VT Type: Sustained Ventricular Tachycardia - Monomorphic.  Cardiac arrest history: Yes, Ventricular Tachycardia.  History of syndromes with risk of sudden death: yes ARVD  Previous ICD: Yes, Reason for ICD:  Secondary prevention.  Current ICD indication: Secondary  PPM indication: Yes. Pacing type: Atrial. Greater than 40% RV pacing requirement anticipated. Indication: Sick Sinus Syndrome  Class I or II Bradycardia indication present: Yes  Beta Blocker therapy for 3 or more months: Yes, prescribed.   Ace Inhibitor/ARB therapy for 3 or more months: Yes, prescribed.    I have seen Robert Ochoa is a 75 y.o. male presents for consideration of ICD generator change for secondary prevention of sudden death.  The patient's chart has been reviewed and they meet criteria for ICD generator change.  I have had a thorough discussion with the patient reviewing options.  The patient has had opportunities to ask  questions and have them answered. The patient and I have decided together through the [place] [employer] to proceed with replacement of his ICD at this time.    Robert Grayer MD, [place] 03/27/2021 12:18 PM

## 2021-03-27 NOTE — Discharge Instructions (Addendum)
Resume eliquis on Sunday 03/30/21 am.  Implantable Cardiac Device Battery Change, Care After  This sheet gives you information about how to care for yourself after your procedure. Your health care provider may also give you more specific instructions. If you have problems or questions, contact your health care provider. What can I expect after the procedure? After your procedure, it is common to have: Pain or soreness at the site where the cardiac device was inserted. Swelling at the site where the cardiac device was inserted. You should received an information card for your new device in 4-8 weeks. Follow these instructions at home: Incision care  Keep the incision clean and dry. Do not take baths, swim, or use a hot tub until after your wound check.  Do not shower for at least 7 days, or as directed by your health care provider. Pat the area dry with a clean towel. Do not rub the area. This may cause bleeding. Follow instructions from your health care provider about how to take care of your incision. Make sure you: Leave stitches (sutures), skin glue, or adhesive strips in place. These skin closures may need to stay in place for 2 weeks or longer. If adhesive strip edges start to loosen and curl up, you may trim the loose edges. Do not remove adhesive strips completely unless your health care provider tells you to do that. Check your incision area every day for signs of infection. Check for: More redness, swelling, or pain. More fluid or blood. Warmth. Pus or a bad smell. Activity Do not lift anything that is heavier than 10 lb (4.5 kg) until your health care provider says it is okay to do so. For the first week, or as long as told by your health care provider: Avoid lifting your affected arm higher than your shoulder. After 1 week, Be gentle when you move your arms over your head. It is okay to raise your arm to comb your hair. Avoid strenuous exercise. Ask your health care provider  when it is okay to: Resume your normal activities. Return to work or school. Resume sexual activity. Eating and drinking Eat a heart-healthy diet. This should include plenty of fresh fruits and vegetables, whole grains, low-fat dairy products, and lean protein like chicken and fish. Limit alcohol intake to no more than 1 drink a day for non-pregnant women and 2 drinks a day for men. One drink equals 12 oz of beer, 5 oz of wine, or 1 oz of hard liquor. Check ingredients and nutrition facts on packaged foods and beverages. Avoid the following types of food: Food that is high in salt (sodium). Food that is high in saturated fat, like full-fat dairy or red meat. Food that is high in trans fat, like fried food. Food and drinks that are high in sugar. Lifestyle Do not use any products that contain nicotine or tobacco, such as cigarettes and e-cigarettes. If you need help quitting, ask your health care provider. Take steps to manage and control your weight. Once cleared, get regular exercise. Aim for 150 minutes of moderate-intensity exercise (such as walking or yoga) or 75 minutes of vigorous exercise (such as running or swimming) each week. Manage other health problems, such as diabetes or high blood pressure. Ask your health care provider how you can manage these conditions. General instructions Do not drive for 24 hours after your procedure if you were given a medicine to help you relax (sedative). Take over-the-counter and prescription medicines only as told by  your health care provider. Avoid putting pressure on the area where the cardiac device was placed. If you need an MRI after your cardiac device has been placed, be sure to tell the health care provider who orders the MRI that you have a cardiac device. Avoid close and prolonged exposure to electrical devices that have strong magnetic fields. These include: Cell phones. Avoid keeping them in a pocket near the cardiac device, and try using  the ear opposite the cardiac device. MP3 players. Household appliances, like microwaves. Metal detectors. Electric generators. High-tension wires. Keep all follow-up visits as directed by your health care provider. This is important. Contact a health care provider if: You have pain at the incision site that is not relieved by over-the-counter or prescription medicines. You have any of these around your incision site or coming from it: More redness, swelling, or pain. Fluid or blood. Warmth to the touch. Pus or a bad smell. You have a fever. You feel brief, occasional palpitations, light-headedness, or any symptoms that you think might be related to your heart. Get help right away if: You experience chest pain that is different from the pain at the cardiac device site. You develop a red streak that extends above or below the incision site. You experience shortness of breath. You have palpitations or an irregular heartbeat. You have light-headedness that does not go away quickly. You faint or have dizzy spells. Your pulse suddenly drops or increases rapidly and does not return to normal. You begin to gain weight and your legs and ankles swell. Summary After your procedure, it is common to have pain, soreness, and some swelling where the cardiac device was inserted. Make sure to keep your incision clean and dry. Follow instructions from your health care provider about how to take care of your incision. Check your incision every day for signs of infection, such as more pain or swelling, pus or a bad smell, warmth, or leaking fluid and blood. Avoid strenuous exercise and lifting your left arm higher than your shoulder for 2 weeks, or as long as told by your health care provider. This information is not intended to replace advice given to you by your health care provider. Make sure you discuss any questions you have with your health care provider.

## 2021-03-28 ENCOUNTER — Encounter (HOSPITAL_COMMUNITY): Payer: Self-pay | Admitting: Internal Medicine

## 2021-03-28 ENCOUNTER — Encounter: Payer: Medicare Other | Admitting: Internal Medicine

## 2021-04-01 DIAGNOSIS — F4322 Adjustment disorder with anxiety: Secondary | ICD-10-CM | POA: Diagnosis not present

## 2021-04-02 ENCOUNTER — Encounter (HOSPITAL_COMMUNITY): Payer: Self-pay | Admitting: Internal Medicine

## 2021-04-04 DIAGNOSIS — C61 Malignant neoplasm of prostate: Secondary | ICD-10-CM | POA: Diagnosis not present

## 2021-04-04 DIAGNOSIS — R3915 Urgency of urination: Secondary | ICD-10-CM | POA: Diagnosis not present

## 2021-04-04 DIAGNOSIS — N528 Other male erectile dysfunction: Secondary | ICD-10-CM | POA: Diagnosis not present

## 2021-04-08 DIAGNOSIS — F4322 Adjustment disorder with anxiety: Secondary | ICD-10-CM | POA: Diagnosis not present

## 2021-04-09 ENCOUNTER — Ambulatory Visit (INDEPENDENT_AMBULATORY_CARE_PROVIDER_SITE_OTHER): Payer: Medicare Other

## 2021-04-09 ENCOUNTER — Other Ambulatory Visit: Payer: Self-pay

## 2021-04-09 DIAGNOSIS — I480 Paroxysmal atrial fibrillation: Secondary | ICD-10-CM

## 2021-04-09 LAB — CUP PACEART INCLINIC DEVICE CHECK
Date Time Interrogation Session: 20230111140749
Pulse Gen Serial Number: 8907499

## 2021-04-09 NOTE — Patient Instructions (Addendum)
° °  After Your Pacemaker   Monitor your pacemaker site for redness, swelling, and drainage. Call the device clinic at [phone] if you experience these symptoms or fever/chills.  Keep Steri-strips/incision clean and dry until your next wound check.   You may drive, unless driving has been restricted by your healthcare providers.  Remote monitoring is used to monitor your pacemaker from home. This monitoring is scheduled every 91 days by our office. It allows [place] to keep an eye on the functioning of your device to ensure it is working properly. You will routinely see your Electrophysiologist annually (more often if necessary).

## 2021-04-09 NOTE — Progress Notes (Signed)
Wound check appointment. Steri-strips removed. Wound without redness or edema. Incision edges were well approximated with exception at the end of incision. Steri-strips reapplied and pt scheduled to come back in 1 week for wound recheck. Normal device function. Thresholds, sensing, and impedances consistent with implant measurements. Device programmed at chronic outputs. Histogram distribution appropriate for patient and level of activity. AT/AF burden 12%, EGM's appear AF. On [place]. No high ventricular rates noted. Patient educated about wound care, arm mobility. ROV in 3 months with implanting physician.

## 2021-04-10 DIAGNOSIS — L812 Freckles: Secondary | ICD-10-CM | POA: Diagnosis not present

## 2021-04-10 DIAGNOSIS — Z85828 Personal history of other malignant neoplasm of skin: Secondary | ICD-10-CM | POA: Diagnosis not present

## 2021-04-10 DIAGNOSIS — L821 Other seborrheic keratosis: Secondary | ICD-10-CM | POA: Diagnosis not present

## 2021-04-10 DIAGNOSIS — L570 Actinic keratosis: Secondary | ICD-10-CM | POA: Diagnosis not present

## 2021-04-10 DIAGNOSIS — L853 Xerosis cutis: Secondary | ICD-10-CM | POA: Diagnosis not present

## 2021-04-16 ENCOUNTER — Ambulatory Visit (INDEPENDENT_AMBULATORY_CARE_PROVIDER_SITE_OTHER): Payer: Medicare Other

## 2021-04-16 ENCOUNTER — Other Ambulatory Visit: Payer: Self-pay

## 2021-04-16 DIAGNOSIS — I4729 Other ventricular tachycardia: Secondary | ICD-10-CM

## 2021-04-16 NOTE — Patient Instructions (Addendum)
° °  After Your ICD (Implantable Cardiac Defibrillator)  Please continue to follow all instructions from your last weeks visit   Monitor your defibrillator site for redness, swelling, and drainage. Call the device clinic at [phone] if you experience these symptoms or fever/chills.  Your incision was closed with Steri-strips or staples:  You may shower and wash your incision with soap and water. Avoid lotions, ointments, or perfumes over your incision until it is well-healed.  You may use a hot tub or a pool after your wound check appointment if the incision is completely closed.     Your ICD is designed to protect you from life threatening heart rhythms. Because of this, you may receive a shock.   1 shock with no symptoms:  Call the office during business hours. 1 shock with symptoms (chest pain, chest pressure, dizziness, lightheadedness, shortness of breath, overall feeling unwell):  Call 911. If you experience 2 or more shocks in 24 hours:  Call 911. If you receive a shock, you should not drive.  [place] DMV - no driving for 6 months if you receive appropriate therapy from your ICD.   ICD Alerts:  Some alerts are vibratory and others beep. These are NOT emergencies. Please call our office to let us know. If this occurs at night or on weekends, it can wait until the next business day. Send a remote transmission.

## 2021-04-16 NOTE — Progress Notes (Signed)
Wound check appointment. Steri-strips removed from left distal end of incision. Wound without redness or edema. Incision edges were well approximated and completely healed. Patient advised he may now shower (see AVS for patient instructions). He is provided device clinic contact if any additional questions or concerns may arise.

## 2021-04-22 DIAGNOSIS — F4322 Adjustment disorder with anxiety: Secondary | ICD-10-CM | POA: Diagnosis not present

## 2021-04-27 ENCOUNTER — Other Ambulatory Visit: Payer: Self-pay | Admitting: Internal Medicine

## 2021-04-29 DIAGNOSIS — F4322 Adjustment disorder with anxiety: Secondary | ICD-10-CM | POA: Diagnosis not present

## 2021-05-06 DIAGNOSIS — F4322 Adjustment disorder with anxiety: Secondary | ICD-10-CM | POA: Diagnosis not present

## 2021-05-13 DIAGNOSIS — F4322 Adjustment disorder with anxiety: Secondary | ICD-10-CM | POA: Diagnosis not present

## 2021-05-20 DIAGNOSIS — F4322 Adjustment disorder with anxiety: Secondary | ICD-10-CM | POA: Diagnosis not present

## 2021-05-27 DIAGNOSIS — F4322 Adjustment disorder with anxiety: Secondary | ICD-10-CM | POA: Diagnosis not present

## 2021-06-10 DIAGNOSIS — F4322 Adjustment disorder with anxiety: Secondary | ICD-10-CM | POA: Diagnosis not present

## 2021-06-17 DIAGNOSIS — F4322 Adjustment disorder with anxiety: Secondary | ICD-10-CM | POA: Diagnosis not present

## 2021-06-24 DIAGNOSIS — F4322 Adjustment disorder with anxiety: Secondary | ICD-10-CM | POA: Diagnosis not present

## 2021-06-26 ENCOUNTER — Other Ambulatory Visit: Payer: Self-pay | Admitting: Internal Medicine

## 2021-06-26 DIAGNOSIS — I480 Paroxysmal atrial fibrillation: Secondary | ICD-10-CM

## 2021-06-26 NOTE — Telephone Encounter (Signed)
Eliquis '5mg'$  refill request received. Patient is 76 years old, weight-93.4kg, Crea-1.07 on 03/13/2021, Diagnosis-Afib, and last seen by Oda Kilts on 01/21/2021. Dose is appropriate based on dosing criteria. Will send in refill to requested pharmacy.   ?

## 2021-06-27 ENCOUNTER — Encounter: Payer: Self-pay | Admitting: Internal Medicine

## 2021-06-27 ENCOUNTER — Ambulatory Visit (INDEPENDENT_AMBULATORY_CARE_PROVIDER_SITE_OTHER): Payer: Medicare Other | Admitting: Internal Medicine

## 2021-06-27 VITALS — BP 120/84 | HR 56 | Ht 74.0 in | Wt 216.2 lb

## 2021-06-27 DIAGNOSIS — I480 Paroxysmal atrial fibrillation: Secondary | ICD-10-CM

## 2021-06-27 DIAGNOSIS — Z95810 Presence of automatic (implantable) cardiac defibrillator: Secondary | ICD-10-CM | POA: Diagnosis not present

## 2021-06-27 DIAGNOSIS — I420 Dilated cardiomyopathy: Secondary | ICD-10-CM | POA: Diagnosis not present

## 2021-06-27 DIAGNOSIS — Z79899 Other long term (current) drug therapy: Secondary | ICD-10-CM | POA: Diagnosis not present

## 2021-06-27 DIAGNOSIS — I4729 Other ventricular tachycardia: Secondary | ICD-10-CM

## 2021-06-27 NOTE — Progress Notes (Signed)
? ? ? ? ?Patient Care Team: ?London Pepper, MD as PCP - General (Family Medicine) ?Thompson Grayer, MD as PCP - Electrophysiology (Cardiology) ? ? ?HPI ? ?Robert Ochoa is a 76 y.o. 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  Rx with amio for VTwhich was assoc 2013 with afib and also anticoagulation w  Apixaban . ? ?No interval palpitations of which he is aware.  Tolerating amiodarone without cough.  Some photosensitivity. ? ?No palpitations.  Good exercise tolerance.  Does not feel limited.  But also has scaled way back over the last months for variety of other reasons. ? ?Date Cr K Hgb TSH LFTs  ?12/22 1.07 4.8 15.9 1.09 (10/22) 19 (10/22)  ?        ?  ? ?       ?Date Rhythm Treatment  ?2/13 VT Amio  ?2/13 Afib Amio  ? ?DATE TEST EF   ?5/13 Echo   50-55% % RVE and hypokinesis  ?11//10 cMRI 47% RV dilatation and ?hypokinesis  ?7/16 Echo  50-55%   ?11/22 Echo  55% RV function normal  ?  ? ? ?Records and Results Reviewed old records dating back to 2014  ? ?Past Medical History:  ?Diagnosis Date  ? 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 Hx of VT s/p dual-chamber St. Jude ICD. Cardiac MRI 2010 showed moderately dilated RV with hypokinesis, LV EF 47%. EF 50-55% 2010 but down to 45% 05/2011. Cath 01/2009 without significant CAD.  ? Atrial fibrillation or flutter   ? Paroxysmal. Started on Pradaxa 05/2011.  ? Cardiac defibrillator in place   ? LV dysfunction   ? per echo 10/2011; EF 35%  // Echo 2/18: EF 40-45, diff HK, Gr 1 DD, mild dilated aortic root, mod LAE, mod RVE, mild reduced RVSF, mod RAE  ? Prostate cancer ([place])   ? Septic shock(785.52) 10-27-11 cause unknown  ? with associated respiratory/renal failure/PAF/bradycardia  ? Ventricular tachycardia   ? ICD discharges 05/2011 for this & AF-RVR - started on amiodarone then.  ? Wears glasses   ? ? ?Past Surgical History:  ?Procedure  Laterality Date  ? APPENDECTOMY  2002  ? CARDIAC DEFIBRILLATOR PLACEMENT  02/19/09  ?  St,. Jude   ? ICD GENERATOR CHANGEOUT N/A 03/27/2021  ? Procedure: ICD GENERATOR CHANGEOUT;  Surgeon: Thompson Grayer, MD;  Location: [facility] CV LAB;  Service: Cardiovascular;  Laterality: N/A;  ? IMPLANTABLE CARDIOVERTER DEFIBRILLATOR (ICD) GENERATOR CHANGE N/A 03/01/2013  ? Procedure: ICD GENERATOR CHANGE;  Surgeon: [name], MD;  Location[facility] C[facility] LAB;  Service: Cardiovascular;  Laterality: N/A;  ? IMPLANTABLE CARDIOVERTER DEFIBRILLATOR GENERATOR CHANGE  03/01/13  ? for premature battery depletetion.  Now has a SJM Ellipse DR device placed by Dr Caryl Comes  ? PROSTATE BIOPSY  11/2019  ? RADIOACTIVE SEED IMPLANT N/A 04/30/2020  ? Procedure: RADIOACTIVE SEED IMPLANT/BRACHYTHERAPY IMPLANT;  Surgeon: Ceasar Mons, MD;  Location: [facility];  Service: Urology;  Laterality: N/A;  ? SPACE OAR INSTILLATION N/A 04/30/2020  ? Procedure: SPACE OAR INSTILLATION;  Surgeon: Ceasar Mons, MD;  Location: [facility];  Service: Urology;  Laterality: N/A;  ? ? ?Current Meds  ?Medication Sig  ? amiodarone (PACERONE) 200 MG tablet TAKE 1/2 TABLET(100 MG) BY MOUTH DAILY  ? apixaban (ELIQUIS) 5 MG TABS tablet TAKE 1 TABLET BY MOUTH TWICE DAILY  ?  Calcium-Magnesium-Zinc (CAL-MAG-ZINC PO) Take 2 tablets by mouth daily.  ? lisinopril (ZESTRIL) 10 MG tablet TAKE 2 TABLETS BY MOUTH EVERY DAY  ? metoprolol tartrate (LOPRESSOR) 25 MG tablet TAKE 3 TABLETS(75 MG) BY MOUTH TWICE DAILY  ? Omega-3 Fatty Acids (OMEGA 3 PO) Take 4,800 mg by mouth daily.  ? sildenafil (REVATIO) 20 MG tablet Take 20-100 mg by mouth daily as needed (ED).  ? tamsulosin (FLOMAX) 0.4 MG CAPS capsule Take 0.4 mg by mouth daily.  ? vitamin C (ASCORBIC ACID) 500 MG tablet Take 1,000 mg by mouth daily.  ? ? ?No Known Allergies ? ? ? ?Review of Systems negative except from HPI and PMH ? ?Physical Exam ?BP 120/84   Pulse (!) 56   Ht 6'  2" (1.88 m)   Wt 216 lb 3.2 oz (98.1 kg)   SpO2 95%   BMI 27.76 kg/m?  ?Well developed and well nourished in no acute distress ?HENT normal ?E scleral and icterus clear ?Neck Supple ?JVP flat; carotids brisk and full ?Clear to ausculation ?We will have a repeat regular rate and rhythm, no murmurs gallops or rub ?Soft with active bowel sounds ?No clubbing cyanosis  Edema ?Alert and oriented, grossly normal motor and sensory function ?Skin Warm and Dry ? ?ECG sinus at 56 ?Intervals 25/11/45 ? ?Device interrogation demonstrated only 1.5% of his beats faster than 90 bpm. ? ?CrCl cannot be calculated (Patient's most recent lab result is older than the maximum 21 days allowed.). ? ? ?Assessment and  Plan ? ?VT  ? ?Afib ? ?ARVC ? ?Sinus node dysfunction ? ?Amiodarone therapy ? ?Sinus bradycardia  ? ?Patient has had no intercurrent ventricular tachycardia.  We will continue him on amiodarone 100 mg a day.  Needs TSH as part of surveillance. ? ?Also no significant atrial fibrillation.  Continue him on apixaban 5 mg daily as he had no clinical bleeding. ? ?Sinus bradycardia is likely at least in part iatrogenic although I am not sure that it is symptomatic. ? ? his blood pressures are well controlled.  We will decrease his metoprolol from 75--50 twice daily.  We will recheck his blood pressures over the next month or so and if can we will further reduce his metoprolol from 50 twice daily--25 and then possibly a month or 2 after that try to wean him off entirely. ? ?He had a transient cardiomyopathy.  We will continue him on lisinopril 20 mg a day for now ? ? ? ? ? ?Current medicines are reviewed at length with the patient today .  The patient does not  have concerns regarding medicines. ? ?

## 2021-06-27 NOTE — Patient Instructions (Addendum)
Medication Instructions:  ?Your physician has recommended you make the following change in your medication:  ?STOP Metoprolol (Lopressor)  -- You will WEAN off this medication. ?Take 50 mg twice daily for ONE MONTH ?Keep track of your blood pressures during this time frame and call the office and let us know how they are doing near the end of this month ? ?*If you need a refill on your cardiac medications before your next appointment, please call your pharmacy* ? ? ?Lab Work: ?Today: TSH  ?If you have labs (blood work) drawn today and your tests are completely normal, you will receive your results only by: ?MyChart Message (if you have MyChart) OR ?A paper copy in the mail ?If you have any lab test that is abnormal or we need to change your treatment, we will call you to review the results. ? ? ?Testing/Procedures: ?None ordered ? ? ?Follow-Up: ?At [facility], you and your health needs are our priority.  As part of our continuing mission to provide you with exceptional heart care, we have created designated Provider Care Teams.  These Care Teams include your primary Cardiologist (physician) and Advanced Practice Providers (APPs -  Physician Assistants and Nurse Practitioners) who all work together to provide you with the care you need, when you need it. ? ?Remote monitoring is used to monitor your Pacemaker or ICD from home. This monitoring reduces the number of office visits required to check your device to one time per year. It allows [place] to keep an eye on the functioning of your device to ensure it is working properly. You are scheduled for a device check from home on 07/09/2021. You may send your transmission at any time that day. If you have a wireless device, the transmission will be sent automatically. After your physician reviews your transmission, you will receive a postcard with your next transmission date. ? ?Your next appointment:   ?6 month(s) ? ?The format for your next appointment:   ?In  Person ? ?Provider:   ?Virl Axe, MD ? ? ?Thank you for choosing [facility]!! ? ? ?[phone] ? ? ?

## 2021-06-28 LAB — TSH: TSH: 1.14 u[IU]/mL (ref 0.450–4.500)

## 2021-07-01 DIAGNOSIS — F4322 Adjustment disorder with anxiety: Secondary | ICD-10-CM | POA: Diagnosis not present

## 2021-07-08 DIAGNOSIS — F4322 Adjustment disorder with anxiety: Secondary | ICD-10-CM | POA: Diagnosis not present

## 2021-07-09 ENCOUNTER — Ambulatory Visit (INDEPENDENT_AMBULATORY_CARE_PROVIDER_SITE_OTHER): Payer: Medicare Other

## 2021-07-09 DIAGNOSIS — I4729 Other ventricular tachycardia: Secondary | ICD-10-CM | POA: Diagnosis not present

## 2021-07-09 LAB — CUP PACEART REMOTE DEVICE CHECK
Battery Remaining Longevity: 88 mo
Battery Remaining Percentage: 93 %
Battery Voltage: 3.16 V
Brady Statistic AP VP Percent: 1 %
Brady Statistic AP VS Percent: 40 %
Brady Statistic AS VP Percent: 1 %
Brady Statistic AS VS Percent: 58 %
Brady Statistic RA Percent Paced: 36 %
Brady Statistic RV Percent Paced: 1.4 %
Date Time Interrogation Session: 20230412020026
HighPow Impedance: 53 Ohm
HighPow Impedance: 53 Ohm
Implantable Lead Implant Date: 20101123
Implantable Lead Implant Date: 20101123
Implantable Lead Location: 753859
Implantable Lead Location: 753860
Implantable Lead Model: 7121
Implantable Pulse Generator Implant Date: 20201229
Lead Channel Impedance Value: 440 Ohm
Lead Channel Impedance Value: 460 Ohm
Lead Channel Pacing Threshold Amplitude: 0.75 V
Lead Channel Pacing Threshold Amplitude: 1 V
Lead Channel Pacing Threshold Pulse Width: 0.5 ms
Lead Channel Pacing Threshold Pulse Width: 0.6 ms
Lead Channel Sensing Intrinsic Amplitude: 3.3 mV
Lead Channel Sensing Intrinsic Amplitude: 9.1 mV
Lead Channel Setting Pacing Amplitude: 2 V
Lead Channel Setting Pacing Amplitude: 2.5 V
Lead Channel Setting Pacing Pulse Width: 0.6 ms
Lead Channel Setting Sensing Sensitivity: 0.5 mV
Pulse Gen Serial Number: 8907499

## 2021-07-22 DIAGNOSIS — F4322 Adjustment disorder with anxiety: Secondary | ICD-10-CM | POA: Diagnosis not present

## 2021-07-25 NOTE — Progress Notes (Signed)
Remote pacemaker transmission.   

## 2021-07-28 ENCOUNTER — Telehealth: Payer: Self-pay

## 2021-07-28 NOTE — Telephone Encounter (Signed)
Please have patient send remote transmission 08/04/21 to review presenting rhythm. Send to triage RN.  ? ?Abbott alert for persistent AT/AF ?Presenting rhythm AF/VP ?Burden 9.1%, previous 7.1% Eliquis ?Route for ongoing AF ?

## 2021-07-29 DIAGNOSIS — F4322 Adjustment disorder with anxiety: Secondary | ICD-10-CM | POA: Diagnosis not present

## 2021-07-31 ENCOUNTER — Encounter: Payer: Self-pay | Admitting: Internal Medicine

## 2021-08-02 ENCOUNTER — Encounter: Payer: Self-pay | Admitting: Internal Medicine

## 2021-08-05 ENCOUNTER — Ambulatory Visit: Payer: Medicare Other | Admitting: Podiatry

## 2021-08-07 ENCOUNTER — Encounter: Payer: Self-pay | Admitting: Podiatry

## 2021-08-07 ENCOUNTER — Ambulatory Visit (INDEPENDENT_AMBULATORY_CARE_PROVIDER_SITE_OTHER): Payer: Medicare Other | Admitting: Podiatry

## 2021-08-07 DIAGNOSIS — D2371 Other benign neoplasm of skin of right lower limb, including hip: Secondary | ICD-10-CM | POA: Diagnosis not present

## 2021-08-07 DIAGNOSIS — M7751 Other enthesopathy of right foot: Secondary | ICD-10-CM

## 2021-08-07 MED ORDER — DEXAMETHASONE SODIUM PHOSPHATE 120 MG/30ML IJ SOLN
2.0000 mg | Freq: Once | INTRAMUSCULAR | Status: AC
  Administered 2021-08-07: 2 mg via INTRA_ARTICULAR

## 2021-08-10 NOTE — Progress Notes (Signed)
He presents today for follow-up of his bursitis of fifth metatarsal phalangeal joint of his right foot.  He states that the last time it just did not seem to work as a good did previously and it was back quite quickly. ? ?Objective: Vital signs are stable he is alert oriented x3 still has bursitis beneath the fifth metatarsal head of the right foot on palpation.  There is also a reactive hyperkeratotic lesion that is present there. ? ?Assessment: Bursitis and a deep porokeratotic lesion subfifth metatarsal head of the right foot. ? ?Plan: At this point we went ahead and injected the area with a mix of dexamethasone and Kenalog.  Only about 5 mg of Kenalog was injected to the area and probably less than 1 mg of dexamethasone.  There was some local anesthetic administered as well.  I did debride the porokeratotic lesion.  I placed salicylic acid under occlusion to be left on for about 3 days and washed off thoroughly.  He will follow-up with me in about 6 weeks or 6 months if possible. ?

## 2021-08-12 DIAGNOSIS — F4322 Adjustment disorder with anxiety: Secondary | ICD-10-CM | POA: Diagnosis not present

## 2021-08-20 NOTE — Telephone Encounter (Signed)
Dr Caryl Comes has spoken with pt who is agreeable to decrease Metoprolol from '50mg'$  to '25mg'$  and continue monitoring BP and HR.

## 2021-08-20 NOTE — Telephone Encounter (Signed)
Will reduce from 50 twice a day to 25 twice a day

## 2021-08-23 ENCOUNTER — Other Ambulatory Visit: Payer: Self-pay | Admitting: Internal Medicine

## 2021-08-26 DIAGNOSIS — F4322 Adjustment disorder with anxiety: Secondary | ICD-10-CM | POA: Diagnosis not present

## 2021-09-02 DIAGNOSIS — H6120 Impacted cerumen, unspecified ear: Secondary | ICD-10-CM | POA: Diagnosis not present

## 2021-09-02 DIAGNOSIS — H9201 Otalgia, right ear: Secondary | ICD-10-CM | POA: Diagnosis not present

## 2021-09-02 DIAGNOSIS — R6884 Jaw pain: Secondary | ICD-10-CM | POA: Diagnosis not present

## 2021-09-09 DIAGNOSIS — F4322 Adjustment disorder with anxiety: Secondary | ICD-10-CM | POA: Diagnosis not present

## 2021-09-09 DIAGNOSIS — H6123 Impacted cerumen, bilateral: Secondary | ICD-10-CM | POA: Diagnosis not present

## 2021-09-09 DIAGNOSIS — H9201 Otalgia, right ear: Secondary | ICD-10-CM | POA: Diagnosis not present

## 2021-09-23 DIAGNOSIS — F4322 Adjustment disorder with anxiety: Secondary | ICD-10-CM | POA: Diagnosis not present

## 2021-09-25 ENCOUNTER — Encounter: Payer: Self-pay | Admitting: Internal Medicine

## 2021-10-02 DIAGNOSIS — C61 Malignant neoplasm of prostate: Secondary | ICD-10-CM | POA: Diagnosis not present

## 2021-10-04 NOTE — Telephone Encounter (Signed)
Please Inform Patient BP look pretty good  Any dizziness with the lower numbers? Thanks

## 2021-10-06 DIAGNOSIS — Z23 Encounter for immunization: Secondary | ICD-10-CM | POA: Diagnosis not present

## 2021-10-06 DIAGNOSIS — I429 Cardiomyopathy, unspecified: Secondary | ICD-10-CM | POA: Diagnosis not present

## 2021-10-06 DIAGNOSIS — Z0000 Encounter for general adult medical examination without abnormal findings: Secondary | ICD-10-CM | POA: Diagnosis not present

## 2021-10-06 DIAGNOSIS — I4891 Unspecified atrial fibrillation: Secondary | ICD-10-CM | POA: Diagnosis not present

## 2021-10-06 DIAGNOSIS — I700 Atherosclerosis of aorta: Secondary | ICD-10-CM | POA: Diagnosis not present

## 2021-10-06 DIAGNOSIS — E785 Hyperlipidemia, unspecified: Secondary | ICD-10-CM | POA: Diagnosis not present

## 2021-10-06 DIAGNOSIS — D6869 Other thrombophilia: Secondary | ICD-10-CM | POA: Diagnosis not present

## 2021-10-07 DIAGNOSIS — F4322 Adjustment disorder with anxiety: Secondary | ICD-10-CM | POA: Diagnosis not present

## 2021-10-08 ENCOUNTER — Ambulatory Visit (INDEPENDENT_AMBULATORY_CARE_PROVIDER_SITE_OTHER): Payer: Medicare Other

## 2021-10-08 DIAGNOSIS — D1801 Hemangioma of skin and subcutaneous tissue: Secondary | ICD-10-CM | POA: Diagnosis not present

## 2021-10-08 DIAGNOSIS — Z85828 Personal history of other malignant neoplasm of skin: Secondary | ICD-10-CM | POA: Diagnosis not present

## 2021-10-08 DIAGNOSIS — L821 Other seborrheic keratosis: Secondary | ICD-10-CM | POA: Diagnosis not present

## 2021-10-08 DIAGNOSIS — I428 Other cardiomyopathies: Secondary | ICD-10-CM | POA: Diagnosis not present

## 2021-10-08 DIAGNOSIS — L812 Freckles: Secondary | ICD-10-CM | POA: Diagnosis not present

## 2021-10-08 DIAGNOSIS — L570 Actinic keratosis: Secondary | ICD-10-CM | POA: Diagnosis not present

## 2021-10-08 DIAGNOSIS — L720 Epidermal cyst: Secondary | ICD-10-CM | POA: Diagnosis not present

## 2021-10-08 LAB — CUP PACEART REMOTE DEVICE CHECK
Battery Remaining Longevity: 85 mo
Battery Remaining Percentage: 91 %
Battery Voltage: 3.1 V
Brady Statistic AP VP Percent: 1 %
Brady Statistic AP VS Percent: 36 %
Brady Statistic AS VP Percent: 1 %
Brady Statistic AS VS Percent: 62 %
Brady Statistic RA Percent Paced: 29 %
Brady Statistic RV Percent Paced: 5.9 %
Date Time Interrogation Session: 20230712042743
HighPow Impedance: 51 Ohm
HighPow Impedance: 51 Ohm
Implantable Lead Implant Date: 20101123
Implantable Lead Implant Date: 20101123
Implantable Lead Location: 753859
Implantable Lead Location: 753860
Implantable Lead Model: 7121
Implantable Pulse Generator Implant Date: 20201229
Lead Channel Impedance Value: 390 Ohm
Lead Channel Impedance Value: 450 Ohm
Lead Channel Pacing Threshold Amplitude: 0.75 V
Lead Channel Pacing Threshold Amplitude: 1 V
Lead Channel Pacing Threshold Pulse Width: 0.5 ms
Lead Channel Pacing Threshold Pulse Width: 0.6 ms
Lead Channel Sensing Intrinsic Amplitude: 10.8 mV
Lead Channel Sensing Intrinsic Amplitude: 4.3 mV
Lead Channel Setting Pacing Amplitude: 2 V
Lead Channel Setting Pacing Amplitude: 2.5 V
Lead Channel Setting Pacing Pulse Width: 0.6 ms
Lead Channel Setting Sensing Sensitivity: 0.5 mV
Pulse Gen Serial Number: 8907499

## 2021-10-11 ENCOUNTER — Other Ambulatory Visit: Payer: Self-pay | Admitting: Internal Medicine

## 2021-10-21 DIAGNOSIS — F4322 Adjustment disorder with anxiety: Secondary | ICD-10-CM | POA: Diagnosis not present

## 2021-10-28 NOTE — Progress Notes (Signed)
Remote pacemaker transmission.   

## 2021-11-04 DIAGNOSIS — F4322 Adjustment disorder with anxiety: Secondary | ICD-10-CM | POA: Diagnosis not present

## 2021-11-18 DIAGNOSIS — F4322 Adjustment disorder with anxiety: Secondary | ICD-10-CM | POA: Diagnosis not present

## 2021-11-18 NOTE — Progress Notes (Signed)
Remote reviewed. Battery status noted.  Leads function stable

## 2021-11-25 ENCOUNTER — Ambulatory Visit (INDEPENDENT_AMBULATORY_CARE_PROVIDER_SITE_OTHER): Payer: Medicare Other | Admitting: Podiatry

## 2021-11-25 ENCOUNTER — Encounter: Payer: Self-pay | Admitting: Podiatry

## 2021-11-25 DIAGNOSIS — M7751 Other enthesopathy of right foot: Secondary | ICD-10-CM

## 2021-11-25 DIAGNOSIS — D2371 Other benign neoplasm of skin of right lower limb, including hip: Secondary | ICD-10-CM

## 2021-11-25 MED ORDER — DEXAMETHASONE SODIUM PHOSPHATE 120 MG/30ML IJ SOLN
2.0000 mg | Freq: Once | INTRAMUSCULAR | Status: AC
  Administered 2021-11-25: 2 mg via INTRA_ARTICULAR

## 2021-11-25 NOTE — Progress Notes (Signed)
He presents today for callus trim he states that it did not last very long this time because I got it wet immediately after you did the Salinocaine pack.  She refers to the area beneath the fifth metatarsal of the right foot.  Objective: Vital signs stable alert oriented x3.  Pulses are palpable.  There is no erythema there is some fluctuance beneath the fifth metatarsal lesion which is most likely bursitis.  There is some still some benign skin lesion that is present.  Assessment: Benign skin lesion bursitis Sub fifth met right.  Plan: I injected the area today with dexamethasone local anesthetic debrided the benign skin lesion placed padding Salinocaine was placed under occlusion to be washed off thoroughly in 3 days and try not to get it wet.  I will follow-up with him in about 6 months if necessary we will see him sooner.

## 2021-12-02 DIAGNOSIS — F4322 Adjustment disorder with anxiety: Secondary | ICD-10-CM | POA: Diagnosis not present

## 2021-12-16 DIAGNOSIS — F4322 Adjustment disorder with anxiety: Secondary | ICD-10-CM | POA: Diagnosis not present

## 2021-12-25 DIAGNOSIS — I495 Sick sinus syndrome: Secondary | ICD-10-CM | POA: Insufficient documentation

## 2021-12-25 DIAGNOSIS — R001 Bradycardia, unspecified: Secondary | ICD-10-CM | POA: Insufficient documentation

## 2021-12-26 ENCOUNTER — Ambulatory Visit: Payer: Medicare Other | Attending: Internal Medicine | Admitting: Internal Medicine

## 2021-12-26 ENCOUNTER — Encounter: Payer: Self-pay | Admitting: Internal Medicine

## 2021-12-26 VITALS — BP 122/86 | HR 77 | Ht 74.0 in | Wt 215.6 lb

## 2021-12-26 DIAGNOSIS — I4891 Unspecified atrial fibrillation: Secondary | ICD-10-CM | POA: Insufficient documentation

## 2021-12-26 DIAGNOSIS — I495 Sick sinus syndrome: Secondary | ICD-10-CM | POA: Diagnosis not present

## 2021-12-26 DIAGNOSIS — I4729 Other ventricular tachycardia: Secondary | ICD-10-CM | POA: Diagnosis not present

## 2021-12-26 DIAGNOSIS — R001 Bradycardia, unspecified: Secondary | ICD-10-CM | POA: Insufficient documentation

## 2021-12-26 DIAGNOSIS — Z79899 Other long term (current) drug therapy: Secondary | ICD-10-CM | POA: Insufficient documentation

## 2021-12-26 MED ORDER — FUROSEMIDE 20 MG PO TABS: ORAL_TABLET | ORAL | 0 refills | Status: DC

## 2021-12-26 MED ORDER — AMIODARONE HCL 200 MG PO TABS: ORAL_TABLET | ORAL | 0 refills | Status: DC

## 2021-12-26 MED ORDER — AMIODARONE HCL 200 MG PO TABS: 200.0000 mg | ORAL_TABLET | Freq: Every day | ORAL | 3 refills | Status: DC

## 2021-12-26 NOTE — Progress Notes (Signed)
Patient Care Team: London Pepper, MD as PCP - General (Family Medicine) Thompson Grayer, MD as PCP - Electrophysiology (Cardiology)   HPI  Robert Ochoa is a 76 y.o. 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  Rx with amio for VTwhich was assoc 2013 with afib-paroxysmal  and also anticoagulation w  Apixaban .  No significant palpitations.  Tolerating amiodarone without cough or sun intolerance  Device tolerance and has noted no changes in it (see below)  Date Cr K Hgb TSH LFTs  12/22 1.07 4.8 15.9 1.09 (10/22) 19 (10/22)       1.14 (3/23)             Date Rhythm Treatment  2/13 VT Amio  2/13 Afib Amio   DATE TEST EF   5/13 Echo   50-55% % RVE and hypokinesis  11//10 cMRI 47% RV dilatation and hypokinesis  7/16 Echo  50-55%   11/22 Echo  55% RV function normal      Records and Results Reviewed old records dating back to 2014   Past Medical History:  Diagnosis Date   Arrhythmogenic right ventricular dysplas ([place])    Arrhythmogenic right ventricular dysplasia ([place])    Hx of VT s/p dual-chamber St. Jude ICD. Cardiac MRI 2010 showed moderately dilated RV with hypokinesis, LV EF 47%. EF 50-55% 2010 but down to 45% 05/2011. Cath 01/2009 without significant CAD.   Atrial fibrillation or flutter    Paroxysmal. Started on Pradaxa 05/2011.   Cardiac defibrillator in place    LV dysfunction    per echo 10/2011; EF 35%  // Echo 2/18: EF 40-45, diff HK, Gr 1 DD, mild dilated aortic root, mod LAE, mod RVE, mild reduced RVSF, mod RAE   Prostate cancer ([place])    Septic shock(785.52) 10-27-11 cause unknown   with associated respiratory/renal failure/PAF/bradycardia   Ventricular tachycardia ([place])    ICD discharges 05/2011 for this & AF-RVR - started on amiodarone then.   Wears glasses     Past Surgical History:  Procedure Laterality Date   APPENDECTOMY  2002   CARDIAC DEFIBRILLATOR PLACEMENT  02/19/09     St,. Jude    ICD GENERATOR CHANGEOUT N/A 03/27/2021   Procedure: ICD GENERATOR CHANGEOUT;  Surgeon: Thompson Grayer, MD;  Location: [facility];  Service: Cardiovascular;  Laterality: N/A;   IMPLANTABLE CARDIOVERTER DEFIBRILLATOR (ICD) GENERATOR CHANGE N/A 03/01/2013   Procedure: ICD GENERATOR CHANGE;  Surgeon: [name], MD;  Location: [facility] C[facility];  Service: Cardiovascular;  Laterality: N/A;   IMPLANTABLE CARDIOVERTER DEFIBRILLATOR GENERATOR CHANGE  03/01/13   for premature battery depletetion.  Now has a SJM Ellipse DR device placed by Dr Caryl Comes   PROSTATE BIOPSY  11/2019   [place] N/A 04/30/2020   Procedure: RADIOACTIVE SEED IMPLANT/BRACHYTHERAPY IMPLANT;  Surgeon: Ceasar Mons, MD;  Location: [facility];  Service: Urology;  Laterality: N/A;   SPACE OAR INSTILLATION N/A 04/30/2020   Procedure: SPACE OAR INSTILLATION;  Surgeon: Ceasar Mons, MD;  Location: [facility];  Service: Urology;  Laterality: N/A;    Current Meds  Medication Sig   amiodarone (PACERONE) 200 MG tablet TAKE 1/2 TABLET(100 MG) BY MOUTH DAILY   apixaban (ELIQUIS) 5 MG TABS tablet TAKE 1 TABLET BY MOUTH TWICE DAILY   Calcium-Magnesium-Zinc (CAL-MAG-ZINC PO) Take 2 tablets by mouth daily.   lisinopril (ZESTRIL) 10 MG tablet  TAKE 2 TABLETS BY MOUTH EVERY DAY   Omega-3 Fatty Acids (OMEGA 3 PO) Take 4,800 mg by mouth daily.   sildenafil (REVATIO) 20 MG tablet Take 20-100 mg by mouth daily as needed (ED).   tamsulosin (FLOMAX) 0.4 MG CAPS capsule Take 0.4 mg by mouth daily.   vitamin C (ASCORBIC ACID) 500 MG tablet Take 1,000 mg by mouth daily.    No Known Allergies    Review of Systems negative except from HPI and PMH  Physical Exam BP 122/86   Pulse 77   Ht '6\' 2"'$  (1.88 m)   Wt 215 lb 9.6 oz (97.8 kg)   SpO2 95%   BMI 27.68 kg/m  Well developed and well nourished in no acute distress HENT normal Neck supple with  JVP-flat Clear Device pocket well healed; without hematoma or erythema.  There is no tethering  Irregularly Irregular rate and rhythm with controlled  ventricular response  murmur Abd-soft with active BS No Clubbing cyanosis 2+L and 1+ R edema Skin-warm and dry A & Oriented  Grossly normal sensory and motor function  ECG atrial fibrillation at 77 Intervals-/11/39 Axis left -54 Low voltage  CrCl cannot be calculated (Patient's most recent lab result is older than the maximum 21 days allowed.).   Assessment and  Plan  VT   Afib  ARVC  Sinus node dysfunction  Amiodarone therapy  Sinus bradycardia   Low voltage with mild LVH \\\ Afib 25 % burden increasing over recent 6 month   No intercurrent ventricular tachycardia.  Continue the amiodarone but we will increase it in lieu of his atrial fibrillation burden.  400 twice daily x2 weeks, 200 twice daily x2 weeks then 200 mg a day.  We will check amiodarone surveillance laboratories. Will refer to Dr. Jannifer Rodney for consideration of catheter ablation not withstanding the paucity of symptoms.  With his edema, likely secondary to A-fib, we will put him on furosemide 20 mg daily x5 days and then he will use it as needed.  No clinical bleeding.  Continue Eliquis.  Check CBC  With his cardiomyopathy, we will continue beta-blocker as well as lisinopril.   Current medicines are reviewed at length with the patient today .  The patient does not  have concerns regarding medicines.

## 2021-12-26 NOTE — Patient Instructions (Addendum)
Medication Instructions:  ** Stop Amiodarone '100mg'$  and increase to '400mg'$  (2 tablets) by mouth twice daily x 2 weeks, then Amiodarone '200mg'$  - 1 tablet by mouth twice daily then Amiodarone '200mg'$  - 1 tablet by mouth daily. *If you need a refill on your cardiac medications before your next appointment, please call your pharmacy*  ** Begin Furosemide '20mg'$  - 1 tablet by mouth daily x 5 days then as needed for swelling.   Lab Work: CBC, CMETand TSH today If you have labs (blood work) drawn today and your tests are completely normal, you will receive your results only by: [place] (if you have MyChart) OR A paper copy in the mail If you have any lab test that is abnormal or we need to change your treatment, we will call you to review the results.   Testing/Procedures: None ordered.    Follow-Up: At [facility], you and your health needs are our priority.  As part of our continuing mission to provide you with exceptional heart care, we have created designated Provider Care Teams.  These Care Teams include your primary Cardiologist (physician) and Advanced Practice Providers (APPs -  Physician Assistants and Nurse Practitioners) who all work together to provide you with the care you need, when you need it.  We recommend signing up for the patient portal called "MyChart".  Sign up information is provided on this After Visit Summary.  MyChart is used to connect with patients for Virtual Visits (Telemedicine).  Patients are able to view lab/test results, encounter notes, upcoming appointments, etc.  Non-urgent messages can be sent to your provider as well.   To learn more about what you can do with MyChart, go to [email or web address].    Your next appointment:   01/12/2022 with Dr Myles Gip at 345pm  Important Information About Sugar

## 2021-12-27 LAB — COMPREHENSIVE METABOLIC PANEL
ALT: 25 IU/L (ref 0–44)
AST: 24 IU/L (ref 0–40)
Albumin/Globulin Ratio: 1.9 (ref 1.2–2.2)
Albumin: 4.4 g/dL (ref 3.8–4.8)
Alkaline Phosphatase: 74 IU/L (ref 44–121)
BUN/Creatinine Ratio: 18 (ref 10–24)
BUN: 21 mg/dL (ref 8–27)
Bilirubin Total: 0.8 mg/dL (ref 0.0–1.2)
CO2: 23 mmol/L (ref 20–29)
Calcium: 9.5 mg/dL (ref 8.6–10.2)
Chloride: 104 mmol/L (ref 96–106)
Creatinine, Ser: 1.14 mg/dL (ref 0.76–1.27)
Globulin, Total: 2.3 g/dL (ref 1.5–4.5)
Glucose: 105 mg/dL — ABNORMAL HIGH (ref 70–99)
Potassium: 4.9 mmol/L (ref 3.5–5.2)
Sodium: 139 mmol/L (ref 134–144)
Total Protein: 6.7 g/dL (ref 6.0–8.5)
eGFR: 66 mL/min/{1.73_m2} (ref >59)

## 2021-12-27 LAB — CBC
Hematocrit: 50.3 % (ref 37.5–51.0)
Hemoglobin: 16.4 g/dL (ref 13.0–17.7)
MCH: 28.8 pg (ref 26.6–33.0)
MCHC: 32.6 g/dL (ref 31.5–35.7)
MCV: 88 fL (ref 79–97)
Platelets: 191 10*3/uL (ref 150–450)
RBC: 5.69 x10E6/uL (ref 4.14–5.80)
RDW: 12.5 % (ref 11.6–15.4)
WBC: 4.3 10*3/uL (ref 3.4–10.8)

## 2021-12-27 LAB — TSH: TSH: 1.02 u[IU]/mL (ref 0.450–4.500)

## 2021-12-30 DIAGNOSIS — F4322 Adjustment disorder with anxiety: Secondary | ICD-10-CM | POA: Diagnosis not present

## 2022-01-07 ENCOUNTER — Ambulatory Visit (INDEPENDENT_AMBULATORY_CARE_PROVIDER_SITE_OTHER): Payer: Medicare Other

## 2022-01-07 DIAGNOSIS — I428 Other cardiomyopathies: Secondary | ICD-10-CM

## 2022-01-07 LAB — CUP PACEART REMOTE DEVICE CHECK
Battery Remaining Longevity: 81 mo
Battery Remaining Percentage: 88 %
Battery Voltage: 3.07 V
Brady Statistic AP VP Percent: 2.6 %
Brady Statistic AP VS Percent: 34 %
Brady Statistic AS VP Percent: 3 %
Brady Statistic AS VS Percent: 60 %
Brady Statistic RA Percent Paced: 16 %
Brady Statistic RV Percent Paced: 22 %
Date Time Interrogation Session: 20231011020019
HighPow Impedance: 55 Ohm
HighPow Impedance: 55 Ohm
Implantable Lead Implant Date: 20101123
Implantable Lead Implant Date: 20101123
Implantable Lead Location: 753859
Implantable Lead Location: 753860
Implantable Lead Model: 7121
Implantable Pulse Generator Implant Date: 20201229
Lead Channel Impedance Value: 400 Ohm
Lead Channel Impedance Value: 450 Ohm
Lead Channel Pacing Threshold Amplitude: 0.75 V
Lead Channel Pacing Threshold Amplitude: 1 V
Lead Channel Pacing Threshold Pulse Width: 0.5 ms
Lead Channel Pacing Threshold Pulse Width: 0.6 ms
Lead Channel Sensing Intrinsic Amplitude: 3 mV
Lead Channel Sensing Intrinsic Amplitude: 9.5 mV
Lead Channel Setting Pacing Amplitude: 2 V
Lead Channel Setting Pacing Amplitude: 2.5 V
Lead Channel Setting Pacing Pulse Width: 0.6 ms
Lead Channel Setting Sensing Sensitivity: 0.5 mV
Pulse Gen Serial Number: 8907499

## 2022-01-09 ENCOUNTER — Other Ambulatory Visit: Payer: Self-pay | Admitting: Student

## 2022-01-09 DIAGNOSIS — I480 Paroxysmal atrial fibrillation: Secondary | ICD-10-CM

## 2022-01-09 NOTE — Telephone Encounter (Signed)
Prescription refill request for Eliquis received. Indication:Afib Last office visit:9/23 Scr:1.1 Age: 76 Weight:97.8 kg  Prescription refilled

## 2022-01-12 ENCOUNTER — Ambulatory Visit: Payer: Medicare Other | Attending: Cardiovascular Disease | Admitting: Cardiovascular Disease

## 2022-01-12 ENCOUNTER — Encounter: Payer: Self-pay | Admitting: Cardiovascular Disease

## 2022-01-12 VITALS — BP 112/84 | HR 75 | Ht 74.0 in | Wt 213.2 lb

## 2022-01-12 DIAGNOSIS — I4891 Unspecified atrial fibrillation: Secondary | ICD-10-CM | POA: Insufficient documentation

## 2022-01-12 DIAGNOSIS — I4729 Other ventricular tachycardia: Secondary | ICD-10-CM | POA: Insufficient documentation

## 2022-01-12 DIAGNOSIS — I420 Dilated cardiomyopathy: Secondary | ICD-10-CM | POA: Insufficient documentation

## 2022-01-12 NOTE — Progress Notes (Signed)
Cardiology Office Note:    Date:  01/12/2022   ID:  Robert Ochoa, DOB 03-11-1946, MRN [id number]  PCP:  London Pepper, [place] Providers Cardiologist:  None Electrophysiologist:  Melida Quitter, MD     Referring MD: London Pepper, MD   History of Present Illness:    Robert Ochoa is a 76 y.o. male with a hx of arrhythmogenic right ventricular cardiomyopathy, paroxysmal atrial fibrillation, ventricular tachycardia (see ECG 02/14/2009 with right sided VT), St. Jude biventricular ICD, sinus node dysfunction, referred by Dr. Caryl Comes to consider ablation.   He has a history of ARVC based on ECG and MRI criteria. He has had VT and now has an Abbott ICD for secondary prevention. The device generator was replaced in December, 2022. Amio was prescribed for VT, but he also has AF. At his last device visit with Dr. Caryl Comes, his AF burden was noted to have increased, so the amiodarone was also increased.  ECG from 03/27/2021 shows atrial flutter.  Past paceart reports show minimal atrial arrhythmia - usually about a 5% burden of short episodes. In the past few months, however, his burden has been increasing and is now about 32%   Past Medical History:  Diagnosis Date   Arrhythmogenic right ventricular dysplas [facility])    Arrhythmogenic right ventricular dysplasia ([facility])    Hx of VT s/p dual-chamber St. Jude ICD. Cardiac MRI 2010 showed moderately dilated RV with hypokinesis, LV EF 47%. EF 50-55% 2010 but down to 45% 05/2011. Cath 01/2009 without significant CAD.   Atrial fibrillation or flutter    Paroxysmal. Started on Pradaxa 05/2011.   Cardiac defibrillator in place    LV dysfunction    per echo 10/2011; EF 35%  // Echo 2/18: EF 40-45, diff HK, Gr 1 DD, mild dilated aortic root, mod LAE, mod RVE, mild reduced RVSF, mod RAE   Prostate cancer ([place])    Septic shock(785.52) 10-27-11 cause unknown   with associated respiratory/renal failure/PAF/bradycardia   Ventricular  tachycardia ([place])    ICD discharges 05/2011 for this & AF-RVR - started on amiodarone then.   Wears glasses     Past Surgical History:  Procedure Laterality Date   APPENDECTOMY  2002   CARDIAC DEFIBRILLATOR PLACEMENT  02/19/09    St,. Jude    ICD GENERATOR CHANGEOUT N/A 03/27/2021   Procedure: ICD GENERATOR CHANGEOUT;  Surgeon: Thompson Grayer, MD;  Location: [facility];  Service: Cardiovascular;  Laterality: N/A;   IMPLANTABLE CARDIOVERTER DEFIBRILLATOR (ICD) GENERATOR CHANGE N/A 03/01/2013   Procedure: ICD GENERATOR CHANGE;  Surgeon: [name], MD;  Location: [facility] CATH LAB;  Service: Cardiovascular;  Laterality: N/A;   IMPLANTABLE CARDIOVERTER DEFIBRILLATOR GENERATOR CHANGE  03/01/13   for premature battery depletetion.  Now has a SJM Ellipse DR device placed by Dr Caryl Comes   PROSTATE BIOPSY  11/2019   [place] N/A 04/30/2020   Procedure: RADIOACTIVE SEED IMPLANT/BRACHYTHERAPY IMPLANT;  Surgeon: Ceasar Mons, MD;  Location: [facility];  Service: Urology;  Laterality: N/A;   SPACE OAR INSTILLATION N/A 04/30/2020   Procedure: SPACE OAR INSTILLATION;  Surgeon: Ceasar Mons, MD;  Location: [facility];  Service: Urology;  Laterality: N/A;    Current Medications: No outpatient medications have been marked as taking for the 01/12/22 encounter (Appointment) with Kendrea Cerritos, Yetta Barre, MD.     Allergies:   Patient has no known allergies.   Social History   Socioeconomic History   Marital  status: Single    Spouse name: Not on file   Number of children: 1   Years of education: Not on file   Highest education level: Not on file  Occupational History    Employer: [employer]    Comment: Psychologist, sport and exercise  Tobacco Use   Smoking status: Never   Smokeless tobacco: Never  Vaping Use   Vaping Use: Never used  Substance and Sexual Activity   Alcohol use: Yes    Alcohol/week: 1.0 standard drink of alcohol     Types: 1 Cans of beer per week    Comment: monthly   Drug use: No   Sexual activity: Yes    Birth control/protection: Coitus interruptus  Other Topics Concern   Not on file  Social History Narrative   Divorced w 1 child. Self employed.    Social Determinants of Health   Financial Resource Strain: Not on file  Food Insecurity: Not on file  Transportation Needs: Not on file  Physical Activity: Not on file  Stress: Not on file  Social Connections: Not on file     Family History: The patient's family history includes Allergies in his mother; Cancer in his paternal aunt; Heart attack in his father; Prostate cancer in his cousin. There is no history of Colon cancer, Pancreatic cancer, or Breast cancer.  ROS:   Please see the history of present illness.    All other systems reviewed and are negative.  EKGs/Labs/Other Studies Reviewed:    I personally reviewed all prior ECGs available in [facility]. Notably: 03/27/21 - shows atrial flutter 02/14/2009 - VT with a RV origin  TTE 02/14/2021  1. Left ventricular ejection fraction, by estimation, is 55%. The left  ventricle has normal function. The left ventricle has no regional wall  motion abnormalities. There is mild concentric left ventricular  hypertrophy. Left ventricular diastolic  parameters are consistent with Grade I diastolic dysfunction (impaired  relaxation).   2. Right ventricular systolic function is normal. The right ventricular  size is normal. There is normal pulmonary artery systolic pressure.   3. The mitral valve is normal in structure. No evidence of mitral valve  regurgitation. No evidence of mitral stenosis.   4. The aortic valve is tricuspid. Aortic valve regurgitation is not  visualized. Aortic valve sclerosis is present, with no evidence of aortic  valve stenosis.   CMR 02/18/2009 1)    No evidence of fatty infiltration of the RV.  However RV is        clearly abnormal with moderate dilatation and hypokinesis        2)    Mildly reduced LV function due to abnormal septal motion and  PVC's during the exam.  EF 47%  3)    No hyperenhancement of the LV  4)    Mild RA enlargement.      EKG:  Last EKG results: today -- sinus rhythm with conversion to atrial flutter. Left axis deviation.   Recent Labs: 12/26/2021: ALT 25; BUN 21; Creatinine, Ser 1.14; Hemoglobin 16.4; Platelets 191; Potassium 4.9; Sodium 139; TSH 1.020      Physical Exam:    VS:  There were no vitals taken for this visit.    Wt Readings from Last 3 Encounters:  12/26/21 215 lb 9.6 oz (97.8 kg)  06/27/21 216 lb 3.2 oz (98.1 kg)  03/27/21 206 lb (93.4 kg)     GEN: Well nourished, well developed in no acute distress CARDIAC: RRR, no murmurs, rubs, gallops  RESPIRATORY:  Normal work of breathing MUSCULOSKELETAL: no edema    ASSESSMENT & PLAN:    Paroxysmal atrial fibrillation and flutter: with increasing burden, history of cardiomyopathy, I think rhythm control is appropriate. The patient and I had a prolonged discussion. His burden is increasing despite having increased amiodarone, and he would like to minimize his dose of amiodarone. He does have some fatigue and shortness of breath but no significant palpitations. We discussed the indication, rationale, logistics, anticipated benefits, and potential risks of the ablation procedure including but not limited to -- bleed at the groin access site, chest pain, damage to nearby organs such as the diaphragm, hungs, or esophagus, need for a drainage tube, or prolonged hospitalization. I explained that the risk for stroke, heart attack, need for open chest surgery, or even death is very low but not zero. He expressed understanding and wishes to proceed.  ARVC: resume previous dose of amiodarone VT: on amiodarone Amiodarone therapy Sinus bradycardia           Medication Adjustments/Labs and Tests Ordered: Current medicines are reviewed at length with the patient today.   Concerns regarding medicines are outlined above.  No orders of the defined types were placed in this encounter.  No orders of the defined types were placed in this encounter.    Signed, Melida Quitter, MD  01/12/2022 1:51 PM    [place]

## 2022-01-12 NOTE — Patient Instructions (Signed)
Medication Instructions:  Your physician recommends that you continue on your current medications as directed. Please refer to the Current Medication list given to you today.  *If you need a refill on your cardiac medications before your next appointment, please call your pharmacy*  Lab Work: CBC and BMET If you have labs (blood work) drawn today and your tests are completely normal, you will receive your results only by: [place] (if you have MyChart) OR A paper copy in the mail If you have any lab test that is abnormal or we need to change your treatment, we will call you to review the results.  Testing/Procedures: Your physician has requested that you have cardiac CT. Cardiac computed tomography (CT) is a painless test that uses an x-ray machine to take clear, detailed pictures of your heart. For further information please visit [email or web address].[email or web address]. Please follow instruction sheet as given.  Your physician has recommended that you have an ablation. Catheter ablation is a medical procedure used to treat some cardiac arrhythmias (irregular heartbeats). During catheter ablation, a long, thin, flexible tube is put into a blood vessel in your groin (upper thigh), or neck. This tube is called an ablation catheter. It is then guided to your heart through the blood vessel. Radio frequency waves destroy small areas of heart tissue where abnormal heartbeats may cause an arrhythmia to start. Please see the instruction sheet given to you today.   Follow-Up: At [facility], you and your health needs are our priority.  As part of our continuing mission to provide you with exceptional heart care, we have created designated Provider Care Teams.  These Care Teams include your primary Cardiologist (physician) and Advanced Practice Providers (APPs -  Physician Assistants and Nurse Practitioners) who all work together to provide you with the care you need, when you need it.  Your next  appointment:   See instruction letter  Important Information About Sugar

## 2022-01-12 NOTE — H&P (View-Only) (Signed)
Cardiology Office Note:    Date:  01/12/2022   ID:  Robert Ochoa, DOB 1945-12-17, MRN [id number]  PCP:  London Pepper, [place] Cardiologist:  None Electrophysiologist:  Melida Quitter, MD     Referring MD: London Pepper, MD   History of Present Illness:    Robert Ochoa is a 76 y.o. male with a hx of arrhythmogenic right ventricular cardiomyopathy, paroxysmal atrial fibrillation, ventricular tachycardia (see ECG 02/14/2009 with right sided VT), St. Jude biventricular ICD, sinus node dysfunction, referred by Dr. Caryl Comes to consider ablation.   He has a history of ARVC based on ECG and MRI criteria. He has had VT and now has an Abbott ICD for secondary prevention. The device generator was replaced in December, 2022. Amio was prescribed for VT, but he also has AF. At his last device visit with Dr. Caryl Comes, his AF burden was noted to have increased, so the amiodarone was also increased.  ECG from 03/27/2021 shows atrial flutter.  Past paceart reports show minimal atrial arrhythmia - usually about a 5% burden of short episodes. In the past few months, however, his burden has been increasing and is now about 32%   Past Medical History:  Diagnosis Date   Arrhythmogenic right ventricular dysplas [facility])    Arrhythmogenic right ventricular dysplasia (HCC)    Hx of VT s/p dual-chamber St. Jude ICD. Cardiac MRI 2010 showed moderately dilated RV with hypokinesis, LV EF 47%. EF 50-55% 2010 but down to 45% 05/2011. Cath 01/2009 without significant CAD.   Atrial fibrillation or flutter    Paroxysmal. Started on Pradaxa 05/2011.   Cardiac defibrillator in place    LV dysfunction    per echo 10/2011; EF 35%  // Echo 2/18: EF 40-45, diff HK, Gr 1 DD, mild dilated aortic root, mod LAE, mod RVE, mild reduced RVSF, mod RAE   Prostate cancer ([place])    Septic shock(785.52) 10-27-11 cause unknown   with associated respiratory/renal failure/PAF/bradycardia   Ventricular  tachycardia ([place])    ICD discharges 05/2011 for this & AF-RVR - started on amiodarone then.   Wears glasses     Past Surgical History:  Procedure Laterality Date   APPENDECTOMY  2002   CARDIAC DEFIBRILLATOR PLACEMENT  02/19/09    St,. Jude    ICD GENERATOR CHANGEOUT N/A 03/27/2021   Procedure: ICD GENERATOR CHANGEOUT;  Surgeon: Thompson Grayer, MD;  Location: [place] [facility];  Service: Cardiovascular;  Laterality: N/A;   IMPLANTABLE CARDIOVERTER DEFIBRILLATOR (ICD) GENERATOR CHANGE N/A 03/01/2013   Procedure: ICD GENERATOR CHANGE;  Surgeon: [name], MD;  Location[facility] C[facility];  Service: Cardiovascular;  Laterality: N/A;   IMPLANTABLE CARDIOVERTER DEFIBRILLATOR GENERATOR CHANGE  03/01/13   for premature battery depletetion.  Now has a SJM Ellipse DR device placed by Dr Caryl Comes   PROSTATE BIOPSY  11/2019   [place] N/A 04/30/2020   Procedure: RADIOACTIVE SEED IMPLANT/BRACHYTHERAPY IMPLANT;  Surgeon: Ceasar Mons, MD;  Location: [facility];  Service: Urology;  Laterality: N/A;   SPACE OAR INSTILLATION N/A 04/30/2020   Procedure: SPACE OAR INSTILLATION;  Surgeon: Ceasar Mons, MD;  Location: [facility];  Service: Urology;  Laterality: N/A;    Current Medications: No outpatient medications have been marked as taking for the 01/12/22 encounter (Appointment) with Pepe Mineau, Yetta Barre, MD.     Allergies:   Patient has no known allergies.   Social History   Socioeconomic History   Marital  status: Single    Spouse name: Not on file   Number of children: 1   Years of education: Not on file   Highest education level: Not on file  Occupational History    Employer: [employer]    Comment: Psychologist, sport and exercise  Tobacco Use   Smoking status: Never   Smokeless tobacco: Never  Vaping Use   Vaping Use: Never used  Substance and Sexual Activity   Alcohol use: Yes    Alcohol/week: 1.0 standard drink of alcohol     Types: 1 Cans of beer per week    Comment: monthly   Drug use: No   Sexual activity: Yes    Birth control/protection: Coitus interruptus  Other Topics Concern   Not on file  Social History Narrative   Divorced w 1 child. Self employed.    Social Determinants of Health   Financial Resource Strain: Not on file  Food Insecurity: Not on file  Transportation Needs: Not on file  Physical Activity: Not on file  Stress: Not on file  Social Connections: Not on file     Family History: The patient's family history includes Allergies in his mother; Cancer in his paternal aunt; Heart attack in his father; Prostate cancer in his cousin. There is no history of Colon cancer, Pancreatic cancer, or Breast cancer.  ROS:   Please see the history of present illness.    All other systems reviewed and are negative.  EKGs/Labs/Other Studies Reviewed:    I personally reviewed all prior ECGs available in [facility]. Notably: 03/27/21 - shows atrial flutter 02/14/2009 - VT with a RV origin  TTE 02/14/2021  1. Left ventricular ejection fraction, by estimation, is 55%. The left  ventricle has normal function. The left ventricle has no regional wall  motion abnormalities. There is mild concentric left ventricular  hypertrophy. Left ventricular diastolic  parameters are consistent with Grade I diastolic dysfunction (impaired  relaxation).   2. Right ventricular systolic function is normal. The right ventricular  size is normal. There is normal pulmonary artery systolic pressure.   3. The mitral valve is normal in structure. No evidence of mitral valve  regurgitation. No evidence of mitral stenosis.   4. The aortic valve is tricuspid. Aortic valve regurgitation is not  visualized. Aortic valve sclerosis is present, with no evidence of aortic  valve stenosis.   CMR 02/18/2009 1)    No evidence of fatty infiltration of the RV.  However RV is        clearly abnormal with moderate dilatation and hypokinesis        2)    Mildly reduced LV function due to abnormal septal motion and  PVC's during the exam.  EF 47%  3)    No hyperenhancement of the LV  4)    Mild RA enlargement.      EKG:  Last EKG results: today -- sinus rhythm with conversion to atrial flutter. Left axis deviation.   Recent Labs: 12/26/2021: ALT 25; BUN 21; Creatinine, Ser 1.14; Hemoglobin 16.4; Platelets 191; Potassium 4.9; Sodium 139; TSH 1.020      Physical Exam:    VS:  There were no vitals taken for this visit.    Wt Readings from Last 3 Encounters:  12/26/21 215 lb 9.6 oz (97.8 kg)  06/27/21 216 lb 3.2 oz (98.1 kg)  03/27/21 206 lb (93.4 kg)     GEN: Well nourished, well developed in no acute distress CARDIAC: RRR, no murmurs, rubs, gallops  RESPIRATORY:  Normal work of breathing MUSCULOSKELETAL: no edema    ASSESSMENT & PLAN:    Paroxysmal atrial fibrillation and flutter: with increasing burden, history of cardiomyopathy, I think rhythm control is appropriate. The patient and I had a prolonged discussion. His burden is increasing despite having increased amiodarone, and he would like to minimize his dose of amiodarone. He does have some fatigue and shortness of breath but no significant palpitations. We discussed the indication, rationale, logistics, anticipated benefits, and potential risks of the ablation procedure including but not limited to -- bleed at the groin access site, chest pain, damage to nearby organs such as the diaphragm, hungs, or esophagus, need for a drainage tube, or prolonged hospitalization. I explained that the risk for stroke, heart attack, need for open chest surgery, or even death is very low but not zero. He expressed understanding and wishes to proceed.  ARVC: resume previous dose of amiodarone VT: on amiodarone Amiodarone therapy Sinus bradycardia           Medication Adjustments/Labs and Tests Ordered: Current medicines are reviewed at length with the patient today.   Concerns regarding medicines are outlined above.  No orders of the defined types were placed in this encounter.  No orders of the defined types were placed in this encounter.    Signed, Melida Quitter, MD  01/12/2022 1:51 PM    [place]

## 2022-01-13 ENCOUNTER — Encounter: Payer: Self-pay | Admitting: Internal Medicine

## 2022-01-13 DIAGNOSIS — F4322 Adjustment disorder with anxiety: Secondary | ICD-10-CM | POA: Diagnosis not present

## 2022-01-19 ENCOUNTER — Ambulatory Visit: Payer: Medicare Other | Attending: Cardiovascular Disease

## 2022-01-19 DIAGNOSIS — I4891 Unspecified atrial fibrillation: Secondary | ICD-10-CM | POA: Diagnosis not present

## 2022-01-19 DIAGNOSIS — I420 Dilated cardiomyopathy: Secondary | ICD-10-CM | POA: Diagnosis not present

## 2022-01-19 DIAGNOSIS — I4729 Other ventricular tachycardia: Secondary | ICD-10-CM

## 2022-01-20 ENCOUNTER — Other Ambulatory Visit: Payer: Self-pay

## 2022-01-20 DIAGNOSIS — Z23 Encounter for immunization: Secondary | ICD-10-CM | POA: Diagnosis not present

## 2022-01-20 LAB — CBC WITH DIFFERENTIAL/PLATELET
Basophils Absolute: 0 10*3/uL (ref 0.0–0.2)
Basos: 1 %
EOS (ABSOLUTE): 0.1 10*3/uL (ref 0.0–0.4)
Eos: 2 %
Hematocrit: 50.9 % (ref 37.5–51.0)
Hemoglobin: 16.2 g/dL (ref 13.0–17.7)
Immature Grans (Abs): 0 10*3/uL (ref 0.0–0.1)
Immature Granulocytes: 0 %
Lymphocytes Absolute: 1.3 10*3/uL (ref 0.7–3.1)
Lymphs: 27 %
MCH: 28.4 pg (ref 26.6–33.0)
MCHC: 31.8 g/dL (ref 31.5–35.7)
MCV: 89 fL (ref 79–97)
Monocytes Absolute: 0.7 10*3/uL (ref 0.1–0.9)
Monocytes: 15 %
Neutrophils Absolute: 2.7 10*3/uL (ref 1.4–7.0)
Neutrophils: 55 %
Platelets: 174 10*3/uL (ref 150–450)
RBC: 5.7 x10E6/uL (ref 4.14–5.80)
RDW: 12.8 % (ref 11.6–15.4)
WBC: 5 10*3/uL (ref 3.4–10.8)

## 2022-01-20 LAB — BASIC METABOLIC PANEL
BUN/Creatinine Ratio: 15 (ref 10–24)
BUN: 20 mg/dL (ref 8–27)
CO2: 24 mmol/L (ref 20–29)
Calcium: 9 mg/dL (ref 8.6–10.2)
Chloride: 104 mmol/L (ref 96–106)
Creatinine, Ser: 1.33 mg/dL — ABNORMAL HIGH (ref 0.76–1.27)
Glucose: 93 mg/dL (ref 70–99)
Potassium: 4.9 mmol/L (ref 3.5–5.2)
Sodium: 140 mmol/L (ref 134–144)
eGFR: 55 mL/min/{1.73_m2} — ABNORMAL LOW (ref >59)

## 2022-01-20 MED ORDER — FUROSEMIDE 20 MG PO TABS: ORAL_TABLET | ORAL | 8 refills | Status: DC

## 2022-01-20 NOTE — Progress Notes (Signed)
Remote pacemaker transmission.   

## 2022-01-27 DIAGNOSIS — F4322 Adjustment disorder with anxiety: Secondary | ICD-10-CM | POA: Diagnosis not present

## 2022-01-29 ENCOUNTER — Telehealth (HOSPITAL_COMMUNITY): Payer: Self-pay | Admitting: *Deleted

## 2022-01-29 NOTE — Telephone Encounter (Signed)
Reaching out to patient to offer assistance regarding upcoming cardiac imaging study; pt verbalizes understanding of appt date/time, parking situation and where to check in, , and verified current allergies; name and call back number provided for further questions should they arise  Gordy Clement RN Navigator Cardiac [place] [facility] [phone] office [phone] cell

## 2022-01-30 ENCOUNTER — Ambulatory Visit (HOSPITAL_BASED_OUTPATIENT_CLINIC_OR_DEPARTMENT_OTHER)
Admission: RE | Admit: 2022-01-30 | Discharge: 2022-01-30 | Disposition: A | Discharge status: Home / Self Care | Payer: Medicare Other | Source: Ambulatory Visit | Attending: Cardiovascular Disease | Admitting: Cardiovascular Disease

## 2022-01-30 DIAGNOSIS — I4891 Unspecified atrial fibrillation: Secondary | ICD-10-CM | POA: Diagnosis not present

## 2022-01-30 DIAGNOSIS — I4729 Other ventricular tachycardia: Secondary | ICD-10-CM | POA: Insufficient documentation

## 2022-01-30 DIAGNOSIS — I420 Dilated cardiomyopathy: Secondary | ICD-10-CM | POA: Diagnosis not present

## 2022-01-30 MED ORDER — IOHEXOL 350 MG/ML SOLN
100.0000 mL | Freq: Once | INTRAVENOUS | Status: AC | PRN
  Administered 2022-01-30: 80 mL via INTRAVENOUS

## 2022-02-02 ENCOUNTER — Telehealth: Payer: Self-pay | Admitting: Cardiovascular Disease

## 2022-02-02 NOTE — Telephone Encounter (Signed)
Radiology department calling to make sure Dr. Myles Gip is made aware of the the addendum report for CT cardiac test for this patient.

## 2022-02-03 NOTE — Pre-Procedure Instructions (Signed)
Instructed patient on the following items: Arrival time 0630 Nothing to eat or drink after midnight No meds AM of procedure Responsible person to drive you home and stay with you for 24 hrs  Have you missed any doses of anti-coagulant Eliquis- hasn't missed any doses

## 2022-02-03 NOTE — Anesthesia Preprocedure Evaluation (Addendum)
Anesthesia Evaluation  Patient identified by MRN, date of birth, ID band Patient awake    Reviewed: Allergy & Precautions, NPO status , Patient's Chart, lab work & pertinent test results  Airway Mallampati: II  TM Distance: >3 FB Neck ROM: Full    Dental no notable dental hx.    Pulmonary neg pulmonary ROS   Pulmonary exam normal        Cardiovascular hypertension, Pt. on medications + dysrhythmias Atrial Fibrillation + Cardiac Defibrillator  Rhythm:Irregular Rate:Normal  ECHO 2022   1. Left ventricular ejection fraction, by estimation, is 55%. The left  ventricle has normal function. The left ventricle has no regional wall  motion abnormalities. There is mild concentric left ventricular  hypertrophy. Left ventricular diastolic  parameters are consistent with Grade I diastolic dysfunction (impaired  relaxation).   2. Right ventricular systolic function is normal. The right ventricular  size is normal. There is normal pulmonary artery systolic pressure.   3. The mitral valve is normal in structure. No evidence of mitral valve  regurgitation. No evidence of mitral stenosis.   4. The aortic valve is tricuspid. Aortic valve regurgitation is not  visualized. Aortic valve sclerosis is present, with no evidence of aortic  valve stenosis.   Comparison(s): A prior study was performed on 06/10/20. No significant  change from prior study.      Neuro/Psych    Depression    negative neurological ROS     GI/Hepatic negative GI ROS, Neg liver ROS,,,  Endo/Other  negative endocrine ROS    Renal/GU negative Renal ROS  negative genitourinary   Musculoskeletal negative musculoskeletal ROS (+)    Abdominal Normal abdominal exam  (+)   Peds  Hematology negative hematology ROS (+)   Anesthesia Other Findings   Reproductive/Obstetrics                             Anesthesia Physical Anesthesia  Plan  ASA: 3  Anesthesia Plan: General   Post-op Pain Management:    Induction: Intravenous  PONV Risk Score and Plan: 2 and Ondansetron, Dexamethasone and Treatment may vary due to age or medical condition  Airway Management Planned: Mask and Oral ETT  Additional Equipment: None  Intra-op Plan:   Post-operative Plan: Extubation in OR  Informed Consent: I have reviewed the patients History and Physical, chart, labs and discussed the procedure including the risks, benefits and alternatives for the proposed anesthesia with the patient or authorized representative who has indicated his/her understanding and acceptance.     Dental advisory given  Plan Discussed with: CRNA  Anesthesia Plan Comments: (Lab Results      Component                Value               Date                      WBC                      5.0                 01/19/2022                HGB                      16.2  01/19/2022                HCT                      50.9                01/19/2022                MCV                      89                  01/19/2022                PLT                      174                 01/19/2022            Lab Results      Component                Value               Date                      NA                       140                 01/19/2022                K                        4.9                 01/19/2022                CO2                      24                  01/19/2022                GLUCOSE                  93                  01/19/2022                BUN                      20                  01/19/2022                CREATININE               1.33 (H)            01/19/2022                CALCIUM                  9.0                 01/19/2022  EGFR                     55 (L)              01/19/2022                GFRNONAA                 >60                 04/26/2020           )       Anesthesia Quick  Evaluation

## 2022-02-04 ENCOUNTER — Ambulatory Visit (HOSPITAL_BASED_OUTPATIENT_CLINIC_OR_DEPARTMENT_OTHER): Payer: Medicare Other | Admitting: Anesthesiology

## 2022-02-04 ENCOUNTER — Encounter (HOSPITAL_COMMUNITY)
Admission: RE | Disposition: A | Discharge status: Home / Self Care | Payer: Self-pay | Source: Home / Self Care | Attending: Cardiovascular Disease

## 2022-02-04 ENCOUNTER — Other Ambulatory Visit: Payer: Self-pay

## 2022-02-04 ENCOUNTER — Other Ambulatory Visit (HOSPITAL_COMMUNITY): Payer: Self-pay

## 2022-02-04 ENCOUNTER — Ambulatory Visit (HOSPITAL_COMMUNITY): Payer: Medicare Other | Admitting: Anesthesiology

## 2022-02-04 ENCOUNTER — Ambulatory Visit (HOSPITAL_COMMUNITY)
Admission: RE | Admit: 2022-02-04 | Discharge: 2022-02-04 | Disposition: A | Discharge status: Home / Self Care | Payer: Medicare Other | Attending: Cardiovascular Disease | Admitting: Cardiovascular Disease

## 2022-02-04 DIAGNOSIS — I10 Essential (primary) hypertension: Secondary | ICD-10-CM | POA: Insufficient documentation

## 2022-02-04 DIAGNOSIS — I480 Paroxysmal atrial fibrillation: Secondary | ICD-10-CM | POA: Insufficient documentation

## 2022-02-04 DIAGNOSIS — I483 Typical atrial flutter: Secondary | ICD-10-CM

## 2022-02-04 DIAGNOSIS — I4891 Unspecified atrial fibrillation: Secondary | ICD-10-CM | POA: Diagnosis not present

## 2022-02-04 DIAGNOSIS — Z95810 Presence of automatic (implantable) cardiac defibrillator: Secondary | ICD-10-CM | POA: Insufficient documentation

## 2022-02-04 DIAGNOSIS — R001 Bradycardia, unspecified: Secondary | ICD-10-CM | POA: Insufficient documentation

## 2022-02-04 DIAGNOSIS — I4892 Unspecified atrial flutter: Secondary | ICD-10-CM | POA: Insufficient documentation

## 2022-02-04 HISTORY — PX: ATRIAL FIBRILLATION ABLATION: EP1191

## 2022-02-04 LAB — POCT ACTIVATED CLOTTING TIME
Activated Clotting Time: 281 seconds
Activated Clotting Time: 305 seconds
Activated Clotting Time: 305 seconds
Activated Clotting Time: 293 seconds
Activated Clotting Time: 317 seconds

## 2022-02-04 SURGERY — ATRIAL FIBRILLATION ABLATION
Anesthesia: General

## 2022-02-04 MED ORDER — LIDOCAINE 2% (20 MG/ML) 5 ML SYRINGE
INTRAMUSCULAR | Status: DC | PRN
  Administered 2022-02-04: 30 mg via INTRAVENOUS

## 2022-02-04 MED ORDER — SUGAMMADEX SODIUM 200 MG/2ML IV SOLN
INTRAVENOUS | Status: DC | PRN
  Administered 2022-02-04: 400 mg via INTRAVENOUS

## 2022-02-04 MED ORDER — HEPARIN (PORCINE) IN NACL 1000-0.9 UT/500ML-% IV SOLN
INTRAVENOUS | Status: DC | PRN
  Administered 2022-02-04 (×4): 500 mL

## 2022-02-04 MED ORDER — HEPARIN SODIUM (PORCINE) 1000 UNIT/ML IJ SOLN
INTRAMUSCULAR | Status: DC | PRN
  Administered 2022-02-04: 1000 [IU] via INTRAVENOUS

## 2022-02-04 MED ORDER — PROPOFOL 10 MG/ML IV BOLUS
INTRAVENOUS | Status: DC | PRN
  Administered 2022-02-04: 150 mg via INTRAVENOUS

## 2022-02-04 MED ORDER — ROCURONIUM BROMIDE 10 MG/ML (PF) SYRINGE
PREFILLED_SYRINGE | INTRAVENOUS | Status: DC | PRN
  Administered 2022-02-04: 60 mg via INTRAVENOUS
  Administered 2022-02-04: 10 mg via INTRAVENOUS

## 2022-02-04 MED ORDER — COLCHICINE 0.6 MG PO TABS
0.6000 mg | ORAL_TABLET | Freq: Two times a day (BID) | ORAL | 0 refills | Status: DC
  Filled 2022-02-04: qty 10, 5d supply, fill #0

## 2022-02-04 MED ORDER — EPHEDRINE SULFATE-NACL 50-0.9 MG/10ML-% IV SOSY
PREFILLED_SYRINGE | INTRAVENOUS | Status: DC | PRN
  Administered 2022-02-04 (×2): 5 mg via INTRAVENOUS

## 2022-02-04 MED ORDER — HEPARIN SODIUM (PORCINE) 1000 UNIT/ML IJ SOLN
INTRAMUSCULAR | Status: DC | PRN
  Administered 2022-02-04: 3000 [IU] via INTRAVENOUS
  Administered 2022-02-04: 15000 [IU] via INTRAVENOUS

## 2022-02-04 MED ORDER — SODIUM CHLORIDE 0.9% FLUSH: 3.0000 mL | INTRAVENOUS | Status: DC | PRN

## 2022-02-04 MED ORDER — PROTAMINE SULFATE 10 MG/ML IV SOLN
INTRAVENOUS | Status: DC | PRN
  Administered 2022-02-04 (×3): 10 mg via INTRAVENOUS

## 2022-02-04 MED ORDER — ACETAMINOPHEN 325 MG PO TABS: 650.0000 mg | ORAL_TABLET | ORAL | Status: DC | PRN

## 2022-02-04 MED ORDER — SODIUM CHLORIDE 0.9 % IV SOLN: INTRAVENOUS | Status: DC

## 2022-02-04 MED ORDER — PANTOPRAZOLE SODIUM 40 MG PO TBEC
40.0000 mg | DELAYED_RELEASE_TABLET | Freq: Every day | ORAL | 0 refills | Status: DC
  Filled 2022-02-04: qty 30, 30d supply, fill #0

## 2022-02-04 MED ORDER — HEPARIN SODIUM (PORCINE) 1000 UNIT/ML IJ SOLN
INTRAMUSCULAR | Status: AC
  Filled 2022-02-04: qty 10

## 2022-02-04 MED ORDER — SODIUM CHLORIDE 0.9 % IV SOLN: 250.0000 mL | INTRAVENOUS | Status: DC | PRN

## 2022-02-04 MED ORDER — LACTATED RINGERS IV SOLN: INTRAVENOUS | Status: DC | PRN

## 2022-02-04 MED ORDER — FENTANYL CITRATE (PF) 250 MCG/5ML IJ SOLN
INTRAMUSCULAR | Status: DC | PRN
  Administered 2022-02-04 (×2): 50 ug via INTRAVENOUS
  Administered 2022-02-04: 100 ug via INTRAVENOUS

## 2022-02-04 MED ORDER — PHENYLEPHRINE 80 MCG/ML (10ML) SYRINGE FOR IV PUSH (FOR BLOOD PRESSURE SUPPORT)
PREFILLED_SYRINGE | INTRAVENOUS | Status: DC | PRN
  Administered 2022-02-04: 120 ug via INTRAVENOUS
  Administered 2022-02-04 (×2): 100 ug via INTRAVENOUS
  Administered 2022-02-04: 120 ug via INTRAVENOUS

## 2022-02-04 MED ORDER — ONDANSETRON HCL 4 MG/2ML IJ SOLN: 4.0000 mg | Freq: Four times a day (QID) | INTRAMUSCULAR | Status: DC | PRN

## 2022-02-04 MED ORDER — SODIUM CHLORIDE 0.9% FLUSH: 3.0000 mL | Freq: Two times a day (BID) | INTRAVENOUS | Status: DC

## 2022-02-04 MED ORDER — DOBUTAMINE INFUSION FOR EP/ECHO/NUC (1000 MCG/ML)
INTRAVENOUS | Status: DC | PRN
  Administered 2022-02-04: 5 ug/kg/min via INTRAVENOUS

## 2022-02-04 MED ORDER — PHENYLEPHRINE HCL-NACL 20-0.9 MG/250ML-% IV SOLN
INTRAVENOUS | Status: DC | PRN
  Administered 2022-02-04: 25 ug/min via INTRAVENOUS

## 2022-02-04 SURGICAL SUPPLY — 18 items
CATH ABLAT QDOT MICRO BI TC DF (CATHETERS) IMPLANT
CATH OCTARAY 2.0 F 3-3-3-3-3 (CATHETERS) IMPLANT
CATH PIGTAIL STEERABLE D1 8.7 (WIRE) IMPLANT
CATH S-M CIRCA TEMP PROBE (CATHETERS) IMPLANT
CATH SOUNDSTAR ECO 8FR (CATHETERS) IMPLANT
CATH WEB BI DIR CSDF CRV REPRO (CATHETERS) IMPLANT
CLOSURE PERCLOSE PROSTYLE (VASCULAR PRODUCTS) IMPLANT
COVER SWIFTLINK CONNECTOR (BAG) ×1 IMPLANT
DEVICE CLOSURE MYNXGRIP 6/7F (Vascular Products) IMPLANT
PACK EP LATEX FREE (CUSTOM PROCEDURE TRAY) ×1
PACK EP LF (CUSTOM PROCEDURE TRAY) ×1 IMPLANT
PAD DEFIB RADIO PHYSIO CONN (PAD) ×1 IMPLANT
PATCH CARTO3 (PAD) IMPLANT
SHEATH CARTO VIZIGO SM CVD (SHEATH) IMPLANT
SHEATH PINNACLE 8F 10CM (SHEATH) IMPLANT
SHEATH PINNACLE 9F 10CM (SHEATH) IMPLANT
SHEATH PROBE COVER 6X72 (BAG) IMPLANT
TUBING SMART ABLATE COOLFLOW (TUBING) IMPLANT

## 2022-02-04 NOTE — Discharge Instructions (Addendum)
Post procedure care instructions No driving for 4 days. No lifting over 5 lbs for 1 week. No vigorous or sexual activity for 1 week. You may return to work/your usual activities on 02/12/22. Keep procedure site clean & dry. If you notice increased pain, swelling, bleeding or pus, call/return!  You may shower after 24 hours, but no soaking in baths/hot tubs/pools for 1 week.    You have an appointment set up with the [facility].  Multiple studies have shown that being followed by a dedicated atrial fibrillation clinic in addition to the standard care you receive from your other physicians improves health. We believe that enrollment in the atrial fibrillation clinic will allow [place] to better care for you.   The phone number to the [facility] is [phone]. The clinic is staffed Monday through Friday from 8:30am to 5pm.  Directions: The clinic is located in the [facility] [place], [place] the hospital at the MAIN ENTRANCE "A", use [address] to the 6th floor.  Registration desk to the right of elevators on 6th floor  If you have any trouble locating the clinic, please don't hesitate to call [phone].    Cardiac Ablation, Care After  This sheet gives you information about how to care for yourself after your procedure. Your health care provider may also give you more specific instructions. If you have problems or questions, contact your health care provider. What can I expect after the procedure? After the procedure, it is common to have: Bruising around your puncture site. Tenderness around your puncture site. Skipped heartbeats. If you had an atrial fibrillation ablation, you may have atrial fibrillation during the first several months after your procedure.  Tiredness (fatigue).  Follow these instructions at home: Puncture site care  Follow instructions from your health care provider about how to take care of your puncture site. Make  sure you: If present, leave stitches (sutures), skin glue, or adhesive strips in place. These skin closures may need to stay in place for up to 2 weeks. If adhesive strip edges start to loosen and curl up, you may trim the loose edges. Do not remove adhesive strips completely unless your health care provider tells you to do that. If a large square bandage is present, this may be removed 24 hours after surgery.  Check your puncture site every day for signs of infection. Check for: Redness, swelling, or pain. Fluid or blood. If your puncture site starts to bleed, lie down on your back, apply firm pressure to the area, and contact your health care provider. Warmth. Pus or a bad smell. A pea or small marble sized lump at the site is normal and can take up to three months to resolve.  Driving Do not drive for at least 4 days after your procedure or however long your health care provider recommends. (Do not resume driving if you have previously been instructed not to drive for other health reasons.) Do not drive or use heavy machinery while taking prescription pain medicine. Activity Avoid activities that take a lot of effort for at least 7 days after your procedure. Do not lift anything that is heavier than 5 lb (4.5 kg) for one week.  No sexual activity for 1 week.  Return to your normal activities as told by your health care provider. Ask your health care provider what activities are safe for you. General instructions Take over-the-counter and prescription medicines only as told by your health care provider. Do  not use any products that contain nicotine or tobacco, such as cigarettes and e-cigarettes. If you need help quitting, ask your health care provider. You may shower after 24 hours, but Do not take baths, swim, or use a hot tub for 1 week.  Do not drink alcohol for 24 hours after your procedure. Keep all follow-up visits as told by your health care provider. This is important. Contact a  health care provider if: You have redness, mild swelling, or pain around your puncture site. You have fluid or blood coming from your puncture site that stops after applying firm pressure to the area. Your puncture site feels warm to the touch. You have pus or a bad smell coming from your puncture site. You have a fever. You have chest pain or discomfort that spreads to your neck, jaw, or arm. You have chest pain that is worse with lying on your back or taking a deep breath. You are sweating a lot. You feel nauseous. You have a fast or irregular heartbeat. You have shortness of breath. You are dizzy or light-headed and feel the need to lie down. You have pain or numbness in the arm or leg closest to your puncture site. Get help right away if: Your puncture site suddenly swells. Your puncture site is bleeding and the bleeding does not stop after applying firm pressure to the area. These symptoms may represent a serious problem that is an emergency. Do not wait to see if the symptoms will go away. Get medical help right away. Call your local emergency services (911 in the [place] Do not drive yourself to the hospital. Summary After the procedure, it is normal to have bruising and tenderness at the puncture site in your groin, neck, or forearm. Check your puncture site every day for signs of infection. Get help right away if your puncture site is bleeding and the bleeding does not stop after applying firm pressure to the area. This is a medical emergency. This information is not intended to replace advice given to you by your health care provider. Make sure you discuss any questions you have with your health care provider.

## 2022-02-04 NOTE — Anesthesia Postprocedure Evaluation (Signed)
Anesthesia Post Note  Patient: Robert Ochoa  Procedure(s) Performed: ATRIAL FIBRILLATION ABLATION     Patient location during evaluation: PACU Anesthesia Type: General Level of consciousness: awake and alert Pain management: pain level controlled Vital Signs Assessment: post-procedure vital signs reviewed and stable Respiratory status: spontaneous breathing, nonlabored ventilation, respiratory function stable and patient connected to nasal cannula oxygen Cardiovascular status: blood pressure returned to baseline and stable Postop Assessment: no apparent nausea or vomiting Anesthetic complications: no   There were no known notable events for this encounter.  Last Vitals:  Vitals:   02/04/22 1500 02/04/22 1537  BP: 92/71   Pulse: 71 67  Resp: 20 20  Temp:    SpO2: 92% 92%    Last Pain:  Vitals:   02/04/22 1315  TempSrc:   PainSc: 0-No pain                 Belenda Cruise P Renaye Janicki

## 2022-02-04 NOTE — Transfer of Care (Signed)
Immediate Anesthesia Transfer of Care Note  Patient: Robert Ochoa  Procedure(s) Performed: ATRIAL FIBRILLATION ABLATION  Patient Location: PACU and Cath Lab  Anesthesia Type:General  Level of Consciousness: awake, alert , and oriented  Airway & Oxygen Therapy: Patient Spontanous Breathing  Post-op Assessment: Report given to RN and Post -op Vital signs reviewed and stable  Post vital signs: Reviewed and stable  Last Vitals:  Vitals Value Taken Time  BP 87/56   Temp    Pulse 63 02/04/22 1214  Resp 10 02/04/22 1214  SpO2 97 % 02/04/22 1214  Vitals shown include unvalidated device data.  Last Pain:  Vitals:   02/04/22 0705  TempSrc: Temporal  PainSc:          Complications: There were no known notable events for this encounter.

## 2022-02-04 NOTE — Interval H&P Note (Signed)
History and Physical Interval Note:  02/04/2022 7:17 AM  Robert Ochoa  has presented today for surgery, with the diagnosis of atrial fibrillation.  The various methods of treatment have been discussed with the patient and family. After consideration of risks, benefits and other options for treatment, the patient has consented to  Procedure(s): ATRIAL FIBRILLATION ABLATION (N/A) as a surgical intervention.  The patient's history has been reviewed, patient examined, no change in status, stable for surgery.  I have reviewed the patient's chart and labs.  Questions were answered to the patient's satisfaction.     [name]

## 2022-02-05 ENCOUNTER — Encounter (HOSPITAL_COMMUNITY): Payer: Self-pay | Admitting: Cardiovascular Disease

## 2022-02-05 ENCOUNTER — Ambulatory Visit: Payer: Medicare Other | Admitting: Podiatry

## 2022-02-09 ENCOUNTER — Encounter: Payer: Self-pay | Admitting: Cardiovascular Disease

## 2022-02-10 DIAGNOSIS — F4322 Adjustment disorder with anxiety: Secondary | ICD-10-CM | POA: Diagnosis not present

## 2022-02-24 DIAGNOSIS — F4322 Adjustment disorder with anxiety: Secondary | ICD-10-CM | POA: Diagnosis not present

## 2022-03-04 ENCOUNTER — Encounter (HOSPITAL_COMMUNITY): Payer: Self-pay | Admitting: Physician Assistant

## 2022-03-04 ENCOUNTER — Ambulatory Visit (HOSPITAL_COMMUNITY)
Admission: RE | Admit: 2022-03-04 | Discharge: 2022-03-04 | Disposition: A | Discharge status: Home / Self Care | Payer: Medicare Other | Source: Ambulatory Visit | Attending: Physician Assistant | Admitting: Physician Assistant

## 2022-03-04 VITALS — BP 122/88 | HR 80 | Ht 74.0 in | Wt 216.0 lb

## 2022-03-04 DIAGNOSIS — E785 Hyperlipidemia, unspecified: Secondary | ICD-10-CM | POA: Diagnosis not present

## 2022-03-04 DIAGNOSIS — I480 Paroxysmal atrial fibrillation: Secondary | ICD-10-CM | POA: Diagnosis not present

## 2022-03-04 DIAGNOSIS — Z7901 Long term (current) use of anticoagulants: Secondary | ICD-10-CM | POA: Diagnosis not present

## 2022-03-04 DIAGNOSIS — J849 Interstitial pulmonary disease, unspecified: Secondary | ICD-10-CM | POA: Insufficient documentation

## 2022-03-04 DIAGNOSIS — I472 Ventricular tachycardia, unspecified: Secondary | ICD-10-CM | POA: Insufficient documentation

## 2022-03-04 DIAGNOSIS — D6869 Other thrombophilia: Secondary | ICD-10-CM

## 2022-03-04 DIAGNOSIS — I4892 Unspecified atrial flutter: Secondary | ICD-10-CM | POA: Insufficient documentation

## 2022-03-04 NOTE — Progress Notes (Signed)
Primary Care Physician: London Pepper, MD Primary Electrophysiologist: Dr Myles Gip Referring Physician: Dr Rayann Heman   Robert Ochoa is a 76 y.o. male with a history of ARVC, VT, ILD, HLD, LV dysfunction, and atrial fibrillation who presents for follow up in the [facility]. Patient has been maintained on amiodarone. Patient is on Eliquis for a CHADS2VASC score of 3. The device clinic received an alert for an ongoing afib episode starting 07/21/20. Repeat device interrogation showed he was back in SR on 4/25. He was unaware of his arrhythmia, possibly had some mild fatigue but couldn't be sure. Unfortunately, his afib burden continued to increase on his ICD. He was seen by Dr Myles Gip and underwent afib and flutter ablation on 02/04/22.  On follow up today, patient reports that he has done well since the ablation. He does not feel much different but he was fairly asymptomatic with his afib prior to ablation. He denies chest pain, swallowing pain, or groin issues. He is in SR today.   Today, he denies symptoms of palpitations, chest pain, shortness of breath, orthopnea, PND, lower extremity edema, dizziness, presyncope, syncope, snoring, daytime somnolence, bleeding, or neurologic sequela. The patient is tolerating medications without difficulties and is otherwise without complaint today.    Atrial Fibrillation Risk Factors:  he does not have symptoms or diagnosis of sleep apnea. he does not have a history of rheumatic fever.   he has a BMI of Body mass index is 27.73 kg/m.[name] Filed Weights   03/04/22 0954  Weight: 98 kg    Family History  Problem Relation Age of Onset   Heart attack Father    Allergies Mother    Cancer Paternal Aunt        type unknown   Prostate cancer Cousin    Colon cancer Neg Hx    Pancreatic cancer Neg Hx    Breast cancer Neg Hx      Atrial Fibrillation Management history:  Previous antiarrhythmic drugs: amiodarone (patient wishes  to continue despite ILD) Previous cardioversions: none Previous ablations: 02/04/22 CHADS2VASC score: 3 Anticoagulation history: Eliquis   Past Medical History:  Diagnosis Date   Arrhythmogenic right ventricular dysplas ([place])    Arrhythmogenic right ventricular dysplasia (H[facility])    Hx of VT s/p dual-chamber St. Jude ICD. Cardiac MRI 2010 showed moderately dilated RV with hypokinesis, LV EF 47%. EF 50-55% 2010 but down to 45% 05/2011. Cath 01/2009 without significant CAD.   Atrial fibrillation or flutter    Paroxysmal. Started on Pradaxa 05/2011.   Cardiac defibrillator in place    LV dysfunction    per echo 10/2011; EF 35%  // Echo 2/18: EF 40-45, diff HK, Gr 1 DD, mild dilated aortic root, mod LAE, mod RVE, mild reduced RVSF, mod RAE   Prostate cancer ([place])    Septic shock(785.52) 10-27-11 cause unknown   with associated respiratory/renal failure/PAF/bradycardia   Ventricular tachycardia ([place])    ICD discharges 05/2011 for this & AF-RVR - started on amiodarone then.   Wears glasses    Past Surgical History:  Procedure Laterality Date   APPENDECTOMY  2002   ATRIAL FIBRILLATION ABLATION N/A 02/04/2022   Procedure: ATRIAL FIBRILLATION ABLATION;  Surgeon: Melida Quitter, MD;  Location: [place] [facility];  Service: Cardiovascular;  Laterality: N/A;   CARDIAC DEFIBRILLATOR PLACEMENT  02/19/09    St,. Jude    ICD GENERATOR CHANGEOUT N/A 03/27/2021   Procedure: ICD GENERATOR CHANGEOUT;  Surgeon: Thompson Grayer, MD;  [facility]  INVASIVE CV LAB;  Service: Cardiovascular;  Laterality: N/A;   IMPLANTABLE CARDIOVERTER DEFIBRILLATOR (ICD) GENERATOR CHANGE N/A 03/01/2013   Procedure: ICD GENERATOR CHANGE;  Surgeon: [name], MD;  Location: [facility] CATH LAB;  Service: Cardiovascular;  Laterality: N/A;   IMPLANTABLE CARDIOVERTER DEFIBRILLATOR GENERATOR CHANGE  03/01/13   for premature battery depletetion.  Now has a SJM Ellipse DR device placed by Dr Caryl Comes   PROSTATE BIOPSY  11/2019   [place] N/A 04/30/2020   Procedure: RADIOACTIVE SEED IMPLANT/BRACHYTHERAPY IMPLANT;  Surgeon: Ceasar Mons, MD;  Location: [facility];  Service: Urology;  Laterality: N/A;   SPACE OAR INSTILLATION N/A 04/30/2020   Procedure: SPACE OAR INSTILLATION;  Surgeon: Ceasar Mons, MD;  Location: [facility];  Service: Urology;  Laterality: N/A;    Current Outpatient Medications  Medication Sig Dispense Refill   amiodarone (PACERONE) 200 MG tablet Take 1 tablet (200 mg total) by mouth daily. (Patient taking differently: Take 100 mg by mouth daily.) 90 tablet 3   Calcium-Magnesium-Zinc (CAL-MAG-ZINC PO) Take 2 tablets by mouth daily.     ELIQUIS 5 MG TABS tablet TAKE 1 TABLET BY MOUTH TWICE DAILY 60 tablet 5   furosemide (LASIX) 20 MG tablet Take 1 tablet by mouth daily as needed for swelling. 30 tablet 8   lisinopril (ZESTRIL) 10 MG tablet TAKE 2 TABLETS BY MOUTH EVERY DAY 180 tablet 3   Omega-3 Fatty Acids (OMEGA 3 PO) Take 4,800 mg by mouth daily.     pantoprazole (PROTONIX) 40 MG tablet Take 1 tablet (40 mg total) by mouth daily. 30 tablet 0   sildenafil (REVATIO) 20 MG tablet Take 20-100 mg by mouth daily as needed (ED).     tamsulosin (FLOMAX) 0.4 MG CAPS capsule Take 0.4 mg by mouth daily.     vitamin C (ASCORBIC ACID) 500 MG tablet Take 1,000 mg by mouth daily.     colchicine 0.6 MG tablet Take 1 tablet (0.6 mg total) by mouth 2 (two) times daily for 5 days. 10 tablet 0   No current facility-administered medications for this encounter.    No Known Allergies  Social History   Socioeconomic History   Marital status: Single    Spouse name: Not on file   Number of children: 1   Years of education: Not on file   Highest education level: Not on file  Occupational History    Employer: [employer]    Comment: Psychologist, sport and exercise  Tobacco Use   Smoking status: Never   Smokeless tobacco: Never   Tobacco comments:    Never smoke  03/04/22  Vaping Use   Vaping Use: Never used  Substance and Sexual Activity   Alcohol use: Yes    Alcohol/week: 1.0 standard drink of alcohol    Types: 1 Cans of beer per week    Comment: monthly   Drug use: No   Sexual activity: Yes    Birth control/protection: Coitus interruptus  Other Topics Concern   Not on file  Social History Narrative   Divorced w 1 child. Self employed.    Social Determinants of Health   Financial Resource Strain: Not on file  Food Insecurity: Not on file  Transportation Needs: Not on file  Physical Activity: Not on file  Stress: Not on file  Social Connections: Not on file  Intimate Partner Violence: Not on file     ROS- All systems are reviewed and negative except as per the HPI  above.  Physical Exam: Vitals:   03/04/22 0954  BP: 122/88  Pulse: 80  Weight: 98 kg  Height: '6\' 2"'$  (1.88 m)    GEN- The patient is a well appearing elderly male, alert and oriented x 3 today.   HEENT-head normocephalic, atraumatic, sclera clear, conjunctiva pink, hearing intact, trachea midline. Lungs- Clear to ausculation bilaterally, normal work of breathing Heart- Regular rate and rhythm, no murmurs, rubs or gallops  GI- soft, NT, ND, + BS Extremities- no clubbing, cyanosis, or edema MS- no significant deformity or atrophy Skin- no rash or lesion Psych- euthymic mood, full affect Neuro- strength and sensation are intact   Wt Readings from Last 3 Encounters:  03/04/22 98 kg  02/04/22 94.8 kg  01/12/22 96.7 kg    EKG today demonstrates  SR, 1st degree AV block Vent. rate 80 BPM PR interval 208 ms QRS duration 122 ms QT/QTcB 390/449 ms  Echo 02/14/21 demonstrated   1. Left ventricular ejection fraction, by estimation, is 55%. The left  ventricle has normal function. The left ventricle has no regional wall  motion abnormalities. There is mild concentric left ventricular  hypertrophy. Left ventricular diastolic parameters are consistent with Grade  I diastolic dysfunction (impaired relaxation).   2. Right ventricular systolic function is normal. The right ventricular  size is normal. There is normal pulmonary artery systolic pressure.   3. The mitral valve is normal in structure. No evidence of mitral valve  regurgitation. No evidence of mitral stenosis.   4. The aortic valve is tricuspid. Aortic valve regurgitation is not  visualized. Aortic valve sclerosis is present, with no evidence of aortic  valve stenosis.   Comparison(s): A prior study was performed on 06/10/20. No significant  change from prior study.   Epic records are reviewed at length today  CHA2DS2-VASc Score = 3  The patient's score is based upon: CHF History: 1 HTN History: 0 Diabetes History: 0 Stroke History: 0 Vascular Disease History: 0 Age Score: 2 Gender Score: 0        ASSESSMENT AND PLAN: 1. Paroxysmal Atrial Fibrillation/atrial flutter The patient's CHA2DS2-VASc score is 3, indicating a 3.2% annual risk of stroke.   S/p afib and flutter ablation 02/04/22 Patient appears to be maintaining SR. Continue amiodarone 200 mg daily Continue Eliquis 5 mg BID with no missed doses for 3 months post ablation.   2. Secondary Hypercoagulable State (ICD10:  D68.69) The patient is at significant risk for stroke/thromboembolism based upon his CHA2DS2-VASc Score of 3.  Continue Apixaban (Eliquis).   3. ARVC S/p ICD, followed by Dr Caryl Comes and the device clinic.  4. VT Continue amiodarone.    Follow up with Dr Myles Gip as scheduled.    [facility] [address] [place], [place] [address] [phone] 03/04/2022 1:22 PM

## 2022-03-10 DIAGNOSIS — F4322 Adjustment disorder with anxiety: Secondary | ICD-10-CM | POA: Diagnosis not present

## 2022-03-26 ENCOUNTER — Ambulatory Visit (INDEPENDENT_AMBULATORY_CARE_PROVIDER_SITE_OTHER): Payer: Medicare Other | Admitting: Podiatry

## 2022-03-26 ENCOUNTER — Encounter: Payer: Self-pay | Admitting: Podiatry

## 2022-03-26 DIAGNOSIS — M7751 Other enthesopathy of right foot: Secondary | ICD-10-CM | POA: Diagnosis not present

## 2022-03-26 DIAGNOSIS — D2371 Other benign neoplasm of skin of right lower limb, including hip: Secondary | ICD-10-CM

## 2022-03-26 MED ORDER — DEXAMETHASONE SODIUM PHOSPHATE 120 MG/30ML IJ SOLN
2.0000 mg | Freq: Once | INTRAMUSCULAR | Status: AC
  Administered 2022-03-26: 2 mg via INTRA_ARTICULAR

## 2022-03-26 NOTE — Progress Notes (Signed)
He presents today chief complaint of painful callus plantar aspect of the right foot.  Objective: Vital signs stable he is alert and oriented x 3 pulses are palpable right beneath the head of the fifth metatarsal phalangeal joint is an area of fluctuance and bogginess it is tender on palpation with mild overlying erythema.  There is also mild reactive hyperkeratotic lesion there.  It is not open it is benign.  Assessment: Bursitis capsulitis fifth metatarsophalangeal joint plantarly.  Benign skin lesion.  Plan: Debrided benign skin lesion injected the bursa today 2 mg of dexamethasone and local anesthetic.  Follow-up with him in about 4 months

## 2022-03-31 ENCOUNTER — Telehealth: Payer: Self-pay

## 2022-03-31 NOTE — Telephone Encounter (Signed)
Alert received from [facility]:  Device alert for AF/AFL ongoing from 1/1 @ 22:56, controlled rates Hx of AF, burden 61%, Eliquis, Amiodarone, flutter ablation 02/04/22, route for increased burden  Outreach made to Pt.  He is unaware when he is having afib. He will send a remote transmission to determine if afib is ongoing.  Await transmission.

## 2022-04-01 NOTE — Telephone Encounter (Signed)
Outreach made to Pt.  Advised Dr. Myles Gip had reviewed recent report.  No action needed at this time.  Keep follow up as scheduled.  Pt indicates understanding.  All questions answered.

## 2022-04-07 DIAGNOSIS — F4322 Adjustment disorder with anxiety: Secondary | ICD-10-CM | POA: Diagnosis not present

## 2022-04-08 ENCOUNTER — Ambulatory Visit (INDEPENDENT_AMBULATORY_CARE_PROVIDER_SITE_OTHER): Payer: Medicare Other

## 2022-04-08 DIAGNOSIS — I428 Other cardiomyopathies: Secondary | ICD-10-CM | POA: Diagnosis not present

## 2022-04-08 LAB — CUP PACEART REMOTE DEVICE CHECK
Battery Remaining Longevity: 75 mo
Battery Remaining Percentage: 84 %
Battery Voltage: 3.02 V
Brady Statistic AP VP Percent: 1.8 %
Brady Statistic AP VS Percent: 47 %
Brady Statistic AS VP Percent: 5.7 %
Brady Statistic AS VS Percent: 45 %
Brady Statistic RA Percent Paced: 18 %
Brady Statistic RV Percent Paced: 17 %
Date Time Interrogation Session: 20240109020017
HighPow Impedance: 51 Ohm
HighPow Impedance: 51 Ohm
Implantable Lead Connection Status: 753985
Implantable Lead Connection Status: 753985
Implantable Lead Implant Date: 20101123
Implantable Lead Implant Date: 20101123
Implantable Lead Location: 753859
Implantable Lead Location: 753860
Implantable Lead Model: 7121
Implantable Pulse Generator Implant Date: 20201229
Lead Channel Impedance Value: 350 Ohm
Lead Channel Impedance Value: 440 Ohm
Lead Channel Pacing Threshold Amplitude: 0.75 V
Lead Channel Pacing Threshold Amplitude: 1.25 V
Lead Channel Pacing Threshold Pulse Width: 0.5 ms
Lead Channel Pacing Threshold Pulse Width: 0.6 ms
Lead Channel Sensing Intrinsic Amplitude: 5 mV
Lead Channel Sensing Intrinsic Amplitude: 8.9 mV
Lead Channel Setting Pacing Amplitude: 2 V
Lead Channel Setting Pacing Amplitude: 2.5 V
Lead Channel Setting Pacing Pulse Width: 0.6 ms
Lead Channel Setting Sensing Sensitivity: 0.5 mV
Pulse Gen Serial Number: 8907499

## 2022-04-15 DIAGNOSIS — L218 Other seborrheic dermatitis: Secondary | ICD-10-CM | POA: Diagnosis not present

## 2022-04-15 DIAGNOSIS — D225 Melanocytic nevi of trunk: Secondary | ICD-10-CM | POA: Diagnosis not present

## 2022-04-15 DIAGNOSIS — L821 Other seborrheic keratosis: Secondary | ICD-10-CM | POA: Diagnosis not present

## 2022-04-15 DIAGNOSIS — L738 Other specified follicular disorders: Secondary | ICD-10-CM | POA: Diagnosis not present

## 2022-04-15 DIAGNOSIS — L820 Inflamed seborrheic keratosis: Secondary | ICD-10-CM | POA: Diagnosis not present

## 2022-04-15 DIAGNOSIS — L570 Actinic keratosis: Secondary | ICD-10-CM | POA: Diagnosis not present

## 2022-04-15 DIAGNOSIS — Z85828 Personal history of other malignant neoplasm of skin: Secondary | ICD-10-CM | POA: Diagnosis not present

## 2022-04-15 DIAGNOSIS — L812 Freckles: Secondary | ICD-10-CM | POA: Diagnosis not present

## 2022-04-15 DIAGNOSIS — L308 Other specified dermatitis: Secondary | ICD-10-CM | POA: Diagnosis not present

## 2022-04-21 DIAGNOSIS — F4322 Adjustment disorder with anxiety: Secondary | ICD-10-CM | POA: Diagnosis not present

## 2022-04-30 NOTE — Progress Notes (Signed)
Remote pacemaker transmission.   

## 2022-05-05 DIAGNOSIS — F4322 Adjustment disorder with anxiety: Secondary | ICD-10-CM | POA: Diagnosis not present

## 2022-05-12 ENCOUNTER — Encounter: Payer: Self-pay | Admitting: Cardiovascular Disease

## 2022-05-12 ENCOUNTER — Ambulatory Visit: Payer: Medicare Other | Attending: Cardiovascular Disease | Admitting: Cardiovascular Disease

## 2022-05-12 VITALS — BP 126/74 | HR 99 | Ht 74.0 in | Wt 212.8 lb

## 2022-05-12 DIAGNOSIS — I484 Atypical atrial flutter: Secondary | ICD-10-CM | POA: Diagnosis not present

## 2022-05-12 DIAGNOSIS — I480 Paroxysmal atrial fibrillation: Secondary | ICD-10-CM | POA: Diagnosis not present

## 2022-05-12 NOTE — Patient Instructions (Signed)
Medication Instructions:  Your physician recommends that you continue on your current medications as directed. Please refer to the Current Medication list given to you today.  *If you need a refill on your cardiac medications before your next appointment, please call your pharmacy*  Lab Work: None  Testing/Procedures: Your physician has recommended that you have a Cardioversion (DCCV). Electrical Cardioversion uses a jolt of electricity to your heart either through paddles or wired patches attached to your chest. This is a controlled, usually prescheduled, procedure. Defibrillation is done under light anesthesia in the hospital, and you usually go home the day of the procedure. This is done to get your heart back into a normal rhythm. You are not awake for the procedure. Please see the instruction sheet given to you today.    Follow-Up: At [facility], you and your health needs are our priority.  As part of our continuing mission to provide you with exceptional heart care, we have created designated Provider Care Teams.  These Care Teams include your primary Cardiologist (physician) and Advanced Practice Providers (APPs -  Physician Assistants and Nurse Practitioners) who all work together to provide you with the care you need, when you need it.  Your next appointment:   4 weeks with [facility] 6 months with Dr. Myles Gip  Provider:   You may see Robert Quitter, MD or one of the following Advanced Practice Providers on your designated Care Team:   Tommye Standard, [place] [address]" [place], [place] Mamie Levers, NP  Other Instructions   Dear Robert Ochoa  You are scheduled for a Cardioversion on Thursday, February 29 with Dr. [name].  Please arrive at the [facility] (Main Entrance A) at [facility]: [address] [place], [place] [address] at 10:30 AM.   DIET:  Nothing to eat or drink after midnight except a sip of water with medications (see medication  instructions below)  MEDICATION INSTRUCTIONS: Continue taking your anticoagulant (blood thinner): Apixaban (Eliquis).  You will need to continue this after your procedure until you are told by your provider that it is safe to stop.    LABS:  Your labs will be done at the hospital prior to your procedure - you will need to arrive 1 and 1/2 hours prior to your procedure. This is already figured into your arrival time of 10:30am.  FYI:  For your safety, and to allow [place] to monitor your vital signs accurately during the surgery/procedure we request: If you have artificial nails, gel coating, SNS etc, please have those removed prior to your surgery/procedure. Not having the nail coverings /polish removed may result in cancellation or delay of your surgery/procedure.  You must have a responsible person to drive you home and stay in the waiting area during your procedure. Failure to do so could result in cancellation.  Bring your insurance cards.  *Special Note: Every effort is made to have your procedure done on time. Occasionally there are emergencies that occur at the hospital that may cause delays. Please be patient if a delay does occur.

## 2022-05-12 NOTE — Progress Notes (Signed)
Cardiology Office Note:    Date:  05/12/2022   ID:  Robert Ochoa, DOB July 22, 1945, MRN [id number]  PCP:  London Pepper, [place] [facility] Cardiologist:  None Electrophysiologist:  Melida Quitter, MD     Referring MD: London Pepper, MD   History of Present Illness:    Robert Ochoa is a 77 y.o. male with a hx of arrhythmogenic right ventricular cardiomyopathy, paroxysmal atrial fibrillation, ventricular tachycardia (see ECG 02/14/2009 with right sided VT), St. Jude biventricular ICD, sinus node dysfunction, referred by Dr. Caryl Comes to consider ablation.   He has a history of ARVC based on ECG and MRI criteria. He has had VT and now has an Abbott ICD for secondary prevention. The device generator was replaced in December, 2022. Amio was prescribed for VT, but he also has AF. At his last device visit with Dr. Caryl Comes, his AF burden was noted to have increased, so the amiodarone was also increased.  ECG from 03/27/2021 shows atrial flutter.  Past paceart reports show minimal atrial arrhythmia - usually about a 5% burden of short episodes. In the past few months, however, his burden has been increasing and is now about 32%  05/12/2021 He underwent ablation of fibrillation and typical atrial flutter in November, 2023. He felt fine afterwards. He did not notice any significant changte in symptoms. He had new-onset atypical flutter in January and has remained in it persistently since. He reports no change in symptoms or exertional capacity.   Past Medical History:  Diagnosis Date   Arrhythmogenic right ventricular dysplas [facility])    Arrhythmogenic right ventricular dysplasia (H[facility])    Hx of VT s/p dual-chamber St. Jude ICD. Cardiac MRI 2010 showed moderately dilated RV with hypokinesis, LV EF 47%. EF 50-55% 2010 but down to 45% 05/2011. Cath 01/2009 without significant CAD.   Atrial fibrillation or flutter    Paroxysmal. Started on Pradaxa 05/2011.   Cardiac defibrillator in  place    LV dysfunction    per echo 10/2011; EF 35%  // Echo 2/18: EF 40-45, diff HK, Gr 1 DD, mild dilated aortic root, mod LAE, mod RVE, mild reduced RVSF, mod RAE   Prostate cancer ([place])    Septic shock(785.52) 10-27-11 cause unknown   with associated respiratory/renal failure/PAF/bradycardia   Ventricular tachycardia ([place])    ICD discharges 05/2011 for this & AF-RVR - started on amiodarone then.   Wears glasses     Past Surgical History:  Procedure Laterality Date   APPENDECTOMY  2002   ATRIAL FIBRILLATION ABLATION N/A 02/04/2022   Procedure: ATRIAL FIBRILLATION ABLATION;  Surgeon: Melida Quitter, MD;  Location: [place] [facility];  Service: Cardiovascular;  Laterality: N/A;   CARDIAC DEFIBRILLATOR PLACEMENT  02/19/09    St,. Jude    ICD GENERATOR CHANGEOUT N/A 03/27/2021   Procedure: ICD GENERATOR CHANGEOUT;  Surgeon: Thompson Grayer, MD;  Location: [place] [facility];  Service: Cardiovascular;  Laterality: N/A;   IMPLANTABLE CARDIOVERTER DEFIBRILLATOR (ICD) GENERATOR CHANGE N/A 03/01/2013   Procedure: ICD GENERATOR CHANGE;  Surgeon: [name], MD;  Location: [facility] CATH LAB;  Service: Cardiovascular;  Laterality: N/A;   IMPLANTABLE CARDIOVERTER DEFIBRILLATOR GENERATOR CHANGE  03/01/13   for premature battery depletetion.  Now has a SJM Ellipse DR device placed by Dr Caryl Comes   PROSTATE BIOPSY  11/2019   [place] N/A 04/30/2020   Procedure: RADIOACTIVE SEED IMPLANT/BRACHYTHERAPY IMPLANT;  Surgeon: Ceasar Mons, MD;  Location: [facility];  Service:  Urology;  Laterality: N/A;   SPACE OAR INSTILLATION N/A 04/30/2020   Procedure: SPACE OAR INSTILLATION;  Surgeon: Ceasar Mons, MD;  Location: [facility];  Service: Urology;  Laterality: N/A;    Current Medications: Current Meds  Medication Sig   amiodarone (PACERONE) 200 MG tablet Take 1 tablet (200 mg total) by mouth daily. (Patient taking differently: Take 100 mg by  mouth daily.)   Calcium-Magnesium-Zinc (CAL-MAG-ZINC PO) Take 2 tablets by mouth daily.   ELIQUIS 5 MG TABS tablet TAKE 1 TABLET BY MOUTH TWICE DAILY   furosemide (LASIX) 20 MG tablet Take 1 tablet by mouth daily as needed for swelling.   lisinopril (ZESTRIL) 10 MG tablet TAKE 2 TABLETS BY MOUTH EVERY DAY   Omega-3 Fatty Acids (OMEGA 3 PO) Take 4,800 mg by mouth daily.   sildenafil (REVATIO) 20 MG tablet Take 20-100 mg by mouth daily as needed (ED).   tamsulosin (FLOMAX) 0.4 MG CAPS capsule Take 0.4 mg by mouth daily.   vitamin C (ASCORBIC ACID) 500 MG tablet Take 1,000 mg by mouth daily.     Allergies:   Patient has no known allergies.   Social History   Socioeconomic History   Marital status: Single    Spouse name: Not on file   Number of children: 1   Years of education: Not on file   Highest education level: Not on file  Occupational History    Employer: [employer]    Comment: Psychologist, sport and exercise  Tobacco Use   Smoking status: Never   Smokeless tobacco: Never   Tobacco comments:    Never smoke 03/04/22  Vaping Use   Vaping Use: Never used  Substance and Sexual Activity   Alcohol use: Yes    Alcohol/week: 1.0 standard drink of alcohol    Types: 1 Cans of beer per week    Comment: monthly   Drug use: No   Sexual activity: Yes    Birth control/protection: Coitus interruptus  Other Topics Concern   Not on file  Social History Narrative   Divorced w 1 child. Self employed.    Social Determinants of Health   Financial Resource Strain: Not on file  Food Insecurity: Not on file  Transportation Needs: Not on file  Physical Activity: Not on file  Stress: Not on file  Social Connections: Not on file     Family History: The patient's family history includes Allergies in his mother; Cancer in his paternal aunt; Heart attack in his father; Prostate cancer in his cousin. There is no history of Colon cancer, Pancreatic cancer, or Breast cancer.  ROS:   Please see  the history of present illness.    All other systems reviewed and are negative.  EKGs/Labs/Other Studies Reviewed:    I personally reviewed all prior ECGs available in [facility]. Notably: 03/27/21 - shows atrial flutter 02/14/2009 - VT with a RV origin  TTE 02/14/2021  1. Left ventricular ejection fraction, by estimation, is 55%. The left  ventricle has normal function. The left ventricle has no regional wall  motion abnormalities. There is mild concentric left ventricular  hypertrophy. Left ventricular diastolic  parameters are consistent with Grade I diastolic dysfunction (impaired  relaxation).   2. Right ventricular systolic function is normal. The right ventricular  size is normal. There is normal pulmonary artery systolic pressure.   3. The mitral valve is normal in structure. No evidence of mitral valve  regurgitation. No evidence of mitral stenosis.   4. The  aortic valve is tricuspid. Aortic valve regurgitation is not  visualized. Aortic valve sclerosis is present, with no evidence of aortic  valve stenosis.   CMR 02/18/2009 1)    No evidence of fatty infiltration of the RV.  However RV is        clearly abnormal with moderate dilatation and hypokinesis       2)    Mildly reduced LV function due to abnormal septal motion and  PVC's during the exam.  EF 47%  3)    No hyperenhancement of the LV  4)    Mild RA enlargement.      EKG:  Last EKG results: today -- sinus rhythm with conversion to atrial flutter. Left axis deviation.   Recent Labs: 12/26/2021: ALT 25; TSH 1.020 01/19/2022: BUN 20; Creatinine, Ser 1.33; Hemoglobin 16.2; Platelets 174; Potassium 4.9; Sodium 140      Physical Exam:    VS:  BP 126/74   Pulse 99   Ht 6' 2"$  (1.88 m)   Wt 212 lb 12.8 oz (96.5 kg)   SpO2 97%   BMI 27.32 kg/m     Wt Readings from Last 3 Encounters:  05/12/22 212 lb 12.8 oz (96.5 kg)  03/04/22 216 lb (98 kg)  02/04/22 209 lb (94.8 kg)     GEN: Well nourished, well developed  in no acute distress CARDIAC: RRR, no murmurs, rubs, gallops RESPIRATORY:  Normal work of breathing MUSCULOSKELETAL: no edema    ASSESSMENT & PLAN:    Paroxysmal atrial fibrillation and flutter: s/p ablation of AF and typical flutter 01/2022. Now with atypical flutter. Asymptomatic. Will order DC cardioversion  ARVC: continue previous dose of amiodarone VT: on amiodarone Amiodarone therapy Sinus bradycardia           Medication Adjustments/Labs and Tests Ordered: Current medicines are reviewed at length with the patient today.  Concerns regarding medicines are outlined above.  No orders of the defined types were placed in this encounter.  No orders of the defined types were placed in this encounter.    Signed, Melida Quitter, MD  05/12/2022 11:20 AM    [place]

## 2022-05-12 NOTE — H&P (View-Only) (Signed)
Cardiology Office Note:    Date:  05/12/2022   ID:  Robert Ochoa, DOB [birth year], MRN [id number]  PCP:  Robert Ochoa, Robert Ochoa Providers Cardiologist:  None Electrophysiologist:  Robert Quitter, MD     Referring MD: Robert Pepper, MD   History of Present Illness:    Robert Ochoa is a 77 y.o. male with a hx of arrhythmogenic right ventricular cardiomyopathy, paroxysmal atrial fibrillation, ventricular tachycardia (see ECG 02/14/2009 with right sided VT), St. Jude biventricular ICD, sinus node dysfunction, referred by Dr. Caryl Ochoa to consider ablation.   He has a history of ARVC based on ECG and MRI criteria. He has had VT and now has an Abbott ICD for secondary prevention. The device generator was replaced in December, 2022. Amio was prescribed for VT, but he also has AF. At his last device visit with Dr. Caryl Ochoa, his AF burden was noted to have increased, so the amiodarone was also increased.  ECG from 03/27/2021 shows atrial flutter.  Past paceart reports show minimal atrial arrhythmia - usually about a 5% burden of short episodes. In the past few months, however, his burden has been increasing and is now about 32%  05/12/2021 He underwent ablation of fibrillation and typical atrial flutter in November, 2023. He felt fine afterwards. He did not notice any significant changte in symptoms. He had new-onset atypical flutter in January and has remained in it persistently since. He reports no change in symptoms or exertional capacity.   Past Medical History:  Diagnosis Date   Arrhythmogenic right ventricular dysplas [facility])    Arrhythmogenic right ventricular dysplasia (HCC)    Hx of VT s/p dual-chamber St. Jude ICD. Cardiac MRI 2010 showed moderately dilated RV with hypokinesis, LV EF 47%. EF 50-55% 2010 but down to 45% 05/2011. Cath 01/2009 without significant CAD.   Atrial fibrillation or flutter    Paroxysmal. Started on Pradaxa 05/2011.   Cardiac defibrillator in  place    LV dysfunction    per echo 10/2011; EF 35%  // Echo 2/18: EF 40-45, diff HK, Gr 1 DD, mild dilated aortic root, mod LAE, mod RVE, mild reduced RVSF, mod RAE   Prostate cancer ([place])    Septic shock(785.52) 10-27-11 cause unknown   with associated respiratory/renal failure/PAF/bradycardia   Ventricular tachycardia ([place])    ICD discharges 05/2011 for this & AF-RVR - started on amiodarone then.   Wears glasses     Past Surgical History:  Procedure Laterality Date   APPENDECTOMY  2002   ATRIAL FIBRILLATION ABLATION N/A 02/04/2022   Procedure: ATRIAL FIBRILLATION ABLATION;  Surgeon: Robert Quitter, MD;  Location: [place] CV LAB;  Service: Cardiovascular;  Laterality: N/A;   CARDIAC DEFIBRILLATOR PLACEMENT  02/19/09    St,. Jude    ICD GENERATOR CHANGEOUT N/A 03/27/2021   Procedure: ICD GENERATOR CHANGEOUT;  Surgeon: Robert Grayer, MD;  Location: [place] CV LAB;  Service: Cardiovascular;  Laterality: N/A;   IMPLANTABLE CARDIOVERTER DEFIBRILLATOR (ICD) GENERATOR CHANGE N/A 03/01/2013   Procedure: ICD GENERATOR CHANGE;  Surgeon: [name], MD;  Location: [facility] CATH LAB;  Service: Cardiovascular;  Laterality: N/A;   IMPLANTABLE CARDIOVERTER DEFIBRILLATOR GENERATOR CHANGE  03/01/13   for premature battery depletetion.  Now has a SJM Ellipse DR device placed by Dr Robert Ochoa   PROSTATE BIOPSY  11/2019   [place] N/A 04/30/2020   Procedure: RADIOACTIVE SEED IMPLANT/BRACHYTHERAPY IMPLANT;  Surgeon: Ceasar Mons, MD;  Location: [facility];  Service:  Urology;  Laterality: N/A;   SPACE OAR INSTILLATION N/A 04/30/2020   Procedure: SPACE OAR INSTILLATION;  Surgeon: Ceasar Mons, MD;  Location: [facility];  Service: Urology;  Laterality: N/A;    Current Medications: Current Meds  Medication Sig   amiodarone (PACERONE) 200 MG tablet Take 1 tablet (200 mg total) by mouth daily. (Patient taking differently: Take 100 mg by  mouth daily.)   Calcium-Magnesium-Zinc (CAL-MAG-ZINC PO) Take 2 tablets by mouth daily.   ELIQUIS 5 MG TABS tablet TAKE 1 TABLET BY MOUTH TWICE DAILY   furosemide (LASIX) 20 MG tablet Take 1 tablet by mouth daily as needed for swelling.   lisinopril (ZESTRIL) 10 MG tablet TAKE 2 TABLETS BY MOUTH EVERY DAY   Omega-3 Fatty Acids (OMEGA 3 PO) Take 4,800 mg by mouth daily.   sildenafil (REVATIO) 20 MG tablet Take 20-100 mg by mouth daily as needed (ED).   tamsulosin (FLOMAX) 0.4 MG CAPS capsule Take 0.4 mg by mouth daily.   vitamin C (ASCORBIC ACID) 500 MG tablet Take 1,000 mg by mouth daily.     Allergies:   Patient has no known allergies.   Social History   Socioeconomic History   Marital status: Single    Spouse name: Not on file   Number of children: 1   Years of education: Not on file   Highest education level: Not on file  Occupational History    Employer: [employer]    Comment: Psychologist, sport and exercise  Tobacco Use   Smoking status: Never   Smokeless tobacco: Never   Tobacco comments:    Never smoke 03/04/22  Vaping Use   Vaping Use: Never used  Substance and Sexual Activity   Alcohol use: Yes    Alcohol/week: 1.0 standard drink of alcohol    Types: 1 Cans of beer per week    Comment: monthly   Drug use: No   Sexual activity: Yes    Birth control/protection: Coitus interruptus  Other Topics Concern   Not on file  Social History Narrative   Divorced w 1 child. Self employed.    Social Determinants of Health   Financial Resource Strain: Not on file  Food Insecurity: Not on file  Transportation Needs: Not on file  Physical Activity: Not on file  Stress: Not on file  Social Connections: Not on file     Family History: The patient's family history includes Allergies in his mother; Cancer in his paternal aunt; Heart attack in his father; Prostate cancer in his cousin. There is no history of Colon cancer, Pancreatic cancer, or Breast cancer.  ROS:   Please see  the history of present illness.    All other systems reviewed and are negative.  EKGs/Labs/Other Studies Reviewed:    I personally reviewed all prior ECGs available in [facility]. Notably: 03/27/21 - shows atrial flutter 02/14/2009 - VT with a RV origin  TTE 02/14/2021  1. Left ventricular ejection fraction, by estimation, is 55%. The left  ventricle has normal function. The left ventricle has no regional wall  motion abnormalities. There is mild concentric left ventricular  hypertrophy. Left ventricular diastolic  parameters are consistent with Grade I diastolic dysfunction (impaired  relaxation).   2. Right ventricular systolic function is normal. The right ventricular  size is normal. There is normal pulmonary artery systolic pressure.   3. The mitral valve is normal in structure. No evidence of mitral valve  regurgitation. No evidence of mitral stenosis.   4. The  aortic valve is tricuspid. Aortic valve regurgitation is not  visualized. Aortic valve sclerosis is present, with no evidence of aortic  valve stenosis.   CMR 02/18/2009 1)    No evidence of fatty infiltration of the RV.  However RV is        clearly abnormal with moderate dilatation and hypokinesis       2)    Mildly reduced LV function due to abnormal septal motion and  PVC's during the exam.  EF 47%  3)    No hyperenhancement of the LV  4)    Mild RA enlargement.      EKG:  Last EKG results: today -- sinus rhythm with conversion to atrial flutter. Left axis deviation.   Recent Labs: 12/26/2021: ALT 25; TSH 1.020 01/19/2022: BUN 20; Creatinine, Ser 1.33; Hemoglobin 16.2; Platelets 174; Potassium 4.9; Sodium 140      Physical Exam:    VS:  BP 126/74   Pulse 99   Ht '6\' 2"'$  (1.88 m)   Wt 212 lb 12.8 oz (96.5 kg)   SpO2 97%   BMI 27.32 kg/m     Wt Readings from Last 3 Encounters:  05/12/22 212 lb 12.8 oz (96.5 kg)  03/04/22 216 lb (98 kg)  02/04/22 209 lb (94.8 kg)     GEN: Well nourished, well developed  in no acute distress CARDIAC: RRR, no murmurs, rubs, gallops RESPIRATORY:  Normal work of breathing MUSCULOSKELETAL: no edema    ASSESSMENT & PLAN:    Paroxysmal atrial fibrillation and flutter: s/p ablation of AF and typical flutter 01/2022. Now with atypical flutter. Asymptomatic. Will order DC cardioversion  ARVC: continue previous dose of amiodarone VT: on amiodarone Amiodarone therapy Sinus bradycardia           Medication Adjustments/Labs and Tests Ordered: Current medicines are reviewed at length with the patient today.  Concerns regarding medicines are outlined above.  No orders of the defined types were placed in this encounter.  No orders of the defined types were placed in this encounter.    Signed, Robert Quitter, MD  05/12/2022 11:20 AM    [place]

## 2022-05-13 ENCOUNTER — Ambulatory Visit: Payer: Medicare Other | Admitting: Podiatry

## 2022-05-18 DIAGNOSIS — F4322 Adjustment disorder with anxiety: Secondary | ICD-10-CM | POA: Diagnosis not present

## 2022-05-28 ENCOUNTER — Ambulatory Visit (HOSPITAL_COMMUNITY)
Admission: RE | Admit: 2022-05-28 | Discharge: 2022-05-28 | Disposition: A | Discharge status: Home / Self Care | Payer: Medicare Other | Attending: Cardiovascular Disease | Admitting: Cardiovascular Disease

## 2022-05-28 ENCOUNTER — Encounter (HOSPITAL_COMMUNITY): Payer: Self-pay | Admitting: Cardiovascular Disease

## 2022-05-28 ENCOUNTER — Ambulatory Visit (HOSPITAL_BASED_OUTPATIENT_CLINIC_OR_DEPARTMENT_OTHER): Payer: Medicare Other | Admitting: Anesthesiology

## 2022-05-28 ENCOUNTER — Ambulatory Visit (HOSPITAL_COMMUNITY): Payer: Medicare Other | Admitting: Anesthesiology

## 2022-05-28 ENCOUNTER — Encounter (HOSPITAL_COMMUNITY)
Admission: RE | Disposition: A | Discharge status: Home / Self Care | Payer: Self-pay | Source: Home / Self Care | Attending: Cardiovascular Disease

## 2022-05-28 ENCOUNTER — Other Ambulatory Visit: Payer: Self-pay

## 2022-05-28 DIAGNOSIS — Z79899 Other long term (current) drug therapy: Secondary | ICD-10-CM | POA: Insufficient documentation

## 2022-05-28 DIAGNOSIS — I509 Heart failure, unspecified: Secondary | ICD-10-CM

## 2022-05-28 DIAGNOSIS — I4891 Unspecified atrial fibrillation: Secondary | ICD-10-CM | POA: Diagnosis not present

## 2022-05-28 DIAGNOSIS — I472 Ventricular tachycardia, unspecified: Secondary | ICD-10-CM | POA: Diagnosis not present

## 2022-05-28 DIAGNOSIS — R001 Bradycardia, unspecified: Secondary | ICD-10-CM | POA: Diagnosis not present

## 2022-05-28 DIAGNOSIS — I483 Typical atrial flutter: Secondary | ICD-10-CM | POA: Insufficient documentation

## 2022-05-28 DIAGNOSIS — I480 Paroxysmal atrial fibrillation: Secondary | ICD-10-CM | POA: Diagnosis not present

## 2022-05-28 DIAGNOSIS — I428 Other cardiomyopathies: Secondary | ICD-10-CM | POA: Diagnosis not present

## 2022-05-28 DIAGNOSIS — Z95810 Presence of automatic (implantable) cardiac defibrillator: Secondary | ICD-10-CM | POA: Diagnosis not present

## 2022-05-28 DIAGNOSIS — I4892 Unspecified atrial flutter: Secondary | ICD-10-CM

## 2022-05-28 HISTORY — PX: CARDIOVERSION: SHX1299

## 2022-05-28 LAB — POCT I-STAT, CHEM 8
BUN: 28 mg/dL — ABNORMAL HIGH (ref 8–23)
Calcium, Ion: 1.13 mmol/L — ABNORMAL LOW (ref 1.15–1.40)
Chloride: 105 mmol/L (ref 98–111)
Creatinine, Ser: 1.2 mg/dL (ref 0.61–1.24)
Glucose, Bld: 91 mg/dL (ref 70–99)
HCT: 52 % (ref 39.0–52.0)
Hemoglobin: 17.7 g/dL — ABNORMAL HIGH (ref 13.0–17.0)
Potassium: 4.7 mmol/L (ref 3.5–5.1)
Sodium: 140 mmol/L (ref 135–145)
TCO2: 25 mmol/L (ref 22–32)

## 2022-05-28 SURGERY — CARDIOVERSION
Anesthesia: General

## 2022-05-28 MED ORDER — SODIUM CHLORIDE 0.9 % IV SOLN: INTRAVENOUS | Status: DC

## 2022-05-28 MED ORDER — PROPOFOL 10 MG/ML IV BOLUS
INTRAVENOUS | Status: DC | PRN
  Administered 2022-05-28: 70 mg via INTRAVENOUS
  Administered 2022-05-28: 30 mg via INTRAVENOUS

## 2022-05-28 MED ORDER — ONDANSETRON HCL 4 MG/2ML IJ SOLN: 4.0000 mg | Freq: Four times a day (QID) | INTRAMUSCULAR | Status: DC | PRN

## 2022-05-28 MED ORDER — OXYCODONE HCL 5 MG PO TABS: 5.0000 mg | ORAL_TABLET | Freq: Once | ORAL | Status: DC | PRN

## 2022-05-28 MED ORDER — OXYCODONE HCL 5 MG/5ML PO SOLN: 5.0000 mg | Freq: Once | ORAL | Status: DC | PRN

## 2022-05-28 MED ORDER — LIDOCAINE HCL (CARDIAC) PF 100 MG/5ML IV SOSY
PREFILLED_SYRINGE | INTRAVENOUS | Status: DC | PRN
  Administered 2022-05-28: 60 mg via INTRAVENOUS

## 2022-05-28 NOTE — Discharge Instructions (Signed)

## 2022-05-28 NOTE — Interval H&P Note (Signed)
History and Physical Interval Note:  05/28/2022 12:40 PM  Robert Ochoa  has presented today for surgery, with the diagnosis of AFIB.  The various methods of treatment have been discussed with the patient and family. After consideration of risks, benefits and other options for treatment, the patient has consented to  Procedure(s): CARDIOVERSION (N/A) as a surgical intervention.  The patient's history has been reviewed, patient examined, no change in status, stable for surgery.  I have reviewed the patient's chart and labs.  Questions were answered to the patient's satisfaction.     [name]

## 2022-05-28 NOTE — Transfer of Care (Signed)
Immediate Anesthesia Transfer of Care Note  Patient: Robert Ochoa  Procedure(s) Performed: CARDIOVERSION  Patient Location: PACU and Endoscopy Unit  Anesthesia Type:General  Level of Consciousness: drowsy  Airway & Oxygen Therapy: Patient Spontanous Breathing and Patient connected to nasal cannula oxygen  Post-op Assessment: Report given to RN and Post -op Vital signs reviewed and stable  Post vital signs: Reviewed and stable  Last Vitals:  Vitals Value Taken Time  BP    Temp    Pulse    Resp    SpO2      Last Pain:  Vitals:   05/28/22 1142  TempSrc: Oral  PainSc: 0-No pain         Complications: No notable events documented.

## 2022-05-28 NOTE — CV Procedure (Signed)
[place]: Anesthesia: Propofol Rx Eliquis no missed doses  DCC x 1 150J  converted from atrial flutter to NSR with intermittent pacing St Jude device with inter atrial electrograms confirm NSR  No immediate neurologic sequelae  [name] MD [facility]

## 2022-05-28 NOTE — Anesthesia Postprocedure Evaluation (Signed)
Anesthesia Post Note  Patient: Robert Ochoa  Procedure(s) Performed: CARDIOVERSION     Patient location during evaluation: Endoscopy Anesthesia Type: General Level of consciousness: awake and alert Pain management: pain level controlled Vital Signs Assessment: post-procedure vital signs reviewed and stable Respiratory status: spontaneous breathing, nonlabored ventilation, respiratory function stable and patient connected to nasal cannula oxygen Cardiovascular status: blood pressure returned to baseline and stable Postop Assessment: no apparent nausea or vomiting Anesthetic complications: no   No notable events documented.  Last Vitals:  Vitals:   05/28/22 1244 05/28/22 1254  BP: 110/79 120/86  Pulse: 72 70  Resp: 19 14  Temp:    SpO2: 96% 96%    Last Pain:  Vitals:   05/28/22 1254  TempSrc:   PainSc: 0-No pain                 Suellen Durocher S

## 2022-05-28 NOTE — Anesthesia Preprocedure Evaluation (Signed)
Anesthesia Evaluation  Patient identified by MRN, date of birth, ID band Patient awake    Reviewed: Allergy & Precautions, H&P , NPO status , Patient's Chart, lab work & pertinent test results  Airway Mallampati: II   Neck ROM: full    Dental   Pulmonary neg pulmonary ROS   breath sounds clear to auscultation       Cardiovascular +CHF  + dysrhythmias Atrial Fibrillation + Cardiac Defibrillator  Rhythm:irregular Rate:Normal  Arrhythmogenic right ventricle   Neuro/Psych  PSYCHIATRIC DISORDERS  Depression       GI/Hepatic   Endo/Other    Renal/GU      Musculoskeletal   Abdominal   Peds  Hematology   Anesthesia Other Findings   Reproductive/Obstetrics                             Anesthesia Physical Anesthesia Plan  ASA: 3  Anesthesia Plan: General   Post-op Pain Management:    Induction: Intravenous  PONV Risk Score and Plan: 2 and Propofol infusion and Treatment may vary due to age or medical condition  Airway Management Planned: Mask  Additional Equipment:   Intra-op Plan:   Post-operative Plan:   Informed Consent: I have reviewed the patients History and Physical, chart, labs and discussed the procedure including the risks, benefits and alternatives for the proposed anesthesia with the patient or authorized representative who has indicated his/her understanding and acceptance.     Dental advisory given  Plan Discussed with: CRNA, Anesthesiologist and Surgeon  Anesthesia Plan Comments:        Anesthesia Quick Evaluation

## 2022-05-31 ENCOUNTER — Encounter (HOSPITAL_COMMUNITY): Payer: Self-pay | Admitting: Cardiovascular Disease

## 2022-06-01 DIAGNOSIS — F4322 Adjustment disorder with anxiety: Secondary | ICD-10-CM | POA: Diagnosis not present

## 2022-06-02 ENCOUNTER — Encounter: Payer: Self-pay | Admitting: Podiatry

## 2022-06-02 ENCOUNTER — Ambulatory Visit (INDEPENDENT_AMBULATORY_CARE_PROVIDER_SITE_OTHER): Payer: Medicare Other | Admitting: Podiatry

## 2022-06-02 ENCOUNTER — Ambulatory Visit (INDEPENDENT_AMBULATORY_CARE_PROVIDER_SITE_OTHER): Payer: Medicare Other

## 2022-06-02 DIAGNOSIS — Q667 Congenital pes cavus, unspecified foot: Secondary | ICD-10-CM | POA: Diagnosis not present

## 2022-06-02 DIAGNOSIS — M778 Other enthesopathies, not elsewhere classified: Secondary | ICD-10-CM

## 2022-06-02 DIAGNOSIS — G5793 Unspecified mononeuropathy of bilateral lower limbs: Secondary | ICD-10-CM

## 2022-06-02 DIAGNOSIS — M19071 Primary osteoarthritis, right ankle and foot: Secondary | ICD-10-CM

## 2022-06-02 NOTE — Progress Notes (Signed)
He presents today states that his dorsal and dorsal lateral aspect of his right foot he has noticed some aching and some cramping over the past few weeks states that has gotten some better.  Denies any trauma.  He also has a band tight feeling around his forefoot and in his toes most prominent in the evenings or at nighttime.  Objective: Vital signs are stable alert oriented x 3.  Pulses are palpable.  There is no erythema edema cellulitis drainage or odor.  Cavus foot deformity with reproducible pain on palpation of the second third fourth tarsometatarsal joints.  Radiographs taken today demonstrate osseously mature foot with demineralization of the midfoot to some degree.  Osteoarthritic changes with joint space narrowing subchondral sclerosis of the second and third TMT joints.  Assessment: Osteoarthritic changes and capsulitis dorsal aspect right foot.  Plan: Discussed topical therapies oral therapies.  Discussed injectable therapies.  He declined at this point.  We did discuss the use of nerve V and alpha lipoic acid.  He will follow-up with me for routine callus debridement and we may need to inject him at that time.

## 2022-06-16 ENCOUNTER — Ambulatory Visit (HOSPITAL_COMMUNITY)
Admission: RE | Admit: 2022-06-16 | Discharge: 2022-06-16 | Disposition: A | Discharge status: Home / Self Care | Payer: Medicare Other | Source: Ambulatory Visit | Attending: Physician Assistant | Admitting: Physician Assistant

## 2022-06-16 VITALS — BP 140/88 | HR 89 | Ht 74.0 in | Wt 217.6 lb

## 2022-06-16 DIAGNOSIS — I480 Paroxysmal atrial fibrillation: Secondary | ICD-10-CM | POA: Diagnosis not present

## 2022-06-16 DIAGNOSIS — E785 Hyperlipidemia, unspecified: Secondary | ICD-10-CM | POA: Diagnosis not present

## 2022-06-16 DIAGNOSIS — D6869 Other thrombophilia: Secondary | ICD-10-CM | POA: Diagnosis not present

## 2022-06-16 DIAGNOSIS — Z7901 Long term (current) use of anticoagulants: Secondary | ICD-10-CM | POA: Diagnosis not present

## 2022-06-16 DIAGNOSIS — F4322 Adjustment disorder with anxiety: Secondary | ICD-10-CM | POA: Diagnosis not present

## 2022-06-16 DIAGNOSIS — I484 Atypical atrial flutter: Secondary | ICD-10-CM

## 2022-06-16 MED ORDER — AMIODARONE HCL 200 MG PO TABS: ORAL_TABLET | ORAL | 1 refills | Status: DC

## 2022-06-16 NOTE — Patient Instructions (Addendum)
Increase amiodarone to 200mg  twice a day until day of cardioversion then reduce back to once a day   Cardioversion scheduled for: Monday. April 15th   - Arrive at the [facility] and go to admitting at 745am   - Do not eat or drink anything after midnight the night prior to your procedure.   - Take all your morning medication (except diabetic medications) with a sip of water prior to arrival.  - You will not be able to drive home after your procedure.    - Do NOT miss any doses of your blood thinner - if you should miss a dose please notify our office immediately.   - If you feel as if you go back into normal rhythm prior to scheduled cardioversion, please notify our office immediately.   If your procedure is canceled in the cardioversion suite you will be charged a cancellation fee.

## 2022-06-16 NOTE — Progress Notes (Signed)
Primary Care Physician: London Pepper, MD Primary Electrophysiologist: Dr Myles Gip Referring Physician: Dr Rayann Heman   Robert Ochoa is a 77 y.o. male with a history of ARVC, VT, ILD, HLD, LV dysfunction, and atrial fibrillation who presents for follow up in the [facility]. Patient has been maintained on amiodarone. Patient is on Eliquis for a CHADS2VASC score of 3. The device clinic received an alert for an ongoing afib episode starting 07/21/20. Repeat device interrogation showed he was back in R on 4/25. He was unaware of his arrhythmia, possibly had some mild fatigue but couldn't be sure. Unfortunately, his afib burden continued to increase on his ICD. He was seen by Dr Myles Gip and underwent afib and flutter ablation on 02/04/22.  Patient was seen by Dr Myles Gip on 05/12/22 and was in persistent atypical atrial flutter. Patient is s/p DCCV on 05/28/22.  On follow up today, patient is unfortunately back in atrial flutter. He is unaware of his arrhythmia. Device interrogated today and it appears he was back out of rhythm the day after his DCCV. No bleeding issues on anticoagulation.   Today, he denies symptoms of palpitations, chest pain, shortness of breath, orthopnea, PND, lower extremity edema, dizziness, presyncope, syncope, snoring, daytime somnolence, bleeding, or neurologic sequela. The patient is tolerating medications without difficulties and is otherwise without complaint today.    Atrial Fibrillation Risk Factors:  he does not have symptoms or diagnosis of sleep apnea. he does not have a history of rheumatic fever.   he has a BMI of Body mass index is 27.94 kg/m.[name] Filed Weights   06/16/22 1328  Weight: 98.7 kg    Family History  Problem Relation Age of Onset   Heart attack Father    Allergies Mother    Cancer Paternal Aunt        type unknown   Prostate cancer Cousin    Colon cancer Neg Hx    Pancreatic cancer Neg Hx    Breast cancer Neg Hx       Atrial Fibrillation Management history:  Previous antiarrhythmic drugs: amiodarone  Previous cardioversions: 05/28/22 Previous ablations: 02/04/22 CHADS2VASC score: 3 Anticoagulation history: Eliquis   Past Medical History:  Diagnosis Date   Arrhythmogenic right ventricular dysplas ([place])    Arrhythmogenic right ventricular dysplasia ([facility])    Hx of VT s/p dual-chamber St. Jude ICD. Cardiac MRI 2010 showed moderately dilated RV with hypokinesis, LV EF 47%. EF 50-55% 2010 but down to 45% 05/2011. Cath 01/2009 without significant CAD.   Atrial fibrillation or flutter    Paroxysmal. Started on Pradaxa 05/2011.   Cardiac defibrillator in place    LV dysfunction    per echo 10/2011; EF 35%  // Echo 2/18: EF 40-45, diff HK, Gr 1 DD, mild dilated aortic root, mod LAE, mod RVE, mild reduced RVSF, mod RAE   Prostate cancer ([place])    Septic shock(785.52) 10-27-11 cause unknown   with associated respiratory/renal failure/PAF/bradycardia   Ventricular tachycardia ([place])    ICD discharges 05/2011 for this & AF-RVR - started on amiodarone then.   Wears glasses    Past Surgical History:  Procedure Laterality Date   APPENDECTOMY  2002   ATRIAL FIBRILLATION ABLATION N/A 02/04/2022   Procedure: ATRIAL FIBRILLATION ABLATION;  Surgeon: Melida Quitter, MD;  Location: [place];  Service: Cardiovascular;  Laterality: N/A;   CARDIAC DEFIBRILLATOR PLACEMENT  02/19/09    St,. Jude    CARDIOVERSION N/A 05/28/2022   Procedure: CARDIOVERSION;  Surgeon: Josue Hector, MD;  Location: [facility] ENDOSCOPY;  Service: Cardiovascular;  Laterality: N/A;   ICD GENERATOR CHANGEOUT N/A 03/27/2021   Procedure: ICD GENERATOR CHANGEOUT;  Surgeon: Thompson Grayer, MD;  Location: [place] CV [facility];  Service: Cardiovascular;  Laterality: N/A;   IMPLANTABLE CARDIOVERTER DEFIBRILLATOR (ICD) GENERATOR CHANGE N/A 03/01/2013   Procedure: ICD GENERATOR CHANGE;  Surgeon: [name], MD;  Location: [facility] CATH LAB;  Service:  Cardiovascular;  Laterality: N/A;   IMPLANTABLE CARDIOVERTER DEFIBRILLATOR GENERATOR CHANGE  03/01/13   for premature battery depletetion.  Now has a SJM Ellipse DR device placed by Dr Caryl Comes   PROSTATE BIOPSY  11/2019   [place] N/A 04/30/2020   Procedure: RADIOACTIVE SEED IMPLANT/BRACHYTHERAPY IMPLANT;  Surgeon: Ceasar Mons, MD;  Location: [facility];  Service: Urology;  Laterality: N/A;   SPACE OAR INSTILLATION N/A 04/30/2020   Procedure: SPACE OAR INSTILLATION;  Surgeon: Ceasar Mons, MD;  Location: [facility];  Service: Urology;  Laterality: N/A;    Current Outpatient Medications  Medication Sig Dispense Refill   Calcium-Magnesium-Zinc (CAL-MAG-ZINC PO) Take 2 tablets by mouth daily.     Carboxymethylcellul-Glycerin (LUBRICATING EYE DROPS OP) Place 1 drop into both eyes daily as needed (dry eyes).     ELIQUIS 5 MG TABS tablet TAKE 1 TABLET BY MOUTH TWICE DAILY 60 tablet 5   furosemide (LASIX) 20 MG tablet Take 1 tablet by mouth daily as needed for swelling. 30 tablet 8   lisinopril (ZESTRIL) 10 MG tablet TAKE 2 TABLETS BY MOUTH EVERY DAY (Patient taking differently: Take 20 mg by mouth every evening.) 180 tablet 3   Omega-3 Fatty Acids (OMEGA 3 PO) Take 4,800 mg by mouth daily.     sildenafil (REVATIO) 20 MG tablet Take 20-100 mg by mouth daily as needed (ED).     tamsulosin (FLOMAX) 0.4 MG CAPS capsule Take 0.4 mg by mouth daily.     triamcinolone cream (KENALOG) 0.1 % Apply 1 Application topically 2 (two) times daily as needed (itchy scalp).     vitamin C (ASCORBIC ACID) 500 MG tablet Take 1,000 mg by mouth daily.     amiodarone (PACERONE) 200 MG tablet Take 1 tablet (200 mg total) by mouth 2 (two) times daily for 30 days, THEN 1 tablet (200 mg total) daily. 120 tablet 1   No current facility-administered medications for this encounter.    No Known Allergies  Social History   Socioeconomic History   Marital  status: Single    Spouse name: Not on file   Number of children: 1   Years of education: Not on file   Highest education level: Not on file  Occupational History    Employer: [employer]    Comment: Psychologist, sport and exercise  Tobacco Use   Smoking status: Never   Smokeless tobacco: Never   Tobacco comments:    Never smoke 03/04/22  Vaping Use   Vaping Use: Never used  Substance and Sexual Activity   Alcohol use: Yes    Alcohol/week: 1.0 standard drink of alcohol    Types: 1 Cans of beer per week    Comment: monthly   Drug use: No   Sexual activity: Yes    Birth control/protection: Coitus interruptus  Other Topics Concern   Not on file  Social History Narrative   Divorced w 1 child. Self employed.    Social Determinants of Health   Financial Resource Strain: Not on file  Food Insecurity: Not  on file  Transportation Needs: Not on file  Physical Activity: Not on file  Stress: Not on file  Social Connections: Not on file  Intimate Partner Violence: Not on file     ROS- All systems are reviewed and negative except as per the HPI above.  Physical Exam: Vitals:   06/16/22 1328  BP: (!) 140/88  Pulse: 89  Weight: 98.7 kg  Height: 6\' 2"  (1.88 m)    GEN- The patient is a well appearing elderly male, alert and oriented x 3 today.   HEENT-head normocephalic, atraumatic, sclera clear, conjunctiva pink, hearing intact, trachea midline. Lungs- Clear to ausculation bilaterally, normal work of breathing Heart- irregular rate and rhythm, no murmurs, rubs or gallops  GI- soft, NT, ND, + BS Extremities- no clubbing, cyanosis, or edema MS- no significant deformity or atrophy Skin- no rash or lesion Psych- euthymic mood, full affect Neuro- strength and sensation are intact   Wt Readings from Last 3 Encounters:  06/16/22 98.7 kg  05/28/22 95.7 kg  05/12/22 96.5 kg    EKG today demonstrates  Atrial flutter with variable block Vent. rate 89 BPM PR interval * ms QRS  duration 110 ms QT/QTcB 364/442 ms  Echo 02/14/21 demonstrated   1. Left ventricular ejection fraction, by estimation, is 55%. The left  ventricle has normal function. The left ventricle has no regional wall  motion abnormalities. There is mild concentric left ventricular  hypertrophy. Left ventricular diastolic parameters are consistent with Grade I diastolic dysfunction (impaired relaxation).   2. Right ventricular systolic function is normal. The right ventricular  size is normal. There is normal pulmonary artery systolic pressure.   3. The mitral valve is normal in structure. No evidence of mitral valve  regurgitation. No evidence of mitral stenosis.   4. The aortic valve is tricuspid. Aortic valve regurgitation is not  visualized. Aortic valve sclerosis is present, with no evidence of aortic  valve stenosis.   Comparison(s): A prior study was performed on 06/10/20. No significant  change from prior study.   Epic records are reviewed at length today  CHA2DS2-VASc Score = 3  The patient's score is based upon: CHF History: 1 HTN History: 0 Diabetes History: 0 Stroke History: 0 Vascular Disease History: 0 Age Score: 2 Gender Score: 0       ASSESSMENT AND PLAN: 1. Paroxysmal Atrial Fibrillation/persistent atrial flutter The patient's CHA2DS2-VASc score is 3, indicating a 3.2% annual risk of stroke.   S/p afib and flutter ablation 02/04/22 S/p DCCV on 05/28/22 with quick return of atrial flutter. We discussed rate vs rhythm control today. Given his atypical flutter, unclear if additional ablation would be beneficial. After discussing the risks and benefits, will plan to reload amiodarone and try DCCV one more time. Will increase amiodarone to 200 mg BID and schedule DCCV. If he quickly returns to flutter again, he may opt for a rate control strategy given his paucity of symptoms.  Continue Eliquis 5 mg BID   2. Secondary Hypercoagulable State (ICD10:  D68.69) The patient is at  significant risk for stroke/thromboembolism based upon his CHA2DS2-VASc Score of 3.  Continue Apixaban (Eliquis).   3. ARVC S/p ICD, followed by Dr Caryl Comes and the device clinic.  4. VT Continue amiodarone.    Follow up in the AF clinic post DCCV.    [facility] [address] [place], [place] [address] [phone] 06/16/2022 4:29 PM

## 2022-06-30 DIAGNOSIS — F4322 Adjustment disorder with anxiety: Secondary | ICD-10-CM | POA: Diagnosis not present

## 2022-07-03 ENCOUNTER — Other Ambulatory Visit (HOSPITAL_COMMUNITY): Payer: Medicare Other | Admitting: Physician Assistant

## 2022-07-06 ENCOUNTER — Other Ambulatory Visit: Payer: Self-pay | Admitting: Student

## 2022-07-06 ENCOUNTER — Ambulatory Visit (HOSPITAL_COMMUNITY)
Admission: RE | Admit: 2022-07-06 | Discharge: 2022-07-06 | Disposition: A | Discharge status: Home / Self Care | Payer: Medicare Other | Source: Ambulatory Visit | Attending: Physician Assistant | Admitting: Physician Assistant

## 2022-07-06 DIAGNOSIS — I4891 Unspecified atrial fibrillation: Secondary | ICD-10-CM | POA: Insufficient documentation

## 2022-07-06 DIAGNOSIS — I480 Paroxysmal atrial fibrillation: Secondary | ICD-10-CM

## 2022-07-06 LAB — BASIC METABOLIC PANEL
Anion gap: 8 (ref 5–15)
BUN: 25 mg/dL — ABNORMAL HIGH (ref 8–23)
CO2: 23 mmol/L (ref 22–32)
Calcium: 8.7 mg/dL — ABNORMAL LOW (ref 8.9–10.3)
Chloride: 105 mmol/L (ref 98–111)
Creatinine, Ser: 1.28 mg/dL — ABNORMAL HIGH (ref 0.61–1.24)
GFR, Estimated: 58 mL/min — ABNORMAL LOW (ref >60)
Glucose, Bld: 91 mg/dL (ref 70–99)
Potassium: 4.3 mmol/L (ref 3.5–5.1)
Sodium: 136 mmol/L (ref 135–145)

## 2022-07-06 LAB — CBC
HCT: 51.6 % (ref 39.0–52.0)
Hemoglobin: 16.5 g/dL (ref 13.0–17.0)
MCH: 29.2 pg (ref 26.0–34.0)
MCHC: 32 g/dL (ref 30.0–36.0)
MCV: 91.3 fL (ref 80.0–100.0)
Platelets: 171 10*3/uL (ref 150–400)
RBC: 5.65 MIL/uL (ref 4.22–5.81)
RDW: 13.8 % (ref 11.5–15.5)
WBC: 5.7 10*3/uL (ref 4.0–10.5)
nRBC: 0 % (ref 0.0–0.2)

## 2022-07-06 NOTE — Telephone Encounter (Signed)
Prescription refill request for Eliquis received. Indication: Afib  Last office visit: 06/16/22 Robert Ochoa) Scr: 1.33 (01/19/22)  Age: 77 Weight: 98.7kg  Appropriate dose. Refill sent.

## 2022-07-08 ENCOUNTER — Ambulatory Visit (INDEPENDENT_AMBULATORY_CARE_PROVIDER_SITE_OTHER): Payer: Medicare Other

## 2022-07-08 DIAGNOSIS — I420 Dilated cardiomyopathy: Secondary | ICD-10-CM

## 2022-07-08 LAB — CUP PACEART REMOTE DEVICE CHECK
Battery Remaining Longevity: 74 mo
Battery Remaining Percentage: 81 %
Battery Voltage: 3.01 V
Brady Statistic AP VP Percent: 1.6 %
Brady Statistic AP VS Percent: 51 %
Brady Statistic AS VP Percent: 1.1 %
Brady Statistic AS VS Percent: 46 %
Brady Statistic RA Percent Paced: 4.8 %
Brady Statistic RV Percent Paced: 21 %
Date Time Interrogation Session: 20240410020018
HighPow Impedance: 52 Ohm
HighPow Impedance: 52 Ohm
Implantable Lead Connection Status: 753985
Implantable Lead Connection Status: 753985
Implantable Lead Implant Date: 20101123
Implantable Lead Implant Date: 20101123
Implantable Lead Location: 753859
Implantable Lead Location: 753860
Implantable Lead Model: 7121
Implantable Pulse Generator Implant Date: 20201229
Lead Channel Impedance Value: 380 Ohm
Lead Channel Impedance Value: 450 Ohm
Lead Channel Pacing Threshold Amplitude: 0.75 V
Lead Channel Pacing Threshold Amplitude: 1.25 V
Lead Channel Pacing Threshold Pulse Width: 0.5 ms
Lead Channel Pacing Threshold Pulse Width: 0.6 ms
Lead Channel Sensing Intrinsic Amplitude: 2.6 mV
Lead Channel Sensing Intrinsic Amplitude: 9.4 mV
Lead Channel Setting Pacing Amplitude: 2 V
Lead Channel Setting Pacing Amplitude: 2.5 V
Lead Channel Setting Pacing Pulse Width: 0.6 ms
Lead Channel Setting Sensing Sensitivity: 0.5 mV
Pulse Gen Serial Number: 8907499

## 2022-07-10 NOTE — Pre-Procedure Instructions (Signed)
Spoke with patient regarding procedure instructions for Monday 4/15 cardioversion.   Nothing to eat or drink after midnight. You will need a responsible adult to drive you home after procedure. Eliquis- he takes twice a day, he hasn't missed any doses, he will take his dose on Monday morning.

## 2022-07-13 ENCOUNTER — Ambulatory Visit (HOSPITAL_COMMUNITY)
Admission: RE | Admit: 2022-07-13 | Discharge: 2022-07-13 | Disposition: A | Discharge status: Home / Self Care | Payer: Medicare Other | Source: Ambulatory Visit | Attending: Internal Medicine | Admitting: Internal Medicine

## 2022-07-13 ENCOUNTER — Other Ambulatory Visit: Payer: Self-pay

## 2022-07-13 ENCOUNTER — Ambulatory Visit (HOSPITAL_COMMUNITY): Payer: Medicare Other | Admitting: Certified Registered"

## 2022-07-13 ENCOUNTER — Encounter (HOSPITAL_COMMUNITY)
Admission: RE | Disposition: A | Discharge status: Home / Self Care | Payer: Self-pay | Source: Ambulatory Visit | Attending: Internal Medicine

## 2022-07-13 ENCOUNTER — Encounter (HOSPITAL_COMMUNITY): Payer: Self-pay | Admitting: Internal Medicine

## 2022-07-13 DIAGNOSIS — Z539 Procedure and treatment not carried out, unspecified reason: Secondary | ICD-10-CM | POA: Diagnosis not present

## 2022-07-13 DIAGNOSIS — I4891 Unspecified atrial fibrillation: Secondary | ICD-10-CM | POA: Insufficient documentation

## 2022-07-13 SURGERY — INVASIVE LAB ABORTED CASE
Anesthesia: General

## 2022-07-13 SURGICAL SUPPLY — 1 items: ELECT DEFIB PAD ADLT CADENCE (PAD) ×2 IMPLANT

## 2022-07-13 NOTE — Progress Notes (Signed)
ST jude was able to pace pt. Out of Rhythm. Cardioversion was not needed.

## 2022-07-13 NOTE — H&P (Signed)
Patient presented for cardioversion  Device rep (B. Small) interrogated device.   Paced patient back into SR When I saw the pt he was in SR with atrial flutter. Device reprogrammed to pace at 70 bpm   Maintained SR at this rate with no atrial ectopy Patient discharged home .  Dietrich Pates MD

## 2022-07-13 NOTE — Anesthesia Preprocedure Evaluation (Signed)
Anesthesia Evaluation  Patient identified by MRN, date of birth, ID band Patient awake    Reviewed: Allergy & Precautions, H&P , NPO status , Patient's Chart, lab work & pertinent test results  Airway Mallampati: II  TM Distance: >3 FB Neck ROM: full    Dental no notable dental hx.    Pulmonary neg pulmonary ROS   Pulmonary exam normal breath sounds clear to auscultation       Cardiovascular +CHF  Normal cardiovascular exam+ dysrhythmias Atrial Fibrillation + Cardiac Defibrillator  Rhythm:irregular Rate:Normal  Arrhythmogenic right ventricle   Neuro/Psych  PSYCHIATRIC DISORDERS  Depression       GI/Hepatic   Endo/Other    Renal/GU      Musculoskeletal   Abdominal   Peds  Hematology   Anesthesia Other Findings   Reproductive/Obstetrics                             Anesthesia Physical Anesthesia Plan  ASA: 3  Anesthesia Plan: General   Post-op Pain Management:    Induction: Intravenous  PONV Risk Score and Plan: 2 and Propofol infusion and Treatment may vary due to age or medical condition  Airway Management Planned: Mask  Additional Equipment:   Intra-op Plan:   Post-operative Plan:   Informed Consent: I have reviewed the patients History and Physical, chart, labs and discussed the procedure including the risks, benefits and alternatives for the proposed anesthesia with the patient or authorized representative who has indicated his/her understanding and acceptance.     Dental advisory given  Plan Discussed with: CRNA, Anesthesiologist and Surgeon  Anesthesia Plan Comments:        Anesthesia Quick Evaluation

## 2022-07-15 ENCOUNTER — Telehealth: Payer: Self-pay

## 2022-07-15 NOTE — Telephone Encounter (Signed)
Following alert received from [employer] received for Device alert for exceed AT/AF ongoing from 4/17 @ 00:40,  overall controlled rates. Burden 69%, Eliquis, Amiodarone per prior report. DCCV scheduled 4/15 pt. was in SR at this time. Route to triage for return to AF/AFL.

## 2022-07-21 DIAGNOSIS — F4322 Adjustment disorder with anxiety: Secondary | ICD-10-CM | POA: Diagnosis not present

## 2022-07-22 ENCOUNTER — Encounter (HOSPITAL_COMMUNITY): Payer: Self-pay | Admitting: Physician Assistant

## 2022-07-22 ENCOUNTER — Ambulatory Visit (HOSPITAL_COMMUNITY)
Admission: RE | Admit: 2022-07-22 | Discharge: 2022-07-22 | Disposition: A | Discharge status: Home / Self Care | Payer: Medicare Other | Source: Ambulatory Visit | Attending: Physician Assistant | Admitting: Physician Assistant

## 2022-07-22 VITALS — BP 118/86 | HR 71 | Ht 74.0 in | Wt 219.8 lb

## 2022-07-22 DIAGNOSIS — Z79899 Other long term (current) drug therapy: Secondary | ICD-10-CM | POA: Insufficient documentation

## 2022-07-22 DIAGNOSIS — I4819 Other persistent atrial fibrillation: Secondary | ICD-10-CM | POA: Insufficient documentation

## 2022-07-22 DIAGNOSIS — I484 Atypical atrial flutter: Secondary | ICD-10-CM | POA: Diagnosis not present

## 2022-07-22 DIAGNOSIS — Z7901 Long term (current) use of anticoagulants: Secondary | ICD-10-CM | POA: Diagnosis not present

## 2022-07-22 DIAGNOSIS — I472 Ventricular tachycardia, unspecified: Secondary | ICD-10-CM | POA: Diagnosis not present

## 2022-07-22 DIAGNOSIS — J849 Interstitial pulmonary disease, unspecified: Secondary | ICD-10-CM | POA: Insufficient documentation

## 2022-07-22 DIAGNOSIS — E785 Hyperlipidemia, unspecified: Secondary | ICD-10-CM | POA: Insufficient documentation

## 2022-07-22 DIAGNOSIS — D6869 Other thrombophilia: Secondary | ICD-10-CM | POA: Diagnosis not present

## 2022-07-22 DIAGNOSIS — Z5181 Encounter for therapeutic drug level monitoring: Secondary | ICD-10-CM

## 2022-07-22 MED ORDER — AMIODARONE HCL 200 MG PO TABS: 200.0000 mg | ORAL_TABLET | Freq: Every day | ORAL | 1 refills | Status: DC

## 2022-07-22 NOTE — Progress Notes (Signed)
Primary Care Physician: Farris Has, MD Primary Electrophysiologist: Dr Nelly Laurence Referring Physician: Dr Johney Frame   Robert Ochoa is a 77 y.o. male with a history of ARVC, VT, ILD, HLD, LV dysfunction, and atrial fibrillation who presents for follow up in the [facility]. Patient has been maintained on amiodarone. Patient is on Eliquis for a CHADS2VASC score of 3. The device clinic received an alert for an ongoing afib episode starting 07/21/20. Repeat device interrogation showed he was back in R on 4/25. He was unaware of his arrhythmia, possibly had some mild fatigue but couldn't be sure. Unfortunately, his afib burden continued to increase on his ICD. He was seen by Dr Nelly Laurence and underwent afib and flutter ablation on 02/04/22.  Patient was seen by Dr Nelly Laurence on 05/12/22 and was in persistent atypical atrial flutter. Patient is s/p DCCV on 05/28/22 with quick return of atrial flutter, amiodarone increased and set up for another DCCV. He was in SR at the time of his DCCV and so the procedure was cancelled. However, he was back in atrial flutter again two days later.   On follow up today, patient remains in atrial flutter. He is unaware of his arrhythmia and feels well today. No bleeding issues on anticoagulation.   Today, he denies symptoms of palpitations, chest pain, shortness of breath, orthopnea, PND, lower extremity edema, dizziness, presyncope, syncope, snoring, daytime somnolence, bleeding, or neurologic sequela. The patient is tolerating medications without difficulties and is otherwise without complaint today.    Atrial Fibrillation Risk Factors:  he does not have symptoms or diagnosis of sleep apnea. he does not have a history of rheumatic fever.   he has a BMI of Body mass index is 28.22 kg/m.[name] Filed Weights   07/22/22 1417  Weight: 99.7 kg    Family History  Problem Relation Age of Onset   Heart attack Father    Allergies Mother    Cancer  Paternal Aunt        type unknown   Prostate cancer Cousin    Colon cancer Neg Hx    Pancreatic cancer Neg Hx    Breast cancer Neg Hx      Atrial Fibrillation Management history:  Previous antiarrhythmic drugs: amiodarone  Previous cardioversions: 05/28/22 Previous ablations: 02/04/22 CHADS2VASC score: 3 Anticoagulation history: Eliquis   Past Medical History:  Diagnosis Date   Arrhythmogenic right ventricular dysplas    Arrhythmogenic right ventricular dysplasia    Hx of VT s/p dual-chamber St. Jude ICD. Cardiac MRI 2010 showed moderately dilated RV with hypokinesis, LV EF 47%. EF 50-55% 2010 but down to 45% 05/2011. Cath 01/2009 without significant CAD.   Atrial fibrillation or flutter    Paroxysmal. Started on Pradaxa 05/2011.   Cardiac defibrillator in place    LV dysfunction    per echo 10/2011; EF 35%  // Echo 2/18: EF 40-45, diff HK, Gr 1 DD, mild dilated aortic root, mod LAE, mod RVE, mild reduced RVSF, mod RAE   Prostate cancer    Septic shock(785.52) 10-27-11 cause unknown   with associated respiratory/renal failure/PAF/bradycardia   Ventricular tachycardia    ICD discharges 05/2011 for this & AF-RVR - started on amiodarone then.   Wears glasses    Past Surgical History:  Procedure Laterality Date   APPENDECTOMY  2002   ATRIAL FIBRILLATION ABLATION N/A 02/04/2022   Procedure: ATRIAL FIBRILLATION ABLATION;  Surgeon: Maurice Small, MD;  Location: [facility] INVASIVE CV LAB;  Service: Cardiovascular;  Laterality: N/A;  CARDIAC DEFIBRILLATOR PLACEMENT  02/19/09    St,. Jude    CARDIOVERSION N/A 05/28/2022   Procedure: CARDIOVERSION;  Surgeon: Wendall Stade, MD;  Location: [facility] ENDOSCOPY;  Service: Cardiovascular;  Laterality: N/A;   ICD GENERATOR CHANGEOUT N/A 03/27/2021   Procedure: ICD GENERATOR CHANGEOUT;  Surgeon: Hillis Range, MD;  Location: [facility] INVASIVE CV LAB;  Service: Cardiovascular;  Laterality: N/A;   IMPLANTABLE CARDIOVERTER DEFIBRILLATOR (ICD) GENERATOR CHANGE  N/A 03/01/2013   Procedure: ICD GENERATOR CHANGE;  Surgeon: Duke Salvia, MD;  Location: [facility] CATH LAB;  Service: Cardiovascular;  Laterality: N/A;   IMPLANTABLE CARDIOVERTER DEFIBRILLATOR GENERATOR CHANGE  03/01/13   for premature battery depletetion.  Now has a SJM Ellipse DR device placed by Dr Graciela Husbands   PROSTATE BIOPSY  11/2019   RADIOACTIVE SEED IMPLANT N/A 04/30/2020   Procedure: RADIOACTIVE SEED IMPLANT/BRACHYTHERAPY IMPLANT;  Surgeon: Rene Paci, MD;  Location: [facility];  Service: Urology;  Laterality: N/A;   SPACE OAR INSTILLATION N/A 04/30/2020   Procedure: SPACE OAR INSTILLATION;  Surgeon: Rene Paci, MD;  Location: [facility];  Service: Urology;  Laterality: N/A;    Current Outpatient Medications  Medication Sig Dispense Refill   amiodarone (PACERONE) 200 MG tablet Take 1 tablet (200 mg total) by mouth 2 (two) times daily for 30 days, THEN 1 tablet (200 mg total) daily. 120 tablet 1   Calcium-Magnesium-Zinc (CAL-MAG-ZINC PO) Take 2 tablets by mouth daily.     Carboxymethylcellul-Glycerin (LUBRICATING EYE DROPS OP) Place 1 drop into both eyes daily as needed (dry eyes).     ELIQUIS 5 MG TABS tablet TAKE 1 TABLET BY MOUTH TWICE DAILY 60 tablet 5   furosemide (LASIX) 20 MG tablet Take 1 tablet by mouth daily as needed for swelling. 30 tablet 8   lisinopril (ZESTRIL) 10 MG tablet TAKE 2 TABLETS BY MOUTH EVERY DAY 180 tablet 3   Omega-3 Fatty Acids (OMEGA 3 PO) Take 4,800 mg by mouth daily.     sildenafil (REVATIO) 20 MG tablet Take 20-100 mg by mouth daily as needed (ED).     tamsulosin (FLOMAX) 0.4 MG CAPS capsule Take 0.4 mg by mouth daily.     triamcinolone cream (KENALOG) 0.1 % Apply 1 Application topically 2 (two) times daily as needed (itchy scalp).     vitamin C (ASCORBIC ACID) 500 MG tablet Take 1,000 mg by mouth daily.     No current facility-administered medications for this encounter.    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 Self employed.    Social Determinants of Health  Financial Resource Strain: Not on file  Food Insecurity: Not on file  Transportation Needs: Not on file  Physical Activity: Not on file  Stress: Not on file  Social Connections: Not on file  Intimate Partner Violence: Not on file     ROS- All systems are reviewed and negative except as per the HPI above.  Physical Exam: Vitals:   07/22/22 1417  BP: 118/86  Pulse: 71  Weight: 99.7 kg  Height:  (1.88 m)    GEN- The patient is a well appearing elderly male, alert and oriented x 3 today.   HEENT-head normocephalic, atraumatic, sclera clear, conjunctiva pink, hearing intact, trachea midline. Lungs- Clear to ausculation bilaterally, normal work of breathing Heart- irregular rate and rhythm, no murmurs, rubs or gallops  GI- soft, NT, ND, + BS Extremities- no clubbing, cyanosis, or edema MS- no significant deformity or atrophy Skin- no rash or lesion Psych- euthymic mood, full affect Neuro- strength and sensation are intact   Wt Readings from Last 3 Encounters:  07/22/22 99.7 kg  06/16/22 98.7 kg  05/28/22 95.7 kg    EKG today demonstrates  Intermittent V  pacing with underlying atrial flutter Vent. rate 71 BPM PR interval * ms QRS duration 116 ms QT/QTcB 412/447 ms  Echo 02/14/21 demonstrated   1. Left ventricular ejection fraction, by estimation, is 55%. The left  ventricle has normal function. The left ventricle has no regional wall  motion abnormalities. There is mild concentric left ventricular  hypertrophy. Left ventricular diastolic parameters are consistent with Grade I diastolic dysfunction (impaired relaxation).   2. Right ventricular systolic function is normal. The right ventricular  size is normal. There is normal pulmonary artery systolic pressure.   3. The mitral valve is normal in structure. No evidence of mitral valve  regurgitation. No evidence of mitral stenosis.   4. The aortic valve is tricuspid. Aortic valve regurgitation is not  visualized. Aortic valve sclerosis is present, with no evidence of aortic  valve stenosis.   Comparison(s): A prior study was performed on 06/10/20. No significant  change from prior study.   Epic records are reviewed at length today  CHA2DS2-VASc Score = 3  The patient's score is based upon: CHF History: 1 HTN History: 0 Diabetes History: 0 Stroke History: 0 Vascular Disease History: 0 Age Score: 2 Gender Score: 0       ASSESSMENT AND PLAN: 1. Persistent Atrial Fibrillation/persistent atrial flutter The patient's CHA2DS2-VASc score is 3, indicating a 3.2% annual risk of stroke.   S/p afib and flutter ablation 02/04/22 S/p DCCV on 05/28/22 with quick return of atrial flutter. Set up for DCCV again 07/13/22 but was in SR at that time. Back in atrial flutter since that time. We discussed rate vs rhythm control again today. Given his atypical flutter, unclear if additional ablation would be beneficial.  Will decrease his amiodarone back to 200 mg daily Will have him see Dr Nelly Laurence to discuss rate vs rhythm control. Patient may opt for a rate control strategy given his paucity of  symptoms.  Continue Eliquis 5 mg BID   2. Secondary Hypercoagulable State (ICD10:  D68.69) The patient is at significant risk for stroke/thromboembolism based upon his CHA2DS2-VASc Score of 3.  Continue Apixaban (Eliquis).   3. ARVC S/p ICD, followed by Dr Graciela Husbands and the device clinic.  4. VT Continue amiodarone.    Follow up with Dr Nelly Laurence.   Jorja Loa PA-C [facility] [address]  [place], [place] [address] [phone] 07/22/2022 2:28 PM

## 2022-07-28 ENCOUNTER — Ambulatory Visit (INDEPENDENT_AMBULATORY_CARE_PROVIDER_SITE_OTHER): Payer: Medicare Other | Admitting: Podiatry

## 2022-07-28 DIAGNOSIS — D2371 Other benign neoplasm of skin of right lower limb, including hip: Secondary | ICD-10-CM

## 2022-07-28 DIAGNOSIS — H43812 Vitreous degeneration, left eye: Secondary | ICD-10-CM | POA: Diagnosis not present

## 2022-07-28 NOTE — Progress Notes (Signed)
Robert Ochoa presents today for follow-up of his calluses beneath the fifth metatarsal head bilaterally.  Objective: Vitals are stable he is alert oriented x 3 there is no erythema Dem salines drainage odor multiple benign skin lesions beneath fifth metatarsal head bilateral no underlying bursitis palpable today.  Assessment: Resolving bursitis subfifth met bilateral with benign skin lesion subfifth met heads bilateral.  Plan: Debrided benign skin lesions subfifth metatarsal head.

## 2022-08-04 DIAGNOSIS — F4322 Adjustment disorder with anxiety: Secondary | ICD-10-CM | POA: Diagnosis not present

## 2022-08-12 ENCOUNTER — Ambulatory Visit: Payer: Medicare Other | Attending: Cardiovascular Disease | Admitting: Cardiovascular Disease

## 2022-08-12 ENCOUNTER — Encounter: Payer: Self-pay | Admitting: Cardiovascular Disease

## 2022-08-12 VITALS — BP 150/96 | HR 70 | Ht 74.0 in | Wt 220.0 lb

## 2022-08-12 DIAGNOSIS — I4821 Permanent atrial fibrillation: Secondary | ICD-10-CM | POA: Diagnosis not present

## 2022-08-12 DIAGNOSIS — I472 Ventricular tachycardia, unspecified: Secondary | ICD-10-CM

## 2022-08-12 DIAGNOSIS — I484 Atypical atrial flutter: Secondary | ICD-10-CM

## 2022-08-12 MED ORDER — AMIODARONE HCL 100 MG PO TABS: 100.0000 mg | ORAL_TABLET | Freq: Every day | ORAL | 3 refills | Status: DC

## 2022-08-12 NOTE — Patient Instructions (Signed)
Medication Instructions:  DECREASE Amiodarone to 100 mg once daily  *If you need a refill on your cardiac medications before your next appointment, please call your pharmacy*   Follow-Up: At [facility], you and your health needs are our priority.  As part of our continuing mission to provide you with exceptional heart care, we have created designated Provider Care Teams.  These Care Teams include your primary Cardiologist (physician) and Advanced Practice Providers (APPs -  Physician Assistants and Nurse Practitioners) who all work together to provide you with the care you need, when you need it.  We recommend signing up for the patient portal called "MyChart".  Sign up information is provided on this After Visit Summary.  MyChart is used to connect with patients for Virtual Visits (Telemedicine).  Patients are able to view lab/test results, encounter notes, upcoming appointments, etc.  Non-urgent messages can be sent to your provider as well.   To learn more about what you can do with MyChart, go to [email or web address].    Your next appointment:   6 month(s)  Provider:   York Pellant, MD

## 2022-08-12 NOTE — Progress Notes (Signed)
Cardiology Office Note:    Date:  08/12/2022   ID:  Robert Ochoa, DOB 1945/08/26, MRN [id number]  PCP:  Farris Has, MD   [facility] Providers Cardiologist:  None Electrophysiologist:  Maurice Small, MD     Referring MD: Farris Has, MD   History of Present Illness:    Robert Ochoa is a 77 y.o. male with a hx of arrhythmogenic right ventricular cardiomyopathy, paroxysmal atrial fibrillation, ventricular tachycardia (see ECG 02/14/2009 with right sided VT), St. Jude biventricular ICD, sinus node dysfunction, referred by Dr. Graciela Husbands to consider ablation.   He has a history of ARVC based on ECG and MRI criteria. He has had VT and now has an Abbott ICD for secondary prevention. The device generator was replaced in December, 2022. Amio was prescribed for VT, but he also has AF. At his last device visit with Dr. Graciela Husbands, his AF burden was noted to have increased, so the amiodarone was also increased.  ECG from 03/27/2021 shows atrial flutter.  Past paceart reports show minimal atrial arrhythmia - usually about a 5% burden of short episodes. In the past few months, however, his burden has been increasing and is now about 32%  05/12/2021 He underwent ablation of fibrillation and typical atrial flutter in November, 2023. He felt fine afterwards. He did not notice any significant changte in symptoms. He had new-onset atypical flutter in January and has remained in it persistently since. He reports no change in symptoms or exertional capacity.   Past Medical History:  Diagnosis Date   Arrhythmogenic right ventricular dysplas [facility])    Arrhythmogenic right ventricular dysplasia (HCC)    Hx of VT s/p dual-chamber St. Jude ICD. Cardiac MRI 2010 showed moderately dilated RV with hypokinesis, LV EF 47%. EF 50-55% 2010 but down to 45% 05/2011. Cath 01/2009 without significant CAD.   Atrial fibrillation or flutter    Paroxysmal. Started on Pradaxa 05/2011.   Cardiac defibrillator in  place    LV dysfunction    per echo 10/2011; EF 35%  // Echo 2/18: EF 40-45, diff HK, Gr 1 DD, mild dilated aortic root, mod LAE, mod RVE, mild reduced RVSF, mod RAE   Prostate cancer (HCC)    Septic shock(785.52) 10-27-11 cause unknown   with associated respiratory/renal failure/PAF/bradycardia   Ventricular tachycardia (HCC)    ICD discharges 05/2011 for this & AF-RVR - started on amiodarone then.   Wears glasses     Past Surgical History:  Procedure Laterality Date   APPENDECTOMY  2002   ATRIAL FIBRILLATION ABLATION N/A 02/04/2022   Procedure: ATRIAL FIBRILLATION ABLATION;  Surgeon: Maurice Small, MD;  Location: [facility] INVASIVE CV LAB;  Service: Cardiovascular;  Laterality: N/A;   CARDIAC DEFIBRILLATOR PLACEMENT  02/19/09    St,. Jude    CARDIOVERSION N/A 05/28/2022   Procedure: CARDIOVERSION;  Surgeon: Wendall Stade, MD;  Location: [facility] ENDOSCOPY;  Service: Cardiovascular;  Laterality: N/A;   ICD GENERATOR CHANGEOUT N/A 03/27/2021   Procedure: ICD GENERATOR CHANGEOUT;  Surgeon: Hillis Range, MD;  Location: [facility] INVASIVE CV LAB;  Service: Cardiovascular;  Laterality: N/A;   IMPLANTABLE CARDIOVERTER DEFIBRILLATOR (ICD) GENERATOR CHANGE N/A 03/01/2013   Procedure: ICD GENERATOR CHANGE;  Surgeon: Duke Salvia, MD;  Location: [facility] CATH LAB;  Service: Cardiovascular;  Laterality: N/A;   IMPLANTABLE CARDIOVERTER DEFIBRILLATOR GENERATOR CHANGE  03/01/13   for premature battery depletetion.  Now has a SJM Ellipse DR device placed by Dr Graciela Husbands   PROSTATE BIOPSY  11/2019   RADIOACTIVE  SEED IMPLANT N/A 04/30/2020   Procedure: RADIOACTIVE SEED IMPLANT/BRACHYTHERAPY IMPLANT;  Surgeon: Rene Paci, MD;  Location: [facility];  Service: Urology;  Laterality: N/A;   SPACE OAR INSTILLATION N/A 04/30/2020   Procedure: SPACE OAR INSTILLATION;  Surgeon: Rene Paci, MD;  Location: [facility];  Service: Urology;  Laterality: N/A;    Current  Medications: Current Meds  Medication Sig   amiodarone (PACERONE) 200 MG tablet Take 1 tablet (200 mg total) by mouth daily.   Calcium-Magnesium-Zinc (CAL-MAG-ZINC PO) Take 2 tablets by mouth daily.   Carboxymethylcellul-Glycerin (LUBRICATING EYE DROPS OP) Place 1 drop into both eyes daily as needed (dry eyes).   ELIQUIS 5 MG TABS tablet TAKE 1 TABLET BY MOUTH TWICE DAILY   furosemide (LASIX) 20 MG tablet Take 1 tablet by mouth daily as needed for swelling.   lisinopril (ZESTRIL) 10 MG tablet TAKE 2 TABLETS BY MOUTH EVERY DAY   Omega-3 Fatty Acids (OMEGA 3 PO) Take 4,800 mg by mouth daily.   sildenafil (REVATIO) 20 MG tablet Take 20-100 mg by mouth daily as needed (ED).   tamsulosin (FLOMAX) 0.4 MG CAPS capsule Take 0.4 mg by mouth daily.   triamcinolone cream (KENALOG) 0.1 % Apply 1 Application topically 2 (two) times daily as needed (itchy scalp).   vitamin C (ASCORBIC ACID) 500 MG tablet Take 1,000 mg by mouth daily.     Allergies:   Patient has no known allergies.   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 Self employed.    Social Determinants of Health   Financial Resource Strain: Not on file  Food Insecurity: Not on file  Transportation Needs: Not on file  Physical  Activity: Not on file  Stress: Not on file  Social Connections: Not on file     Family History: The patient's family history includes Allergies in his mother; Cancer in his paternal aunt; Heart attack in his father; Prostate cancer in his cousin. There is no history of Colon cancer, Pancreatic cancer, or Breast cancer.  ROS:   Please see the history of present illness.    All other systems reviewed and are negative.  EKGs/Labs/Other Studies Reviewed:    I personally reviewed all prior ECGs available in [facility]. Notably: 03/27/21 - shows atrial flutter 02/14/2009 - VT with a RV origin  TTE 02/14/2021  1. Left ventricular ejection fraction, by estimation, is 55%. The left  ventricle has normal function. The left ventricle has no regional wall  motion abnormalities. There is mild concentric left ventricular  hypertrophy. Left ventricular diastolic  parameters are consistent with Grade I diastolic  dysfunction (impaired  relaxation).   2. Right ventricular systolic function is normal. The right ventricular  size is normal. There is normal pulmonary artery systolic pressure.   3. The mitral valve is normal in structure. No evidence of mitral valve  regurgitation. No evidence of mitral stenosis.   4. The aortic valve is tricuspid. Aortic valve regurgitation is not  visualized. Aortic valve sclerosis is present, with no evidence of aortic  valve stenosis.   CMR 02/18/2009 1)    No evidence of fatty infiltration of the RV.  However RV is        clearly abnormal with moderate dilatation and hypokinesis       2)    Mildly reduced LV function due to abnormal septal motion and  PVC's during the exam.  EF 47%  3)    No hyperenhancement of the LV  4)    Mild RA enlargement.      EKG:  Last EKG results: today -- sinus rhythm with conversion to atrial flutter. Left axis deviation.   Recent Labs: 12/26/2021: ALT 25; TSH 1.020 07/06/2022: BUN 25; Creatinine, Ser 1.28; Hemoglobin 16.5; Platelets  171; Potassium 4.3; Sodium 136      Physical Exam:    VS:  BP (!) 150/96   Pulse 70   Ht 6\' 2"  (1.88 m)   Wt 220 lb (99.8 kg)   SpO2 98%   BMI 28.25 kg/m     Wt Readings from Last 3 Encounters:  08/12/22 220 lb (99.8 kg)  07/22/22 219 lb 12.8 oz (99.7 kg)  06/16/22 217 lb 9.6 oz (98.7 kg)     GEN: Well nourished, well developed in no acute distress CARDIAC: RRR, no murmurs, rubs, gallops RESPIRATORY:  Normal work of breathing MUSCULOSKELETAL: no edema    ASSESSMENT & PLAN:    Atrial fibrillation and flutter:  s/p ablation of AF and typical flutter 01/2022.  Had recurrence with atypical flutter. Asymptomatic.  S/p DCCV 05/28/22 with Patient presented for DC cardioversion 4/15 but was in sinus rhythm He is in AF today, asymptomatic. I do not see much utility in additional ablation. Permanent AF. Will continue rate control  ARVC, VT: continue previous dose of amiodarone -- 100mg   VT: on amiodarone  Amiodarone therapy  Sinus bradycardia: abbot pacemaker normal function. See PaceArt for details. I reviewed the interrogation today in detail.           Medication Adjustments/Labs and Tests Ordered: Current medicines are reviewed at length with the patient today.  Concerns regarding medicines are outlined above.  No orders of the defined types were placed in this encounter.  No orders of the defined types were placed in this encounter.    Signed, Maurice Small, MD  08/12/2022 2:48 PM    [facility]

## 2022-08-14 NOTE — Progress Notes (Signed)
Remote pacemaker transmission.   

## 2022-08-18 DIAGNOSIS — F4322 Adjustment disorder with anxiety: Secondary | ICD-10-CM | POA: Diagnosis not present

## 2022-09-01 DIAGNOSIS — F4322 Adjustment disorder with anxiety: Secondary | ICD-10-CM | POA: Diagnosis not present

## 2022-09-03 ENCOUNTER — Encounter: Payer: Self-pay | Admitting: Cardiovascular Disease

## 2022-09-15 DIAGNOSIS — F4322 Adjustment disorder with anxiety: Secondary | ICD-10-CM | POA: Diagnosis not present

## 2022-09-28 DIAGNOSIS — F4322 Adjustment disorder with anxiety: Secondary | ICD-10-CM | POA: Diagnosis not present

## 2022-10-01 ENCOUNTER — Other Ambulatory Visit: Payer: Self-pay | Admitting: Internal Medicine

## 2022-10-07 ENCOUNTER — Ambulatory Visit (INDEPENDENT_AMBULATORY_CARE_PROVIDER_SITE_OTHER): Payer: Medicare Other

## 2022-10-07 DIAGNOSIS — I428 Other cardiomyopathies: Secondary | ICD-10-CM

## 2022-10-07 LAB — CUP PACEART REMOTE DEVICE CHECK
Battery Remaining Longevity: 53 mo
Battery Remaining Percentage: 78 %
Battery Voltage: 2.98 V
Brady Statistic AP VP Percent: 39 %
Brady Statistic AP VS Percent: 13 %
Brady Statistic AS VP Percent: 43 %
Brady Statistic AS VS Percent: 4 %
Brady Statistic RA Percent Paced: 1 %
Brady Statistic RV Percent Paced: 77 %
Date Time Interrogation Session: 20240710043803
HighPow Impedance: 54 Ohm
HighPow Impedance: 54 Ohm
Implantable Lead Connection Status: 753985
Implantable Lead Connection Status: 753985
Implantable Lead Implant Date: 20101123
Implantable Lead Implant Date: 20101123
Implantable Lead Location: 753859
Implantable Lead Location: 753860
Implantable Lead Model: 7121
Implantable Pulse Generator Implant Date: 20201229
Lead Channel Impedance Value: 390 Ohm
Lead Channel Impedance Value: 460 Ohm
Lead Channel Pacing Threshold Amplitude: 0.75 V
Lead Channel Pacing Threshold Amplitude: 1.5 V
Lead Channel Pacing Threshold Pulse Width: 0.5 ms
Lead Channel Pacing Threshold Pulse Width: 0.6 ms
Lead Channel Sensing Intrinsic Amplitude: 1.8 mV
Lead Channel Sensing Intrinsic Amplitude: 9.2 mV
Lead Channel Setting Pacing Amplitude: 2 V
Lead Channel Setting Pacing Amplitude: 3 V
Lead Channel Setting Pacing Pulse Width: 0.6 ms
Lead Channel Setting Sensing Sensitivity: 0.5 mV
Pulse Gen Serial Number: 8907499

## 2022-10-13 DIAGNOSIS — F4322 Adjustment disorder with anxiety: Secondary | ICD-10-CM | POA: Diagnosis not present

## 2022-10-14 DIAGNOSIS — L570 Actinic keratosis: Secondary | ICD-10-CM | POA: Diagnosis not present

## 2022-10-14 DIAGNOSIS — L812 Freckles: Secondary | ICD-10-CM | POA: Diagnosis not present

## 2022-10-14 DIAGNOSIS — D0439 Carcinoma in situ of skin of other parts of face: Secondary | ICD-10-CM | POA: Diagnosis not present

## 2022-10-14 DIAGNOSIS — D485 Neoplasm of uncertain behavior of skin: Secondary | ICD-10-CM | POA: Diagnosis not present

## 2022-10-14 DIAGNOSIS — L821 Other seborrheic keratosis: Secondary | ICD-10-CM | POA: Diagnosis not present

## 2022-10-14 DIAGNOSIS — Z85828 Personal history of other malignant neoplasm of skin: Secondary | ICD-10-CM | POA: Diagnosis not present

## 2022-10-28 NOTE — Progress Notes (Signed)
Remote pacemaker transmission.   

## 2022-11-09 DIAGNOSIS — C44329 Squamous cell carcinoma of skin of other parts of face: Secondary | ICD-10-CM | POA: Diagnosis not present

## 2022-11-09 DIAGNOSIS — Z85828 Personal history of other malignant neoplasm of skin: Secondary | ICD-10-CM | POA: Diagnosis not present

## 2022-11-10 DIAGNOSIS — F4322 Adjustment disorder with anxiety: Secondary | ICD-10-CM | POA: Diagnosis not present

## 2022-11-16 ENCOUNTER — Encounter: Payer: Medicare Other | Admitting: Cardiovascular Disease

## 2022-11-24 DIAGNOSIS — F4322 Adjustment disorder with anxiety: Secondary | ICD-10-CM | POA: Diagnosis not present

## 2022-11-26 ENCOUNTER — Ambulatory Visit (INDEPENDENT_AMBULATORY_CARE_PROVIDER_SITE_OTHER): Payer: Medicare Other | Admitting: Podiatry

## 2022-11-26 ENCOUNTER — Encounter: Payer: Self-pay | Admitting: Podiatry

## 2022-11-26 DIAGNOSIS — D2371 Other benign neoplasm of skin of right lower limb, including hip: Secondary | ICD-10-CM | POA: Diagnosis not present

## 2022-11-26 DIAGNOSIS — I700 Atherosclerosis of aorta: Secondary | ICD-10-CM | POA: Insufficient documentation

## 2022-11-26 NOTE — Progress Notes (Signed)
He presents today for follow-up of his subfifth metatarsophalangeal joints bilaterally.  Objective: Vital signs are stable alert oriented x 3 solitary porokeratotic lesion beneath the fifth metatarsal head bilateral no open lesions or wounds.  No change in physical findings otherwise.  Assessment: Benign skin lesion subfifth met bilateral cavus foot deformity bilateral.  Plan: Debridement of benign skin lesions bilateral no iatrogenic lesions he will follow-up with [place] as needed.

## 2022-12-08 DIAGNOSIS — F4322 Adjustment disorder with anxiety: Secondary | ICD-10-CM | POA: Diagnosis not present

## 2022-12-14 DIAGNOSIS — C61 Malignant neoplasm of prostate: Secondary | ICD-10-CM | POA: Diagnosis not present

## 2022-12-30 DIAGNOSIS — C61 Malignant neoplasm of prostate: Secondary | ICD-10-CM | POA: Diagnosis not present

## 2023-01-02 ENCOUNTER — Other Ambulatory Visit: Payer: Self-pay | Admitting: Cardiovascular Disease

## 2023-01-02 DIAGNOSIS — I480 Paroxysmal atrial fibrillation: Secondary | ICD-10-CM

## 2023-01-04 NOTE — Telephone Encounter (Signed)
Prescription refill request for Eliquis received. Indication:afib Last office visit:5/24 Scr:1.28  4/24 Age: 77 Weight:99.8  kg  Prescription refilled

## 2023-01-06 ENCOUNTER — Ambulatory Visit (INDEPENDENT_AMBULATORY_CARE_PROVIDER_SITE_OTHER): Payer: Medicare Other

## 2023-01-06 DIAGNOSIS — I428 Other cardiomyopathies: Secondary | ICD-10-CM

## 2023-01-08 LAB — CUP PACEART REMOTE DEVICE CHECK
Battery Remaining Longevity: 52 mo
Battery Remaining Percentage: 75 %
Battery Voltage: 2.98 V
Brady Statistic AP VP Percent: 21 %
Brady Statistic AP VS Percent: 3.8 %
Brady Statistic AS VP Percent: 60 %
Brady Statistic AS VS Percent: 15 %
Brady Statistic RA Percent Paced: 1 %
Brady Statistic RV Percent Paced: 67 %
Date Time Interrogation Session: 20241010231304
HighPow Impedance: 54 Ohm
HighPow Impedance: 54 Ohm
Implantable Lead Connection Status: 753985
Implantable Lead Connection Status: 753985
Implantable Lead Implant Date: 20101123
Implantable Lead Implant Date: 20101123
Implantable Lead Location: 753859
Implantable Lead Location: 753860
Implantable Lead Model: 7121
Implantable Pulse Generator Implant Date: 20201229
Lead Channel Impedance Value: 380 Ohm
Lead Channel Impedance Value: 460 Ohm
Lead Channel Pacing Threshold Amplitude: 0.75 V
Lead Channel Pacing Threshold Amplitude: 1.5 V
Lead Channel Pacing Threshold Pulse Width: 0.5 ms
Lead Channel Pacing Threshold Pulse Width: 0.6 ms
Lead Channel Sensing Intrinsic Amplitude: 1.7 mV
Lead Channel Sensing Intrinsic Amplitude: 9.6 mV
Lead Channel Setting Pacing Amplitude: 2 V
Lead Channel Setting Pacing Amplitude: 3 V
Lead Channel Setting Pacing Pulse Width: 0.6 ms
Lead Channel Setting Sensing Sensitivity: 0.5 mV
Pulse Gen Serial Number: 8907499

## 2023-01-16 DIAGNOSIS — Z23 Encounter for immunization: Secondary | ICD-10-CM | POA: Diagnosis not present

## 2023-01-19 DIAGNOSIS — F4322 Adjustment disorder with anxiety: Secondary | ICD-10-CM | POA: Diagnosis not present

## 2023-01-27 NOTE — Progress Notes (Signed)
Remote pacemaker transmission.   

## 2023-02-02 DIAGNOSIS — F4322 Adjustment disorder with anxiety: Secondary | ICD-10-CM | POA: Diagnosis not present

## 2023-02-16 DIAGNOSIS — F4322 Adjustment disorder with anxiety: Secondary | ICD-10-CM | POA: Diagnosis not present

## 2023-03-02 DIAGNOSIS — F4322 Adjustment disorder with anxiety: Secondary | ICD-10-CM | POA: Diagnosis not present

## 2023-03-16 DIAGNOSIS — F4322 Adjustment disorder with anxiety: Secondary | ICD-10-CM | POA: Diagnosis not present

## 2023-03-18 ENCOUNTER — Ambulatory Visit: Payer: Medicare Other | Admitting: Podiatry

## 2023-03-22 ENCOUNTER — Other Ambulatory Visit: Payer: Self-pay | Admitting: Cardiovascular Disease

## 2023-03-30 DIAGNOSIS — F4322 Adjustment disorder with anxiety: Secondary | ICD-10-CM | POA: Diagnosis not present

## 2023-04-02 DIAGNOSIS — C61 Malignant neoplasm of prostate: Secondary | ICD-10-CM | POA: Diagnosis not present

## 2023-04-06 ENCOUNTER — Telehealth: Payer: Self-pay | Admitting: *Deleted

## 2023-04-06 NOTE — Telephone Encounter (Signed)
 Primary Cardiologist:None   Preoperative team, please contact this patient and set up a phone call appointment or patient can await office visit scheduled for 05/24/23 for further preoperative risk assessment. Please obtain consent and complete medication review. Thank you for your help.   Per office protocol, patient can hold Eliquis  for 3 days prior to procedure and should resume as soon as hemodynamically stable post procedure.   I also confirmed the patient resides in the state of [place] . As per [facility] Medical Board telemedicine laws, the patient must reside in the state in which the provider is licensed.   Rosaline EMERSON Bane, NP-C  04/06/2023, 4:34 PM [address] Office [phone] Fax [phone]

## 2023-04-06 NOTE — Telephone Encounter (Signed)
 Patient with diagnosis of afib on Eliquis  for anticoagulation.    Procedure: prostate biopsy Date of procedure: TBD   CHA2DS2-VASc Score = 4   This indicates a 4.8% annual risk of stroke. The patient's score is based upon: CHF History: 1 HTN History: 1 Diabetes History: 0 Stroke History: 0 Vascular Disease History: 0 Age Score: 2 Gender Score: 0      CrCl 67 ml/min Platelet count 171  Per office protocol, patient can hold Eliquis  for 3 days prior to procedure.    **This guidance is not considered finalized until pre-operative APP has relayed final recommendations.**

## 2023-04-06 NOTE — Telephone Encounter (Signed)
   Pre-operative Risk Assessment    Patient Name: Robert Ochoa  DOB: Jun 24, 1945 MRN: [id number]   Date of last office visit: 08/12/22 Date of next office visit: 24/25   Request for Surgical Clearance    Procedure:   PROSTATE BX  Date of Surgery:  Clearance TBD                                Surgeon:  DR. LONNI HAN Surgeon's Group or Practice Name:  [facility] Phone number:  [phone] Fax number:  [phone]   Type of Clearance Requested:   - Pharmacy:  Hold Apixaban  (Eliquis ) X'S 3 DAYS   Type of Anesthesia:  Not Indicated   Additional requests/questions:    [facility]   04/06/2023, 1:11 PM

## 2023-04-07 ENCOUNTER — Ambulatory Visit (INDEPENDENT_AMBULATORY_CARE_PROVIDER_SITE_OTHER): Payer: Medicare Other

## 2023-04-07 DIAGNOSIS — I428 Other cardiomyopathies: Secondary | ICD-10-CM

## 2023-04-07 LAB — CUP PACEART REMOTE DEVICE CHECK
Battery Remaining Longevity: 51 mo
Battery Remaining Percentage: 72 %
Battery Voltage: 2.98 V
Brady Statistic AP VP Percent: 16 %
Brady Statistic AP VS Percent: 2.8 %
Brady Statistic AS VP Percent: 68 %
Brady Statistic AS VS Percent: 14 %
Brady Statistic RA Percent Paced: 1 %
Brady Statistic RV Percent Paced: 63 %
Date Time Interrogation Session: 20250108024645
HighPow Impedance: 54 Ohm
HighPow Impedance: 54 Ohm
Implantable Lead Connection Status: 753985
Implantable Lead Connection Status: 753985
Implantable Lead Implant Date: 20101123
Implantable Lead Implant Date: 20101123
Implantable Lead Location: 753859
Implantable Lead Location: 753860
Implantable Lead Model: 7121
Implantable Pulse Generator Implant Date: 20201229
Lead Channel Impedance Value: 380 Ohm
Lead Channel Impedance Value: 450 Ohm
Lead Channel Pacing Threshold Amplitude: 0.75 V
Lead Channel Pacing Threshold Amplitude: 1.5 V
Lead Channel Pacing Threshold Pulse Width: 0.5 ms
Lead Channel Pacing Threshold Pulse Width: 0.6 ms
Lead Channel Sensing Intrinsic Amplitude: 2.8 mV
Lead Channel Sensing Intrinsic Amplitude: 9.6 mV
Lead Channel Setting Pacing Amplitude: 2 V
Lead Channel Setting Pacing Amplitude: 3 V
Lead Channel Setting Pacing Pulse Width: 0.6 ms
Lead Channel Setting Sensing Sensitivity: 0.5 mV
Pulse Gen Serial Number: 8907499

## 2023-04-07 NOTE — Telephone Encounter (Signed)
 1st attempt to reach pt regarding surgical clearance and the need for an TELE appointment.  Left pt a detailed message to call back and get that scheduled.

## 2023-04-12 ENCOUNTER — Telehealth: Payer: Self-pay | Admitting: *Deleted

## 2023-04-12 NOTE — Telephone Encounter (Signed)
 Pt has been scheduled tele pre op appt 04/21/23. Med rec and consent are done.

## 2023-04-12 NOTE — Telephone Encounter (Signed)
 Pt has been scheduled tele pre op appt 04/21/23. Med rec and consent are done.      Patient Consent for Virtual Visit        Robert Ochoa has provided verbal consent on 04/12/2023 for a virtual visit (video or telephone).   CONSENT FOR VIRTUAL VISIT FOR:  Robert Ochoa  By participating in this virtual visit I agree to the following:  I hereby voluntarily request, consent and authorize [facility] and its employed or contracted physicians, physician assistants, nurse practitioners or other licensed health care professionals (the Practitioner), to provide me with telemedicine health care services (the "Services) as deemed necessary by the treating Practitioner. I acknowledge and consent to receive the Services by the Practitioner via telemedicine. I understand that the telemedicine visit will involve communicating with the Practitioner through live audiovisual communication technology and the disclosure of certain medical information by electronic transmission. I acknowledge that I have been given the opportunity to request an in-person assessment or other available alternative prior to the telemedicine visit and am voluntarily participating in the telemedicine visit.  I understand that I have the right to withhold or withdraw my consent to the use of telemedicine in the course of my care at any time, without affecting my right to future care or treatment, and that the Practitioner or I may terminate the telemedicine visit at any time. I understand that I have the right to inspect all information obtained and/or recorded in the course of the telemedicine visit and may receive copies of available information for a reasonable fee.  I understand that some of the potential risks of receiving the Services via telemedicine include:  Delay or interruption in medical evaluation due to technological equipment failure or disruption; Information transmitted may not be sufficient (e.g. poor  resolution of images) to allow for appropriate medical decision making by the Practitioner; and/or  In rare instances, security protocols could fail, causing a breach of personal health information.  Furthermore, I acknowledge that it is my responsibility to provide information about my medical history, conditions and care that is complete and accurate to the best of my ability. I acknowledge that Practitioner's advice, recommendations, and/or decision may be based on factors not within their control, such as incomplete or inaccurate data provided by me or distortions of diagnostic images or specimens that may result from electronic transmissions. I understand that the practice of medicine is not an exact science and that Practitioner makes no warranties or guarantees regarding treatment outcomes. I acknowledge that a copy of this consent can be made available to me via my patient portal [facility] MyChart), or I can request a printed copy by calling the office of [facility].    I understand that my insurance will be billed for this visit.   I have read or had this consent read to me. I understand the contents of this consent, which adequately explains the benefits and risks of the Services being provided via telemedicine.  I have been provided ample opportunity to ask questions regarding this consent and the Services and have had my questions answered to my satisfaction. I give my informed consent for the services to be provided through the use of telemedicine in my medical care

## 2023-04-13 DIAGNOSIS — F4322 Adjustment disorder with anxiety: Secondary | ICD-10-CM | POA: Diagnosis not present

## 2023-04-19 DIAGNOSIS — L820 Inflamed seborrheic keratosis: Secondary | ICD-10-CM | POA: Diagnosis not present

## 2023-04-19 DIAGNOSIS — L821 Other seborrheic keratosis: Secondary | ICD-10-CM | POA: Diagnosis not present

## 2023-04-19 DIAGNOSIS — Z85828 Personal history of other malignant neoplasm of skin: Secondary | ICD-10-CM | POA: Diagnosis not present

## 2023-04-19 DIAGNOSIS — L570 Actinic keratosis: Secondary | ICD-10-CM | POA: Diagnosis not present

## 2023-04-20 ENCOUNTER — Ambulatory Visit: Payer: Medicare Other | Admitting: Podiatry

## 2023-04-21 ENCOUNTER — Ambulatory Visit: Payer: Medicare Other | Attending: Cardiology

## 2023-04-21 DIAGNOSIS — Z01810 Encounter for preprocedural cardiovascular examination: Secondary | ICD-10-CM | POA: Diagnosis not present

## 2023-04-21 NOTE — Progress Notes (Signed)
Virtual Visit via Telephone Note   Because of Robert Ochoa's co-morbid illnesses, he is at least at moderate risk for complications without adequate follow up.  This format is felt to be most appropriate for this patient at this time.  The patient did not have access to video technology/had technical difficulties with video requiring transitioning to audio format only (telephone).  All issues noted in this document were discussed and addressed.  No physical exam could be performed with this format.  Please refer to the patient's chart for his consent to telehealth for [facility].  Evaluation Performed:  Preoperative cardiovascular risk assessment _____________   Date:  04/21/2023   Patient ID:  Robert Ochoa, DOB 1946-01-15, MRN [id number] Patient Location:  Home Provider location:   Office  Primary Care Provider:  Farris Has, MD Primary Cardiologist:  None  Chief Complaint / Patient Profile   78 y.o. y/o male with a h/o arrhythmogenic RV dysplasia and cardiomyopathy s/p Saint Jude BiV ICD, paroxysmal AF, VT, prostate CVA who is pending prostate biopsy and presents today for telephonic preoperative cardiovascular risk assessment.  History of Present Illness    Robert Ochoa is a 78 y.o. male who presents via audio/video conferencing for a telehealth visit today.  Pt was last seen in cardiology clinic on 08/12/2022 by Dr. Nelly Laurence. At that time Robert Ochoa was doing well with rate controlled AF and recommendation to not pursue additional ablation. The patient is now pending procedure as outlined above. Since his last visit, he reports that he has been doing well with no new cardiac complaints or arrhythmia or concerns.    He denies chest pain, shortness of breath, lower extremity edema, fatigue, palpitations, melena, hematuria, hemoptysis, diaphoresis, weakness, presyncope, syncope, orthopnea, and PND.   Per office protocol, patient can hold Eliquis for 3 days prior to  procedure.    Past Medical History    Past Medical History:  Diagnosis Date   Arrhythmogenic right ventricular dysplas (HCC)    Arrhythmogenic right ventricular dysplasia (HCC)    Hx of VT s/p dual-chamber St. Jude ICD. Cardiac MRI 2010 showed moderately dilated RV with hypokinesis, LV EF 47%. EF 50-55% 2010 but down to 45% 05/2011. Cath 01/2009 without significant CAD.   Atrial fibrillation or flutter    Paroxysmal. Started on Pradaxa 05/2011.   Cardiac defibrillator in place    LV dysfunction    per echo 10/2011; EF 35%  // Echo 2/18: EF 40-45, diff HK, Gr 1 DD, mild dilated aortic root, mod LAE, mod RVE, mild reduced RVSF, mod RAE   Prostate cancer (HCC)    Septic shock(785.52) 10-27-11 cause unknown   with associated respiratory/renal failure/PAF/bradycardia   Ventricular tachycardia (HCC)    ICD discharges 05/2011 for this & AF-RVR - started on amiodarone then.   Wears glasses    Past Surgical History:  Procedure Laterality Date   APPENDECTOMY  2002   ATRIAL FIBRILLATION ABLATION N/A 02/04/2022   Procedure: ATRIAL FIBRILLATION ABLATION;  Surgeon: Maurice Small, MD;  [facility];  Service: Cardiovascular;  Laterality: N/A;   CARDIAC DEFIBRILLATOR PLACEMENT  02/19/09    St,. Jude    CARDIOVERSION N/A 05/28/2022   Procedure: CARDIOVERSION;  Surgeon: Wendall Stade, MD;  Location: [facility] ENDOSCOPY;  Service: Cardiovascular;  Laterality: N/A;   ICD GENERATOR CHANGEOUT N/A 03/27/2021   Procedure: ICD GENERATOR CHANGEOUT;  Surgeon: Hillis Range, MD;  Location: [facility];  Service: Cardiovascular;  Laterality: N/A;  IMPLANTABLE CARDIOVERTER DEFIBRILLATOR (ICD) GENERATOR CHANGE N/A 03/01/2013   Procedure: ICD GENERATOR CHANGE;  Surgeon: Duke Salvia, MD;  Location: [facility] CATH LAB;  Service: Cardiovascular;  Laterality: N/A;   IMPLANTABLE CARDIOVERTER DEFIBRILLATOR GENERATOR CHANGE  03/01/13   for premature battery depletetion.  Now has a SJM Ellipse DR device placed  by Dr Graciela Husbands   PROSTATE BIOPSY  11/2019   RADIOACTIVE SEED IMPLANT N/A 04/30/2020   Procedure: RADIOACTIVE SEED IMPLANT/BRACHYTHERAPY IMPLANT;  Surgeon: Rene Paci, MD;  Location: [facility];  Service: Urology;  Laterality: N/A;   SPACE OAR INSTILLATION N/A 04/30/2020   Procedure: SPACE OAR INSTILLATION;  Surgeon: Rene Paci, MD;  Location: [facility];  Service: Urology;  Laterality: N/A;    Allergies  No Known Allergies  Home Medications    Prior to Admission medications   Medication Sig Start Date End Date Taking? Authorizing Provider  amiodarone (PACERONE) 100 MG tablet Take 1 tablet (100 mg total) by mouth daily. 08/12/22   Mealor, Roberts Gaudy, MD  Calcium-Magnesium-Zinc (CAL-MAG-ZINC PO) Take 2 tablets by mouth daily.    [provider]  Carboxymethylcellul-Glycerin (LUBRICATING EYE DROPS OP) Place 1 drop into both eyes daily as needed (dry eyes).    [provider]  ELIQUIS 5 MG TABS tablet TAKE 1 TABLET BY MOUTH TWICE DAILY 01/04/23   Mealor, Roberts Gaudy, MD  furosemide (LASIX) 20 MG tablet TAKE 1 TABLET BY MOUTH DAILY AS NEEDED FOR SWELLING 03/22/23   Mealor, Roberts Gaudy, MD  lisinopril (ZESTRIL) 10 MG tablet TAKE 2 TABLETS BY MOUTH EVERY DAY 10/02/22   Duke Salvia, MD  Omega-3 Fatty Acids (OMEGA 3 PO) Take 4,800 mg by mouth daily.    [provider]  sildenafil (REVATIO) 20 MG tablet Take 20-100 mg by mouth daily as needed (ED).    [provider]  tamsulosin (FLOMAX) 0.4 MG CAPS capsule Take 0.4 mg by mouth daily. 05/16/20   [provider]  triamcinolone cream (KENALOG) 0.1 % Apply 1 Application topically 2 (two) times daily as needed (itchy scalp). 04/15/22   [provider]  vitamin C (ASCORBIC ACID) 500 MG tablet Take 1,000 mg by mouth daily.    [provider]    Physical Exam    Vital Signs:  Robert Ochoa does not have vital signs available for review  today.  Given telephonic nature of communication, physical exam is limited. AAOx3. NAD. Normal affect.  Speech and respirations are unlabored.  Accessory Clinical Findings    None  Assessment & Plan    1.  Preoperative Cardiovascular Risk Assessment: -Patient's RCRI score is 0.9%  The patient affirms he has been doing well without any new cardiac symptoms. They are able to achieve 7 METS without cardiac limitations. Therefore, based on ACC/AHA guidelines, the patient would be at acceptable risk for the planned procedure without further cardiovascular testing. The patient was advised that if he develops new symptoms prior to surgery to contact our office to arrange for a follow-up visit, and he verbalized understanding.   The patient was advised that if he develops new symptoms prior to surgery to contact our office to arrange for a follow-up visit, and he verbalized understanding.   Per office protocol, patient can hold Eliquis for 3 days prior to procedure.    A copy of this note will be routed to requesting surgeon.  Time:   Today, I have spent 7 minutes with the patient with telehealth technology discussing medical  history, symptoms, and management plan.     Napoleon Form, Leodis Rains, NP  04/21/2023, 7:33 AM

## 2023-04-27 DIAGNOSIS — F4322 Adjustment disorder with anxiety: Secondary | ICD-10-CM | POA: Diagnosis not present

## 2023-05-04 ENCOUNTER — Ambulatory Visit (INDEPENDENT_AMBULATORY_CARE_PROVIDER_SITE_OTHER): Payer: Medicare Other | Admitting: Podiatry

## 2023-05-04 ENCOUNTER — Ambulatory Visit: Payer: Medicare Other | Admitting: Podiatry

## 2023-05-04 ENCOUNTER — Encounter: Payer: Self-pay | Admitting: Podiatry

## 2023-05-04 DIAGNOSIS — Q667 Congenital pes cavus, unspecified foot: Secondary | ICD-10-CM

## 2023-05-04 DIAGNOSIS — L84 Corns and callosities: Secondary | ICD-10-CM | POA: Diagnosis not present

## 2023-05-07 ENCOUNTER — Other Ambulatory Visit: Payer: Self-pay | Admitting: Cardiovascular Disease

## 2023-05-09 NOTE — Progress Notes (Signed)
 Subjective: Chief Complaint  Patient presents with   Callouses    RM#12 Right foot pain at ball of foot ongoing issue.    78 year old male presents the office with above concerns.  He states that the calluses come back again causing discomfort.  This been ongoing issue for him.  No recent injury or changes.  Objective: AAO x3, NAD DP/PT pulses palpable bilaterally, CRT less than 3 seconds Operative lesion noted right foot submetatarsal 5 without underlying ulceration, drainage or signs of infection.  His cavus foot type is present.  There is prominent metatarsal head with atrophy of the fat pad. No pain with calf compression, swelling, warmth, erythema  Assessment: Hyperkeratotic lesion, prominent metatarsal head and  Plan: Debrided lesion with any complications or bleeding.  Discussed moisturizer, offloading.  May benefit from custom orthotic to help offload given the cavus foot type, prominent metatarsal head.  RTC 4 months with Dr. Verta or sooner if needed  Donnice JONELLE Fees DPM

## 2023-05-11 DIAGNOSIS — F4322 Adjustment disorder with anxiety: Secondary | ICD-10-CM | POA: Diagnosis not present

## 2023-05-12 NOTE — Progress Notes (Signed)
[facility] [address] [place] [address] Phone: [phone] Subjective:   Robert Ochoa, am serving as a scribe for Dr. Antoine Primas.  I'm seeing this patient by the request  of:  Farris Has, MD  CC: Low back and left hip pain  [id number]  Robert Ochoa is a 78 y.o. male coming in with complaint of LBP and L hip pain. Patient states back pain for about a year. Has gotten worse over the last for months. L side is the worse.  Patient denies that it is waking him up at night, has been able to still be fairly active though.       Past Medical History:  Diagnosis Date   Arrhythmogenic right ventricular dysplas [facility])    Arrhythmogenic right ventricular dysplasia (HCC)    Hx of VT s/p dual-chamber St. Jude ICD. Cardiac MRI 2010 showed moderately dilated RV with hypokinesis, LV EF 47%. EF 50-55% 2010 but down to 45% 05/2011. Cath 01/2009 without significant CAD.   Atrial fibrillation or flutter    Paroxysmal. Started on Pradaxa 05/2011.   Cardiac defibrillator in place    LV dysfunction    per echo 10/2011; EF 35%  // Echo 2/18: EF 40-45, diff HK, Gr 1 DD, mild dilated aortic root, mod LAE, mod RVE, mild reduced RVSF, mod RAE   Prostate cancer (HCC)    Septic shock(785.52) 10-27-11 cause unknown   with associated respiratory/renal failure/PAF/bradycardia   Ventricular tachycardia (HCC)    ICD discharges 05/2011 for this & AF-RVR - started on amiodarone then.   Wears glasses    Past Surgical History:  Procedure Laterality Date   APPENDECTOMY  2002   ATRIAL FIBRILLATION ABLATION N/A 02/04/2022   Procedure: ATRIAL FIBRILLATION ABLATION;  Surgeon: Maurice Small, MD;  [facility];  Service: Cardiovascular;  Laterality: N/A;   CARDIAC DEFIBRILLATOR PLACEMENT  02/19/09    St,. Jude    CARDIOVERSION N/A 05/28/2022   Procedure: CARDIOVERSION;  Surgeon: Wendall Stade, MD;  Location: [facility] ENDOSCOPY;  Service: Cardiovascular;   Laterality: N/A;   ICD GENERATOR CHANGEOUT N/A 03/27/2021   Procedure: ICD GENERATOR CHANGEOUT;  Surgeon: Hillis Range, MD;  Location: [facility]AS[facility] CV LAB;  Service: Cardiovascular;  Laterality: N/A;   IMPLANTABLE CARDIOVERTER DEFIBRILLATOR (ICD) GENERATOR CHANGE N/A 03/01/2013   Procedure: ICD GENERATOR CHANGE;  Surgeon: Duke Salvia, MD;  Location: [facility] CATH LAB;  Service: Cardiovascular;  Laterality: N/A;   IMPLANTABLE CARDIOVERTER DEFIBRILLATOR GENERATOR CHANGE  03/01/13   for premature battery depletetion.  Now has a SJM Ellipse DR device placed by Dr Graciela Husbands   PROSTATE BIOPSY  11/2019   RADIOACTIVE SEED IMPLANT N/A 04/30/2020   Procedure: RADIOACTIVE SEED IMPLANT/BRACHYTHERAPY IMPLANT;  Surgeon: Rene Paci, MD;  Location: [facility];  Service: Urology;  Laterality: N/A;   SPACE OAR INSTILLATION N/A 04/30/2020   Procedure: SPACE OAR INSTILLATION;  Surgeon: Rene Paci, MD;  Location: [facility];  Service: Urology;  Laterality: N/A;   Social History   Socioeconomic History   Marital status: Single    Spouse name: Not on file   Number of children: 1   Years of education: Not on file   Highest education level: Not on file  Occupational History    Employer: [employer]    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 Self employed.    Social Drivers of Corporate investment banker Strain: Not on file  Food Insecurity: Not on file  Transportation Needs: Not on file   Physical Activity: Not on file  Stress: Not on file  Social Connections: Not on file   No Known Allergies Family History  Problem Relation Age of Onset   Heart attack Father    Allergies Mother    Cancer Paternal Aunt        type unknown   Prostate cancer Cousin    Colon cancer Neg Hx    Pancreatic cancer Neg Hx    Breast cancer Neg Hx      Current Outpatient Medications (Cardiovascular):    amiodarone (PACERONE) 100 MG tablet, TAKE 1 TABLET(100 MG) BY MOUTH DAILY   furosemide (LASIX) 20 MG tablet, TAKE 1 TABLET BY MOUTH DAILY AS NEEDED FOR SWELLING   lisinopril (ZESTRIL) 10 MG tablet, TAKE 2 TABLETS BY MOUTH EVERY DAY   sildenafil (REVATIO) 20 MG tablet, Take 20-100 mg by mouth daily as needed (ED).    Current Outpatient Medications (Hematological):    ELIQUIS 5 MG TABS tablet, TAKE 1 TABLET BY MOUTH TWICE DAILY  Current Outpatient Medications (Other):    Calcium-Magnesium-Zinc (CAL-MAG-ZINC PO), Take 2 tablets by mouth daily.   Carboxymethylcellul-Glycerin (LUBRICATING EYE DROPS OP), Place 1 drop into both eyes daily as needed (dry eyes).   Omega-3 Fatty Acids (OMEGA 3 PO), Take 4,800 mg by mouth daily.   tamsulosin (FLOMAX) 0.4 MG CAPS capsule, Take 0.4 mg by mouth daily.   triamcinolone cream (KENALOG) 0.1 %, Apply 1 Application topically 2 (two) times daily as needed (itchy scalp).   vitamin C (ASCORBIC ACID) 500 MG tablet, Take 1,000 mg by mouth daily.   Reviewed prior external information including notes and imaging from  primary care provider As well as notes that were available from care everywhere and other healthcare systems.  Past medical history, social, surgical and family history all reviewed in electronic medical record.  No pertanent information unless stated regarding to the chief complaint.   Review of Systems:  No headache, visual changes, nausea, vomiting, diarrhea, constipation, dizziness, abdominal pain, skin rash, fevers, chills, night sweats,  weight loss, swollen lymph nodes, body aches, joint swelling, chest pain, shortness of breath, mood changes. POSITIVE muscle aches  Objective  Blood pressure 110/74, pulse 63, height 6\' 2"  (1.88 m), weight 219 lb (99.3 kg), SpO2 96%.   General: No apparent distress alert and oriented x3 mood and affect normal, dressed appropriately.  HEENT: Pupils equal, extraocular movements intact  Respiratory: Patient's speak in full sentences and does not appear short of breath  Cardiovascular: No lower extremity edema, non tender, no erythema  Low back does have some loss of lordosis noted.  Some tenderness to palpation in the paraspinal musculature.  Patient has more pain over the left sacroiliac joint.  Tightness noted in the gluteal area as well but no pain over the right greater trochanteric area.   [id number]; 15 additional minutes  spent for Therapeutic exercises as stated in above notes.  This included exercises focusing on stretching, strengthening, with significant focus on eccentric aspects.   Long term goals include an improvement in range of motion, strength, endurance as well as avoiding reinjury. Patient's frequency would include in 1-2 times a day, 3-5 times a week for a duration of 6-12 weeks. Low back exercises that included:  Pelvic tilt/bracing instruction to focus on control of the pelvic girdle and lower abdominal muscles  Glute strengthening exercises, focusing on proper firing of the glutes without engaging the low back muscles Proper stretching techniques for maximum relief for the hamstrings, hip flexors, low back and some rotation where tolerated   Proper technique shown and discussed handout in great detail with ATC.  All questions were discussed and answered.     Impression and Recommendations:     The above documentation has been reviewed and is accurate and complete Judi Saa, DO

## 2023-05-19 ENCOUNTER — Encounter: Payer: Self-pay | Admitting: Family Medicine

## 2023-05-19 ENCOUNTER — Ambulatory Visit (INDEPENDENT_AMBULATORY_CARE_PROVIDER_SITE_OTHER): Payer: Medicare Other | Admitting: Family Medicine

## 2023-05-19 ENCOUNTER — Ambulatory Visit
Admission: RE | Admit: 2023-05-19 | Discharge: 2023-05-19 | Disposition: A | Discharge status: Home / Self Care | Payer: Medicare Other | Source: Ambulatory Visit | Attending: Family Medicine | Admitting: Family Medicine

## 2023-05-19 VITALS — BP 110/74 | HR 63 | Ht 74.0 in | Wt 219.0 lb

## 2023-05-19 DIAGNOSIS — M47816 Spondylosis without myelopathy or radiculopathy, lumbar region: Secondary | ICD-10-CM | POA: Diagnosis not present

## 2023-05-19 DIAGNOSIS — M5126 Other intervertebral disc displacement, lumbar region: Secondary | ICD-10-CM | POA: Diagnosis not present

## 2023-05-19 DIAGNOSIS — M4317 Spondylolisthesis, lumbosacral region: Secondary | ICD-10-CM | POA: Insufficient documentation

## 2023-05-19 DIAGNOSIS — G8929 Other chronic pain: Secondary | ICD-10-CM | POA: Diagnosis not present

## 2023-05-19 DIAGNOSIS — M5137 Other intervertebral disc degeneration, lumbosacral region with discogenic back pain only: Secondary | ICD-10-CM | POA: Diagnosis not present

## 2023-05-19 DIAGNOSIS — M545 Low back pain, unspecified: Secondary | ICD-10-CM

## 2023-05-19 DIAGNOSIS — M5136 Other intervertebral disc degeneration, lumbar region with discogenic back pain only: Secondary | ICD-10-CM | POA: Diagnosis not present

## 2023-05-19 NOTE — Progress Notes (Signed)
 Remote pacemaker transmission.

## 2023-05-19 NOTE — Patient Instructions (Signed)
Do prescribed exercises at least 3x a week  Change to bike and elliptical only Machine weight no free weight Tart Cherry 2400mg  at night See you again in 7-8 weeks

## 2023-05-19 NOTE — Assessment & Plan Note (Signed)
Patient does have some spondylolisthesis noted.  Fairly severe overall.  Patient does have what appears to be bilateral pars defect noted on his CT scan in 2022.  Discussed with patient this management at home exercises, and which ones to avoid.  Discussed x-rays with patient having a past medical history significant for prostate cancer as well.  Will try conservative therapy with home exercises and patient would like to avoid prescriptions if possible.  Worsening pain advanced imaging would be limited.  Follow-up again in 6 to 8 weeks

## 2023-05-24 ENCOUNTER — Ambulatory Visit: Payer: Medicare Other | Attending: Internal Medicine | Admitting: Internal Medicine

## 2023-05-24 ENCOUNTER — Encounter: Payer: Self-pay | Admitting: Internal Medicine

## 2023-05-24 VITALS — BP 126/84 | HR 84 | Ht 74.0 in | Wt 218.0 lb

## 2023-05-24 DIAGNOSIS — I4821 Permanent atrial fibrillation: Secondary | ICD-10-CM | POA: Diagnosis not present

## 2023-05-24 DIAGNOSIS — Z79899 Other long term (current) drug therapy: Secondary | ICD-10-CM | POA: Diagnosis not present

## 2023-05-24 DIAGNOSIS — Z95810 Presence of automatic (implantable) cardiac defibrillator: Secondary | ICD-10-CM

## 2023-05-24 DIAGNOSIS — I472 Ventricular tachycardia, unspecified: Secondary | ICD-10-CM | POA: Diagnosis not present

## 2023-05-24 DIAGNOSIS — E039 Hypothyroidism, unspecified: Secondary | ICD-10-CM

## 2023-05-24 DIAGNOSIS — I495 Sick sinus syndrome: Secondary | ICD-10-CM

## 2023-05-24 NOTE — Patient Instructions (Signed)
 Medication Instructions:  Your physician recommends that you continue on your current medications as directed. Please refer to the Current Medication list given to you today.  *If you need a refill on your cardiac medications before your next appointment, please call your pharmacy*   Lab Work: TSH, Liver Panel and CBC today If you have labs (blood work) drawn today and your tests are completely normal, you will receive your results only by: MyChart Message (if you have MyChart) OR A paper copy in the mail If you have any lab test that is abnormal or we need to change your treatment, we will call you to review the results.   Testing/Procedures: None ordered.    Follow-Up: At [facility], you and your health needs are our priority.  As part of our continuing mission to provide you with exceptional heart care, we have created designated Provider Care Teams.  These Care Teams include your primary Cardiologist (physician) and Advanced Practice Providers (APPs -  Physician Assistants and Nurse Practitioners) who all work together to provide you with the care you need, when you need it.  We recommend signing up for the patient portal called "MyChart".  Sign up information is provided on this After Visit Summary.  MyChart is used to connect with patients for Virtual Visits (Telemedicine).  Patients are able to view lab/test results, encounter notes, upcoming appointments, etc.  Non-urgent messages can be sent to your provider as well.   To learn more about what you can do with MyChart, go to [email or web address].    Your next appointment:   6 months with Dr Graciela Husbands

## 2023-05-24 NOTE — Progress Notes (Unsigned)
 Patient Care Team: Farris Has, MD as PCP - General (Family Medicine) Mealor, Roberts Gaudy, MD as PCP - Electrophysiology (Cardiology)   HPI  Robert Ochoa is a 78 y.o. 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  Rx with amio for VTwhich was assoc 2013 with afib-now permanent (see below) and also anticoagulation w  Apixaban .no  bleeding   No significant palpitations.  Tolerating amiodarone without cough or sun intolerance  Developed worsening burden of atrial fibrillation/flutter.  Underwent catheter ablation 11/23 with recurrence of the atypical flutter and then recurrence of atrial fibrillation.  5/24 seen again by Dr. Thea Silversmith with a decision made for permanent atrial fibrillation   The patient denies chest pain, shortness of breath, nocturnal dyspnea, orthopnea or peripheral edema.  There have been no palpitations, lightheadedness or syncope.    Has multiple questions regarding ablation    Date Cr K Hgb TSH LFTs  12/22 1.07 4.8 15.9 1.09 (10/22) 19 (10/22)       1.14 (3/23)   4/24 1.28 4.3 16.5              Date Rhythm Treatment  2/13 VT Amio  2/13 Afib Amio   DATE TEST EF   5/13 Echo   50-55% % RVE and hypokinesis  11//10 cMRI 47% RV dilatation and hypokinesis  7/16 Echo  50-55%   11/22 Echo  55% RV function normal      Records and Results Reviewed old records dating back to 2014   Past Medical History:  Diagnosis Date   Arrhythmogenic right ventricular dysplas (HCC)    Arrhythmogenic right ventricular dysplasia (HCC)    Hx of VT s/p dual-chamber St. Jude ICD. Cardiac MRI 2010 showed moderately dilated RV with hypokinesis, LV EF 47%. EF 50-55% 2010 but down to 45% 05/2011. Cath 01/2009 without significant CAD.   Atrial fibrillation or flutter    Paroxysmal. Started on Pradaxa 05/2011.   Cardiac defibrillator in place    LV dysfunction    per echo 10/2011; EF 35%  // Echo 2/18: EF  40-45, diff HK, Gr 1 DD, mild dilated aortic root, mod LAE, mod RVE, mild reduced RVSF, mod RAE   Prostate cancer (HCC)    Septic shock(785.52) 10-27-11 cause unknown   with associated respiratory/renal failure/PAF/bradycardia   Ventricular tachycardia (HCC)    ICD discharges 05/2011 for this & AF-RVR - started on amiodarone then.   Wears glasses     Past Surgical History:  Procedure Laterality Date   APPENDECTOMY  2002   ATRIAL FIBRILLATION ABLATION N/A 02/04/2022   Procedure: ATRIAL FIBRILLATION ABLATION;  Surgeon: Maurice Small, MD;  Location: [facility];  Service: Cardiovascular;  Laterality: N/A;   CARDIAC DEFIBRILLATOR PLACEMENT  02/19/09    St,. Jude    CARDIOVERSION N/A 05/28/2022   Procedure: CARDIOVERSION;  Surgeon: Wendall Stade, MD;  Location: [facility] ENDOSCOPY;  Service: Cardiovascular;  Laterality: N/A;   ICD GENERATOR CHANGEOUT N/A 03/27/2021   Procedure: ICD GENERATOR CHANGEOUT;  Surgeon: Hillis Range, MD;  Location: [facility];  Service: Cardiovascular;  Laterality: N/A;   IMPLANTABLE CARDIOVERTER DEFIBRILLATOR (ICD) GENERATOR CHANGE N/A 03/01/2013   Procedure: ICD GENERATOR CHANGE;  Surgeon: Duke Salvia, MD;  Location: [facility] CATH LAB;  Service: Cardiovascular;  Laterality: N/A;   IMPLANTABLE CARDIOVERTER DEFIBRILLATOR GENERATOR CHANGE  03/01/13   for premature battery depletetion.  Now has a SJM  Ellipse DR device placed by Dr Graciela Husbands   PROSTATE BIOPSY  11/2019   RADIOACTIVE SEED IMPLANT N/A 04/30/2020   Procedure: RADIOACTIVE SEED IMPLANT/BRACHYTHERAPY IMPLANT;  Surgeon: Rene Paci, MD;  Location: [facility];  Service: Urology;  Laterality: N/A;   SPACE OAR INSTILLATION N/A 04/30/2020   Procedure: SPACE OAR INSTILLATION;  Surgeon: Rene Paci, MD;  Location: [facility];  Service: Urology;  Laterality: N/A;    Current Meds  Medication Sig   amiodarone (PACERONE) 100 MG tablet TAKE 1 TABLET(100 MG) BY  MOUTH DAILY   Calcium-Magnesium-Zinc (CAL-MAG-ZINC PO) Take 1 tablet by mouth daily.   Carboxymethylcellul-Glycerin (LUBRICATING EYE DROPS OP) Place 1 drop into both eyes daily as needed (dry eyes).   ELIQUIS 5 MG TABS tablet TAKE 1 TABLET BY MOUTH TWICE DAILY   furosemide (LASIX) 20 MG tablet TAKE 1 TABLET BY MOUTH DAILY AS NEEDED FOR SWELLING   lisinopril (ZESTRIL) 10 MG tablet TAKE 2 TABLETS BY MOUTH EVERY DAY   Omega-3 Fatty Acids (OMEGA 3 PO) Take 1,600 mg by mouth daily.   sildenafil (REVATIO) 20 MG tablet Take 20-100 mg by mouth daily as needed (ED).   tamsulosin (FLOMAX) 0.4 MG CAPS capsule Take 0.4 mg by mouth daily.   triamcinolone cream (KENALOG) 0.1 % Apply 1 Application topically 2 (two) times daily as needed (itchy scalp).   vitamin C (ASCORBIC ACID) 500 MG tablet Take 1,000 mg by mouth daily.    No Known Allergies    Review of Systems negative except from HPI and PMH  Physical Exam BP 126/84   Pulse 84   Ht 6\' 2"  (1.88 m)   Wt 218 lb (98.9 kg)   SpO2 94%   BMI 27.99 kg/m  Well developed and well nourished in no acute distress HENT normal Neck supple with JVP-flat Clear Device pocket well healed; without hematoma or erythema.  There is no tethering  Regular rate and rhythm, no Mr. Vorhees is good and he has not amiodarone gallop No  murmur Abd-soft with active BS No Clubbing cyanosis  edema Skin-warm and dry A & Oriented  Grossly normal sensory and motor function  ECG atrial flutter-typical at 84 -/11/38  Device function is normal. Programming changes change DDDR>> VVIR  See Paceart for details    CrCl cannot be calculated (Patient's most recent lab result is older than the maximum 21 days allowed.).   Assessment and  Plan  VT   Afib  ARVC  Sinus node dysfunction  Amiodarone therapy  Sinus bradycardia   Low voltage with mild LVH  Atrial fibrillation/flutter was designated as permanent.  The patient has multiple questions regarding the  potential benefits of catheter ablation and the role of PFA.  I think it is reasonable for him to have these conversations with somebody.  He will look into his insurance company find out which of the medical centers around here is part of his system and we will go from there.  His ECG today looks like typical atrial flutter.  The episodes of sinus rhythm on his device are almost certainly as was demonstrated today, related to under sensing secondary to blanking  Continue the amiodarone for the ventricular tachycardia.  No associated symptoms.  Will check surveillance laboratories.

## 2023-05-25 DIAGNOSIS — F4322 Adjustment disorder with anxiety: Secondary | ICD-10-CM | POA: Diagnosis not present

## 2023-05-25 LAB — CBC
Hematocrit: 52.2 % — ABNORMAL HIGH (ref 37.5–51.0)
Hemoglobin: 17.1 g/dL (ref 13.0–17.7)
MCH: 29.5 pg (ref 26.6–33.0)
MCHC: 32.8 g/dL (ref 31.5–35.7)
MCV: 90 fL (ref 79–97)
Platelets: 194 10*3/uL (ref 150–450)
RBC: 5.8 x10E6/uL (ref 4.14–5.80)
RDW: 12.8 % (ref 11.6–15.4)
WBC: 4.9 10*3/uL (ref 3.4–10.8)

## 2023-05-25 LAB — TSH: TSH: 1.38 u[IU]/mL (ref 0.450–4.500)

## 2023-05-25 LAB — HEPATIC FUNCTION PANEL
ALT: 21 [IU]/L (ref 0–44)
AST: 26 [IU]/L (ref 0–40)
Albumin: 4.4 g/dL (ref 3.8–4.8)
Alkaline Phosphatase: 88 [IU]/L (ref 44–121)
Bilirubin Total: 0.5 mg/dL (ref 0.0–1.2)
Bilirubin, Direct: 0.2 mg/dL (ref 0.00–0.40)
Total Protein: 7 g/dL (ref 6.0–8.5)

## 2023-05-26 ENCOUNTER — Encounter: Payer: Self-pay | Admitting: Internal Medicine

## 2023-05-26 LAB — CUP PACEART INCLINIC DEVICE CHECK
Battery Remaining Longevity: 63 mo
Brady Statistic RA Percent Paced: 2.4 %
Brady Statistic RV Percent Paced: 61 %
Date Time Interrogation Session: 20250224151500
HighPow Impedance: 55.0908
Implantable Lead Connection Status: 753985
Implantable Lead Connection Status: 753985
Implantable Lead Implant Date: 20101123
Implantable Lead Implant Date: 20101123
Implantable Lead Location: 753859
Implantable Lead Location: 753860
Implantable Lead Model: 7121
Implantable Pulse Generator Implant Date: 20201229
Lead Channel Impedance Value: 387.5 Ohm
Lead Channel Impedance Value: 475 Ohm
Lead Channel Pacing Threshold Amplitude: 1.25 V
Lead Channel Pacing Threshold Amplitude: 1.25 V
Lead Channel Pacing Threshold Pulse Width: 0.6 ms
Lead Channel Pacing Threshold Pulse Width: 0.6 ms
Lead Channel Sensing Intrinsic Amplitude: 10.3 mV
Lead Channel Sensing Intrinsic Amplitude: 3.6 mV
Lead Channel Setting Pacing Amplitude: 2.5 V
Lead Channel Setting Pacing Pulse Width: 0.6 ms
Lead Channel Setting Sensing Sensitivity: 0.5 mV
Pulse Gen Serial Number: 8907499

## 2023-05-28 ENCOUNTER — Encounter: Payer: Self-pay | Admitting: Internal Medicine

## 2023-05-28 DIAGNOSIS — I4891 Unspecified atrial fibrillation: Secondary | ICD-10-CM

## 2023-06-03 ENCOUNTER — Encounter: Payer: Self-pay | Admitting: Family Medicine

## 2023-06-08 DIAGNOSIS — F4322 Adjustment disorder with anxiety: Secondary | ICD-10-CM | POA: Diagnosis not present

## 2023-06-21 DIAGNOSIS — C61 Malignant neoplasm of prostate: Secondary | ICD-10-CM | POA: Diagnosis not present

## 2023-06-21 DIAGNOSIS — Z8546 Personal history of malignant neoplasm of prostate: Secondary | ICD-10-CM | POA: Diagnosis not present

## 2023-06-22 DIAGNOSIS — F4322 Adjustment disorder with anxiety: Secondary | ICD-10-CM | POA: Diagnosis not present

## 2023-06-27 ENCOUNTER — Emergency Department (HOSPITAL_COMMUNITY)
Admission: EM | Admit: 2023-06-27 | Discharge: 2023-06-27 | Disposition: A | Discharge status: Home / Self Care | Attending: Emergency Medicine | Admitting: Emergency Medicine

## 2023-06-27 ENCOUNTER — Emergency Department (HOSPITAL_COMMUNITY)

## 2023-06-27 DIAGNOSIS — W19XXXA Unspecified fall, initial encounter: Secondary | ICD-10-CM | POA: Insufficient documentation

## 2023-06-27 DIAGNOSIS — S0081XA Abrasion of other part of head, initial encounter: Secondary | ICD-10-CM | POA: Insufficient documentation

## 2023-06-27 DIAGNOSIS — Z79899 Other long term (current) drug therapy: Secondary | ICD-10-CM | POA: Diagnosis not present

## 2023-06-27 DIAGNOSIS — E041 Nontoxic single thyroid nodule: Secondary | ICD-10-CM | POA: Diagnosis not present

## 2023-06-27 DIAGNOSIS — Z7901 Long term (current) use of anticoagulants: Secondary | ICD-10-CM | POA: Insufficient documentation

## 2023-06-27 DIAGNOSIS — Z043 Encounter for examination and observation following other accident: Secondary | ICD-10-CM | POA: Diagnosis not present

## 2023-06-27 DIAGNOSIS — T1490XA Injury, unspecified, initial encounter: Secondary | ICD-10-CM | POA: Diagnosis not present

## 2023-06-27 DIAGNOSIS — S50312A Abrasion of left elbow, initial encounter: Secondary | ICD-10-CM | POA: Insufficient documentation

## 2023-06-27 DIAGNOSIS — I6782 Cerebral ischemia: Secondary | ICD-10-CM | POA: Diagnosis not present

## 2023-06-27 DIAGNOSIS — S0990XA Unspecified injury of head, initial encounter: Secondary | ICD-10-CM | POA: Diagnosis not present

## 2023-06-27 DIAGNOSIS — S199XXA Unspecified injury of neck, initial encounter: Secondary | ICD-10-CM | POA: Diagnosis not present

## 2023-06-27 DIAGNOSIS — Z95810 Presence of automatic (implantable) cardiac defibrillator: Secondary | ICD-10-CM | POA: Diagnosis not present

## 2023-06-27 DIAGNOSIS — M160 Bilateral primary osteoarthritis of hip: Secondary | ICD-10-CM | POA: Diagnosis not present

## 2023-06-27 DIAGNOSIS — R69 Illness, unspecified: Secondary | ICD-10-CM | POA: Diagnosis not present

## 2023-06-27 DIAGNOSIS — M47812 Spondylosis without myelopathy or radiculopathy, cervical region: Secondary | ICD-10-CM | POA: Diagnosis not present

## 2023-06-27 DIAGNOSIS — S80212A Abrasion, left knee, initial encounter: Secondary | ICD-10-CM | POA: Diagnosis not present

## 2023-06-27 MED ORDER — ACETAMINOPHEN 500 MG PO TABS
1000.0000 mg | ORAL_TABLET | Freq: Once | ORAL | Status: AC
Start: 2023-06-27 — End: 2023-06-27
  Administered 2023-06-27: 1000 mg via ORAL
  Filled 2023-06-27: qty 2

## 2023-06-27 NOTE — ED Notes (Signed)
 Patient verbalized understanding of discharge instructions, given opportunity to ask questions, patient did not have any concerns at this time, Patient wheeled out to ED lobby by tech, patient will wait in lobby for daughter to pick him up.

## 2023-06-27 NOTE — Discharge Instructions (Addendum)
 Return for any problem.  ?

## 2023-06-27 NOTE — ED Triage Notes (Addendum)
 BIB [facility], patient was on his way to the car after exercising, He made it almost to his car when his hip gave out on him and he fell. Patient fell and hit his let side, no LOC, on blood thinners, patient is A/Ox4, discomfort in hip and back but no pain.

## 2023-06-27 NOTE — Progress Notes (Signed)
 Orthopedic Tech Progress Note Patient Details:  Robert Ochoa [birth year] [id number]  Level II, ortho tech not needed at this time  Patient ID: Robert Ochoa, male   DOB: 30-Apr-1945, 78 y.o.   MRN: [id number]  Robert Ochoa 06/27/2023, 3:47 PM

## 2023-06-27 NOTE — ED Provider Notes (Signed)
 [place] EMERGENCY DEPARTMENT AT [facility] Provider Note   CSN: [id number] Arrival date & time: 06/27/23  1504     History {Add pertinent medical, surgical, social history, OB history to HPI:1} Chief Complaint  Patient presents with   Robert Ochoa is a 78 y.o. male.   Fall       Home Medications Prior to Admission medications   Medication Sig Start Date End Date Taking? Authorizing Provider  amiodarone (PACERONE) 100 MG tablet TAKE 1 TABLET(100 MG) BY MOUTH DAILY 05/07/23   Mealor, Roberts Gaudy, MD  Calcium-Magnesium-Zinc (CAL-MAG-ZINC PO) Take 1 tablet by mouth daily.    [provider]  Carboxymethylcellul-Glycerin (LUBRICATING EYE DROPS OP) Place 1 drop into both eyes daily as needed (dry eyes).    [provider]  ELIQUIS 5 MG TABS tablet TAKE 1 TABLET BY MOUTH TWICE DAILY 01/04/23   Mealor, Roberts Gaudy, MD  furosemide (LASIX) 20 MG tablet TAKE 1 TABLET BY MOUTH DAILY AS NEEDED FOR SWELLING 03/22/23   Mealor, Roberts Gaudy, MD  lisinopril (ZESTRIL) 10 MG tablet TAKE 2 TABLETS BY MOUTH EVERY DAY 10/02/22   Duke Salvia, MD  Omega-3 Fatty Acids (OMEGA 3 PO) Take 1,600 mg by mouth daily.    [provider]  sildenafil (REVATIO) 20 MG tablet Take 20-100 mg by mouth daily as needed (ED).    [provider]  tamsulosin (FLOMAX) 0.4 MG CAPS capsule Take 0.4 mg by mouth daily. 05/16/20   [provider]  triamcinolone cream (KENALOG) 0.1 % Apply 1 Application topically 2 (two) times daily as needed (itchy scalp). 04/15/22   [provider]  vitamin C (ASCORBIC ACID) 500 MG tablet Take 1,000 mg by mouth daily.    [provider]      Allergies    Patient has no known allergies.    Review of Systems   Review of Systems  Physical Exam Updated Vital Signs BP 104/81   Pulse 84   Temp 99 F (37.2 C)   Resp (!) 21   Ht 6\' 2"  (1.88 m)   Wt 95.3 kg   SpO2 94%   BMI 26.96 kg/m  Physical Exam  ED  Results / Procedures / Treatments   Labs (all labs ordered are listed, but only abnormal results are displayed) Labs Reviewed - No data to display  EKG None  Radiology CT Cervical Spine Wo Contrast Result Date: 06/27/2023 CLINICAL DATA:  Neck trauma (Age >= 65y) EXAM: CT CERVICAL SPINE WITHOUT CONTRAST TECHNIQUE: Multidetector CT imaging of the cervical spine was performed without intravenous contrast. Multiplanar CT image reconstructions were also generated. RADIATION DOSE REDUCTION: This exam was performed according to the departmental dose-optimization program which includes automated exposure control, adjustment of the mA and/or kV according to patient size and/or use of iterative reconstruction technique. COMPARISON:  10/30/2011. FINDINGS: Alignment: Facet joints are aligned without dislocation or traumatic listhesis. Dens and lateral masses are aligned. Skull base and vertebrae: No acute fracture. No primary bone lesion or focal pathologic process. Soft tissues and spinal canal: No prevertebral fluid or swelling. No visible canal hematoma. Disc levels:  Relatively mild cervical spondylosis. Upper chest: Included lung apices are clear. Other: 2.2 cm right thyroid lobe nodule, stable since 2013 and compatible with a benign etiology. IMPRESSION: No acute fracture or traumatic listhesis of the cervical spine. Electronically Signed   By: Duanne Guess D.O.   On: 06/27/2023 16:43   CT Head Wo Contrast Result  Date: 06/27/2023 CLINICAL DATA:  Head trauma, minor (Age >= 65y) EXAM: CT HEAD WITHOUT CONTRAST TECHNIQUE: Contiguous axial images were obtained from the base of the skull through the vertex without intravenous contrast. RADIATION DOSE REDUCTION: This exam was performed according to the departmental dose-optimization program which includes automated exposure control, adjustment of the mA and/or kV according to patient size and/or use of iterative reconstruction technique. COMPARISON:  None  Available. FINDINGS: Brain: No evidence of acute infarction, hemorrhage, hydrocephalus, extra-axial collection or mass lesion/mass effect. Mild low-density changes within the periventricular and subcortical white matter most compatible with chronic microvascular ischemic change. Mild diffuse cerebral volume loss. Vascular: No hyperdense vessel or unexpected calcification. Skull: Normal. Negative for fracture or focal lesion. Sinuses/Orbits: No acute finding. Other: Negative for scalp hematoma. IMPRESSION: 1. No acute intracranial abnormality. 2. Mild chronic microvascular ischemic change and cerebral volume loss. Electronically Signed   By: Duanne Guess D.O.   On: 06/27/2023 16:40   DG Pelvis Portable Result Date: 06/27/2023 CLINICAL DATA:  Larey Seat, anticoagulated EXAM: PORTABLE PELVIS 1-2 VIEWS COMPARISON:  None Available. FINDINGS: Supine frontal view of the pelvis includes both hips. No acute fracture, subluxation, or dislocation. Mild symmetrical bilateral hip osteoarthritis. Sacroiliac joints are unremarkable. Brachytherapy seeds within the prostate. IMPRESSION: 1. No acute displaced fracture. 2. Mild symmetrical bilateral hip osteoarthritis. Electronically Signed   By: Sharlet Salina M.D.   On: 06/27/2023 15:45   DG Chest Portable 1 View Result Date: 06/27/2023 CLINICAL DATA:  Larey Seat, anticoagulated EXAM: PORTABLE CHEST 1 VIEW COMPARISON:  04/09/2020 FINDINGS: Three frontal views of the chest are obtained. Dual lead pacer/AICD unchanged. Cardiac silhouette is unremarkable. No acute airspace disease, effusion, or pneumothorax. No acute displaced fracture. IMPRESSION: 1. No acute intrathoracic process. Electronically Signed   By: Sharlet Salina M.D.   On: 06/27/2023 15:44    Procedures Procedures  {Document cardiac monitor, telemetry assessment procedure when appropriate:1}  Medications Ordered in ED Medications  acetaminophen (TYLENOL) tablet 1,000 mg (1,000 mg Oral Given 06/27/23 1539)    ED  Course/ Medical Decision Making/ A&P   {   Click here for ABCD2, HEART and other calculatorsREFRESH Note before signing :1}                              Medical Decision Making Amount and/or Complexity of Data Reviewed Radiology: ordered.  Risk OTC drugs.   ***  {Document critical care time when appropriate:1} {Document review of labs and clinical decision tools ie heart score, Chads2Vasc2 etc:1}  {Document your independent review of radiology images, and any outside records:1} {Document your discussion with family members, caretakers, and with consultants:1} {Document social determinants of health affecting pt's care:1} {Document your decision making why or why not admission, treatments were needed:1} Final Clinical Impression(s) / ED Diagnoses Final diagnoses:  None    Rx / DC Orders ED Discharge Orders     None

## 2023-06-28 ENCOUNTER — Other Ambulatory Visit: Payer: Self-pay | Admitting: Cardiovascular Disease

## 2023-06-28 DIAGNOSIS — C61 Malignant neoplasm of prostate: Secondary | ICD-10-CM | POA: Diagnosis not present

## 2023-06-28 DIAGNOSIS — I480 Paroxysmal atrial fibrillation: Secondary | ICD-10-CM

## 2023-06-28 NOTE — Telephone Encounter (Signed)
 Eliquis 5mg  refill request received. Patient is 78 years old, weight-95.3kg, Crea-1.28 on 07/06/22, Diagnosis-Afib, and last seen by Dr. Graciela Husbands on 05/24/23. Dose is appropriate based on dosing criteria. Will send in refill to requested pharmacy.

## 2023-06-29 ENCOUNTER — Encounter: Payer: Self-pay | Admitting: Family Medicine

## 2023-06-29 NOTE — Telephone Encounter (Signed)
Forwarding to Dr. Katrinka Blazing to review and advise.

## 2023-07-06 DIAGNOSIS — F4322 Adjustment disorder with anxiety: Secondary | ICD-10-CM | POA: Diagnosis not present

## 2023-07-07 ENCOUNTER — Ambulatory Visit (INDEPENDENT_AMBULATORY_CARE_PROVIDER_SITE_OTHER): Payer: Medicare Other

## 2023-07-07 DIAGNOSIS — I428 Other cardiomyopathies: Secondary | ICD-10-CM

## 2023-07-08 LAB — CUP PACEART REMOTE DEVICE CHECK
Battery Remaining Longevity: 60 mo
Battery Remaining Percentage: 69 %
Battery Voltage: 2.98 V
Brady Statistic RV Percent Paced: 69 %
Date Time Interrogation Session: 20250409020016
HighPow Impedance: 53 Ohm
HighPow Impedance: 53 Ohm
Implantable Lead Connection Status: 753985
Implantable Lead Connection Status: 753985
Implantable Lead Implant Date: 20101123
Implantable Lead Implant Date: 20101123
Implantable Lead Location: 753859
Implantable Lead Location: 753860
Implantable Lead Model: 7121
Implantable Pulse Generator Implant Date: 20201229
Lead Channel Impedance Value: 350 Ohm
Lead Channel Impedance Value: 440 Ohm
Lead Channel Pacing Threshold Amplitude: 1.25 V
Lead Channel Pacing Threshold Pulse Width: 0.6 ms
Lead Channel Sensing Intrinsic Amplitude: 3.5 mV
Lead Channel Sensing Intrinsic Amplitude: 8 mV
Lead Channel Setting Pacing Amplitude: 2.5 V
Lead Channel Setting Pacing Pulse Width: 0.6 ms
Lead Channel Setting Sensing Sensitivity: 0.5 mV
Pulse Gen Serial Number: 8907499

## 2023-07-08 NOTE — Progress Notes (Unsigned)
 [facility] [address] [place] [address] Phone: [phone] Subjective:   Robert Ochoa, am serving as a scribe for Dr. Ronnell Coins.  I'm seeing this patient by the request  of:  Robert Coho, MD  CC: low back pain follow up   [id number]  05/19/2023 Patient does have some spondylolisthesis noted.  Fairly severe overall.  Patient does have what appears to be bilateral pars defect noted on his CT scan in 2022.  Discussed with patient this management at home exercises, and which ones to avoid.  Discussed x-rays with patient having a past medical history significant for prostate cancer as well.  Will try conservative therapy with home exercises and patient would like to avoid prescriptions if possible.  Worsening pain advanced imaging would be limited.  Follow-up again in 6 to 8 weeks     Update 07/19/2023 Robert Ochoa is a 78 y.o. male coming in with complaint of lumbar spine pain. Patient states worse since last visit. Talk more in depth about message sent in April. The frequency of the pain has increased. Pain moves around, but never goes away.  Robert Ochoa and seen in ED since we have seen him   Xray of lumbar did show grade 2 spondy at L5/S1  Past Medical History:  Diagnosis Date   Arrhythmogenic right ventricular dysplas [facility])    Arrhythmogenic right ventricular dysplasia (HCC)    Hx of VT s/p dual-chamber St. Jude ICD. Cardiac MRI 2010 showed moderately dilated RV with hypokinesis, LV EF 47%. EF 50-55% 2010 but down to 45% 05/2011. Cath 01/2009 without significant CAD.   Atrial fibrillation or flutter    Paroxysmal. Started on Pradaxa  05/2011.   Cardiac defibrillator in place    LV dysfunction    per echo 10/2011; EF 35%  // Echo 2/18: EF 40-45, diff HK, Gr 1 DD, mild dilated aortic root, mod LAE, mod RVE, mild reduced RVSF, mod RAE   Prostate cancer (HCC)    Septic shock(785.52) 10-27-11 cause unknown   with associated respiratory/renal  failure/PAF/bradycardia   Ventricular tachycardia (HCC)    ICD discharges 05/2011 for this & AF-RVR - started on amiodarone  then.   Wears glasses    Past Surgical History:  Procedure Laterality Date   APPENDECTOMY  2002   ATRIAL FIBRILLATION ABLATION N/A 02/04/2022   Procedure: ATRIAL FIBRILLATION ABLATION;  Surgeon: Efraim Grange, MD;  Location: [facility] INVASIVE CV LAB;  Service: Cardiovascular;  Laterality: N/A;   CARDIAC DEFIBRILLATOR PLACEMENT  02/19/09    St,. Jude    CARDIOVERSION N/A 05/28/2022   Procedure: CARDIOVERSION;  Surgeon: Loyde Rule, MD;  Location: [facility] ENDOSCOPY;  Service: Cardiovascular;  Laterality: N/A;   ICD GENERATOR CHANGEOUT N/A 03/27/2021   Procedure: ICD GENERATOR CHANGEOUT;  Surgeon: Jolly Needle, MD;  Location: [facility]VASIVE CV LAB;  Service: Cardiovascular;  Laterality: N/A;   IMPLANTABLE CARDIOVERTER DEFIBRILLATOR (ICD) GENERATOR CHANGE N/A 03/01/2013   Procedure: ICD GENERATOR CHANGE;  Surgeon: Verona Goodwill, MD;  Location: [facility] CATH LAB;  Service: Cardiovascular;  Laterality: N/A;   IMPLANTABLE CARDIOVERTER DEFIBRILLATOR GENERATOR CHANGE  03/01/13   for premature battery depletetion.  Now has a SJM Ellipse DR device placed by Dr Rodolfo Clan   PROSTATE BIOPSY  11/2019   RADIOACTIVE SEED IMPLANT N/A 04/30/2020   Procedure: RADIOACTIVE SEED IMPLANT/BRACHYTHERAPY IMPLANT;  Surgeon: Adelbert Homans, MD;  Location: [facility];  Service: Urology;  Laterality: N/A;   SPACE OAR INSTILLATION N/A 04/30/2020  Procedure: SPACE OAR INSTILLATION;  Surgeon: Adelbert Homans, MD;  Location: [facility];  Service: Urology;  Laterality: N/A;   Social History   Socioeconomic History   Marital status: Single    Spouse name: Not on file   Number of children: 1   Years of education: Not on file   Highest education level: Not on file  Occupational History    Employer: [employer]    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 Self employed.    Social Drivers of Corporate investment banker Strain: Not on file  Food Insecurity: Not on file  Transportation Needs: Not on file  Physical Activity: Not on file  Stress: Not on file  Social Connections: Not on file   No Known Allergies Family History  Problem Relation Age of Onset   Heart attack Father    Allergies Mother    Cancer Paternal Aunt        type unknown   Prostate cancer Cousin    Colon cancer Neg Hx    Pancreatic cancer Neg Hx    Breast cancer Neg Hx      Current Outpatient Medications (Cardiovascular):    amiodarone  (PACERONE ) 100 MG tablet, TAKE 1 TABLET(100 MG) BY MOUTH DAILY   furosemide  (LASIX ) 20 MG tablet, TAKE 1 TABLET BY MOUTH DAILY AS NEEDED FOR SWELLING   lisinopril  (ZESTRIL ) 10 MG tablet, TAKE 2 TABLETS BY MOUTH EVERY DAY   sildenafil  (REVATIO ) 20 MG tablet, Take 20-100 mg by mouth daily as needed (ED).    Current Outpatient Medications (Hematological):    ELIQUIS  5 MG TABS tablet, TAKE 1 TABLET BY MOUTH TWICE DAILY  Current Outpatient Medications (Other):    Calcium-Magnesium -Zinc (CAL-MAG-ZINC PO), Take 1 tablet by mouth daily.   Carboxymethylcellul-Glycerin (LUBRICATING EYE DROPS OP), Place 1 drop into both eyes daily as needed (dry eyes).   Omega-3 Fatty Acids (OMEGA 3 PO), Take 1,600 mg by mouth daily.   tamsulosin (FLOMAX) 0.4 MG CAPS capsule, Take 0.4 mg by mouth daily.   triamcinolone cream (KENALOG) 0.1 %, Apply 1 Application topically 2 (two) times daily as  needed (itchy scalp).   vitamin C (ASCORBIC ACID) 500 MG tablet, Take 1,000 mg by mouth daily.   Reviewed prior external information including notes and imaging from  primary care provider As well as notes that were available from care everywhere and other healthcare systems.  Past medical history, social, surgical and family history all reviewed in electronic medical record.  No pertanent information unless stated regarding to the chief complaint.   Review of Systems:  No headache, visual changes, nausea, vomiting, diarrhea, constipation, dizziness, abdominal pain, skin rash, fevers, chills, night sweats, weight loss, swollen lymph nodes, body aches, joint swelling, chest pain, shortness of breath, mood changes. POSITIVE muscle aches  Objective  Blood pressure 108/82, pulse 91, height 6\' 2"  (1.88 m), weight 217 lb (98.4 kg),  SpO2 95%.   General: No apparent distress alert and oriented x3 mood and affect normal, dressed appropriately.  HEENT: Pupils equal, extraocular movements intact  Respiratory: Patient's speak in full sentences and does not appear short of breath  Cardiovascular: No lower extremity edema, non tender, no erythema  Low back exam shows patient does have some loss lordosis.  Worsening pain with significant extension and flexion of the back noted.  Patient does have tightness of the lower extremities.    Impression and Recommendations:     The above documentation has been reviewed and is accurate and complete Ayo Guarino M Azaiah Licciardi, DO

## 2023-07-17 ENCOUNTER — Encounter: Payer: Self-pay | Admitting: Internal Medicine

## 2023-07-19 ENCOUNTER — Encounter: Payer: Self-pay | Admitting: Family Medicine

## 2023-07-19 ENCOUNTER — Ambulatory Visit (INDEPENDENT_AMBULATORY_CARE_PROVIDER_SITE_OTHER): Payer: Medicare Other | Admitting: Family Medicine

## 2023-07-19 VITALS — BP 108/82 | HR 91 | Ht 74.0 in | Wt 217.0 lb

## 2023-07-19 DIAGNOSIS — M545 Low back pain, unspecified: Secondary | ICD-10-CM

## 2023-07-19 DIAGNOSIS — M4317 Spondylolisthesis, lumbosacral region: Secondary | ICD-10-CM

## 2023-07-19 NOTE — Assessment & Plan Note (Addendum)
 Patient is having signs and symptoms consistent with some spinal stenosis and is having weakness in the leg where patient even fell.  Do feel at this point with it affecting daily activities and not allowing him to be active secondary to the discomfort and pain I do want to consider getting a MRI of the lumbar spine.  Unfortunately patient does have a pacemaker so we will do a CT myelogram.  Patient is on Eliquis  but should do relatively well with myelogram I would anticipate.  Depending on findings we will discuss if patient is a candidate for potential epidurals.  Discussed icing regimen and home exercises.  Follow-up again in 6 to 8 weeks otherwise.

## 2023-07-19 NOTE — Patient Instructions (Addendum)
[facility] [phone] We will be in touch

## 2023-07-20 DIAGNOSIS — F4322 Adjustment disorder with anxiety: Secondary | ICD-10-CM | POA: Diagnosis not present

## 2023-07-28 ENCOUNTER — Encounter: Payer: Self-pay | Admitting: Internal Medicine

## 2023-08-03 ENCOUNTER — Other Ambulatory Visit

## 2023-08-03 ENCOUNTER — Inpatient Hospital Stay: Admission: RE | Admit: 2023-08-03 | Source: Ambulatory Visit

## 2023-08-03 DIAGNOSIS — F4322 Adjustment disorder with anxiety: Secondary | ICD-10-CM | POA: Diagnosis not present

## 2023-08-03 NOTE — Discharge Instructions (Signed)

## 2023-08-04 ENCOUNTER — Ambulatory Visit
Admission: RE | Admit: 2023-08-04 | Discharge: 2023-08-04 | Discharge status: Home / Self Care | Source: Ambulatory Visit | Attending: Family Medicine | Admitting: Family Medicine

## 2023-08-04 ENCOUNTER — Encounter: Payer: Self-pay | Admitting: Family Medicine

## 2023-08-04 DIAGNOSIS — M48061 Spinal stenosis, lumbar region without neurogenic claudication: Secondary | ICD-10-CM | POA: Diagnosis not present

## 2023-08-04 DIAGNOSIS — M545 Low back pain, unspecified: Secondary | ICD-10-CM

## 2023-08-04 MED ORDER — DIAZEPAM 5 MG PO TABS: 5.0000 mg | ORAL_TABLET | Freq: Once | ORAL | Status: DC

## 2023-08-04 MED ORDER — ONDANSETRON HCL 4 MG/2ML IJ SOLN: 4.0000 mg | Freq: Once | INTRAMUSCULAR | Status: DC | PRN

## 2023-08-04 MED ORDER — MEPERIDINE HCL 50 MG/ML IJ SOLN: 50.0000 mg | Freq: Once | INTRAMUSCULAR | Status: DC | PRN

## 2023-08-04 MED ORDER — IOPAMIDOL (ISOVUE-M 200) INJECTION 41%
20.0000 mL | Freq: Once | INTRAMUSCULAR | Status: AC
  Administered 2023-08-04: 20 mL via INTRATHECAL

## 2023-08-05 ENCOUNTER — Telehealth: Payer: Self-pay

## 2023-08-05 ENCOUNTER — Other Ambulatory Visit: Payer: Self-pay

## 2023-08-05 DIAGNOSIS — Z01818 Encounter for other preprocedural examination: Secondary | ICD-10-CM

## 2023-08-05 DIAGNOSIS — M5416 Radiculopathy, lumbar region: Secondary | ICD-10-CM

## 2023-08-05 NOTE — Telephone Encounter (Signed)
   Pre-operative Risk Assessment    Patient Name: Robert Ochoa  DOB: [birth year] MRN: [id number]   Date of last office visit: 05/24/2023 with Dr. Rodolfo Clan Date of next office visit: None   Request for Surgical Clearance    Procedure:  Lumbar Epidural Injection  Date of Surgery:  Clearance TBD                                Surgeon:  Not listed Surgeon's Group or Practice Name:  [facility]  Phone number:  IR: [phone] Spine: [phone] Fax number:  IR: [phone] Spine: [phone]   Type of Clearance Requested:   - Medical  - Pharmacy:  Hold Apixaban  (Eliquis ) for 4 doses   Type of Anesthesia:  Not Indicated   Additional requests/questions:     Kenny Peals   08/05/2023, 1:49 PM

## 2023-08-05 NOTE — Telephone Encounter (Signed)
 Spoke with scheduler regarding patient's referral to Dr Phillis Breaker per Dr Rodolfo Clan.  Scheduler states she will contact Robert Ochoa to schedule appointment.

## 2023-08-09 NOTE — Telephone Encounter (Signed)
 Please advise holding Eliquis prior to lumbar ESI.  Thank you!  DW

## 2023-08-10 ENCOUNTER — Encounter: Payer: Self-pay | Admitting: Internal Medicine

## 2023-08-10 NOTE — Telephone Encounter (Signed)
 Making request for Eliquis  hold recommendations a higher priority at patient's request. Please advise holding Eliquis  prior to lumbar ESI  Thank you!  DW

## 2023-08-10 NOTE — Telephone Encounter (Signed)
 I will send this to the preop APP for review

## 2023-08-11 ENCOUNTER — Telehealth: Payer: Self-pay | Admitting: *Deleted

## 2023-08-11 NOTE — Addendum Note (Signed)
 Addended by: Ophelia Billet on: 08/11/2023 04:43 PM   Modules accepted: Orders

## 2023-08-11 NOTE — Telephone Encounter (Signed)
 Pt has been scheduled tele preop appt 08/17/23. Pt will get BMP 08/12/23 at [facility]. Med rec and consent are done. Pt has been scheduled tele preop appt 08/17/23. Pt will get BMP 08/12/23 at [facility]. Med rec and consent are done.        Patient Consent for Virtual Visit        [name] has provided verbal consent on 08/11/2023 for a virtual visit (video or telephone).   CONSENT FOR VIRTUAL VISIT FOR:  Robert Ochoa  By participating in this virtual visit I agree to the following:  I hereby voluntarily request, consent and authorize [facility] and its employed or contracted physicians, physician assistants, nurse practitioners or other licensed health care professionals (the Practitioner), to provide me with telemedicine health care services (the "Services") as deemed necessary by the treating Practitioner. I acknowledge and consent to receive the Services by the Practitioner via telemedicine. I understand that the telemedicine visit will involve communicating with the Practitioner through live audiovisual communication technology and the disclosure of certain medical information by electronic transmission. I acknowledge that I have been given the opportunity to request an in-person assessment or other available alternative prior to the telemedicine visit and am voluntarily participating in the telemedicine visit.  I understand that I have the right to withhold or withdraw my consent to the use of telemedicine in the course of my care at any time, without affecting my right to future care or treatment, and that the Practitioner or I may terminate the telemedicine visit at any time. I understand that I have the right to inspect all information obtained and/or recorded in the course of the telemedicine visit and may receive copies of available information for a reasonable fee.  I understand that some of the potential risks of receiving the Services via telemedicine include:  Delay or  interruption in medical evaluation due to technological equipment failure or disruption; Information transmitted may not be sufficient (e.g. poor resolution of images) to allow for appropriate medical decision making by the Practitioner; and/or  In rare instances, security protocols could fail, causing a breach of personal health information.  Furthermore, I acknowledge that it is my responsibility to provide information about my medical history, conditions and care that is complete and accurate to the best of my ability. I acknowledge that Practitioner's advice, recommendations, and/or decision may be based on factors not within their control, such as incomplete or inaccurate data provided by me or distortions of diagnostic images or specimens that may result from electronic transmissions. I understand that the practice of medicine is not an exact science and that Practitioner makes no warranties or guarantees regarding treatment outcomes. I acknowledge that a copy of this consent can be made available to me via my patient portal [facility] MyChart), or I can request a printed copy by calling the office of [facility].    I understand that my insurance will be billed for this visit.   I have read or had this consent read to me. I understand the contents of this consent, which adequately explains the benefits and risks of the Services being provided via telemedicine.  I have been provided ample opportunity to ask questions regarding this consent and the Services and have had my questions answered to my satisfaction. I give my informed consent for the services to be provided through the use of telemedicine in my medical care

## 2023-08-11 NOTE — Telephone Encounter (Signed)
 Pt has been scheduled tele preop appt 08/17/23. Pt will get BMP 08/12/23 at [facility]. Med rec and consent are done.

## 2023-08-11 NOTE — Telephone Encounter (Signed)
 Patient with diagnosis of afib on Eliquis  for anticoagulation.    Procedure: Lumbar Epidural Injection  Date of procedure: TBD   CHA2DS2-VASc Score = 4   This indicates a 4.8% annual risk of stroke. The patient's score is based upon: CHF History: 1 HTN History: 1 Diabetes History: 0 Stroke History: 0 Vascular Disease History: 0 Age Score: 2 Gender Score: 0      CrCl  Platelet count 194  Patient has not had an Afib/aflutter ablation within the last 3 months or DCCV within the last 30 days  Need updated BMP  Per office protocol, patient can hold  for  days prior to procedure.    **This guidance is not considered finalized until pre-operative APP has relayed final recommendations.**

## 2023-08-12 DIAGNOSIS — Z01818 Encounter for other preprocedural examination: Secondary | ICD-10-CM | POA: Diagnosis not present

## 2023-08-12 LAB — BASIC METABOLIC PANEL WITH GFR
BUN/Creatinine Ratio: 16 (ref 10–24)
BUN: 19 mg/dL (ref 8–27)
CO2: 20 mmol/L (ref 20–29)
Calcium: 9.2 mg/dL (ref 8.6–10.2)
Chloride: 103 mmol/L (ref 96–106)
Creatinine, Ser: 1.17 mg/dL (ref 0.76–1.27)
Glucose: 98 mg/dL (ref 70–99)
Potassium: 4.5 mmol/L (ref 3.5–5.2)
Sodium: 139 mmol/L (ref 134–144)
eGFR: 64 mL/min/{1.73_m2} (ref >59)

## 2023-08-16 ENCOUNTER — Ambulatory Visit: Payer: Self-pay | Admitting: Pharmacist

## 2023-08-16 NOTE — Telephone Encounter (Signed)
 Patient with diagnosis of afib on Eliquis  for anticoagulation.     Procedure: Lumbar Epidural Injection  Date of procedure: TBD     CHA2DS2-VASc Score = 4   This indicates a 4.8% annual risk of stroke. The patient's score is based upon: CHF History: 1 HTN History: 1 Diabetes History: 0 Stroke History: 0 Vascular Disease History: 0 Age Score: 2 Gender Score: 0       CrCl 78 ml/min Platelet count 194   Patient has not had an Afib/aflutter ablation within the last 3 months or DCCV within the last 30 days   Per office protocol, patient can hold Eliquis  for 3 days prior to procedure.     **This guidance is not considered finalized until pre-operative APP has relayed final recommendations.**

## 2023-08-17 ENCOUNTER — Ambulatory Visit: Attending: Cardiovascular Disease | Admitting: Emergency Medicine

## 2023-08-17 DIAGNOSIS — F4322 Adjustment disorder with anxiety: Secondary | ICD-10-CM | POA: Diagnosis not present

## 2023-08-17 DIAGNOSIS — Z01810 Encounter for preprocedural cardiovascular examination: Secondary | ICD-10-CM | POA: Diagnosis not present

## 2023-08-17 NOTE — Progress Notes (Signed)
 Virtual Visit via Telephone Note   Because of Robert Ochoa co-morbid illnesses, he is at least at moderate risk for complications without adequate follow up.  This format is felt to be most appropriate for this patient at this time.  Due to technical limitations with video connection (technology), today's appointment will be conducted as an audio only telehealth visit, and Robert Ochoa verbally agreed to proceed in this manner.   All issues noted in this document were discussed and addressed.  No physical exam could be performed with this format.  Evaluation Performed:  Preoperative cardiovascular risk assessment _____________   Date:  08/17/2023   Patient ID:  Robert Ochoa, DOB [birth year], MRN [id number] Patient Location:  Home Provider location:   Office  Primary Care Provider:  Ronna Coho, MD Primary Cardiologist:  None  Chief Complaint / Patient Profile   78 y.o. y/o male with a h/o ARVC, atrial fibrillation/flutter, VT, ILD, sinus node dysfunction, amiodarone  therapy, sinus bradycardia who is pending lumbar epidural injection on date TBD by [facility]Robert Ochoa imaging spine and interventional radiology and presents today for telephonic preoperative cardiovascular risk assessment.  History of Present Illness    Robert Ochoa is a 78 y.o. male who presents via audio/video conferencing for a telehealth visit today.  Pt was last seen in cardiology clinic on 05/24/2023 by Dr. Rodolfo Ochoa.  At that time Robert Ochoa was doing well.  The patient is now pending procedure as outlined above. Since his last visit, he denies chest pain, shortness of breath, lower extremity edema, fatigue, palpitations, melena, hematuria, hemoptysis, diaphoresis, weakness, presyncope, syncope, orthopnea, and PND.  Today patient is doing well overall.  He is without any acute cardiovascular concerns or complaints.  He denies any chest pain or exertional angina.  He denies any tachycardia or palpitations.   Specifically no syncope.  Notes that he used to be extremely active, working out every day and even walking 4 [place] a day.  However since the beginning of the year he has had lower back pains that has really limited his activity.  However he is still able to walk, workout occasionally, and do housework without any symptoms.  Overall he is able to achieve greater than 4 METS.  Past Medical History    Past Medical History:  Diagnosis Date   Arrhythmogenic right ventricular dysplas (HCC)    Arrhythmogenic right ventricular dysplasia (HCC)    Hx of VT s/p dual-chamber St. Jude ICD. Cardiac MRI 2010 showed moderately dilated RV with hypokinesis, LV EF 47%. EF 50-55% 2010 but down to 45% 05/2011. Cath 01/2009 without significant CAD.   Atrial fibrillation or flutter    Paroxysmal. Started on Pradaxa  05/2011.   Cardiac defibrillator in place    LV dysfunction    per echo 10/2011; EF 35%  // Echo 2/18: EF 40-45, diff HK, Gr 1 DD, mild dilated aortic root, mod LAE, mod RVE, mild reduced RVSF, mod RAE   Prostate cancer (HCC)    Septic shock(785.52) 10-27-11 cause unknown   with associated respiratory/renal failure/PAF/bradycardia   Ventricular tachycardia (HCC)    ICD discharges 05/2011 for this & AF-RVR - started on amiodarone  then.   Wears glasses    Past Surgical History:  Procedure Laterality Date   APPENDECTOMY  2002   ATRIAL FIBRILLATION ABLATION N/A 02/04/2022   Procedure: ATRIAL FIBRILLATION ABLATION;  Surgeon: Robert Grange, MD;  [facility];  Service: Cardiovascular;  Laterality: N/A;   CARDIAC DEFIBRILLATOR PLACEMENT  02/19/09  St,. Jude    CARDIOVERSION N/A 05/28/2022   Procedure: CARDIOVERSION;  Surgeon: Robert Rule, MD;  Location: [facility] ENDOSCOPY;  Service: Cardiovascular;  Laterality: N/A;   ICD GENERATOR CHANGEOUT N/A 03/27/2021   Procedure: ICD GENERATOR CHANGEOUT;  Surgeon: Robert Needle, MD;  Location: [facility]VAS[facility] CV LAB;  Service: Cardiovascular;   Laterality: N/A;   IMPLANTABLE CARDIOVERTER DEFIBRILLATOR (ICD) GENERATOR CHANGE N/A 03/01/2013   Procedure: ICD GENERATOR CHANGE;  Surgeon: Robert Goodwill, MD;  Location: [facility] CATH LAB;  Service: Cardiovascular;  Laterality: N/A;   IMPLANTABLE CARDIOVERTER DEFIBRILLATOR GENERATOR CHANGE  03/01/13   for premature battery depletetion.  Now has a SJM Ellipse DR device placed by Dr Robert Ochoa   PROSTATE BIOPSY  11/2019   RADIOACTIVE SEED IMPLANT N/A 04/30/2020   Procedure: RADIOACTIVE SEED IMPLANT/BRACHYTHERAPY IMPLANT;  Surgeon: Robert Homans, MD;  Location: [facility];  Service: Urology;  Laterality: N/A;   SPACE OAR INSTILLATION N/A 04/30/2020   Procedure: SPACE OAR INSTILLATION;  Surgeon: Robert Homans, MD;  Location: [facility];  Service: Urology;  Laterality: N/A;    Allergies  No Known Allergies  Home Medications    Prior to Admission medications   Medication Sig Start Date End Date Taking? Authorizing Provider  amiodarone  (PACERONE ) 100 MG tablet TAKE 1 TABLET(100 MG) BY MOUTH DAILY 05/07/23   Mealor, Robert E, MD  Calcium-Magnesium -Zinc (CAL-MAG-ZINC PO) Take 1 tablet by mouth daily.    [provider]  Carboxymethylcellul-Glycerin (LUBRICATING EYE DROPS OP) Place 1 drop into both eyes daily as needed (dry eyes).    [provider]  ELIQUIS  5 MG TABS tablet TAKE 1 TABLET BY MOUTH TWICE DAILY 06/28/23   Robert Goodwill, MD  furosemide  (LASIX ) 20 MG tablet TAKE 1 TABLET BY MOUTH DAILY AS NEEDED FOR SWELLING 03/22/23   Mealor, Robert E, MD  lisinopril  (ZESTRIL ) 10 MG tablet TAKE 2 TABLETS BY MOUTH EVERY DAY 10/02/22   Robert Goodwill, MD  Omega-3 Fatty Acids (OMEGA 3 PO) Take 1,600 mg by mouth daily.    [provider]  sildenafil  (REVATIO ) 20 MG tablet Take 20-100 mg by mouth daily as needed (ED).    [provider]  tamsulosin (FLOMAX) 0.4 MG CAPS capsule Take 0.4 mg by mouth daily. 05/16/20   [provider]  triamcinolone cream (KENALOG) 0.1 % Apply 1 Application topically 2 (two) times daily as needed (itchy scalp). Patient not taking: Reported on 08/11/2023 04/15/22   [provider]  vitamin C (ASCORBIC ACID) 500 MG tablet Take 1,000 mg by mouth daily.    [provider]    Physical Exam    Vital Signs:  Robert Ochoa does not have vital signs available for review today.  Given telephonic nature of communication, physical exam is limited. AAOx3. NAD. Normal affect.  Speech and respirations are unlabored.  Accessory Clinical Findings    None  Assessment & Plan    1.  Preoperative Cardiovascular Risk Assessment: According to the Revised Cardiac Risk Index (RCRI), his Perioperative Risk of Major Cardiac Event is (%): 0.4. His Functional Capacity in METs is: 5.07 according to the Duke Activity Status Index (DASI). Therefore, based on ACC/AHA guidelines, patient would be at acceptable risk for the planned procedure without further cardiovascular testing.   The patient was advised that if he develops new symptoms prior to surgery to contact our office to arrange for a follow-up visit, and he verbalized understanding.  Per office protocol, patient can hold  Eliquis  for 3 days prior to procedure.    A copy of this note will be routed to requesting surgeon.  Time:   Today, I have spent 10 minutes with the patient with telehealth technology discussing medical history, symptoms, and management plan.     Ava Boatman, NP  08/17/2023, 1:54 PM

## 2023-08-19 ENCOUNTER — Encounter: Payer: Self-pay | Admitting: Family Medicine

## 2023-08-20 NOTE — Addendum Note (Signed)
 Addended by: Lott Rouleau A on: 08/20/2023 02:16 PM   Modules accepted: Orders

## 2023-08-20 NOTE — Progress Notes (Signed)
 Remote pacemaker transmission.

## 2023-08-24 NOTE — Discharge Instructions (Signed)

## 2023-08-25 ENCOUNTER — Ambulatory Visit
Admission: RE | Admit: 2023-08-25 | Discharge: 2023-08-25 | Disposition: A | Discharge status: Home / Self Care | Source: Ambulatory Visit | Attending: Family Medicine | Admitting: Family Medicine

## 2023-08-25 DIAGNOSIS — M5416 Radiculopathy, lumbar region: Secondary | ICD-10-CM

## 2023-08-25 DIAGNOSIS — M5116 Intervertebral disc disorders with radiculopathy, lumbar region: Secondary | ICD-10-CM | POA: Diagnosis not present

## 2023-08-25 MED ORDER — METHYLPREDNISOLONE ACETATE 40 MG/ML INJ SUSP (RADIOLOG
80.0000 mg | Freq: Once | INTRAMUSCULAR | Status: AC
  Administered 2023-08-25: 80 mg via EPIDURAL

## 2023-08-25 MED ORDER — IOPAMIDOL (ISOVUE-M 200) INJECTION 41%
1.0000 mL | Freq: Once | INTRAMUSCULAR | Status: AC
  Administered 2023-08-25: 1 mL via EPIDURAL

## 2023-08-27 ENCOUNTER — Other Ambulatory Visit: Payer: Self-pay

## 2023-08-27 ENCOUNTER — Telehealth: Payer: Self-pay | Admitting: Family Medicine

## 2023-08-27 DIAGNOSIS — M5416 Radiculopathy, lumbar region: Secondary | ICD-10-CM

## 2023-08-27 NOTE — Telephone Encounter (Signed)
 Per recent MyChart communications, pt would like to be referred to [facility] on [address].

## 2023-08-31 DIAGNOSIS — F4322 Adjustment disorder with anxiety: Secondary | ICD-10-CM | POA: Diagnosis not present

## 2023-09-02 ENCOUNTER — Ambulatory Visit (INDEPENDENT_AMBULATORY_CARE_PROVIDER_SITE_OTHER): Payer: Medicare Other | Admitting: Podiatry

## 2023-09-02 ENCOUNTER — Encounter: Payer: Self-pay | Admitting: Podiatry

## 2023-09-02 DIAGNOSIS — D2371 Other benign neoplasm of skin of right lower limb, including hip: Secondary | ICD-10-CM

## 2023-09-02 DIAGNOSIS — M7751 Other enthesopathy of right foot: Secondary | ICD-10-CM | POA: Diagnosis not present

## 2023-09-06 NOTE — Progress Notes (Signed)
 He presents today complaining of a painful lesion plantar aspect of his right foot.  Objective: Vital signs are stable alert oriented x 3.  Pulses are palpable.  He has a palpable bursa beneath the fifth metatarsal head of the right foot.  Porokeratotic lesion is also noted in this area.  No open lesions or wounds.  Assessment: Bursitis plantar aspect fifth metatarsal right.  Benign skin lesion plantar aspect right.  Plan: Injected just beneath benign skin lesion into the bursa today with dexamethasone  and local anesthetic a total of 2 mg was injected.  Debrided the benign skin lesion.

## 2023-09-14 DIAGNOSIS — F4322 Adjustment disorder with anxiety: Secondary | ICD-10-CM | POA: Diagnosis not present

## 2023-09-23 ENCOUNTER — Other Ambulatory Visit: Payer: Self-pay | Admitting: Internal Medicine

## 2023-09-24 DIAGNOSIS — M2560 Stiffness of unspecified joint, not elsewhere classified: Secondary | ICD-10-CM | POA: Diagnosis not present

## 2023-09-24 DIAGNOSIS — M6281 Muscle weakness (generalized): Secondary | ICD-10-CM | POA: Diagnosis not present

## 2023-09-24 DIAGNOSIS — M545 Low back pain, unspecified: Secondary | ICD-10-CM | POA: Diagnosis not present

## 2023-09-24 DIAGNOSIS — R2681 Unsteadiness on feet: Secondary | ICD-10-CM | POA: Diagnosis not present

## 2023-09-29 DIAGNOSIS — M2560 Stiffness of unspecified joint, not elsewhere classified: Secondary | ICD-10-CM | POA: Diagnosis not present

## 2023-09-29 DIAGNOSIS — R2681 Unsteadiness on feet: Secondary | ICD-10-CM | POA: Diagnosis not present

## 2023-09-29 DIAGNOSIS — M6281 Muscle weakness (generalized): Secondary | ICD-10-CM | POA: Diagnosis not present

## 2023-09-29 DIAGNOSIS — M545 Low back pain, unspecified: Secondary | ICD-10-CM | POA: Diagnosis not present

## 2023-10-01 ENCOUNTER — Other Ambulatory Visit: Payer: Self-pay | Admitting: Cardiovascular Disease

## 2023-10-06 ENCOUNTER — Ambulatory Visit: Payer: Medicare Other

## 2023-10-06 DIAGNOSIS — I428 Other cardiomyopathies: Secondary | ICD-10-CM

## 2023-10-06 DIAGNOSIS — M6281 Muscle weakness (generalized): Secondary | ICD-10-CM | POA: Diagnosis not present

## 2023-10-06 DIAGNOSIS — M545 Low back pain, unspecified: Secondary | ICD-10-CM | POA: Diagnosis not present

## 2023-10-06 DIAGNOSIS — M2560 Stiffness of unspecified joint, not elsewhere classified: Secondary | ICD-10-CM | POA: Diagnosis not present

## 2023-10-06 DIAGNOSIS — R2681 Unsteadiness on feet: Secondary | ICD-10-CM | POA: Diagnosis not present

## 2023-10-06 NOTE — Progress Notes (Signed)
 [name] [address] [place] [address] Phone: [phone] Subjective:   Robert Ochoa, am serving as a scribe for Dr. Arthea Claudene.  I'm seeing this patient by the request  of:  Robert Righter, MD  CC: Low back pain follow-up  YEP:Dlagzrupcz  07/19/2023 Patient is having signs and symptoms consistent with some spinal stenosis and is having weakness in the leg where patient even fell.  Do feel at this point with it affecting daily activities and not allowing him to be active secondary to the discomfort and pain I do want to consider getting a MRI of the lumbar spine.  Unfortunately patient does have a pacemaker so we will do a CT myelogram.  Patient is on Eliquis  but should do relatively well with myelogram I would anticipate.  Depending on findings we will discuss if patient is a candidate for potential epidurals.  Discussed icing regimen and home exercises.  Follow-up again in 6 to 8 weeks otherwise.     Update 10/13/2023 Robert Ochoa is a 78 y.o. male coming in with complaint of lumbar spine pain. Epidural 08/25/2023. Patient states that he was pain-free for 2 weeks. Pain returning but not as bad as it was before the epidural. Doing PT.    CT scan of the back showed that there was normal rocking motion with flexion extension of the left sided extradural defect distant with posterior lateral disc herniation and facet arthropathy with synovial cyst on the left side causing nerve impingement.  Had an epidural in the L3-L4 area May 28.    Past Medical History:  Diagnosis Date   Arrhythmogenic right ventricular dysplas [facility])    Arrhythmogenic right ventricular dysplasia (HCC)    Hx of VT s/p dual-chamber St. Jude ICD. Cardiac MRI 2010 showed moderately dilated RV with hypokinesis, LV EF 47%. EF 50-55% 2010 but down to 45% 05/2011. Cath 01/2009 without significant CAD.   Atrial fibrillation or flutter    Paroxysmal. Started on Pradaxa  05/2011.    Cardiac defibrillator in place    LV dysfunction    per echo 10/2011; EF 35%  // Echo 2/18: EF 40-45, diff HK, Gr 1 DD, mild dilated aortic root, mod LAE, mod RVE, mild reduced RVSF, mod RAE   Prostate cancer (HCC)    Septic shock(785.52) 10-27-11 cause unknown   with associated respiratory/renal failure/PAF/bradycardia   Ventricular tachycardia (HCC)    ICD discharges 05/2011 for this & AF-RVR - started on amiodarone  then.   Wears glasses    Past Surgical History:  Procedure Laterality Date   APPENDECTOMY  2002   ATRIAL FIBRILLATION ABLATION N/A 02/04/2022   Procedure: ATRIAL FIBRILLATION ABLATION;  Surgeon: [name], MD;  Location: [facility] INVASIVE CV LAB;  Service: Cardiovascular;  Laterality: N/A;   CARDIAC DEFIBRILLATOR PLACEMENT  02/19/09    St,. Jude    CARDIOVERSION N/A 05/28/2022   Procedure: CARDIOVERSION;  Surgeon: [name], MD;  Location: [facility] ENDOSCOPY;  Service: Cardiovascular;  Laterality: N/A;   ICD GENERATOR CHANGEOUT N/A 03/27/2021   Procedure: ICD GENERATOR CHANGEOUT;  Surgeon: [name], MD;  Location: [facility] INVASIVE CV LAB;  Service: Cardiovascular;  Laterality: N/A;   IMPLANTABLE CARDIOVERTER DEFIBRILLATOR (ICD) GENERATOR CHANGE N/A 03/01/2013   Procedure: ICD GENERATOR CHANGE;  Surgeon: Elspeth BROCKS Sage, MD;  Location: [facility] CATH LAB;  Service: Cardiovascular;  Laterality: N/A;   IMPLANTABLE CARDIOVERTER DEFIBRILLATOR GENERATOR CHANGE  03/01/13   for premature battery depletetion.  Now has a SJM Ellipse  DR device placed by Dr Fernande   PROSTATE BIOPSY  11/2019   RADIOACTIVE SEED IMPLANT N/A 04/30/2020   Procedure: RADIOACTIVE SEED IMPLANT/BRACHYTHERAPY IMPLANT;  Surgeon: [name], MD;  Location: [facility];  Service: Urology;  Laterality: N/A;   SPACE OAR INSTILLATION N/A 04/30/2020   Procedure: SPACE OAR INSTILLATION;  Surgeon: [name], MD;  Location: [facility];  Service: Urology;  Laterality: N/A;    Social History   Socioeconomic History   Marital status: Single    Spouse name: Not on file   Number of children: 1   Years of education: Not on file   Highest education level: Not on file  Occupational History    Employer: [employer]    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 Self employed.    Social Drivers of Corporate investment banker Strain: Not on file  Food Insecurity: Not on file  Transportation Needs: Not on file  Physical Activity: Not on file  Stress: Not on file  Social Connections: Not on file   No Known Allergies Family History  Problem Relation Age of Onset   Heart attack Father    Allergies Mother    Cancer Paternal Aunt        type unknown   Prostate cancer Cousin    Colon cancer Neg Hx    Pancreatic cancer Neg Hx    Breast cancer Neg Hx      Current Outpatient Medications (Cardiovascular):    amiodarone  (PACERONE ) 100 MG tablet, TAKE 1 TABLET(100 MG) BY MOUTH DAILY   furosemide  (LASIX ) 20 MG tablet, TAKE 1 TABLET BY MOUTH DAILY AS NEEDED FOR SWELLING   lisinopril  (ZESTRIL ) 10 MG tablet, TAKE 2 TABLETS BY MOUTH EVERY DAY   sildenafil  (REVATIO ) 20 MG tablet, Take 20-100 mg by mouth daily as needed (ED).    Current Outpatient Medications (Hematological):    ELIQUIS  5 MG TABS tablet, TAKE 1 TABLET BY MOUTH TWICE DAILY  Current Outpatient Medications (Other):    Calcium-Magnesium -Zinc (CAL-MAG-ZINC PO), Take 1 tablet by mouth daily.   Carboxymethylcellul-Glycerin (LUBRICATING  EYE DROPS OP), Place 1 drop into both eyes daily as needed (dry eyes).   Omega-3 Fatty Acids (OMEGA 3 PO), Take 1,600 mg by mouth daily.   tamsulosin (FLOMAX) 0.4 MG CAPS capsule, Take 0.4 mg by mouth daily.   triamcinolone cream (KENALOG) 0.1 %, Apply 1 Application topically 2 (two) times daily as needed (itchy scalp).   vitamin C (ASCORBIC ACID) 500 MG tablet, Take 1,000 mg by mouth daily.   Reviewed prior external information including notes and imaging from  primary care provider As well as notes that were available from care everywhere and other healthcare systems.  Past medical history, social, surgical and family history all reviewed in electronic medical record.  No pertanent information unless stated regarding to the chief complaint.   Review of Systems:  No  headache, visual changes, nausea, vomiting, diarrhea, constipation, dizziness, abdominal pain, skin rash, fevers, chills, night sweats, weight loss, swollen lymph nodes, body aches, joint swelling, chest pain, shortness of breath, mood changes. POSITIVE muscle aches  Objective  Blood pressure 118/82, pulse 90, height 6' 2 (1.88 m), weight 218 lb (98.9 kg), SpO2 96%.   General: No apparent distress alert and oriented x3 mood and affect normal, dressed appropriately.  HEENT: Pupils equal, extraocular movements intact  Respiratory: Patient's speak in full sentences and does not appear short of breath  Cardiovascular: No lower extremity edema, non tender, no erythema  , Back exam shows mild loss lordosis.  Patient does have a widened based gait initially with walking and then seems to improve. Neurovascular intact distally.  More discomfort with extension greater than 5 degrees.    Impression and Recommendations:     The above documentation has been reviewed and is accurate and complete Robert Ochoa M Tymere Depuy, DO

## 2023-10-07 LAB — CUP PACEART REMOTE DEVICE CHECK
Battery Remaining Longevity: 59 mo
Battery Remaining Percentage: 67 %
Battery Voltage: 2.96 V
Brady Statistic RV Percent Paced: 67 %
Date Time Interrogation Session: 20250709020022
HighPow Impedance: 55 Ohm
HighPow Impedance: 55 Ohm
Implantable Lead Connection Status: 753985
Implantable Lead Connection Status: 753985
Implantable Lead Implant Date: 20101123
Implantable Lead Implant Date: 20101123
Implantable Lead Location: 753859
Implantable Lead Location: 753860
Implantable Lead Model: 7121
Implantable Pulse Generator Implant Date: 20201229
Lead Channel Impedance Value: 350 Ohm
Lead Channel Impedance Value: 440 Ohm
Lead Channel Pacing Threshold Amplitude: 1.25 V
Lead Channel Pacing Threshold Pulse Width: 0.6 ms
Lead Channel Sensing Intrinsic Amplitude: 3.6 mV
Lead Channel Sensing Intrinsic Amplitude: 7.8 mV
Lead Channel Setting Pacing Amplitude: 2.5 V
Lead Channel Setting Pacing Pulse Width: 0.6 ms
Lead Channel Setting Sensing Sensitivity: 0.5 mV
Pulse Gen Serial Number: 8907499

## 2023-10-11 DIAGNOSIS — M545 Low back pain, unspecified: Secondary | ICD-10-CM | POA: Diagnosis not present

## 2023-10-11 DIAGNOSIS — R2681 Unsteadiness on feet: Secondary | ICD-10-CM | POA: Diagnosis not present

## 2023-10-11 DIAGNOSIS — M2560 Stiffness of unspecified joint, not elsewhere classified: Secondary | ICD-10-CM | POA: Diagnosis not present

## 2023-10-11 DIAGNOSIS — M6281 Muscle weakness (generalized): Secondary | ICD-10-CM | POA: Diagnosis not present

## 2023-10-12 DIAGNOSIS — F4322 Adjustment disorder with anxiety: Secondary | ICD-10-CM | POA: Diagnosis not present

## 2023-10-12 DIAGNOSIS — L821 Other seborrheic keratosis: Secondary | ICD-10-CM | POA: Diagnosis not present

## 2023-10-12 DIAGNOSIS — L812 Freckles: Secondary | ICD-10-CM | POA: Diagnosis not present

## 2023-10-12 DIAGNOSIS — L570 Actinic keratosis: Secondary | ICD-10-CM | POA: Diagnosis not present

## 2023-10-12 DIAGNOSIS — Z85828 Personal history of other malignant neoplasm of skin: Secondary | ICD-10-CM | POA: Diagnosis not present

## 2023-10-13 ENCOUNTER — Ambulatory Visit (INDEPENDENT_AMBULATORY_CARE_PROVIDER_SITE_OTHER): Admitting: Family Medicine

## 2023-10-13 ENCOUNTER — Ambulatory Visit: Payer: Self-pay | Admitting: Cardiology

## 2023-10-13 VITALS — BP 118/82 | HR 90 | Ht 74.0 in | Wt 218.0 lb

## 2023-10-13 DIAGNOSIS — M4317 Spondylolisthesis, lumbosacral region: Secondary | ICD-10-CM

## 2023-10-13 DIAGNOSIS — M545 Low back pain, unspecified: Secondary | ICD-10-CM | POA: Diagnosis not present

## 2023-10-13 DIAGNOSIS — M2560 Stiffness of unspecified joint, not elsewhere classified: Secondary | ICD-10-CM | POA: Diagnosis not present

## 2023-10-13 DIAGNOSIS — R2681 Unsteadiness on feet: Secondary | ICD-10-CM | POA: Diagnosis not present

## 2023-10-13 DIAGNOSIS — M6281 Muscle weakness (generalized): Secondary | ICD-10-CM | POA: Diagnosis not present

## 2023-10-13 NOTE — Assessment & Plan Note (Signed)
 Discussed with patient at great length.  Patient has made significant strides in improvement with the last epidural and I do think that another 1 could be beneficial if needed.  Patient would like to continue with the formal physical therapy where patient feels like he is gaining strength and stability.  Continue to work on core strength.  No change in medication management today.  Follow-up again in 3 months

## 2023-10-13 NOTE — Patient Instructions (Signed)
 Good to see you Stay active [facility] Avoid repetitive extension See me in 3 months

## 2023-10-18 ENCOUNTER — Encounter: Payer: Self-pay | Admitting: Family Medicine

## 2023-10-18 DIAGNOSIS — M545 Low back pain, unspecified: Secondary | ICD-10-CM | POA: Diagnosis not present

## 2023-10-18 DIAGNOSIS — R2681 Unsteadiness on feet: Secondary | ICD-10-CM | POA: Diagnosis not present

## 2023-10-18 DIAGNOSIS — M2560 Stiffness of unspecified joint, not elsewhere classified: Secondary | ICD-10-CM | POA: Diagnosis not present

## 2023-10-18 DIAGNOSIS — M6281 Muscle weakness (generalized): Secondary | ICD-10-CM | POA: Diagnosis not present

## 2023-10-20 DIAGNOSIS — M6281 Muscle weakness (generalized): Secondary | ICD-10-CM | POA: Diagnosis not present

## 2023-10-20 DIAGNOSIS — R2681 Unsteadiness on feet: Secondary | ICD-10-CM | POA: Diagnosis not present

## 2023-10-20 DIAGNOSIS — M2560 Stiffness of unspecified joint, not elsewhere classified: Secondary | ICD-10-CM | POA: Diagnosis not present

## 2023-10-20 DIAGNOSIS — M545 Low back pain, unspecified: Secondary | ICD-10-CM | POA: Diagnosis not present

## 2023-10-25 NOTE — Telephone Encounter (Signed)
 Patient called to follow up on the message. He said that the pain seems to be getting worse and he is having trouble getting around his house at this point. Looking for advice.

## 2023-10-26 ENCOUNTER — Other Ambulatory Visit: Payer: Self-pay

## 2023-10-26 ENCOUNTER — Telehealth: Payer: Self-pay

## 2023-10-26 DIAGNOSIS — F4322 Adjustment disorder with anxiety: Secondary | ICD-10-CM | POA: Diagnosis not present

## 2023-10-26 DIAGNOSIS — M5416 Radiculopathy, lumbar region: Secondary | ICD-10-CM

## 2023-10-26 NOTE — Telephone Encounter (Signed)
   Pre-operative Risk Assessment    Patient Name: Robert Ochoa  DOB: Aug 07, 1945 MRN: [id number]   Date of last office visit: 05/24/23 STEVEN KLEIN, MD Date of next office visit: NONE   Request for Surgical Clearance    Procedure:  LUMBAR EPIDURAL  Date of Surgery:  Clearance TBD                                Surgeon:  NOT INDICATED Surgeon's Group or Practice Name:  [facility] Phone number:  [phone] Fax number:  [phone]   Type of Clearance Requested:   - Medical  - Pharmacy:  Hold Apixaban  (Eliquis )     Type of Anesthesia:  Not Indicated   Additional requests/questions:    SignedLucie DELENA Ku   10/26/2023, 5:00 PM

## 2023-10-27 NOTE — Telephone Encounter (Signed)
 Pharmacy please advise on holding Eliquis  prior to lumbar epideral scheduled for TBD. Last labs 05/24/23 and 08/12/2023 respectively. Thank you.

## 2023-10-29 NOTE — Telephone Encounter (Signed)
 Patient called to follow-up on the status of his clearance and when he can hold his Eliquis .

## 2023-10-29 NOTE — Telephone Encounter (Signed)
 Will route to Preop pool for the Preop APP to review.

## 2023-11-01 DIAGNOSIS — H35362 Drusen (degenerative) of macula, left eye: Secondary | ICD-10-CM | POA: Diagnosis not present

## 2023-11-02 ENCOUNTER — Ambulatory Visit: Attending: General Practice

## 2023-11-02 ENCOUNTER — Telehealth: Payer: Self-pay

## 2023-11-02 DIAGNOSIS — Z01810 Encounter for preprocedural cardiovascular examination: Secondary | ICD-10-CM

## 2023-11-02 NOTE — Telephone Encounter (Signed)
 S/W pt and scheduled TELE Preop appt 11/02/23. Med rec and consent done.

## 2023-11-02 NOTE — Progress Notes (Signed)
 Virtual Visit via Telephone Note   Because of Robert Ochoa co-morbid illnesses, he is at least at moderate risk for complications without adequate follow up.  This format is felt to be most appropriate for this patient at this time.  Due to technical limitations with video connection (technology), today's appointment will be conducted as an audio only telehealth visit, and Robert Ochoa verbally agreed to proceed in this manner.   All issues noted in this document were discussed and addressed.  No physical exam could be performed with this format.  Evaluation Performed:  Preoperative cardiovascular risk assessment _____________   Date:  11/02/2023   Patient ID:  Robert Ochoa, DOB April 02, 1945, MRN [id number] Patient Location:  Home Provider location:   Office  Primary Care Provider:  [name], MD Primary Cardiologist:  None  Chief Complaint / Patient Profile   78 y.o. y/o male with a h/o VTE, permanent atrial fibrillation, hypothyroidism who is pending lumbar epidural and presents today for telephonic preoperative cardiovascular risk assessment.  History of Present Illness    Robert Ochoa is a 78 y.o. male who presents via audio/video conferencing for a telehealth visit today.  Pt was last seen in cardiology clinic on 05/24/2023 by Dr. Fernande.  At that time Robert Ochoa was doing well .  The patient is now pending procedure as outlined above. Since his last visit, he remains stable from a cardiac standpoint.  Today he denies chest pain, shortness of breath, lower extremity edema, fatigue, palpitations, melena, hematuria, hemoptysis, diaphoresis, weakness, presyncope, syncope, orthopnea, and PND.   Past Medical History    Past Medical History:  Diagnosis Date   Arrhythmogenic right ventricular dysplas (HCC)    Arrhythmogenic right ventricular dysplasia (HCC)    Hx of VT s/p dual-chamber St. Jude ICD. Cardiac MRI 2010 showed moderately dilated RV with hypokinesis, LV EF  47%. EF 50-55% 2010 but down to 45% 05/2011. Cath 01/2009 without significant CAD.   Atrial fibrillation or flutter    Paroxysmal. Started on Pradaxa  05/2011.   Cardiac defibrillator in place    LV dysfunction    per echo 10/2011; EF 35%  // Echo 2/18: EF 40-45, diff HK, Gr 1 DD, mild dilated aortic root, mod LAE, mod RVE, mild reduced RVSF, mod RAE   Prostate cancer (HCC)    Septic shock(785.52) 10-27-11 cause unknown   with associated respiratory/renal failure/PAF/bradycardia   Ventricular tachycardia (HCC)    ICD discharges 05/2011 for this & AF-RVR - started on amiodarone  then.   Wears glasses    Past Surgical History:  Procedure Laterality Date   APPENDECTOMY  2002   ATRIAL FIBRILLATION ABLATION N/A 02/04/2022   Procedure: ATRIAL FIBRILLATION ABLATION;  Surgeon: Robert Eulas BRAVO, MD;  Location: [facility]VASIVE [facility];  Service: Cardiovascular;  Laterality: N/A;   CARDIAC DEFIBRILLATOR PLACEMENT  02/19/09    St,. Jude    CARDIOVERSION N/A 05/28/2022   Procedure: CARDIOVERSION;  Surgeon: Robert Maude BROCKS, MD;  Location: [facility] ENDOSCOPY;  Service: Cardiovascular;  Laterality: N/A;   ICD GENERATOR CHANGEOUT N/A 03/27/2021   Procedure: ICD GENERATOR CHANGEOUT;  Surgeon: Robert Agent, MD;  Location: [facility] INVASIVE CV LAB;  Service: Cardiovascular;  Laterality: N/A;   IMPLANTABLE CARDIOVERTER DEFIBRILLATOR (ICD) GENERATOR CHANGE N/A 03/01/2013   Procedure: ICD GENERATOR CHANGE;  Surgeon: Robert Ochoa Fernande, MD;  Location: [facility] CATH LAB;  Service: Cardiovascular;  Laterality: N/A;   IMPLANTABLE CARDIOVERTER DEFIBRILLATOR GENERATOR CHANGE  03/01/13   for premature battery depletetion.  Now has a SJM  Ellipse DR device placed by Dr Robert Ochoa   PROSTATE BIOPSY  11/2019   RADIOACTIVE SEED IMPLANT N/A 04/30/2020   Procedure: RADIOACTIVE SEED IMPLANT/BRACHYTHERAPY IMPLANT;  Surgeon: Robert Lonni Righter, MD;  Location: [facility];  Service: Urology;  Laterality: N/A;   SPACE OAR INSTILLATION N/A  04/30/2020   Procedure: SPACE OAR INSTILLATION;  Surgeon: Robert Lonni Righter, MD;  Location: [facility];  Service: Urology;  Laterality: N/A;    Allergies  No Known Allergies  Home Medications    Prior to Admission medications   Medication Sig Start Date End Date Taking? Authorizing Provider  amiodarone  (PACERONE ) 100 MG tablet TAKE 1 TABLET(100 MG) BY MOUTH DAILY 10/04/23   Robert Ochoa Robert BROCKS, MD  Calcium-Magnesium -Zinc (CAL-MAG-ZINC PO) Take 1 tablet by mouth daily.    [provider]  Carboxymethylcellul-Glycerin (LUBRICATING EYE DROPS OP) Place 1 drop into both eyes daily as needed (dry eyes).    [provider]  ELIQUIS  5 MG TABS tablet TAKE 1 TABLET BY MOUTH TWICE DAILY 06/28/23   Robert Ochoa Robert BROCKS, MD  furosemide  (LASIX ) 20 MG tablet TAKE 1 TABLET BY MOUTH DAILY AS NEEDED FOR SWELLING 03/22/23   Ochoa, Robert E, MD  lisinopril  (ZESTRIL ) 10 MG tablet TAKE 2 TABLETS BY MOUTH EVERY DAY 09/24/23   Robert Ochoa Robert BROCKS, MD  Omega-3 Fatty Acids (OMEGA 3 PO) Take 1,600 mg by mouth daily.    [provider]  sildenafil  (REVATIO ) 20 MG tablet Take 20-100 mg by mouth daily as needed (ED).    [provider]  tamsulosin (FLOMAX) 0.4 MG CAPS capsule Take 0.4 mg by mouth daily. 05/16/20   [provider]  triamcinolone cream (KENALOG) 0.1 % Apply 1 Application topically 2 (two) times daily as needed (itchy scalp). 04/15/22   [provider]  vitamin C (ASCORBIC ACID) 500 MG tablet Take 1,000 mg by mouth daily.    [provider]    Physical Exam    Vital Signs:  Robert Ochoa does not have vital signs available for review today.  Given telephonic nature of communication, physical exam is limited. AAOx3. NAD. Normal affect.  Speech and respirations are unlabored.  Accessory Clinical Findings    None  Assessment & Plan    1.  Preoperative Cardiovascular Risk Assessment: LUMBAR EPIDURAL   Date of Surgery:   Clearance TBD                                  Surgeon:  NOT INDICATED Surgeon's Group or Practice Name:  [facility] Phone number:  [phone] Fax number:  [phone]      Primary Cardiologist: None  Chart reviewed as part of pre-operative protocol coverage. Given past medical history and time since last visit, based on ACC/AHA guidelines, Salvatore Shear would be at acceptable risk for the planned procedure without further cardiovascular testing.   Patient was advised that if he develops new symptoms prior to surgery to contact our office to arrange a follow-up appointment.  He verbalized understanding.  Per office protocol, patient can hold Eliquis  for 3 days prior to procedure.   Patient will not need bridging with Lovenox (enoxaparin) around procedure.  I will route this recommendation to the requesting party via Epic fax function and remove from pre-op pool.      Time:   Today, I have spent 5 minutes with the patient with telehealth technology discussing medical history, symptoms,  and management plan.  I spent 10 minutes reviewing patient's past cardiac history and cardiac medications.    Josefa CHRISTELLA Beauvais, NP  11/02/2023, 11:47 AM

## 2023-11-02 NOTE — Telephone Encounter (Signed)
  Patient Consent for Virtual Visit        Robert Ochoa has provided verbal consent on 11/02/2023 for a virtual visit (video or telephone).   CONSENT FOR VIRTUAL VISIT FOR:  Robert Ochoa  By participating in this virtual visit I agree to the following:  I hereby voluntarily request, consent and authorize [facility] and its employed or contracted physicians, physician assistants, nurse practitioners or other licensed health care professionals (the Practitioner), to provide me with telemedicine health care services (the "Services) as deemed necessary by the treating Practitioner. I acknowledge and consent to receive the Services by the Practitioner via telemedicine. I understand that the telemedicine visit will involve communicating with the Practitioner through live audiovisual communication technology and the disclosure of certain medical information by electronic transmission. I acknowledge that I have been given the opportunity to request an in-person assessment or other available alternative prior to the telemedicine visit and am voluntarily participating in the telemedicine visit.  I understand that I have the right to withhold or withdraw my consent to the use of telemedicine in the course of my care at any time, without affecting my right to future care or treatment, and that the Practitioner or I may terminate the telemedicine visit at any time. I understand that I have the right to inspect all information obtained and/or recorded in the course of the telemedicine visit and may receive copies of available information for a reasonable fee.  I understand that some of the potential risks of receiving the Services via telemedicine include:  Delay or interruption in medical evaluation due to technological equipment failure or disruption; Information transmitted may not be sufficient (e.g. poor resolution of images) to allow for appropriate medical decision making by the Practitioner;  and/or  In rare instances, security protocols could fail, causing a breach of personal health information.  Furthermore, I acknowledge that it is my responsibility to provide information about my medical history, conditions and care that is complete and accurate to the best of my ability. I acknowledge that Practitioner's advice, recommendations, and/or decision may be based on factors not within their control, such as incomplete or inaccurate data provided by me or distortions of diagnostic images or specimens that may result from electronic transmissions. I understand that the practice of medicine is not an exact science and that Practitioner makes no warranties or guarantees regarding treatment outcomes. I acknowledge that a copy of this consent can be made available to me via my patient portal [facility] MyChart), or I can request a printed copy by calling the office of [facility].    I understand that my insurance will be billed for this visit.   I have read or had this consent read to me. I understand the contents of this consent, which adequately explains the benefits and risks of the Services being provided via telemedicine.  I have been provided ample opportunity to ask questions regarding this consent and the Services and have had my questions answered to my satisfaction. I give my informed consent for the services to be provided through the use of telemedicine in my medical care

## 2023-11-02 NOTE — Telephone Encounter (Signed)
 Spoke to patient to get him scheduled, but realized he was already contacted. I apologized to the patient.

## 2023-11-02 NOTE — Telephone Encounter (Signed)
 Patient with diagnosis of atrial fibrillation on Eliquis  for anticoagulation.    Procedure:  LUMBAR EPIDURAL   Date of Surgery:  Clearance TBD   CHA2DS2-VASc Score = 4   This indicates a 4.8% annual risk of stroke. The patient's score is based upon: CHF History: 1 HTN History: 1 Diabetes History: 0 Stroke History: 0 Vascular Disease History: 0 Age Score: 2 Gender Score: 0   CrCl 73 Platelet count 194  Patient has not had an Afib/aflutter ablation within the last 3 months or DCCV within the last 30 days  Per office protocol, patient can hold Eliquis  for 3 days prior to procedure.   Patient will not need bridging with Lovenox (enoxaparin) around procedure.  **This guidance is not considered finalized until pre-operative APP has relayed final recommendations.**

## 2023-11-02 NOTE — Telephone Encounter (Signed)
   Name: Robert Ochoa  DOB: 02-24-1946  MRN: [id number]  Primary Cardiologist: None   Preoperative team, please contact this patient and set up a phone call appointment for further preoperative risk assessment. Please obtain consent and complete medication review. Thank you for your help.  I confirm that guidance regarding antiplatelet and oral anticoagulation therapy has been completed and, if necessary, noted below.  Patient has not had an Afib/aflutter ablation within the last 3 months or DCCV within the last 30 days   Per office protocol, patient can hold Eliquis  for 3 days prior to procedure.   Patient will not need bridging with Lovenox (enoxaparin) around procedure.  I also confirmed the patient resides in the state of [place] . As per [facility] Medical Board telemedicine laws, the patient must reside in the state in which the provider is licensed.   Josefa CHRISTELLA Beauvais, NP 11/02/2023, 11:03 AM [facility]

## 2023-11-04 ENCOUNTER — Telehealth

## 2023-11-05 NOTE — Discharge Instructions (Signed)

## 2023-11-08 ENCOUNTER — Ambulatory Visit
Admission: RE | Admit: 2023-11-08 | Discharge: 2023-11-08 | Disposition: A | Discharge status: Home / Self Care | Source: Ambulatory Visit | Attending: Family Medicine | Admitting: Family Medicine

## 2023-11-08 DIAGNOSIS — M5416 Radiculopathy, lumbar region: Secondary | ICD-10-CM

## 2023-11-08 DIAGNOSIS — M47817 Spondylosis without myelopathy or radiculopathy, lumbosacral region: Secondary | ICD-10-CM | POA: Diagnosis not present

## 2023-11-08 MED ORDER — METHYLPREDNISOLONE ACETATE 40 MG/ML INJ SUSP (RADIOLOG
80.0000 mg | Freq: Once | INTRAMUSCULAR | Status: AC
  Administered 2023-11-08 (×2): 80 mg via EPIDURAL

## 2023-11-08 MED ORDER — IOPAMIDOL (ISOVUE-M 200) INJECTION 41%
1.0000 mL | Freq: Once | INTRAMUSCULAR | Status: AC
  Administered 2023-11-08 (×2): 1 mL via EPIDURAL

## 2023-11-09 DIAGNOSIS — F4322 Adjustment disorder with anxiety: Secondary | ICD-10-CM | POA: Diagnosis not present

## 2023-11-18 ENCOUNTER — Other Ambulatory Visit: Payer: Self-pay | Admitting: Family Medicine

## 2023-11-18 DIAGNOSIS — M5416 Radiculopathy, lumbar region: Secondary | ICD-10-CM

## 2023-11-18 NOTE — Telephone Encounter (Signed)
Referral to neurosurgery sent

## 2023-12-07 DIAGNOSIS — F4322 Adjustment disorder with anxiety: Secondary | ICD-10-CM | POA: Diagnosis not present

## 2023-12-10 NOTE — Progress Notes (Signed)
 Referring Physician:  Claudene Arthea HERO, DO [address] [place],  [place] [address]  Primary Physician:  Robert Righter, MD  History of Present Illness: 12/13/2023 [name] has a history of afib, LV dysfunction, nonischemic congestive cardiomyopathy, pulmonary HTN, systolic heart failure, hearing loss, prostate CA, skin CA, hyperlipidemia.   He has a cardiac defibrillator.   He has intermittent left sided LBP with left buttock/hip pain and left groin pain. No leg pain. No numbness, tingling, or weakness. No right sided pain. He feels like he can't walk as far as he could last year. Pain is worse after prolonged sitting but improves as he walks.   History of balance issues due to his feet- this is stable. History of weakness in right foot due to skiing accident at 78 years old.   He did PT for his back at [facility] and it made him worse.  He's had 2 lumbar injections. Had improvement with first one, but not much improvement with second one.   He is on ELIQUIS .   Tobacco use: Does not smoke.   Bowel/Bladder Dysfunction: urinary urgency since prostate CA. No bowel issues.   Conservative measures:  Physical therapy:  has participated with [facility]-stopped  around late July 2025-felt worse with his back Multimodal medical therapy including regular antiinflammatories: Tylenol  Injections:  11/08/2023 ESI left at L4-5 with no relief 08/25/2023 ESI  left L3-4 with 2 weeks of relief  Past Surgery: no spine surgery  Robert Ochoa has no symptoms of cervical myelopathy.  The symptoms are causing a significant impact on the patient's life.   Review of Systems:  A 10 point review of systems is negative, except for the pertinent positives and negatives detailed in the HPI.  Past Medical History: Past Medical History:  Diagnosis Date   Arrhythmogenic right ventricular dysplas (HCC)    Arrhythmogenic right ventricular dysplasia (HCC)    Hx of VT s/p dual-chamber [employer] Jude  ICD. Cardiac MRI 2010 showed moderately dilated RV with hypokinesis, LV EF 47%. EF 50-55% 2010 but down to 45% 05/2011. Cath 01/2009 without significant CAD.   Atrial fibrillation or flutter    Paroxysmal. Started on Pradaxa  05/2011.   Cardiac defibrillator in place    LV dysfunction    per echo 10/2011; EF 35%  // Echo 2/18: EF 40-45, diff HK, Gr 1 DD, mild dilated aortic root, mod LAE, mod RVE, mild reduced RVSF, mod RAE   Prostate cancer (HCC)    Septic shock(785.52) 10-27-11 cause unknown   with associated respiratory/renal failure/PAF/bradycardia   Ventricular tachycardia (HCC)    ICD discharges 05/2011 for this & AF-RVR - started on amiodarone  then.   Wears glasses     Past Surgical History: Past Surgical History:  Procedure Laterality Date   APPENDECTOMY  2002   ATRIAL FIBRILLATION ABLATION N/A 02/04/2022   Procedure: ATRIAL FIBRILLATION ABLATION;  Surgeon: [name], MD;  Location: [facility];  Service: Cardiovascular;  Laterality: N/A;   CARDIAC DEFIBRILLATOR PLACEMENT  02/19/09    St,. Jude    CARDIOVERSION N/A 05/28/2022   Procedure: CARDIOVERSION;  Surgeon: [name], MD;  Location: [facility] ENDOSCOPY;  Service: Cardiovascular;  Laterality: N/A;   ICD GENERATOR CHANGEOUT N/A 03/27/2021   Procedure: ICD GENERATOR CHANGEOUT;  Surgeon: [name], MD;  Location: [facility]AS[facility] CV [facility];  Service: Cardiovascular;  Laterality: N/A;   IMPLANTABLE CARDIOVERTER DEFIBRILLATOR (ICD) GENERATOR CHANGE N/A 03/01/2013   Procedure: ICD GENERATOR CHANGE;  Surgeon: Elspeth BROCKS Sage,  MD;  Location: [facility] CATH LAB;  Service: Cardiovascular;  Laterality: N/A;   IMPLANTABLE CARDIOVERTER DEFIBRILLATOR GENERATOR CHANGE  03/01/13   for premature battery depletetion.  Now has a SJM Ellipse DR device placed by Dr Fernande   PROSTATE BIOPSY  11/2019   RADIOACTIVE SEED IMPLANT N/A 04/30/2020   Procedure: RADIOACTIVE SEED IMPLANT/BRACHYTHERAPY IMPLANT;  Surgeon: [name], MD;  Location:  [facility];  Service: Urology;  Laterality: N/A;   SPACE OAR INSTILLATION N/A 04/30/2020   Procedure: SPACE OAR INSTILLATION;  Surgeon: [name], MD;  Location: [facility];  Service: Urology;  Laterality: N/A;    Allergies: Allergies as of 12/13/2023   (No Known Allergies)    Medications: Outpatient Encounter Medications as of 12/13/2023  Medication Sig   amiodarone  (PACERONE ) 100 MG tablet TAKE 1 TABLET(100 MG) BY MOUTH DAILY   Calcium-Magnesium -Zinc (CAL-MAG-ZINC PO) Take 1 tablet by mouth daily.   Carboxymethylcellul-Glycerin (LUBRICATING EYE DROPS OP) Place 1 drop into both eyes daily as needed (dry eyes).   ELIQUIS  5 MG TABS tablet TAKE 1 TABLET BY MOUTH TWICE DAILY   furosemide  (LASIX ) 20 MG tablet TAKE 1 TABLET BY MOUTH DAILY AS NEEDED FOR SWELLING   lisinopril  (ZESTRIL ) 10 MG tablet TAKE 2 TABLETS BY MOUTH EVERY DAY   Omega-3 Fatty Acids (OMEGA 3 PO) Take 1,600 mg by mouth daily.   sildenafil  (REVATIO ) 20 MG tablet Take 20-100 mg by mouth daily as needed (ED).   tamsulosin (FLOMAX) 0.4 MG CAPS capsule Take 0.4 mg by mouth daily.   triamcinolone cream (KENALOG) 0.1 % Apply 1 Application topically 2 (two) times daily as needed (itchy scalp).   vitamin C (ASCORBIC ACID) 500 MG tablet Take 1,000 mg by mouth daily.   No facility-administered encounter medications on file as of 12/13/2023.    Social History: Social History   Tobacco Use   Smoking status: Never   Smokeless tobacco: Never   Tobacco comments:    Never smoke 03/04/22  Vaping Use   Vaping status: Never Used  Substance Use Topics   Alcohol use: Yes    Alcohol/week: 1.0 standard drink of alcohol    Types: 1 Cans of beer per week    Comment: monthly   Drug use: No    Family Medical History: Family History  Problem Relation Age of Onset   Heart attack Father    Allergies Mother    Cancer Paternal Aunt        type unknown   Prostate cancer Cousin    Colon  cancer Neg Hx    Pancreatic cancer Neg Hx    Breast cancer Neg Hx     Physical Examination: Vitals:   12/13/23 1007  BP: 108/70    General: Patient is well developed, well nourished, calm, collected, and in no apparent distress. Attention to examination is appropriate.  Respiratory: Patient is breathing without any difficulty.   NEUROLOGICAL:     Awake, alert, oriented to person, place, and time.  Speech is clear and fluent. Fund of knowledge is appropriate.   Cranial Nerves: Pupils equal round and reactive to light.  Facial tone is symmetric.    No lower lumbar tenderness.   No abnormal lesions on exposed skin.   Strength: Side Biceps Triceps Deltoid Interossei Grip Wrist Ext. Wrist Flex.  R 5 5 5 5 5 5 5   L 5 5 5 5 5 5 5    Side Iliopsoas Quads Hamstring PF DF EHL  R 5  5 5 4- 5 5  L 5 5 5  4- 5 5   Reflexes are 2+ and symmetric at the biceps, brachioradialis, patella and achilles.   Hoffman's is absent.  Clonus is not present.   Bilateral upper and lower extremity sensation is intact to light touch.     No pain with ER/IR of both hips.   He can toe stand on right and left foot. He cannot toe stand on right/left foot independently.   Gait is slow and he has slight limp. He states this improves as he walks more.   Medical Decision Making  Imaging: CT myelogram of lumbar spine dated 08/04/23:  CT LUMBAR MYELOGRAM FINDINGS:   T10-11 through L1-2: No significant finding.  No stenosis.   L2-3: Disc degeneration with vacuum phenomenon. Endplate osteophytes and mild bulging of the disc. Mild narrowing of the lateral recesses and neural foramina but no definite neural compression.   L3-4: Disc degeneration with vacuum phenomenon. Extradural abnormality in the left lateral recess that could be due to a combination of left posterolateral disc herniation and left facet arthropathy with synovial cyst formation. This could compress the exiting left L3 nerve as well as the  left L4 nerve in the lateral recess.   L4-5: Disc degeneration with loss of disc height. Solid bridging anterior osteophytes resulting in functional fusion. Endplate osteophytes and mild bulging of the disc. No canal stenosis. Moderate bilateral foraminal narrowing.   L5-S1: Chronic bilateral pars defects with anterolisthesis of 12 mm. Solid bridging anterior osteophytes resulting in fusion at this level. Wide patency of the central spinal canal. Chronic bilateral foraminal narrowing.   IMPRESSION: L2-3: Lateral recess stenosis right more than left. No definite neural compression.   L3-4: Abnormal rocking motion at this level with standing flexion extension. Left-sided extradural defect which appears to be due to a combination of left posterolateral disc herniation and facet arthropathy on the left, possibly with a synovial cyst. Left foraminal and lateral recess narrowing could affect the left L3 and L4 nerves.   L4 to sacrum: Fusion throughout the segment due to bridging anterior osteophytes. Chronic pars defects at L5 with anterolisthesis of 12 mm. No central canal stenosis. There is foraminal narrowing at both L4-5 and L5-S1, but this is presumably chronic and less likely to be compressive as there does not appear to be any motion of the spine in this segment.     Electronically Signed   By: Oneil Officer M.D.   On: 08/04/2023 14:40   Lumbar xrays with flex/ext dated 08/04/23:  FINDINGS: LUMBAR MYELOGRAM FINDINGS:   Standing flexion extension view show greater than 1 cm of anterolisthesis at L5-S1 which appears to be fixed. No motion occurs in the region from L4 the sacrum. There is rocking motion at the L3-4 level. No abnormal motion seen at L1-2 or L2-3.   There is mild stenosis of the lateral recesses at L2-3 right more than left. At L3-4, there is a left-sided extradural defect which could cause left-sided neural compression. No compressive canal stenosis noted  at L4-5 or L5-S1.   CT LUMBAR MYELOGRAM FINDINGS:   T10-11 through L1-2: No significant finding.  No stenosis.   L2-3: Disc degeneration with vacuum phenomenon. Endplate osteophytes and mild bulging of the disc. Mild narrowing of the lateral recesses and neural foramina but no definite neural compression.   L3-4: Disc degeneration with vacuum phenomenon. Extradural abnormality in the left lateral recess that could be due to a combination of left posterolateral  disc herniation and left facet arthropathy with synovial cyst formation. This could compress the exiting left L3 nerve as well as the left L4 nerve in the lateral recess.   L4-5: Disc degeneration with loss of disc height. Solid bridging anterior osteophytes resulting in functional fusion. Endplate osteophytes and mild bulging of the disc. No canal stenosis. Moderate bilateral foraminal narrowing.   L5-S1: Chronic bilateral pars defects with anterolisthesis of 12 mm. Solid bridging anterior osteophytes resulting in fusion at this level. Wide patency of the central spinal canal. Chronic bilateral foraminal narrowing.   IMPRESSION: L2-3: Lateral recess stenosis right more than left. No definite neural compression.   L3-4: Abnormal rocking motion at this level with standing flexion extension. Left-sided extradural defect which appears to be due to a combination of left posterolateral disc herniation and facet arthropathy on the left, possibly with a synovial cyst. Left foraminal and lateral recess narrowing could affect the left L3 and L4 nerves.   L4 to sacrum: Fusion throughout the segment due to bridging anterior osteophytes. Chronic pars defects at L5 with anterolisthesis of 12 mm. No central canal stenosis. There is foraminal narrowing at both L4-5 and L5-S1, but this is presumably chronic and less likely to be compressive as there does not appear to be any motion of the spine in this segment.     Electronically  Signed   By: Oneil Officer M.D.   On: 08/04/2023 14:40       Lumbar xrays dated 05/19/23:  FINDINGS: Stable grade 2 anterolisthesis of L5-S1 secondary to bilateral L5 spondylolysis. Minimal grade 1 retrolisthesis of L2-3 is noted secondary to moderate degenerative disc disease. Mild degenerative disc disease is noted at L1-2 and L3-4. Moderate degenerative disc disease is noted at L5-S1. No fracture is noted.   IMPRESSION: Multilevel degenerative changes as noted above. No acute abnormality seen.     Electronically Signed   By: Lynwood Landy Raddle M.D.   On: 06/02/2023 10:50   I have personally reviewed the images and agree with the above interpretation.  Assessment and Plan: Mr. Klingerman has intermittent left sided LBP with left buttock/hip pain and left groin pain x years. No leg pain. No numbness, tingling, or weakness. No right sided pain.   He has known severe DDD L3-L4 with rocking on flex/ext with compression of left L3 or left L4 nerve. He has auto fusion of L4-L5 along with slip at L5-S1 with auto fusion at this level as well.   Has has 4-/5 strength in bilateral PF- he states right side is chronic x years. Not sure when left sided weakness started.   I think most of his pain is from L3-L4.   Treatment options discussed with patient and following plan made:   - Will get PT notes from [facility]. He said he did 6-8 weeks of PT.  - No improvement with time, medications, or injections.  - Once I get PT notes back, will discuss with Dr. Clois and call him with his recommendations.  - If he is a surgical candidate, he will need cardiac clearance. He is on ELIQUIS  and has cardiac defibrillator.   I spent a total of 45 minutes in face-to-face and non-face-to-face activities related to this patient's care today including review of outside records, review of imaging, review of symptoms, physical exam, discussion of differential diagnosis, discussion of treatment options, and  documentation.   Thank you for involving me in the care of this patient.   Glade Boys PA-C Dept.  of Neurosurgery

## 2023-12-13 ENCOUNTER — Ambulatory Visit (INDEPENDENT_AMBULATORY_CARE_PROVIDER_SITE_OTHER): Admitting: Orthopedic Surgery

## 2023-12-13 ENCOUNTER — Encounter: Payer: Self-pay | Admitting: Orthopedic Surgery

## 2023-12-13 VITALS — BP 108/70 | Ht 74.0 in | Wt 220.2 lb

## 2023-12-13 DIAGNOSIS — M4316 Spondylolisthesis, lumbar region: Secondary | ICD-10-CM

## 2023-12-13 DIAGNOSIS — M5136 Other intervertebral disc degeneration, lumbar region with discogenic back pain only: Secondary | ICD-10-CM

## 2023-12-13 DIAGNOSIS — M5416 Radiculopathy, lumbar region: Secondary | ICD-10-CM

## 2023-12-13 DIAGNOSIS — M4317 Spondylolisthesis, lumbosacral region: Secondary | ICD-10-CM

## 2023-12-13 DIAGNOSIS — M47816 Spondylosis without myelopathy or radiculopathy, lumbar region: Secondary | ICD-10-CM

## 2023-12-13 NOTE — Patient Instructions (Signed)
 It was so nice to see you today. Thank you so much for coming in.    You have some wear and tear in your back. Your body fused your bones at L4-S1, I think your pain is from the level above (L3-L4).   I want to get your PT notes from [facility].   Once I have these, I will review everything with Dr. Clois and call you with his recommendations.   Please do not hesitate to call if you have any questions or concerns. You can also message me in MyChart.   Glade Boys PA-C [phone]     The physicians and staff at [facility] Neurosurgery at [facility] are committed to providing excellent care. You may receive a survey asking for feedback about your experience at our office. We value you your feedback and appreciate you taking the time to to fill it out. The [facility] leadership team is also available to discuss your experience in person, feel free to contact us  [phone].

## 2023-12-18 DIAGNOSIS — Z23 Encounter for immunization: Secondary | ICD-10-CM | POA: Diagnosis not present

## 2023-12-21 DIAGNOSIS — F4322 Adjustment disorder with anxiety: Secondary | ICD-10-CM | POA: Diagnosis not present

## 2023-12-27 DIAGNOSIS — C61 Malignant neoplasm of prostate: Secondary | ICD-10-CM | POA: Diagnosis not present

## 2023-12-28 ENCOUNTER — Ambulatory Visit

## 2023-12-28 ENCOUNTER — Encounter: Payer: Self-pay | Admitting: Family Medicine

## 2023-12-28 ENCOUNTER — Ambulatory Visit (INDEPENDENT_AMBULATORY_CARE_PROVIDER_SITE_OTHER): Admitting: Family Medicine

## 2023-12-28 VITALS — BP 112/78 | HR 75 | Ht 74.0 in | Wt 218.0 lb

## 2023-12-28 DIAGNOSIS — M76891 Other specified enthesopathies of right lower limb, excluding foot: Secondary | ICD-10-CM | POA: Diagnosis not present

## 2023-12-28 DIAGNOSIS — M25551 Pain in right hip: Secondary | ICD-10-CM | POA: Diagnosis not present

## 2023-12-28 DIAGNOSIS — M51369 Other intervertebral disc degeneration, lumbar region without mention of lumbar back pain or lower extremity pain: Secondary | ICD-10-CM | POA: Diagnosis not present

## 2023-12-28 DIAGNOSIS — M1611 Unilateral primary osteoarthritis, right hip: Secondary | ICD-10-CM | POA: Diagnosis not present

## 2023-12-28 NOTE — Patient Instructions (Addendum)
 Hip flexor tendonitis Ice after activity See me in 2-3 months

## 2023-12-28 NOTE — Assessment & Plan Note (Signed)
 I believe the patient has had more of a hip flexor tendinitis noted.  Seems to be right greater than left.  Discussed with patient having the pain worse after activity and rest and then actually truly with the activity makes this feel more like a tendinopathy than truly any arthritic changes.  Does have a history though also of prostate cancer so have a low partial to get advanced imaging if necessary.  Discussed icing regimen and home exercises otherwise, patient declined formal physical therapy today.  Work with the Event organiser to learn in greater detail.  Follow-up again in 6 to 8 weeks

## 2023-12-28 NOTE — Progress Notes (Signed)
 [name] [address] [place] [address] Phone: [phone] Subjective:   Robert Ochoa, am serving as a scribe for Dr. Arthea Claudene.  I'm seeing this patient by the request  of:  [name], MD  CC: Back and leg pain.  YEP:Dlagzrupcz  Robert Ochoa is a 78 y.o. male coming in with complaint of back and leg pain.  Has had lumbar radiculopathy.  Was referred to neurosurgery. Pain in lumbar spine has gradually improved over past 2 months.   Pain in anterior R hip is worsening. Pain will radiate down to his knee and can wrap around to his R glute. [facility] Feels like his hip is going to give away on him and cause him to fall after he gets up from sitting or lying down. After he takes a few steps the sharp pain dissipates.      Imaging: CT myelogram of lumbar spine dated 08/04/23:  CT LUMBAR MYELOGRAM FINDINGS:    IMPRESSION: L2-3: Lateral recess stenosis right more than left. No definite neural compression.   L3-4: Abnormal rocking motion at this level with standing flexion extension. Left-sided extradural defect which appears to be due to a combination of left posterolateral disc herniation and facet arthropathy on the left, possibly with a synovial cyst. Left foraminal and lateral recess narrowing could affect the left L3 and L4 nerves.   L4 to sacrum: Fusion throughout the segment due to bridging anterior osteophytes. Chronic pars defects at L5 with nterolisthesis of 12mm. No central canal stenosis. There is foraminal narrowing at bothL4-5 and L5-S1, but this is presumably chronic and less likely to becompressive as there does not appear to be any motion of the spinein this segment.  Past Medical History:  Diagnosis Date   Arrhythmogenic right ventricular dysplas [facility])    Arrhythmogenic right ventricular dysplasia (HCC)    Hx of VT s/p dual-chamber St. Jude ICD. Cardiac MRI 2010 showed  moderately dilated RV with hypokinesis, LV EF 47%. EF 50-55% 2010 but down to 45% 05/2011. Cath 01/2009 without significant CAD.   Atrial fibrillation or flutter    Paroxysmal. Started on Pradaxa  05/2011.   Cardiac defibrillator in place    LV dysfunction    per echo 10/2011; EF 35%  // Echo 2/18: EF 40-45, diff HK, Gr 1 DD, mild dilated aortic root, mod LAE, mod RVE, mild reduced RVSF, mod RAE   Prostate cancer (HCC)    Septic shock(785.52) 10-27-11 cause unknown   with associated respiratory/renal failure/PAF/bradycardia   Ventricular tachycardia (HCC)    ICD discharges 05/2011 for this & AF-RVR - started on amiodarone  then.   Wears glasses    Past Surgical History:  Procedure Laterality Date   APPENDECTOMY  2002   ATRIAL FIBRILLATION ABLATION N/A 02/04/2022   Procedure: ATRIAL FIBRILLATION ABLATION;  Surgeon: [name], MD;  Location: [facility] INVASIVE CV LAB;  Service: Cardiovascular;  Laterality: N/A;   CARDIAC DEFIBRILLATOR PLACEMENT  02/19/09    St,. Jude    CARDIOVERSION N/A 05/28/2022   Procedure: CARDIOVERSION;  Surgeon: [name], MD;  Location: [facility] ENDOSCOPY;  Service: Cardiovascular;  Laterality: N/A;   ICD GENERATOR CHANGEOUT N/A 03/27/2021   Procedure: ICD GENERATOR CHANGEOUT;  Surgeon: [name], MD;  Location: [facility] INVASIVE CV LAB;  Service: Cardiovascular;  Laterality: N/A;   IMPLANTABLE CARDIOVERTER DEFIBRILLATOR (ICD) GENERATOR CHANGE N/A 03/01/2013   Procedure: ICD GENERATOR CHANGE;  Surgeon: Elspeth BROCKS Sage, MD;  Location: [facility] CATH LAB;  Service: Cardiovascular;  Laterality: N/A;   IMPLANTABLE CARDIOVERTER DEFIBRILLATOR GENERATOR CHANGE  03/01/13   for premature battery depletetion.  Now has a SJM Ellipse DR device placed by Dr Fernande   PROSTATE BIOPSY  11/2019   RADIOACTIVE SEED IMPLANT N/A 04/30/2020   Procedure: RADIOACTIVE SEED IMPLANT/BRACHYTHERAPY IMPLANT;  Surgeon: [name], MD;  Location: [facility];  Service: Urology;   Laterality: N/A;   SPACE OAR INSTILLATION N/A 04/30/2020   Procedure: SPACE OAR INSTILLATION;  Surgeon: [name], MD;  Location: [facility];  Service: Urology;  Laterality: N/A;   Social History   Socioeconomic History   Marital status: Single    Spouse name: Not on file   Number of children: 1   Years of education: Not on file   Highest education level: Not on file  Occupational History    Employer: [employer]    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 Self employed.    Social Drivers of Corporate investment banker Strain: Not on file  Food Insecurity: Not on file  Transportation Needs: Not on file  Physical Activity: Not on file  Stress: Not on file  Social Connections: Not on file   No Known Allergies Family History  Problem Relation Age of Onset   Heart attack Father    Allergies Mother    Cancer Paternal Aunt        type unknown   Prostate cancer Cousin    Colon cancer Neg Hx    Pancreatic cancer Neg Hx    Breast cancer Neg Hx      Current Outpatient Medications (Cardiovascular):    amiodarone  (PACERONE ) 100 MG tablet, TAKE 1 TABLET(100 MG) BY MOUTH DAILY   furosemide  (LASIX ) 20 MG tablet, TAKE 1 TABLET BY MOUTH DAILY AS NEEDED FOR SWELLING   lisinopril  (ZESTRIL ) 10 MG tablet, TAKE 2 TABLETS BY MOUTH EVERY DAY   sildenafil  (REVATIO ) 20 MG tablet, Take 20-100 mg by mouth daily as needed (ED).    Current Outpatient Medications (Hematological):    ELIQUIS  5 MG  TABS tablet, TAKE 1 TABLET BY MOUTH TWICE DAILY  Current Outpatient Medications (Other):    Calcium-Magnesium -Zinc (CAL-MAG-ZINC PO), Take 1 tablet by mouth daily.   Carboxymethylcellul-Glycerin (LUBRICATING EYE DROPS OP), Place 1 drop into both eyes daily as needed (dry eyes).   Omega-3 Fatty Acids (OMEGA 3 PO), Take 1,600 mg by mouth daily.   tamsulosin (FLOMAX) 0.4 MG CAPS capsule, Take 0.4 mg by mouth daily.   triamcinolone cream (KENALOG) 0.1 %, Apply 1 Application topically 2 (two) times daily as needed (itchy scalp).   vitamin C (ASCORBIC ACID) 500 MG tablet, Take 1,000 mg by mouth daily.   Reviewed prior external information including notes and imaging from  primary care provider As well as notes that were available from care everywhere and other healthcare systems.  Past  medical history, social, surgical and family history all reviewed in electronic medical record.  No pertanent information unless stated regarding to the chief complaint.   Review of Systems:  No headache, visual changes, nausea, vomiting, diarrhea, constipation, dizziness, abdominal pain, skin rash, fevers, chills, night sweats, weight loss, swollen lymph nodes, body aches, joint swelling, chest pain, shortness of breath, mood changes. POSITIVE muscle aches  Objective  Blood pressure 112/78, pulse 75, height 6' 2 (1.88 m), weight 218 lb (98.9 kg), SpO2 97%.   General: No apparent distress alert and oriented x3 mood and affect normal, dressed appropriately.  HEENT: Pupils equal, extraocular movements intact  Respiratory: Patient's speak in full sentences and does not appear short of breath  Cardiovascular: No lower extremity edema, non tender, no erythema  Severe tightness noted of the hip flexors bilaterally right greater than left.  Does have difficulty with straightening back out completely.  Positive FABER test with pain in the groin area.  Negative straight leg test noted.  Some limited internal range of  motion of the hip but very symmetric to the contralateral side.   [id number]; 15 additional minutes spent for Therapeutic exercises as stated in above notes.  This included exercises focusing on stretching, strengthening, with significant focus on eccentric aspects.   Long term goals include an improvement in range of motion, strength, endurance as well as avoiding reinjury. Patient's frequency would include in 1-2 times a day, 3-5 times a week for a duration of 6-12 weeks. Hip strengthening exercises which included:  Pelvic tilt/bracing to help with proper recruitment of the lower abs and pelvic floor muscles  Glute strengthening to properly contract glutes without over-engaging low back and hamstrings - prone hip extension and glute bridge exercises Proper stretching techniques to increase effectiveness for the hip flexors, groin, quads, piriformic and low back when appropriate   Proper technique shown and discussed handout in great detail with ATC.  All questions were discussed and answered.     Impression and Recommendations:    The above documentation has been reviewed and is accurate and complete Kaitland Lewellyn M Aleeta Schmaltz, DO

## 2023-12-31 ENCOUNTER — Ambulatory Visit: Payer: Self-pay | Admitting: Family Medicine

## 2024-01-01 DIAGNOSIS — Z23 Encounter for immunization: Secondary | ICD-10-CM | POA: Diagnosis not present

## 2024-01-04 ENCOUNTER — Other Ambulatory Visit (HOSPITAL_COMMUNITY): Payer: Self-pay | Admitting: Urology

## 2024-01-04 ENCOUNTER — Other Ambulatory Visit: Payer: Self-pay

## 2024-01-04 DIAGNOSIS — F4322 Adjustment disorder with anxiety: Secondary | ICD-10-CM | POA: Diagnosis not present

## 2024-01-04 DIAGNOSIS — R9721 Rising PSA following treatment for malignant neoplasm of prostate: Secondary | ICD-10-CM

## 2024-01-04 DIAGNOSIS — M25551 Pain in right hip: Secondary | ICD-10-CM

## 2024-01-04 DIAGNOSIS — C61 Malignant neoplasm of prostate: Secondary | ICD-10-CM

## 2024-01-05 ENCOUNTER — Ambulatory Visit (INDEPENDENT_AMBULATORY_CARE_PROVIDER_SITE_OTHER): Payer: Medicare Other

## 2024-01-05 DIAGNOSIS — I428 Other cardiomyopathies: Secondary | ICD-10-CM | POA: Diagnosis not present

## 2024-01-06 ENCOUNTER — Ambulatory Visit: Admitting: Podiatry

## 2024-01-06 DIAGNOSIS — M7751 Other enthesopathy of right foot: Secondary | ICD-10-CM | POA: Diagnosis not present

## 2024-01-06 LAB — CUP PACEART REMOTE DEVICE CHECK
Battery Remaining Longevity: 55 mo
Battery Remaining Percentage: 64 %
Battery Voltage: 2.96 V
Brady Statistic RV Percent Paced: 65 %
Date Time Interrogation Session: 20251008020017
HighPow Impedance: 49 Ohm
HighPow Impedance: 49 Ohm
Implantable Lead Connection Status: 753985
Implantable Lead Connection Status: 753985
Implantable Lead Implant Date: 20101123
Implantable Lead Implant Date: 20101123
Implantable Lead Location: 753859
Implantable Lead Location: 753860
Implantable Lead Model: 7121
Implantable Pulse Generator Implant Date: 20201229
Lead Channel Impedance Value: 340 Ohm
Lead Channel Impedance Value: 400 Ohm
Lead Channel Pacing Threshold Amplitude: 1.25 V
Lead Channel Pacing Threshold Pulse Width: 0.6 ms
Lead Channel Sensing Intrinsic Amplitude: 3.4 mV
Lead Channel Sensing Intrinsic Amplitude: 8 mV
Lead Channel Setting Pacing Amplitude: 2.5 V
Lead Channel Setting Pacing Pulse Width: 0.6 ms
Lead Channel Setting Sensing Sensitivity: 0.5 mV
Pulse Gen Serial Number: 8907499

## 2024-01-07 ENCOUNTER — Telehealth: Payer: Self-pay | Admitting: Family Medicine

## 2024-01-07 NOTE — Progress Notes (Signed)
 He presents today for follow-up of his bursitis plantar aspect of the fifth metatarsal of the right foot.  And the benign lesion that goes along with that.  He states that really has not been hurting lately.  Objective: Vital signs stable he is alert and oriented x 3.  There is no sign of the lesion and no palpable bursa present.  Assessment: Normal plantar foot.  Plan: Follow-up with me as needed.

## 2024-01-07 NOTE — Telephone Encounter (Signed)
 Patient called and stated tylenol  is not doing anything for the pain in his right leg, knee and right hip into the buttocks. He is afraid to take too much tylenol . He does not want anything too strong is what he said, just stronger than tylenol . He can not take ibuprofen because he takes eliquois.  He is asking if something can be prescribed for the pain. Please advise.

## 2024-01-07 NOTE — Progress Notes (Signed)
 Remote PPM Transmission

## 2024-01-09 ENCOUNTER — Ambulatory Visit: Payer: Self-pay | Admitting: Cardiology

## 2024-01-09 MED ORDER — GABAPENTIN 100 MG PO CAPS: 200.0000 mg | ORAL_CAPSULE | Freq: Every day | ORAL | 3 refills | Status: DC

## 2024-01-10 ENCOUNTER — Other Ambulatory Visit: Payer: Self-pay

## 2024-01-10 MED ORDER — GABAPENTIN 100 MG PO CAPS: 200.0000 mg | ORAL_CAPSULE | Freq: Every day | ORAL | 0 refills | Status: AC

## 2024-01-10 NOTE — Progress Notes (Signed)
 Remote PPM Transmission

## 2024-01-11 ENCOUNTER — Encounter (HOSPITAL_COMMUNITY)
Admission: RE | Admit: 2024-01-11 | Discharge: 2024-01-11 | Disposition: A | Discharge status: Home / Self Care | Source: Ambulatory Visit | Attending: Urology | Admitting: Urology

## 2024-01-11 ENCOUNTER — Ambulatory Visit: Admitting: Family Medicine

## 2024-01-11 DIAGNOSIS — R9721 Rising PSA following treatment for malignant neoplasm of prostate: Secondary | ICD-10-CM | POA: Diagnosis not present

## 2024-01-11 DIAGNOSIS — C61 Malignant neoplasm of prostate: Secondary | ICD-10-CM | POA: Diagnosis not present

## 2024-01-11 DIAGNOSIS — E041 Nontoxic single thyroid nodule: Secondary | ICD-10-CM | POA: Diagnosis not present

## 2024-01-11 DIAGNOSIS — N323 Diverticulum of bladder: Secondary | ICD-10-CM | POA: Diagnosis not present

## 2024-01-11 DIAGNOSIS — K4090 Unilateral inguinal hernia, without obstruction or gangrene, not specified as recurrent: Secondary | ICD-10-CM | POA: Diagnosis not present

## 2024-01-11 DIAGNOSIS — I700 Atherosclerosis of aorta: Secondary | ICD-10-CM | POA: Diagnosis not present

## 2024-01-11 MED ORDER — FLOTUFOLASTAT F 18 GALLIUM 296-5846 MBQ/ML IV SOLN
8.5000 | Freq: Once | INTRAVENOUS | Status: AC
  Administered 2024-01-11: 8.5 via INTRAVENOUS

## 2024-01-12 ENCOUNTER — Ambulatory Visit
Admission: RE | Admit: 2024-01-12 | Discharge: 2024-01-12 | Disposition: A | Discharge status: Home / Self Care | Source: Ambulatory Visit | Attending: Family Medicine | Admitting: Family Medicine

## 2024-01-12 DIAGNOSIS — M25551 Pain in right hip: Secondary | ICD-10-CM | POA: Diagnosis not present

## 2024-01-12 DIAGNOSIS — M1611 Unilateral primary osteoarthritis, right hip: Secondary | ICD-10-CM | POA: Diagnosis not present

## 2024-01-14 ENCOUNTER — Other Ambulatory Visit: Payer: Self-pay | Admitting: Internal Medicine

## 2024-01-14 ENCOUNTER — Ambulatory Visit: Payer: Self-pay | Admitting: Family Medicine

## 2024-01-14 ENCOUNTER — Other Ambulatory Visit: Payer: Self-pay | Admitting: Family Medicine

## 2024-01-14 DIAGNOSIS — G8929 Other chronic pain: Secondary | ICD-10-CM

## 2024-01-14 DIAGNOSIS — I480 Paroxysmal atrial fibrillation: Secondary | ICD-10-CM

## 2024-01-14 NOTE — Telephone Encounter (Signed)
 Prescription refill request for Eliquis  received. Indication:afib Last office visit:5/25 Scr:1.17  5/25 Age: 78 Weight:98.9  kg  Prescription refilled

## 2024-01-14 NOTE — Telephone Encounter (Signed)
 Referral placed.

## 2024-01-18 DIAGNOSIS — F4322 Adjustment disorder with anxiety: Secondary | ICD-10-CM | POA: Diagnosis not present

## 2024-01-20 ENCOUNTER — Other Ambulatory Visit: Payer: Self-pay | Admitting: Family Medicine

## 2024-01-20 DIAGNOSIS — M533 Sacrococcygeal disorders, not elsewhere classified: Secondary | ICD-10-CM

## 2024-01-20 DIAGNOSIS — G8929 Other chronic pain: Secondary | ICD-10-CM

## 2024-01-20 NOTE — Telephone Encounter (Signed)
 Patient called stating that he spoke to [place] at [facility] but they said that they did not have any orders for him?

## 2024-01-20 NOTE — Addendum Note (Signed)
 Addended by: GALA BERWYN CROME on: 01/20/2024 01:37 PM   Modules accepted: Orders

## 2024-01-21 ENCOUNTER — Telehealth: Payer: Self-pay | Admitting: Radiation Oncology

## 2024-01-21 NOTE — Telephone Encounter (Signed)
 Spoke to pt to schedule consultation with Dr. Patrcia, pt agreeable to next available (11/3). Pt had questions related to hormone therapy that was recommended by Dr. Devere as potential tx option. Pt was advised that type of tx is done through different dept but I could loop in prostate navigator to call him next week after reviewing Dr. Maralyn notes to discuss this with pt. Pt agreeable to this. Cc'd Prostate Navigator to assist.

## 2024-01-24 ENCOUNTER — Telehealth: Payer: Self-pay

## 2024-01-24 MED ORDER — METOPROLOL SUCCINATE ER 50 MG PO TB24: 50.0000 mg | ORAL_TABLET | Freq: Every day | ORAL | 3 refills | Status: AC

## 2024-01-24 NOTE — Progress Notes (Signed)
 RN spoke with patient after receiving message that patient had questions. RN answered all questions, no additional needs at this time.  Will have rad onc consult 11/4 as scheduled.

## 2024-01-24 NOTE — Progress Notes (Signed)
 GU Location of Tumor / Histology: Prostate Ca (recurrence)  PSA 2.2 on 12/27/2023  Robert Ochoa presented as referral from Dr. Lonni Han [facility]) elevated PSA.  01/11/2024 Dr. Lonni Han NM PET (PSMA) Skull to Mid Thigh CLINICAL DATA:  WOV Verification; 8.50 mCi F18-POSLUMA @ 1436 IV in RAC//BWM-GT; PSA: 2.2 on 12/27/2023; Per patient: biopsy 6 months ago, no recent surgeries, radioactive seed implantation 04/30/2020.   IMPRESSION: 1. Multifocal prostatic tracer affinity in the setting of radiation seeds, suspicious for residual viable disease. No evidence of tracer-avid metastatic disease. 2. New right-sided thyroid  nodule with tracer affinity (SUV 3.2). Per consensual criteria, this warrants further evaluation with thyroid  ultrasound and consideration for sampling. 3. Pulmonary artery enlargement suggestive of pulmonary arterial hypertension.   Past/Anticipated interventions by urology, if any: NA  Past/Anticipated interventions by medical oncology, if any:  NA  Weight changes, if any: No  IPSS:  7 SHIM:  5  Bowel/Bladder complaints, if any:  No  Nausea/Vomiting, if any: No  Pain issues, if any: 8/10 hip pain when walking or getting up in amnot when sitting still will be getting a steroid shot soon.  Will use assistive device cane as needed.  SAFETY ISSUES: Prior radiation? Yes, Brachytherapy (04/2020) Pacemaker/ICD? Yes, both. Possible current pregnancy? Male  Is the patient on methotrexate? No  Current Complaints / other details: None  30 minutes spent total, including time for meaningful use questions, reviewing medication, as well as spent in face-to-face time in nurse evaluation with the patient.

## 2024-01-24 NOTE — Telephone Encounter (Signed)
 Alert remote transmission:  VT/VF episode occurred, successful ATP therapy delivered Event occurred 10/26 @ 23:01, duration 46sec, HR 173, EGM c/w AFL with RVR vs VT, ATP delivered x3, slowing V rate briefly below 171 VT detection.  HR's with upward trend, multiple NSVT and SVT events Presenting rhythm AFL/VP, Eliquis  per EPIC.    _____________________________________________________________________  Robert Ochoa w/ patient regarding recent device alerts. Hx permanent AF. On OAC per EPIC. Episodes c/w AFL RVR vs VT. ATP delivered x3 on 01/23/2024 c/w AFL RVR. Patient states he is asymptomatic for the most part. Patient did notice he felt HR go up at one point during the day on 01/23/2024 but it did go away. States medication compliance.  Reviewed w/ Dr. Inocencio who recommends patient start Toprol -XL 50 mg daily and F/U w/ AF clinic to discuss rhythm/rate control options. Patient aware and agreeable. Pharmacy verified and medication sent to [facility] [address], [place], [place].     Will forward to AF clinic to schedule F/U appointment. Patient informed to contact device clinic for any questions or concerns.

## 2024-01-25 ENCOUNTER — Ambulatory Visit (HOSPITAL_COMMUNITY)
Admission: RE | Admit: 2024-01-25 | Discharge: 2024-01-25 | Disposition: A | Discharge status: Home / Self Care | Source: Ambulatory Visit | Attending: Physician Assistant | Admitting: Physician Assistant

## 2024-01-25 VITALS — BP 144/84 | HR 93 | Ht 74.0 in | Wt 219.0 lb

## 2024-01-25 DIAGNOSIS — D6869 Other thrombophilia: Secondary | ICD-10-CM | POA: Diagnosis not present

## 2024-01-25 DIAGNOSIS — I4891 Unspecified atrial fibrillation: Secondary | ICD-10-CM | POA: Diagnosis not present

## 2024-01-25 DIAGNOSIS — Z79899 Other long term (current) drug therapy: Secondary | ICD-10-CM | POA: Diagnosis not present

## 2024-01-25 DIAGNOSIS — Z5181 Encounter for therapeutic drug level monitoring: Secondary | ICD-10-CM | POA: Diagnosis not present

## 2024-01-25 DIAGNOSIS — I4821 Permanent atrial fibrillation: Secondary | ICD-10-CM | POA: Diagnosis not present

## 2024-01-25 NOTE — Progress Notes (Signed)
 Primary Care Physician: [name], MD Primary Electrophysiologist: Dr Nancey Referring Physician: Dr Kelsie   Neal Trulson is a 78 y.o. male with a history of ARVC, VT, ILD, HLD, LV dysfunction, and atrial fibrillation who presents for follow up in the [facility]. Patient has been maintained on amiodarone . Patient is on Eliquis  for a CHADS2VASC score of 3. The device clinic received an alert for an ongoing afib episode starting 07/21/20. Repeat device interrogation showed he was back in R on 4/25. He was unaware of his arrhythmia, possibly had some mild fatigue but couldn't be sure. Unfortunately, his afib burden continued to increase on his ICD. He was seen by Dr Nancey and underwent afib and flutter ablation on 02/04/22.  Patient was seen by Dr Nancey on 05/12/22 and was in persistent atypical atrial flutter. Patient is s/p DCCV on 05/28/22 with quick return of atrial flutter, amiodarone  increased and set up for another DCCV. He was in SR at the time of his DCCV and so the procedure was cancelled. However, he was back in atrial flutter again two days later. Considered permanent atrial fibrillation/flutter.   Patient returns for follow up for atrial fibrillation and amiodarone  monitoring. The device clinic received an alert for intermittent elevated heart rates in atrial flutter. He was started on Toprol  01/24/24 but has not had a chance to pick it up from the pharmacy yet. He did have tachypalpitations which correlated to his episodes on his ICD. There were no specific triggers that he could identify. No bleeding issues on anticoagulation.   Today, he  denies symptoms of chest pain, shortness of breath, orthopnea, PND, lower extremity edema, dizziness, presyncope, syncope, snoring, daytime somnolence, bleeding, or neurologic sequela. The patient is tolerating medications without difficulties and is otherwise without complaint today.    Atrial Fibrillation Risk  Factors:  he does not have symptoms or diagnosis of sleep apnea. he does not have a history of rheumatic fever.   Atrial Fibrillation Management history:  Previous antiarrhythmic drugs: amiodarone   Previous cardioversions: 05/28/22 Previous ablations: 02/04/22 Anticoagulation history: Eliquis    Past Medical History:  Diagnosis Date   Arrhythmogenic right ventricular dysplas (HCC)    Arrhythmogenic right ventricular dysplasia (HCC)    Hx of VT s/p dual-chamber St. Jude ICD. Cardiac MRI 2010 showed moderately dilated RV with hypokinesis, LV EF 47%. EF 50-55% 2010 but down to 45% 05/2011. Cath 01/2009 without significant CAD.   Atrial fibrillation or flutter    Paroxysmal. Started on Pradaxa  05/2011.   Cardiac defibrillator in place    LV dysfunction    per echo 10/2011; EF 35%  // Echo 2/18: EF 40-45, diff HK, Gr 1 DD, mild dilated aortic root, mod LAE, mod RVE, mild reduced RVSF, mod RAE   Prostate cancer (HCC)    Septic shock(785.52) 10-27-11 cause unknown   with associated respiratory/renal failure/PAF/bradycardia   Ventricular tachycardia (HCC)    ICD discharges 05/2011 for this & AF-RVR - started on amiodarone  then.   Wears glasses     Current Outpatient Medications  Medication Sig Dispense Refill   amiodarone  (PACERONE ) 100 MG tablet TAKE 1 TABLET(100 MG) BY MOUTH DAILY 90 tablet 2   Calcium-Magnesium -Zinc (CAL-MAG-ZINC PO) Take 1 tablet by mouth daily.     Carboxymethylcellul-Glycerin (LUBRICATING EYE DROPS OP) Place 1 drop into both eyes daily as needed (dry eyes).     ELIQUIS  5 MG TABS tablet TAKE 1 TABLET BY MOUTH TWICE DAILY 60 tablet 5   furosemide  (  LASIX ) 20 MG tablet TAKE 1 TABLET BY MOUTH DAILY AS NEEDED FOR SWELLING 30 tablet 3   gabapentin (NEURONTIN) 100 MG capsule Take 2 capsules (200 mg total) by mouth at bedtime. 180 capsule 0   lisinopril  (ZESTRIL ) 10 MG tablet TAKE 2 TABLETS BY MOUTH EVERY DAY 180 tablet 1   metoprolol  succinate (TOPROL -XL) 50 MG 24 hr tablet  Take 1 tablet (50 mg total) by mouth daily. Take with or immediately following a meal. 90 tablet 3   Omega-3 Fatty Acids (OMEGA 3 PO) Take 1,600 mg by mouth daily.     sildenafil  (REVATIO ) 20 MG tablet Take 20-100 mg by mouth daily as needed (ED).     tamsulosin (FLOMAX) 0.4 MG CAPS capsule Take 0.4 mg by mouth daily.     triamcinolone cream (KENALOG) 0.1 % Apply 1 Application topically 2 (two) times daily as needed (itchy scalp). (Patient taking differently: Apply 1 Application topically as needed (itchy scalp).)     vitamin C (ASCORBIC ACID) 500 MG tablet Take 1,000 mg by mouth daily.     No current facility-administered medications for this encounter.    ROS- All systems are reviewed and negative except as per the HPI above.  Physical Exam: Vitals:   01/25/24 1350  Weight: 99.3 kg  Height: 6' 2 (1.88 m)    GEN: Well nourished, well developed in no acute distress CARDIAC: Irregularly irregular rate and rhythm, no murmurs, rubs, gallops RESPIRATORY:  Clear to auscultation without rales, wheezing or rhonchi  ABDOMEN: Soft, non-tender, non-distended EXTREMITIES:  No edema; No deformity    Wt Readings from Last 3 Encounters:  01/25/24 99.3 kg  12/28/23 98.9 kg  12/13/23 99.9 kg    EKG today demonstrates  Atypical atrial flutter with variable block Vent. rate 93 BPM PR interval * ms QRS duration 112 ms QT/QTcB 362/450 ms   Echo 02/14/21 demonstrated   1. Left ventricular ejection fraction, by estimation, is 55%. The left  ventricle has normal function. The left ventricle has no regional wall  motion abnormalities. There is mild concentric left ventricular  hypertrophy. Left ventricular diastolic parameters are consistent with Grade I diastolic dysfunction (impaired relaxation).   2. Right ventricular systolic function is normal. The right ventricular  size is normal. There is normal pulmonary artery systolic pressure.   3. The mitral valve is normal in structure. No  evidence of mitral valve  regurgitation. No evidence of mitral stenosis.   4. The aortic valve is tricuspid. Aortic valve regurgitation is not  visualized. Aortic valve sclerosis is present, with no evidence of aortic  valve stenosis.   Comparison(s): A prior study was performed on 06/10/20. No significant  change from prior study.   Epic records are reviewed at length today  CHA2DS2-VASc Score = 4  The patient's score is based upon: CHF History: 1 HTN History: 1 Diabetes History: 0 Stroke History: 0 Vascular Disease History: 0 Age Score: 2 Gender Score: 0       ASSESSMENT AND PLAN: Permanent Atrial Fibrillation/atrial flutter (ICD10:  I48.11) The patient's CHA2DS2-VASc score is 4, indicating a 4.8% annual risk of stroke.   S/p afib and flutter ablation 02/04/22 Continue amiodarone  100 mg daily Start Toprol  50 mg daily  Continue Eliquis  5 mg BID He is interested in discussing PFA with Dr Inocencio. Unsure if he would be a candidate given how long he has been out of rhythm.   Secondary Hypercoagulable State (ICD10:  D68.69) The patient is at significant risk for stroke/thromboembolism  based upon his CHA2DS2-VASc Score of 4.  Continue Apixaban  (Eliquis ). No bleeding issues.   High Risk Medication Monitoring (ICD 10: J342684) Patient requires ongoing monitoring for anti-arrhythmic medication which has the potential to cause life threatening arrhythmias. Intervals on ECG acceptable for amiodarone  monitoring. Check cmet/TSH today.     ARVC S/p ICD, followed by Dr Inocencio   VT Continue amiodarone  100 mg daily   Follow up with Dr Inocencio in 3 months.    Daril Kicks PA-C Afib [facility] [address] [place], [place] [address] [phone] 01/25/2024 2:00 PM

## 2024-01-26 ENCOUNTER — Ambulatory Visit (HOSPITAL_COMMUNITY): Payer: Self-pay | Admitting: Physician Assistant

## 2024-01-26 LAB — TSH: TSH: 1.15 u[IU]/mL (ref 0.450–4.500)

## 2024-01-26 LAB — COMPREHENSIVE METABOLIC PANEL WITH GFR
ALT: 20 IU/L (ref 0–44)
AST: 23 IU/L (ref 0–40)
Albumin: 4.1 g/dL (ref 3.8–4.8)
Alkaline Phosphatase: 91 IU/L (ref 47–123)
BUN/Creatinine Ratio: 21 (ref 10–24)
BUN: 23 mg/dL (ref 8–27)
Bilirubin Total: 0.7 mg/dL (ref 0.0–1.2)
CO2: 20 mmol/L (ref 20–29)
Calcium: 9.4 mg/dL (ref 8.6–10.2)
Chloride: 103 mmol/L (ref 96–106)
Creatinine, Ser: 1.08 mg/dL (ref 0.76–1.27)
Globulin, Total: 2.6 g/dL (ref 1.5–4.5)
Glucose: 89 mg/dL (ref 70–99)
Potassium: 4.7 mmol/L (ref 3.5–5.2)
Sodium: 138 mmol/L (ref 134–144)
Total Protein: 6.7 g/dL (ref 6.0–8.5)
eGFR: 70 mL/min/1.73 (ref >59)

## 2024-01-31 ENCOUNTER — Ambulatory Visit
Admission: RE | Admit: 2024-01-31 | Discharge: 2024-01-31 | Disposition: A | Discharge status: Home / Self Care | Source: Ambulatory Visit | Attending: Radiation Oncology | Admitting: Radiation Oncology

## 2024-01-31 ENCOUNTER — Encounter: Payer: Self-pay | Admitting: Cardiovascular Disease

## 2024-01-31 ENCOUNTER — Encounter: Payer: Self-pay | Admitting: Radiation Oncology

## 2024-01-31 DIAGNOSIS — I428 Other cardiomyopathies: Secondary | ICD-10-CM | POA: Insufficient documentation

## 2024-01-31 DIAGNOSIS — R9721 Rising PSA following treatment for malignant neoplasm of prostate: Secondary | ICD-10-CM | POA: Diagnosis not present

## 2024-01-31 DIAGNOSIS — Z8042 Family history of malignant neoplasm of prostate: Secondary | ICD-10-CM | POA: Diagnosis not present

## 2024-01-31 DIAGNOSIS — Z79899 Other long term (current) drug therapy: Secondary | ICD-10-CM | POA: Insufficient documentation

## 2024-01-31 DIAGNOSIS — Z7901 Long term (current) use of anticoagulants: Secondary | ICD-10-CM | POA: Diagnosis not present

## 2024-01-31 DIAGNOSIS — I700 Atherosclerosis of aorta: Secondary | ICD-10-CM | POA: Insufficient documentation

## 2024-01-31 DIAGNOSIS — I4891 Unspecified atrial fibrillation: Secondary | ICD-10-CM | POA: Diagnosis not present

## 2024-01-31 DIAGNOSIS — C61 Malignant neoplasm of prostate: Secondary | ICD-10-CM

## 2024-01-31 DIAGNOSIS — R000 Tachycardia, unspecified: Secondary | ICD-10-CM | POA: Diagnosis not present

## 2024-01-31 DIAGNOSIS — M169 Osteoarthritis of hip, unspecified: Secondary | ICD-10-CM | POA: Diagnosis not present

## 2024-01-31 DIAGNOSIS — K449 Diaphragmatic hernia without obstruction or gangrene: Secondary | ICD-10-CM | POA: Insufficient documentation

## 2024-01-31 DIAGNOSIS — Z809 Family history of malignant neoplasm, unspecified: Secondary | ICD-10-CM | POA: Insufficient documentation

## 2024-01-31 DIAGNOSIS — Z923 Personal history of irradiation: Secondary | ICD-10-CM | POA: Insufficient documentation

## 2024-01-31 DIAGNOSIS — Z95810 Presence of automatic (implantable) cardiac defibrillator: Secondary | ICD-10-CM | POA: Insufficient documentation

## 2024-01-31 HISTORY — DX: Elevated prostate specific antigen (PSA): R97.20

## 2024-01-31 NOTE — Progress Notes (Signed)
 Radiation Oncology         [phone] ________________________________  Outpatient Consultation  Name: Robert Ochoa MRN: [id number]  Date of Service: 01/31/2024 DOB: July 15, 1945  CC:[name], MD  Devere Lonni Fire*   REFERRING PHYSICIAN: Devere Lonni Fire*  DIAGNOSIS: 78 y.o. man with biochemically recurrent prostate cancer with PSA of 2.2 s/p brachytherapy in 2022 for treatment of Gleason 4+3 adenocarcinoma of the prostate.    ICD-10-CM   1. Malignant neoplasm of prostate (HCC)  C61       HISTORY OF PRESENT ILLNESS: Robert Ochoa is a 78 y.o. male seen at the request of Dr. Devere. He is well known to our service having previously undergone brachytherapy for treatment of his Gleason 4+3 prostate cancer on 04/30/20. His pretreatment PSA was 5.98 and reached a nadir of 0.25 in 09/2021.  Unfortunately, the patient's PSA level has been gradually rising since that time, at 0.62 in 11/2022, and 0.99 in 03/2023, which prompted a repeat prostate biopsy. TRUSPBx was performed on 06/21/23 and pathology was negative, showing only evidence of radiation changes, without active disease. His most recent PSA from 12/27/23 showed a further rise to 2.2 which prompted a restaging PSMA PET scan that was performed on 01/11/24;, We personally reviewed the imaging with the patient today which appears to show tracer activity in the right seminal vesicle, suspicious for local recurrence but no evidence of tracer-avid metastatic disease or lymphadenopathy. Incidentally noted was a new right-sided thyroid  nodule with tracer affinity that warrants further evaluation with an U/S and likely biopsy.  PREVIOUS RADIATION THERAPY: Yes  04/30/20:  Insertion of radioactive I-125 seeds into the prostate gland; 145 Gy, definitive therapy with placement of SpaceOAR gel.   PAST MEDICAL HISTORY:  Past Medical History:  Diagnosis Date   Arrhythmogenic right ventricular dysplas (HCC)    Arrhythmogenic right  ventricular dysplasia (HCC)    Hx of VT s/p dual-chamber St. Jude ICD. Cardiac MRI 2010 showed moderately dilated RV with hypokinesis, LV EF 47%. EF 50-55% 2010 but down to 45% 05/2011. Cath 01/2009 without significant CAD.   Atrial fibrillation or flutter    Paroxysmal. Started on Pradaxa  05/2011.   Cardiac defibrillator in place    LV dysfunction    per echo 10/2011; EF 35%  // Echo 2/18: EF 40-45, diff HK, Gr 1 DD, mild dilated aortic root, mod LAE, mod RVE, mild reduced RVSF, mod RAE   Prostate cancer (HCC)    Septic shock(785.52) 10-27-11 cause unknown   with associated respiratory/renal failure/PAF/bradycardia   Ventricular tachycardia (HCC)    ICD discharges 05/2011 for this & AF-RVR - started on amiodarone  then.   Wears glasses       PAST SURGICAL HISTORY: Past Surgical History:  Procedure Laterality Date   APPENDECTOMY  2002   ATRIAL FIBRILLATION ABLATION N/A 02/04/2022   Procedure: ATRIAL FIBRILLATION ABLATION;  Surgeon: [name], MD;  Location: [facility];  Service: Cardiovascular;  Laterality: N/A;   CARDIAC DEFIBRILLATOR PLACEMENT  02/19/09    St,. Jude    CARDIOVERSION N/A 05/28/2022   Procedure: CARDIOVERSION;  Surgeon: [name], MD;  Location: [facility];  Service: Cardiovascular;  Laterality: N/A;   ICD GENERATOR CHANGEOUT N/A 03/27/2021   Procedure: ICD GENERATOR CHANGEOUT;  Surgeon: [name], MD;  Location: [facility] CV [facility];  Service: Cardiovascular;  Laterality: N/A;   IMPLANTABLE CARDIOVERTER DEFIBRILLATOR (ICD) GENERATOR CHANGE N/A 03/01/2013   Procedure: ICD GENERATOR CHANGE;  Surgeon: Elspeth BROCKS Sage, MD;  Location:  [facility] CATH LAB;  Service: Cardiovascular;  Laterality: N/A;   IMPLANTABLE CARDIOVERTER DEFIBRILLATOR GENERATOR CHANGE  03/01/13   for premature battery depletetion.  Now has a SJM Ellipse DR device placed by Dr Fernande   PROSTATE BIOPSY  11/2019   RADIOACTIVE SEED IMPLANT N/A 04/30/2020   Procedure: RADIOACTIVE SEED  IMPLANT/BRACHYTHERAPY IMPLANT;  Surgeon: [name], MD;  Location: [facility];  Service: Urology;  Laterality: N/A;   SPACE OAR INSTILLATION N/A 04/30/2020   Procedure: SPACE OAR INSTILLATION;  Surgeon: [name], MD;  Location: [facility];  Service: Urology;  Laterality: N/A;    FAMILY HISTORY:  Family History  Problem Relation Age of Onset   Heart attack Father    Allergies Mother    Cancer Paternal Aunt        type unknown   Prostate cancer Cousin    Colon cancer Neg Hx    Pancreatic cancer Neg Hx    Breast cancer Neg Hx     SOCIAL HISTORY:  Social History   Socioeconomic History   Marital status: Single    Spouse name: Not on file   Number of children: 1   Years of education: Not on file   Highest education level: Not on file  Occupational History    Employer: [employer]    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 Self employed.    Social Drivers of Corporate Investment Banker Strain: Not on file  Food Insecurity: Not on file  Transportation Needs: Not on file  Physical Activity: Not on file  Stress: Not on file  Social Connections: Not on file  Intimate Partner Violence: Not on file    ALLERGIES: Patient has no known allergies.  MEDICATIONS:  Current Outpatient Medications  Medication Sig Dispense Refill   amiodarone  (PACERONE ) 100 MG tablet TAKE 1 TABLET(100 MG) BY MOUTH DAILY 90 tablet 2   Calcium-Magnesium -Zinc (CAL-MAG-ZINC  PO) Take 1 tablet by mouth daily.     Carboxymethylcellul-Glycerin (LUBRICATING EYE DROPS OP) Place 1 drop into both eyes daily as needed (dry eyes).     ELIQUIS  5 MG TABS tablet TAKE 1 TABLET BY MOUTH TWICE DAILY 60 tablet 5   furosemide  (LASIX ) 20 MG tablet TAKE 1 TABLET BY MOUTH DAILY AS NEEDED FOR SWELLING 30 tablet 3   gabapentin (NEURONTIN) 100 MG capsule Take 2 capsules (200 mg total) by mouth at bedtime. 180 capsule 0   lisinopril  (ZESTRIL ) 10 MG tablet TAKE 2 TABLETS BY MOUTH EVERY DAY 180 tablet 1   metoprolol  succinate (TOPROL -XL) 50 MG 24 hr tablet Take 1 tablet (50 mg total) by mouth daily. Take with or immediately following a meal. 90 tablet 3   Omega-3 Fatty Acids (OMEGA 3 PO) Take 1,600 mg by mouth daily.     sildenafil  (REVATIO ) 20 MG tablet Take 20-100 mg by mouth daily as needed (ED).  tamsulosin (FLOMAX) 0.4 MG CAPS capsule Take 0.4 mg by mouth daily.     triamcinolone cream (KENALOG) 0.1 % Apply 1 Application topically 2 (two) times daily as needed (itchy scalp). (Patient taking differently: Apply 1 Application topically as needed (itchy scalp).)     vitamin C (ASCORBIC ACID) 500 MG tablet Take 1,000 mg by mouth daily.     No current facility-administered medications for this encounter.    REVIEW OF SYSTEMS:  On review of systems, the patient reports that he is doing well overall. He denies any chest pain, shortness of breath, cough, fevers, chills, night sweats, unintended weight changes. He denies any bowel or bladder disturbances, and denies abdominal pain, nausea or vomiting. He denies any new musculoskeletal or joint aches or pains. A complete review of systems is obtained and is otherwise negative.    PHYSICAL EXAM:  Wt Readings from Last 3 Encounters:  01/31/24 (P) 217 lb 4 oz (98.5 kg)  01/25/24 219 lb (99.3 kg)  12/28/23 218 lb (98.9 kg)   Temp Readings from Last 3 Encounters:  01/31/24 (!) (P) 97.1 F (36.2 C) ((P) Temporal)  06/27/23 99 F (37.2 C)   07/13/22 98.2 F (36.8 C) (Temporal)   BP Readings from Last 3 Encounters:  01/31/24 (P) 132/87  01/25/24 (!) 144/84  12/28/23 112/78   Pulse Readings from Last 3 Encounters:  01/31/24 (P) 72  01/25/24 93  12/28/23 75    /10  In general this is a well appearing Caucasian man in no acute distress. He's alert and oriented x4 and appropriate throughout the examination. Cardiopulmonary assessment is negative for acute distress and he exhibits normal effort.     KPS = 100  100 - Normal; no complaints; no evidence of disease. 90   - Able to carry on normal activity; minor signs or symptoms of disease. 80   - Normal activity with effort; some signs or symptoms of disease. 31   - Cares for self; unable to carry on normal activity or to do active work. 60   - Requires occasional assistance, but is able to care for most of his personal needs. 50   - Requires considerable assistance and frequent medical care. 40   - Disabled; requires special care and assistance. 30   - Severely disabled; hospital admission is indicated although death not imminent. 20   - Very sick; hospital admission necessary; active supportive treatment necessary. 10   - Moribund; fatal processes progressing rapidly. 0     - Dead  Karnofsky DA, Abelmann WH, Craver LS and Burchenal JH [phone]) The use of the nitrogen mustards in the palliative treatment of carcinoma: with particular reference to bronchogenic carcinoma Cancer 1 634-56  LABORATORY DATA:  Lab Results  Component Value Date   WBC 4.9 05/24/2023   HGB 17.1 05/24/2023   HCT 52.2 (H) 05/24/2023   MCV 90 05/24/2023   PLT 194 05/24/2023   Lab Results  Component Value Date   NA 138 01/25/2024   K 4.7 01/25/2024   CL 103 01/25/2024   CO2 20 01/25/2024   Lab Results  Component Value Date   ALT 20 01/25/2024   AST 23 01/25/2024   ALKPHOS 91 01/25/2024   BILITOT 0.7 01/25/2024     RADIOGRAPHY: CT HIP RIGHT WO CONTRAST Result Date: 01/13/2024 EXAM:  CT OF THE RIGHT HIP WITHOUT IV CONTRAST 01/12/2024 12:18:36 PM TECHNIQUE: CT of the right hip was performed without the administration of intravenous contrast. Multiplanar reformatted images are  provided for review. Automated exposure control, iterative reconstruction, and/or weight based adjustment of the mA/kV was utilized to reduce the radiation dose to as low as reasonably achievable. COMPARISON: Right hip radiographs dated 12/28/2023. CLINICAL HISTORY: Acute-on-chronic right hip pain. FINDINGS: BONES: No acute fracture or dislocation. No aggressive appearing osseous lesion. Mild-to-moderate joint space narrowing of the right hip with subchondral cystic changes at lateral acetabulum. No sizable hip joint effusion. Mild degenerative arthropathy of the visualized right sacroiliac joint with partial ankylosis anteriorly. The pubic symphysis is anatomically aligned. Moderate-to-advanced degenerative changes of the visualized lower lumbar spine with bridging endplate anterior osteophytosis. Redemonstrated chronic pars defects at L5 with approximately 12 mm of anterolisthesis of L5 on S1. SOFT TISSUE: No significant soft tissue edema or fluid collections. Small fat-containing right inguinal hernia. No enlarged lymph nodes identified in the field of view. INTRAPELVIC CONTENTS: Brachytherapy seeds in the prostate. Aortoiliac atherosclerotic calcification. IMPRESSION: 1. No acute osseous abnormality. 2. Mild-to-moderate right hip osteoarthritis. 3. Mild degenerative arthropathy of the right sacroiliac joint with partial anterior ankylosis. Electronically signed by: Harrietta Sherry MD 01/13/2024 05:53 PM EDT RP Workstation: [id number]   NM PET (PSMA) SKULL TO MID THIGH Result Date: 01/12/2024 EXAM: PET AND CT SKULL BASE TO MID THIGH 01/11/2024 03:54:49 PM TECHNIQUE: RADIOPHARMACEUTICAL: 8.50 mCi F-18 POSLUMA Uptake time 60 minutes. PET imaging was acquired from the base of the skull to the mid thighs. Non-contrast  enhanced computed tomography was obtained for attenuation correction and anatomic localization. COMPARISON: Abdominal pelvic CT dated 12/25/2020. CTA chest dated 11/23/2016 also reviewed. CLINICAL HISTORY: WOV Verification; 8.50 mCi F18-POSLUMA @ 1436 IV in RAC//BWM-GT; PSA: 2.2 on 12/27/2023; Per patient: biopsy 6 months ago, no recent surgeries, radioactive seed implantation 04/30/2020. FINDINGS: HEAD AND NECK: No metabolically active cervical lymphadenopathy. A right sided thyroid  nodule measures 1.9 cm on image 52 / 4 and demonstrates tracer affinity along its medial portion at SUV 3.2. This is new since the remote chest CT of 11/23/2016. CHEST: No metabolically active pulmonary nodules or tracer affinity within the pulmonary parenchyma. No metabolically active lymphadenopathy. Aortic atherosclerosis. Pacer / ICD. Mild cardiomegaly. Pulmonary artery enlargement, including a 3.1 cm right pulmonary artery. ABDOMEN AND PELVIS: No abdominopelvic nodal tracer affinity. Right hemidiaphragm elevation. Normal adrenal glands. Scattered colonic diverticula. Small bilateral bladder diverticula. Radiation seeds in the prostate. At the junction of the right seminal vesicle and posterolateral prostatic base is an approximately 1 cm focus of tracer affinity at SUV 5.1. More diffusely, within the central apical gland is activity at SUV 4.1. No evidence of tracer avid metastatic disease. Physiologic activity within the gastrointestinal and genitourinary systems. BONES AND SOFT TISSUE: No abnormal FDG activity localizes to the bones. No metabolically active aggressive osseous lesion. Small, left larger than right fat containing inguinal hernias. IMPRESSION: 1. Multifocal prostatic tracer affinity in the setting of radiation seeds, suspicious for residual viable disease. No evidence of tracer-avid metastatic disease. 2. New right-sided thyroid  nodule with tracer affinity (SUV 3.2). Per consensual criteria, this warrants further  evaluation with thyroid  ultrasound and consideration for sampling. 3. Pulmonary artery enlargement suggestive of pulmonary arterial hypertension. Electronically signed by: Rockey Kilts MD 01/12/2024 04:43 PM EDT RP Workstation: [id number]   CUP PACEART REMOTE DEVICE CHECK Result Date: 01/06/2024 Scheduled remote reviewed. Normal device function.  Presenting rhythm: 1 NSVT, 4 sec, no EGM Known permanent AFL, good V-rate control, Eliquis  per EPIC EMR, programmed VVIR Abnormal HF diagnostics this monitoring period Next remote transmission per protocol. ML, CVRS  IMPRESSION/PLAN: 1. 78 y.o. man with biochemically recurrent prostate cancer with PSA of 2.2 s/p brachytherapy in 2022 for treatment of Gleason 4+3 adenocarcinoma of th prostate.  Today, we talked to the patient about the findings and workup thus far. We discussed the natural history of prostate cancer and general treatment, highlighting the role of radiotherapy in the management of locally recurrent disease. We discussed the available radiation techniques, and focused on the details and logistics of delivery. We recommend proceeding with biopsy of the right seminal vesicle to confirm this is the source of the rising PSA since his prior standard prostate biopsy was negative for active disease. The patient appears to have a good understanding of his disease and our recommendations and was encouraged to ask questions that were answered to his stated satisfaction.  At the end of our discussion, the patient would like to proceed with biopsy of the right seminal vesicle for tissue confirmation.  Pending this biopsy confirms a local recurrence, we would offer a 5 fraction course of stereotactic body radiotherapy (SBRT) to the right seminal vesicle.  We will share our discussion with Dr. Devere and help to facilitate a biopsy as quickly as possible.  We will plan to connect with the patient following biopsy to confirm treatment recommendations and proceed  with treatment planning accordingly at that time.  We enjoyed meeting with him again today and look forward to continuing to participate in his care.  I personally spent 70 minutes in this encounter including chart review, reviewing radiological studies, meeting face-to-face with the patient, entering orders and completing documentation.    Sabra MICAEL Rusk, PA-C    Donnice Barge, MD  [facility]  Radiation Oncology Direct Dial: [phone]  Fax: [phone] [email or web address]  Skype  LinkedIn   This document serves as a record of services personally performed by Donnice Barge, MD and Sabra Rusk, PA-C. It was created on their behalf by Izetta Neither, a trained medical scribe. The creation of this record is based on the scribe's personal observations and the provider's statements to them. This document has been checked and approved by the attending provider.

## 2024-01-31 NOTE — Progress Notes (Signed)
 TO BE COMPLETED BY RADIATION ONCOLOGIST OFFICE:   Patient Name: Robert Ochoa   Date of Birth: [birth year]   Radiation Oncologist: Dr. Donnice Barge   Site to be Treated: Prostate   Will x-rays >10 MV be used? No   Will the radiation be >10 cm from the device? Yes   Planned Treatment Start Date: 1 week   TO BE COMPLETED BY CARDIOLOGIST OFFICE:   Device Information:  Pacemaker []      ICD [x]    Brand: St. Jude/Abbott: [phone] Model #: 2411-36C Ellipse DR  Serial Number: [id number]     Date of Placement: 03/28/2019  Site of Placement: left chest  Remote Device Check--Frequency: 91 days   Last Check: 01/24/24  Is the Patient Pacer Dependent?:  Yes []   No [x]   Does cardiologist request Radiation Oncology to schedule device testing by vendor for the following:  Prior to the Initiation of Treatments?  Yes []  No [x]  During Treatments?  Yes []  No [x]  Post Radiation Treatments?  Yes []  No []   Is device monitoring necessary by vendor/cardiologist team during treatments?  Yes []   No [x]   Is cardiac monitoring by Radiation Oncology nursing necessary during treatments? Yes []   No [x]   Do you recommend device be relocated prior to Radiation Treatment? Yes []   No [x]   **PLEASE LIST ANY NOTES OR SPECIAL REQUESTS:       CARDIOLOGIST SIGNATURE:  Dr. Soyla Norton Per Device Clinic Standing Orders, Prentice JINNY Silvan  01/31/2024 5:02 PM  **Please route completed form back to Radiation Oncology Nursing and P [facility] RAD ONC ADMIN, OR send an update if there will be a delay in having form completed by expected start date.  **Call [phone] if you have any questions or do not get an in-basket response from a Radiation Oncology staff member

## 2024-02-01 ENCOUNTER — Other Ambulatory Visit

## 2024-02-01 DIAGNOSIS — F411 Generalized anxiety disorder: Secondary | ICD-10-CM | POA: Diagnosis not present

## 2024-02-02 ENCOUNTER — Ambulatory Visit
Admission: RE | Admit: 2024-02-02 | Discharge: 2024-02-02 | Disposition: A | Discharge status: Home / Self Care | Source: Ambulatory Visit | Attending: Family Medicine | Admitting: Family Medicine

## 2024-02-02 DIAGNOSIS — M533 Sacrococcygeal disorders, not elsewhere classified: Secondary | ICD-10-CM

## 2024-02-02 DIAGNOSIS — M461 Sacroiliitis, not elsewhere classified: Secondary | ICD-10-CM | POA: Diagnosis not present

## 2024-02-02 DIAGNOSIS — G8929 Other chronic pain: Secondary | ICD-10-CM

## 2024-02-02 MED ORDER — IOPAMIDOL (ISOVUE-M 200) INJECTION 41%
1.0000 mL | Freq: Once | INTRAMUSCULAR | Status: AC
  Administered 2024-02-02: 1 mL via INTRA_ARTICULAR

## 2024-02-02 MED ORDER — METHYLPREDNISOLONE ACETATE 40 MG/ML INJ SUSP (RADIOLOG
80.0000 mg | Freq: Once | INTRAMUSCULAR | Status: AC
  Administered 2024-02-02: 80 mg via INTRA_ARTICULAR

## 2024-02-06 ENCOUNTER — Emergency Department (HOSPITAL_COMMUNITY)
Admission: EM | Admit: 2024-02-06 | Discharge: 2024-02-06 | Disposition: A | Discharge status: Home / Self Care | Attending: Emergency Medicine | Admitting: Emergency Medicine

## 2024-02-06 ENCOUNTER — Emergency Department (HOSPITAL_COMMUNITY)

## 2024-02-06 ENCOUNTER — Encounter (HOSPITAL_COMMUNITY): Payer: Self-pay

## 2024-02-06 ENCOUNTER — Other Ambulatory Visit: Payer: Self-pay

## 2024-02-06 ENCOUNTER — Ambulatory Visit (HOSPITAL_COMMUNITY): Admission: EM | Admit: 2024-02-06 | Discharge: 2024-02-06 | Disposition: A | Discharge status: Home / Self Care

## 2024-02-06 DIAGNOSIS — I482 Chronic atrial fibrillation, unspecified: Secondary | ICD-10-CM | POA: Insufficient documentation

## 2024-02-06 DIAGNOSIS — M79652 Pain in left thigh: Secondary | ICD-10-CM

## 2024-02-06 DIAGNOSIS — R2681 Unsteadiness on feet: Secondary | ICD-10-CM | POA: Diagnosis not present

## 2024-02-06 DIAGNOSIS — M25552 Pain in left hip: Secondary | ICD-10-CM | POA: Insufficient documentation

## 2024-02-06 DIAGNOSIS — Z8546 Personal history of malignant neoplasm of prostate: Secondary | ICD-10-CM | POA: Diagnosis not present

## 2024-02-06 DIAGNOSIS — M79651 Pain in right thigh: Secondary | ICD-10-CM | POA: Diagnosis not present

## 2024-02-06 DIAGNOSIS — M545 Low back pain, unspecified: Secondary | ICD-10-CM

## 2024-02-06 DIAGNOSIS — Z7901 Long term (current) use of anticoagulants: Secondary | ICD-10-CM | POA: Diagnosis not present

## 2024-02-06 DIAGNOSIS — M47816 Spondylosis without myelopathy or radiculopathy, lumbar region: Secondary | ICD-10-CM | POA: Diagnosis not present

## 2024-02-06 MED ORDER — LIDOCAINE 5 % EX OINT: 1.0000 | TOPICAL_OINTMENT | Freq: Four times a day (QID) | CUTANEOUS | 0 refills | Status: AC | PRN

## 2024-02-06 MED ORDER — LIDOCAINE 5 % EX PTCH
1.0000 | MEDICATED_PATCH | CUTANEOUS | Status: DC
  Administered 2024-02-06: 1 via TRANSDERMAL
  Filled 2024-02-06: qty 1

## 2024-02-06 MED ORDER — ACETAMINOPHEN 325 MG PO TABS
650.0000 mg | ORAL_TABLET | Freq: Once | ORAL | Status: AC
  Administered 2024-02-06: 650 mg via ORAL
  Filled 2024-02-06: qty 2

## 2024-02-06 NOTE — ED Provider Notes (Signed)
 [facility]    CSN: [id number] Arrival date & time: 02/06/24  1128      History   Chief Complaint Chief Complaint  Patient presents with   Hip Pain    HPI Robert Ochoa is a 78 y.o. male.   78 year old male who presents urgent care with complaints of significantly worsening bilateral leg pain and difficulty standing and walking.  He reports that he has been having significant problems with his back, hips and legs and has been seeing an orthopedist for this.  He has had a CT of his right hip done recently that showed some degenerative changes as well as some SI joint issues.  They felt that this may be causing some of his symptoms as the right leg had been much worse in the last several months.  He had an SI joint injection done on Wednesday on the right.  He reports that that day he began to have significant pain in both thighs and back.  He was not sure if the left leg was bothering him any before this but had related that in the past his left leg has bothered him.  This has progressively worsened over the next 3 to 4 days including feeling like he cannot stand for any length of time or walk.  He feels like his legs are going to give out on him.  He denies any bowel or bladder incontinence.  He does have a history of prostate cancer and is getting scheduled for biopsy on an area of concern that they found recently.  He denies any fevers or chills.  He denies any numbness or tingling in the legs.   Hip Pain Pertinent negatives include no chest pain, no abdominal pain and no shortness of breath.    Past Medical History:  Diagnosis Date   Arrhythmogenic right ventricular dysplas [facility])    Arrhythmogenic right ventricular dysplasia (HCC)    Hx of VT s/p dual-chamber [employer] ICD. Cardiac MRI 2010 showed moderately dilated RV with hypokinesis, LV EF 47%. EF 50-55% 2010 but down to 45% 05/2011. Cath 01/2009 without significant CAD.   Atrial fibrillation or flutter    Paroxysmal.  Started on Pradaxa  05/2011.   Cardiac defibrillator in place    Elevated PSA    LV dysfunction    per echo 10/2011; EF 35%  // Echo 2/18: EF 40-45, diff HK, Gr 1 DD, mild dilated aortic root, mod LAE, mod RVE, mild reduced RVSF, mod RAE   Prostate cancer (HCC)    Septic shock(785.52) 10-27-11 cause unknown   with associated respiratory/renal failure/PAF/bradycardia   Ventricular tachycardia (HCC)    ICD discharges 05/2011 for this & AF-RVR - started on amiodarone  then.   Wears glasses     Patient Active Problem List   Diagnosis Date Noted   Biochemically recurrent malignant neoplasm of prostate (HCC) 01/31/2024   Hip flexor tendinitis, right 12/28/2023   Spondylolisthesis at L5-S1 level 05/19/2023   Hardening of the aorta (main artery of the heart) 11/26/2022   Atypical atrial flutter (HCC) 07/22/2022   Sinus node dysfunction (HCC) 12/25/2021   Sinus bradycardia 12/25/2021   Hypercoagulable state due to paroxysmal atrial fibrillation (HCC) 07/31/2020   Diverticulitis 07/16/2020   Hearing loss 07/16/2020   Hyperlipidemia 07/16/2020   Long term (current) use of anticoagulants 07/16/2020   Nonischemic congestive cardiomyopathy (HCC) 07/16/2020   Pulmonary hypertension (HCC) 07/16/2020   Squamous cell carcinoma in situ 07/16/2020   Systolic heart failure (HCC) 07/16/2020  Malignant neoplasm of prostate (HCC) 01/08/2020   Abnormal PFT 07/28/2016   Erectile disorder due to medical condition in male patient 04/10/2015   Implantable cardioverter-defibrillator mechanical complication 02/28/2013   Automatic implantable cardioverter-defibrillator in situ 12/14/2011   LV dysfunction 11/09/2011   Symptomatic bradycardia 10/27/2011   Right ventricular dysplasia 09/22/2010   Atrial fibrillation (HCC) 03/25/2009   VENTRICULAR TACHYCARDIA 03/21/2009   SYNCOPE 03/21/2009   SKIN CANCER, HX OF 03/21/2009    Past Surgical History:  Procedure Laterality Date   APPENDECTOMY  2002   ATRIAL  FIBRILLATION ABLATION N/A 02/04/2022   Procedure: ATRIAL FIBRILLATION ABLATION;  Surgeon: [name], MD;  [facility]: [facility];  Service: Cardiovascular;  Laterality: N/A;   CARDIAC DEFIBRILLATOR PLACEMENT  02/19/09    St,. Jude    CARDIOVERSION N/A 05/28/2022   Procedure: CARDIOVERSION;  Surgeon: [name], MD;  Location: [facility] ENDOSCOPY;  Service: Cardiovascular;  Laterality: N/A;   ICD GENERATOR CHANGEOUT N/A 03/27/2021   Procedure: ICD GENERATOR CHANGEOUT;  Surgeon: [name], MD;  Location: [facility];  Service: Cardiovascular;  Laterality: N/A;   IMPLANTABLE CARDIOVERTER DEFIBRILLATOR (ICD) GENERATOR CHANGE N/A 03/01/2013   Procedure: ICD GENERATOR CHANGE;  Surgeon: Elspeth BROCKS Sage, MD;  Location: [facility] CATH LAB;  Service: Cardiovascular;  Laterality: N/A;   IMPLANTABLE CARDIOVERTER DEFIBRILLATOR GENERATOR CHANGE  03/01/13   for premature battery depletetion.  Now has a SJM Ellipse DR device placed by Dr Sage   PROSTATE BIOPSY  11/2019   RADIOACTIVE SEED IMPLANT N/A 04/30/2020   Procedure: RADIOACTIVE SEED IMPLANT/BRACHYTHERAPY IMPLANT;  Surgeon: [name], MD;  Location[facility];  Service: Urology;  Laterality: N/A;   SPACE OAR INSTILLATION N/A 04/30/2020   Procedure: SPACE OAR INSTILLATION;  Surgeon: [name], MD;  Location: [facility];  Service: Urology;  Laterality: N/A;       Home Medications    Prior to Admission medications   Medication Sig Start Date End Date Taking? Authorizing Provider  amiodarone  (PACERONE ) 100 MG tablet TAKE 1 TABLET(100 MG) BY MOUTH DAILY 10/04/23  Yes Sage Elspeth BROCKS, MD  Calcium-Magnesium -Zinc (CAL-MAG-ZINC PO) Take 1 tablet by mouth daily.   Yes [provider]  Carboxymethylcellul-Glycerin (LUBRICATING EYE DROPS OP) Place 1 drop into both eyes daily as needed (dry eyes).   Yes [provider]  ELIQUIS  5 MG TABS tablet TAKE 1 TABLET BY MOUTH  TWICE DAILY 01/14/24  Yes Camnitz, Will Gladis, MD  furosemide  (LASIX ) 20 MG tablet TAKE 1 TABLET BY MOUTH DAILY AS NEEDED FOR SWELLING 03/22/23  Yes Mealor, Augustus E, MD  gabapentin (NEURONTIN) 100 MG capsule Take 2 capsules (200 mg total) by mouth at bedtime. 01/10/24  Yes Claudene Hussar M, DO  lisinopril  (ZESTRIL ) 10 MG tablet TAKE 2 TABLETS BY MOUTH EVERY DAY 09/24/23  Yes Sage Elspeth BROCKS, MD  metoprolol  succinate (TOPROL -XL) 50 MG 24 hr tablet Take 1 tablet (50 mg total) by mouth daily. Take with or immediately following a meal. 01/24/24 04/23/24 Yes Camnitz, Will Gladis, MD  Omega-3 Fatty Acids (OMEGA 3 PO) Take 1,600 mg by mouth daily.   Yes [provider]  sildenafil  (REVATIO ) 20 MG tablet Take 20-100 mg by mouth daily as needed (ED).   Yes [provider]  tamsulosin (FLOMAX) 0.4 MG CAPS capsule Take 0.4 mg by mouth daily. 05/16/20  Yes [provider]  triamcinolone cream (KENALOG) 0.1 % Apply 1 Application topically 2 (two) times daily as needed (itchy  scalp). Patient taking differently: Apply 1 Application topically as needed (itchy scalp). 04/15/22  Yes [provider]  vitamin C (ASCORBIC ACID) 500 MG tablet Take 1,000 mg by mouth daily.   Yes [provider]    Family History Family History  Problem Relation Age of Onset   Heart attack Father    Allergies Mother    Cancer Paternal Aunt        type unknown   Prostate cancer Cousin    Colon cancer Neg Hx    Pancreatic cancer Neg Hx    Breast cancer Neg Hx     Social History Social History   Tobacco Use   Smoking status: Never   Smokeless tobacco: Never   Tobacco comments:    Never smoke 03/04/22  Vaping Use   Vaping status: Never Used  Substance Use Topics   Alcohol use: Not Currently    Alcohol/week: 1.0 standard drink of alcohol    Types: 1 Cans of beer per week    Comment: monthly   Drug use: No     Allergies   Patient has no known allergies.   Review of  Systems Review of Systems  Constitutional:  Negative for chills and fever.  HENT:  Negative for ear pain and sore throat.   Eyes:  Negative for pain and visual disturbance.  Respiratory:  Negative for cough and shortness of breath.   Cardiovascular:  Negative for chest pain and palpitations.  Gastrointestinal:  Negative for abdominal pain and vomiting.  Genitourinary:  Negative for dysuria and hematuria.  Musculoskeletal:  Positive for back pain and gait problem. Negative for arthralgias.       Bilateral thigh pain  Skin:  Negative for color change and rash.  Neurological:  Negative for seizures and syncope.  All other systems reviewed and are negative.    Physical Exam Triage Vital Signs ED Triage Vitals  Encounter Vitals Group     BP 02/06/24 1247 (!) 125/90     Girls Systolic BP Percentile --      Girls Diastolic BP Percentile --      Boys Systolic BP Percentile --      Boys Diastolic BP Percentile --      Pulse Rate 02/06/24 1247 76     Resp 02/06/24 1247 16     Temp 02/06/24 1247 98.4 F (36.9 C)     Temp Source 02/06/24 1247 Oral     SpO2 02/06/24 1250 95 %     Weight 02/06/24 1246 212 lb (96.2 kg)     Height 02/06/24 1246 6' 2 (1.88 m)     Head Circumference --      Peak Flow --      Pain Score 02/06/24 1246 9     Pain Loc --      Pain Education --      Exclude from Growth Chart --    No data found.  Updated Vital Signs BP (!) 125/90 (BP Location: Left Arm)   Pulse 76   Temp 98.4 F (36.9 C) (Oral)   Resp 16   Ht 6' 2 (1.88 m)   Wt 212 lb (96.2 kg)   SpO2 95%   BMI 27.22 kg/m   Visual Acuity Right Eye Distance:   Left Eye Distance:   Bilateral Distance:    Right Eye Near:   Left Eye Near:    Bilateral Near:     Physical Exam Vitals and nursing note reviewed.  Constitutional:  General: He is not in acute distress.    Appearance: He is well-developed.  HENT:     Head: Normocephalic and atraumatic.  Eyes:     Conjunctiva/sclera:  Conjunctivae normal.  Cardiovascular:     Rate and Rhythm: Normal rate and regular rhythm.     Heart sounds: No murmur heard. Pulmonary:     Effort: Pulmonary effort is normal. No respiratory distress.  Abdominal:     Tenderness: There is no abdominal tenderness.  Musculoskeletal:        General: No swelling.     Lumbar back: Spasms and tenderness present. Decreased range of motion. Negative right straight leg raise test and negative left straight leg raise test.       Back:     Comments: Lower extremities are warm and well-perfused with only a trace amount of edema.  There is no deformity to the back.  When the patient stands for evaluation he is only able to stand for about 20 seconds before he feels like he needs to sit down as his legs are painful and may give out  Skin:    General: Skin is warm and dry.     Capillary Refill: Capillary refill takes less than 2 seconds.  Neurological:     General: No focal deficit present.     Mental Status: He is alert and oriented to person, place, and time.     Cranial Nerves: No cranial nerve deficit.  Psychiatric:        Mood and Affect: Mood normal.      UC Treatments / Results  Labs (all labs ordered are listed, but only abnormal results are displayed) Labs Reviewed - No data to display  EKG   Radiology No results found.  Procedures Procedures (including critical care time)  Medications Ordered in UC Medications - No data to display  Initial Impression / Assessment and Plan / UC Course  I have reviewed the triage vital signs and the nursing notes.  Pertinent labs & imaging results that were available during my care of the patient were reviewed by me and considered in my medical decision making (see chart for details).     Bilateral thigh pain  Acute left-sided low back pain without sciatica  Difficulty standing   Long discussion with the patient.  There are multiple things that could be causing his symptoms however  there are some red flags present especially the inability to stand or walk for any length of time without feeling like the legs will give out.  Also the significantly progressively worsening pain is concerning.  The SI joint injection recently may or may not be contributing but interestingly the symptoms did seem to worsen after this.  At this point likely evaluation is going to require more advanced imaging than available here at urgent care and we recommend further evaluation at the emergency room.  Patient is in agreement.  Final Clinical Impressions(s) / UC Diagnoses   Final diagnoses:  Bilateral thigh pain  Acute left-sided low back pain without sciatica  Difficulty standing     Discharge Instructions      Concerning symptoms given the bilateral lower extremity pain with inability to stand or walk for any length of time.  This is a concerning finding especially given the significant worsening over the last 4 days.  We recommend further evaluation in the emergency room where more advanced imaging can be obtained.    ED Prescriptions   None    PDMP not  reviewed this encounter.   Teresa Almarie LABOR, PA-C 02/06/24 1323

## 2024-02-06 NOTE — ED Triage Notes (Signed)
 PT presents from [facility] for bilateral leg pain, primarily when walking.

## 2024-02-06 NOTE — ED Triage Notes (Signed)
 Pt states that he has history of right hip pain. Pt states that he also has a history of bilateral leg pain. X4-5 days  Pt states that he does see an orthopedist.  Pt states that the bilateral pain makes it hard to walk.

## 2024-02-06 NOTE — Discharge Instructions (Addendum)
 As discussed, you will need to follow-up with your orthopedic provider.  You can also follow-up with primary care.  Seek emergency care if experiencing any new or worsening symptoms.

## 2024-02-06 NOTE — ED Provider Notes (Signed)
 [facility] Provider Note   CSN: [id number] Arrival date & time: 02/06/24  1346     Patient presents with: Leg Pain   Robert Ochoa is a 78 y.o. male with PMHx Afib on Eliquis , chronic low back pain, prostate cancer who presents to ED concerned for left hip pain x5 days. Patient with chronic left lower back pain and right hip pain and sees outpatient orthopedic provider for steroid injections. Last steroid injection was 4 days ago. Patient noting that new left hip pain was present prior to steroid injection. Patient taken gabapentin and tylenol  for his chronic pain which was not providing much relief today. Last dose of Tylenol  was earlier this morning. Patient denies recent trauma. Patient is able to walk short distances before the pain becomes too severe and he needs to sit down.  Patient recently with recurring prostate cancer and is working with outpatient provider to come up with treatment plan for this.   Patient denies urinary retention, fecal incontinence, saddle anesthesia, lower extremity weakness, hx of cancer, fever, immunosuppression, IVDU, significant trauma.     Leg Pain Associated symptoms: back pain        Prior to Admission medications   Medication Sig Start Date End Date Taking? Authorizing Provider  lidocaine  (XYLOCAINE ) 5 % ointment Apply 1 Application topically 4 (four) times daily as needed for up to 9 days. 02/06/24 02/15/24 Yes Emeka Lindner, Nidia F, PA-C  amiodarone  (PACERONE ) 100 MG tablet TAKE 1 TABLET(100 MG) BY MOUTH DAILY 10/04/23   Fernande Elspeth BROCKS, MD  Calcium-Magnesium -Zinc (CAL-MAG-ZINC PO) Take 1 tablet by mouth daily.    [provider]  Carboxymethylcellul-Glycerin (LUBRICATING EYE DROPS OP) Place 1 drop into both eyes daily as needed (dry eyes).    [provider]  ELIQUIS  5 MG TABS tablet TAKE 1 TABLET BY MOUTH TWICE DAILY 01/14/24   Camnitz, Soyla Lunger, MD  furosemide  (LASIX ) 20 MG tablet  TAKE 1 TABLET BY MOUTH DAILY AS NEEDED FOR SWELLING 03/22/23   Mealor, Augustus E, MD  gabapentin (NEURONTIN) 100 MG capsule Take 2 capsules (200 mg total) by mouth at bedtime. 01/10/24   Claudene Arthea HERO, DO  lisinopril  (ZESTRIL ) 10 MG tablet TAKE 2 TABLETS BY MOUTH EVERY DAY 09/24/23   Fernande Elspeth BROCKS, MD  metoprolol  succinate (TOPROL -XL) 50 MG 24 hr tablet Take 1 tablet (50 mg total) by mouth daily. Take with or immediately following a meal. 01/24/24 04/23/24  Camnitz, Soyla Lunger, MD  Omega-3 Fatty Acids (OMEGA 3 PO) Take 1,600 mg by mouth daily.    [provider]  sildenafil  (REVATIO ) 20 MG tablet Take 20-100 mg by mouth daily as needed (ED).    [provider]  tamsulosin (FLOMAX) 0.4 MG CAPS capsule Take 0.4 mg by mouth daily. 05/16/20   [provider]  triamcinolone cream (KENALOG) 0.1 % Apply 1 Application topically 2 (two) times daily as needed (itchy scalp). Patient taking differently: Apply 1 Application topically as needed (itchy scalp). 04/15/22   [provider]  vitamin C (ASCORBIC ACID) 500 MG tablet Take 1,000 mg by mouth daily.    [provider]    Allergies: Patient has no known allergies.    Review of Systems  Musculoskeletal:  Positive for back pain.    Updated Vital Signs BP (!) 125/95 (BP Location: Right Arm)   Pulse 78   Temp 98.3 F (36.8 C) (Oral)   Resp 18   Ht 6' 2 (1.88 m)  Wt 96.2 kg   SpO2 100%   BMI 27.22 kg/m   Physical Exam Vitals and nursing note reviewed.  Constitutional:      General: He is not in acute distress.    Appearance: He is not ill-appearing or toxic-appearing.  HENT:     Head: Normocephalic and atraumatic.     Mouth/Throat:     Mouth: Mucous membranes are moist.  Eyes:     General: No scleral icterus.       Right eye: No discharge.        Left eye: No discharge.     Conjunctiva/sclera: Conjunctivae normal.  Cardiovascular:     Rate and Rhythm: Normal rate and regular rhythm.      Pulses: Normal pulses.     Heart sounds: Normal heart sounds. No murmur heard. Pulmonary:     Effort: Pulmonary effort is normal. No respiratory distress.     Breath sounds: Normal breath sounds. No wheezing, rhonchi or rales.  Musculoskeletal:     Right lower leg: No edema.     Left lower leg: No edema.     Comments: No midline tenderness to palpation.  No swelling, erythema, or increased warmth along spine.  There are some tenderness palpation of left lateral gluteal area.  Skin:    General: Skin is warm and dry.     Findings: No rash.  Neurological:     General: No focal deficit present.     Mental Status: He is alert and oriented to person, place, and time. Mental status is at baseline.     Comments: GCS 15. Speech is goal oriented. No deficits appreciated to CN III-XII. Patient has 5/5 strength against resistance in all LE major muscle groups bilaterally. Sensation to light touch intact. No saddle anesthesia. Patient moves extremities without ataxia.    Psychiatric:        Mood and Affect: Mood normal.        Behavior: Behavior normal.     (all labs ordered are listed, but only abnormal results are displayed) Labs Reviewed - No data to display  EKG: None  Radiology: CT Lumbar Spine Wo Contrast Result Date: 02/06/2024 EXAM: CT OF THE LUMBAR SPINE WITHOUT CONTRAST 02/06/2024 04:42:09 PM TECHNIQUE: CT of the lumbar spine was performed without the administration of intravenous contrast. Multiplanar reformatted images are provided for review. Automated exposure control, iterative reconstruction, and/or weight based adjustment of the mA/kV was utilized to reduce the radiation dose to as low as reasonably achievable. COMPARISON: CT myelogram from Aug 04, 2023. CLINICAL HISTORY: Low back pain, cancer suspected FINDINGS: BONES AND ALIGNMENT: Grade 2 anterolisthesis of L5 on S1, unchanged. No evidence of acute fracture. Vertebral body heights are maintained. Chronic L5 pars defects.  Bridging lower lumbar osteophytes are unchanged. No suspicous bone lesion by CT. DEGENERATIVE CHANGES: Similar degenerative changes including right greater than left subarticular recess stenosis at L2-L3 and left foraminal stenosis at L3-L4, L4-L5 and L5-S1 that was better characterized on CT myelogram from Aug 04, 2023. SOFT TISSUES: No acute abnormality. IMPRESSION: 1. No evidence of acute fracture or new malalignment. 2. Chronic L5 pars defects with similar grade 2 anterolisthesis. 3. Similar degenerative changes including right greater than left subarticular recess stenosis at L2-L3 and left foraminal stenosis at L3-L4, L4-L5 and L5-S1 that was better characterized on CT myelogram from Aug 04, 2023. 4. No suspicous bone lesion by CT. MRI with contrast could provide more sensitive evaluation if clinically warranted. Electronically signed by: Gilmore Molt MD  02/06/2024 05:04 PM EST RP Workstation: [id number]   DG Hip Unilat W or Wo Pelvis 2-3 Views Left Result Date: 02/06/2024 EXAM: 2 or 3 VIEW(S) XRAY OF THE LEFT HIP 02/06/2024 04:06:00 PM COMPARISON: None available. CLINICAL HISTORY: hip pain FINDINGS: BONES AND JOINTS: No acute fracture or focal osseous lesion. The hip joint is maintained. No significant degenerative changes. SOFT TISSUES: Brachytherapy seeds are present in the prostate. The remaining visualized soft tissues are unremarkable. IMPRESSION: 1. No acute osseous abnormality identified. Electronically signed by: Norman Gatlin MD 02/06/2024 04:12 PM EST RP Workstation: [id number]     Procedures   Medications Ordered in the ED  lidocaine  (LIDODERM ) 5 % 1 patch (1 patch Transdermal Patch Applied 02/06/24 1547)  acetaminophen  (TYLENOL ) tablet 650 mg (650 mg Oral Given 02/06/24 1548)                                    Medical Decision Making Amount and/or Complexity of Data Reviewed Radiology: ordered.  Risk OTC drugs. Prescription drug management.   This patient presents to the  ED for concern of back pain, this involves an extensive number of treatment options, and is a complaint that carries with it a high risk of complications and morbidity.  The differential diagnosis includes pyelonephritis, nephrolithiasis, spinal abscess, osteomyelitis, herniated disc, muscle strain, spinal fracture, meningitis, cancer, cauda equina syndrome.   Co morbidities that complicate the patient evaluation  Afib on Eliquis , chronic low back pain, prostate cancer   Additional history obtained:  Dr. Treasure PCP   Problem List / ED Course / Critical interventions / Medication management  Patient presents ED concern for left hip pain.  Patient with chronic lower back and right hip pain and follows with outpatient provider for this.  New left hip pain was present prior to steroid injection which was given last Wednesday.  Physical exam reassuring - no concern for infectious process related to recent injection.  Patient afebrile with stable vitals. I ordered imaging studies including CT lumbar spine and left hip x-ray. I independently visualized and interpreted imaging which showed no acute process. I agree with the radiologist interpretation Shared all results with patient.  Answered all questions.  Provided patient with Tylenol  and lidocaine  patch.  Patient stated that his pain is significantly improved. I will prescribe patient topical lidocaine  and have him continue with tylenol  and gabapentin.  Recommended patient follow-up with his orthopedic provider.  Patient agreeable to plan. Staffed with Dr. Francesca.  I have reviewed the patients home medicines and have made adjustments as needed The patient has been appropriately medically screened and/or stabilized in the ED. I have low suspicion for any other emergent medical condition which would require further screening, evaluation or treatment in the ED or require inpatient management. At time of discharge the patient is hemodynamically stable  and in no acute distress. I have discussed work-up results and diagnosis with patient and answered all questions. Patient is agreeable with discharge plan. We discussed strict return precautions for returning to the emergency department and they verbalized understanding.     Social Determinants of Health:  geriatric      Final diagnoses:  Acute hip pain, left    ED Discharge Orders          Ordered    lidocaine  (XYLOCAINE ) 5 % ointment  4 times daily PRN        02/06/24 1739  Hoy Nidia FALCON, [place] 02/06/24 1757    Francesca Elsie CROME, MD 02/09/24 1534

## 2024-02-06 NOTE — Discharge Instructions (Signed)
 Concerning symptoms given the bilateral lower extremity pain with inability to stand or walk for any length of time.  This is a concerning finding especially given the significant worsening over the last 4 days.  We recommend further evaluation in the emergency room where more advanced imaging can be obtained.

## 2024-02-07 ENCOUNTER — Telehealth: Payer: Self-pay | Admitting: Family Medicine

## 2024-02-07 ENCOUNTER — Ambulatory Visit: Admitting: Sports Medicine

## 2024-02-07 VITALS — BP 104/78 | HR 74 | Ht 74.0 in

## 2024-02-07 DIAGNOSIS — M545 Low back pain, unspecified: Secondary | ICD-10-CM

## 2024-02-07 DIAGNOSIS — M5416 Radiculopathy, lumbar region: Secondary | ICD-10-CM | POA: Diagnosis not present

## 2024-02-07 DIAGNOSIS — G8929 Other chronic pain: Secondary | ICD-10-CM | POA: Diagnosis not present

## 2024-02-07 DIAGNOSIS — M5442 Lumbago with sciatica, left side: Secondary | ICD-10-CM

## 2024-02-07 NOTE — Telephone Encounter (Signed)
 Patient called as he spent all day yesterday in the ER. He stated they said he may need a shot in his hip but they would not give him one he states. He did not know why he could not get one there and did not know if they needed to get another CT scan. Patient is scheduled today with Dr. Leonce as he wanted to see someone today. FYI.

## 2024-02-07 NOTE — Patient Instructions (Addendum)
 Epidural L L3/L4 Tylenol  [phone] mg 2-3 times a day for pain relief  Gabapentin nightly See me 2 weeks after injection

## 2024-02-07 NOTE — Progress Notes (Signed)
 Robert Ochoa Robert Ochoa D.[facility] [address] [place] [address] Phone: [phone]   Assessment and Plan:     1. Lumbar radiculopathy (Primary) 2. Lumbar spine pain 3. Chronic left-sided low back pain with left-sided sciatica -Chronic with exacerbation, subsequent visit - Consistent with lumbar radiculopathy causing pain into left lower extremity.  Most likely symptomatic level is L3-L4 based on CT lumbar spine with contrast performed on 08/04/2023 showing abnormal rocking at this level with left posterior lateral disc herniation potentially affecting left L3 and L4 nerves - Recommend left-sided epidural CSI to L3-L4.  Patient will have to stop Eliquis  3 days before epidural which she has done in the past for prior epidurals - Use Tylenol  500 to 1000 mg tablets 2-3 times a day for day-to-day pain relief  -Do not recommend p.o. prednisone or NSAIDs with past medical history of atrial fibrillation on chronic anticoagulation with Eliquis .  Pertinent previous records reviewed include ER note 02/06/2024, CT lumbar spine 08/04/2023, CT lumbar spine 02/06/2024   Follow Up: 2 weeks after epidural to review benefit.  Could consider repeat epidural versus referral to neurosurgery.  Patient was not happy with his prior neurosurgery visit where he states they told him they would call him back and never called him again.  If additional neurosurgery visits is required, he would prefer referral to a different location   Subjective:    Chief Complaint: Low back pain, left leg pain  HPI:   02/07/24 L hip and leg pain for past week. Pain began after getting R SI jt injection. Has had issues with lumbar radiculopathy in R leg. Only able to walk short distances as his leg feels like it is going to collapse.   Relevant Historical Information: Chronic anticoagulation on Eliquis ,  Hx of Afib  Additional pertinent review of systems negative.   Current Outpatient  Medications:    amiodarone  (PACERONE ) 100 MG tablet, TAKE 1 TABLET(100 MG) BY MOUTH DAILY, Disp: 90 tablet, Rfl: 2   Calcium-Magnesium -Zinc (CAL-MAG-ZINC PO), Take 1 tablet by mouth daily., Disp: , Rfl:    Carboxymethylcellul-Glycerin (LUBRICATING EYE DROPS OP), Place 1 drop into both eyes daily as needed (dry eyes)., Disp: , Rfl:    ELIQUIS  5 MG TABS tablet, TAKE 1 TABLET BY MOUTH TWICE DAILY, Disp: 60 tablet, Rfl: 5   furosemide  (LASIX ) 20 MG tablet, TAKE 1 TABLET BY MOUTH DAILY AS NEEDED FOR SWELLING, Disp: 30 tablet, Rfl: 3   gabapentin (NEURONTIN) 100 MG capsule, Take 2 capsules (200 mg total) by mouth at bedtime., Disp: 180 capsule, Rfl: 0   lidocaine  (XYLOCAINE ) 5 % ointment, Apply 1 Application topically 4 (four) times daily as needed for up to 9 days., Disp: 35.44 g, Rfl: 0   lisinopril  (ZESTRIL ) 10 MG tablet, TAKE 2 TABLETS BY MOUTH EVERY DAY, Disp: 180 tablet, Rfl: 1   metoprolol  succinate (TOPROL -XL) 50 MG 24 hr tablet, Take 1 tablet (50 mg total) by mouth daily. Take with or immediately following a meal., Disp: 90 tablet, Rfl: 3   Omega-3 Fatty Acids (OMEGA 3 PO), Take 1,600 mg by mouth daily., Disp: , Rfl:    sildenafil  (REVATIO ) 20 MG tablet, Take 20-100 mg by mouth daily as needed (ED)., Disp: , Rfl:    tamsulosin (FLOMAX) 0.4 MG CAPS capsule, Take 0.4 mg by mouth daily., Disp: , Rfl:    triamcinolone cream (KENALOG) 0.1 %, Apply 1 Application topically 2 (two) times daily as needed (itchy scalp). (Patient taking differently: Apply  1 Application topically as needed (itchy scalp).), Disp: , Rfl:    vitamin C (ASCORBIC ACID) 500 MG tablet, Take 1,000 mg by mouth daily., Disp: , Rfl:    Objective:     Vitals:   02/07/24 1111  BP: 104/78  Pulse: 74  SpO2: 94%  Height: 6' 2 (1.88 m)      Body mass index is 27.22 kg/m.    Physical Exam:    Gen: Appears well, nad, nontoxic and pleasant Psych: Alert and oriented, appropriate mood and affect Neuro: sensation intact, strength  is 4/5 in left lower extremity compared to 5/5 in the right lower extremity, muscle tone wnl Skin: no susupicious lesions or rashes  Back - Normal skin, Spine with normal alignment and no deformity.   No tenderness to vertebral process palpation.   Paraspinous muscles are not tender and without spasm NTTP gluteal musculature Straight leg raise positive left Gait antalgic.  Currently using wheelchair due to pain   Electronically signed by:  Odis Mace D.[facility] 12:16 PM 02/07/24

## 2024-02-08 ENCOUNTER — Telehealth (HOSPITAL_BASED_OUTPATIENT_CLINIC_OR_DEPARTMENT_OTHER): Payer: Self-pay

## 2024-02-08 ENCOUNTER — Telehealth: Payer: Self-pay | Admitting: Orthopedic Surgery

## 2024-02-08 NOTE — Telephone Encounter (Signed)
   Pre-operative Risk Assessment    Patient Name: Robert Ochoa  DOB: 07/14/1945 MRN: [id number]   Date of last office visit: 05/24/23 with Dr. Fernande Date of next office visit: NA   Request for Surgical Clearance    Procedure:  Lumbar ESI  Date of Surgery:  Clearance TBD                                 Surgeon:  Not indicated Surgeon's Group or Practice Name:  [facility]  Phone number:  [phone] Fax number:  [phone]   Type of Clearance Requested:   - Medical  - Pharmacy:  Hold Apixaban  (Eliquis ) for 4 doses   Type of Anesthesia:  Not Indicated   Additional requests/questions:    Bonney Augustin JONETTA Delores   02/08/2024, 12:24 PM

## 2024-02-09 NOTE — Telephone Encounter (Signed)
Pharmacy please advise on holding Eliquis prior to Lumbar ESI scheduled for TBD. Thank you.

## 2024-02-10 NOTE — Progress Notes (Signed)
 Still pending date for biopsy at [facility].  RN spoke with patient, he is aware that appointment is pending.  Will continue to follow to ensure biopsy is scheduled.

## 2024-02-14 ENCOUNTER — Telehealth (HOSPITAL_BASED_OUTPATIENT_CLINIC_OR_DEPARTMENT_OTHER): Payer: Self-pay | Admitting: *Deleted

## 2024-02-14 NOTE — Telephone Encounter (Signed)
 Patient with diagnosis of atrial fibrillation on Eliquis  for anticoagulation.    Procedure:  Lumbar ESI   Date of Surgery:  Clearance TBD       CHA2DS2-VASc Score = 4   This indicates a 4.8% annual risk of stroke. The patient's score is based upon: CHF History: 1 HTN History: 1 Diabetes History: 0 Stroke History: 0 Vascular Disease History: 0 Age Score: 2 Gender Score: 0    CrCl 78 Platelet count 194  Patient has not had an Afib/aflutter ablation in the last 3 months, DCCV within the last 4 weeks or a watchman implanted in the last 45 days   Per office protocol, patient can hold Eliquis  for 3 days prior to procedure.   Patient will not need bridging with Lovenox (enoxaparin) around procedure.  Our office policy requires 3 day hold for all spinal procedures.   **This guidance is not considered finalized until pre-operative APP has relayed final recommendations.**

## 2024-02-14 NOTE — Telephone Encounter (Signed)
   Name: Robert Ochoa  DOB: January 06, 1946  MRN: [id number]  Primary Cardiologist: None   Preoperative team, please contact this patient and set up a phone call appointment for further preoperative risk assessment. Please obtain consent and complete medication review. Thank you for your help.  I confirm that guidance regarding antiplatelet and oral anticoagulation therapy has been completed and, if necessary, noted below.  Our office policy requires 3 day hold for all spinal procedures.   I also confirmed the patient resides in the state of [place] . As per [facility] Medical Board telemedicine laws, the patient must reside in the state in which the provider is licensed.   Josefa CHRISTELLA Beauvais, NP 02/14/2024, 2:52 PM [facility]

## 2024-02-14 NOTE — Telephone Encounter (Signed)
 Pt has been scheduled tele preop appt 02/21/24. Med rec and consent are done.      Patient Consent for Virtual Visit        Robert Ochoa has provided verbal consent on 02/14/2024 for a virtual visit (video or telephone).   CONSENT FOR VIRTUAL VISIT FOR:  Robert Ochoa  By participating in this virtual visit I agree to the following:  I hereby voluntarily request, consent and authorize [facility] and its employed or contracted physicians, physician assistants, nurse practitioners or other licensed health care professionals (the Practitioner), to provide me with telemedicine health care services (the "Services) as deemed necessary by the treating Practitioner. I acknowledge and consent to receive the Services by the Practitioner via telemedicine. I understand that the telemedicine visit will involve communicating with the Practitioner through live audiovisual communication technology and the disclosure of certain medical information by electronic transmission. I acknowledge that I have been given the opportunity to request an in-person assessment or other available alternative prior to the telemedicine visit and am voluntarily participating in the telemedicine visit.  I understand that I have the right to withhold or withdraw my consent to the use of telemedicine in the course of my care at any time, without affecting my right to future care or treatment, and that the Practitioner or I may terminate the telemedicine visit at any time. I understand that I have the right to inspect all information obtained and/or recorded in the course of the telemedicine visit and may receive copies of available information for a reasonable fee.  I understand that some of the potential risks of receiving the Services via telemedicine include:  Delay or interruption in medical evaluation due to technological equipment failure or disruption; Information transmitted may not be sufficient (e.g. poor  resolution of images) to allow for appropriate medical decision making by the Practitioner; and/or  In rare instances, security protocols could fail, causing a breach of personal health information.  Furthermore, I acknowledge that it is my responsibility to provide information about my medical history, conditions and care that is complete and accurate to the best of my ability. I acknowledge that Practitioner's advice, recommendations, and/or decision may be based on factors not within their control, such as incomplete or inaccurate data provided by me or distortions of diagnostic images or specimens that may result from electronic transmissions. I understand that the practice of medicine is not an exact science and that Practitioner makes no warranties or guarantees regarding treatment outcomes. I acknowledge that a copy of this consent can be made available to me via my patient portal [facility] MyChart), or I can request a printed copy by calling the office of [facility].    I understand that my insurance will be billed for this visit.   I have read or had this consent read to me. I understand the contents of this consent, which adequately explains the benefits and risks of the Services being provided via telemedicine.  I have been provided ample opportunity to ask questions regarding this consent and the Services and have had my questions answered to my satisfaction. I give my informed consent for the services to be provided through the use of telemedicine in my medical care

## 2024-02-14 NOTE — Telephone Encounter (Signed)
 Pt has been scheduled tele preop appt 02/21/24. Med rec and consent are done.

## 2024-02-15 ENCOUNTER — Telehealth (HOSPITAL_BASED_OUTPATIENT_CLINIC_OR_DEPARTMENT_OTHER): Payer: Self-pay | Admitting: *Deleted

## 2024-02-15 DIAGNOSIS — F411 Generalized anxiety disorder: Secondary | ICD-10-CM | POA: Diagnosis not present

## 2024-02-15 NOTE — Telephone Encounter (Signed)
   Pre-operative Risk Assessment    Patient Name: Robert Ochoa  DOB: 07-08-1945 MRN: [id number]   Date of last office visit: 05/24/23 DR. KLEIN Date of next office visit: 02/21/24 TELE PREOP APPT    Request for Surgical Clearance    Procedure:  PROSTATE Bx   Date of Surgery:  Clearance TBD                                Surgeon:  DR. LONNI HAN Surgeon's Group or Practice Name:  [facility] Phone number:  [phone] Fax number:  [phone]   Type of Clearance Requested:   - Medical  - Pharmacy:  Hold Apixaban  (Eliquis ) x 3 DAYS PRIOR   Type of Anesthesia:  Not Indicated   Additional requests/questions:    Signed, Kourtney Montesinos   02/15/2024, 12:09 PM

## 2024-02-15 NOTE — Progress Notes (Signed)
 Patient is now scheduled for biopsy on 02/28/2024 with Dr. Devere.

## 2024-02-21 ENCOUNTER — Ambulatory Visit: Attending: Cardiology | Admitting: Cardiology

## 2024-02-21 DIAGNOSIS — Z01810 Encounter for preprocedural cardiovascular examination: Secondary | ICD-10-CM

## 2024-02-21 DIAGNOSIS — Z01818 Encounter for other preprocedural examination: Secondary | ICD-10-CM

## 2024-02-21 NOTE — Telephone Encounter (Signed)
 Patient with diagnosis of A Fib on Eliquis  for anticoagulation.    Procedure: prostate biopsy Date of procedure: TBD   CHA2DS2-VASc Score = 4  This indicates a 4.8% annual risk of stroke. The patient's score is based upon: CHF History: 1 HTN History: 1 Diabetes History: 0 Stroke History: 0 Vascular Disease History: 0 Age Score: 2 Gender Score: 0     CrCl 78 ml/min Platelet count 194K  Patient has not had an Afib/aflutter ablation in the last 3 months, DCCV within the last 4 weeks or a watchman implanted in the last 45 days    Per office protocol, patient can hold Eliquis  for 3 days prior to procedure.  **This guidance is not considered finalized until pre-operative APP has relayed final recommendations.**

## 2024-02-21 NOTE — Progress Notes (Signed)
 Virtual Visit via Telephone Note   Because of [name] co-morbid illnesses, he is at least at moderate risk for complications without adequate follow up.  This format is felt to be most appropriate for this patient at this time.  Due to technical limitations with video connection (technology), today's appointment will be conducted as an audio only telehealth visit, and Robert Ochoa verbally agreed to proceed in this manner.   All issues noted in this document were discussed and addressed.  No physical exam could be performed with this format.  Evaluation Performed:  Preoperative cardiovascular risk assessment _____________   Date:  02/21/2024   Patient ID:  Robert Ochoa, DOB 1945/07/18, MRN [id number] Patient Location:  Home Provider location:   Office  Primary Care Provider:  [name], MD Primary Cardiologist:  None  Chief Complaint / Patient Profile   78 y.o. y/o male with a h/o permanent atrial fibrillation on amiodarone  and Eliquis , arrhythmia genic right ventricular cardiomyopathy, history of VT on amiodarone , who is pending prostate biopsy and lumbar ESI and presents today for telephonic preoperative cardiovascular risk assessment.  History of Present Illness    Robert Ochoa is a 78 y.o. male who presents via audio conferencing for a telehealth visit today.  Pt was last seen in cardiology clinic on 01/25/2024 by Quita Kicks PA.  At that time Robert Ochoa was doing okay from a cardiac perspective, there had been some tachyarrhythmias noted on his ICD and his Toprol  have previously been adjusted, at the time of the visit he was doing well feeling good since his medication titration.  The patient is now pending procedure as outlined above. Since his last visit, he has been doing well from a cardiac perspective, but his back pain has been prohibiting a lot of his physical activity to the degree he would like to be, but he is active. He denies chest pain, palpitations,  dyspnea, pnd, orthopnea, n, v, dizziness, syncope, edema, weight gain, or early satiety.     Past Medical History    Past Medical History:  Diagnosis Date   Arrhythmogenic right ventricular dysplas (HCC)    Arrhythmogenic right ventricular dysplasia (HCC)    Hx of VT s/p dual-chamber [employer]. Jude ICD. Cardiac MRI 2010 showed moderately dilated RV with hypokinesis, LV EF 47%. EF 50-55% 2010 but down to 45% 05/2011. Cath 01/2009 without significant CAD.   Atrial fibrillation or flutter    Paroxysmal. Started on Pradaxa  05/2011.   Cardiac defibrillator in place    Elevated PSA    LV dysfunction    per echo 10/2011; EF 35%  // Echo 2/18: EF 40-45, diff HK, Gr 1 DD, mild dilated aortic root, mod LAE, mod RVE, mild reduced RVSF, mod RAE   Prostate cancer (HCC)    Septic shock(785.52) 10-27-11 cause unknown   with associated respiratory/renal failure/PAF/bradycardia   Ventricular tachycardia (HCC)    ICD discharges 05/2011 for this & AF-RVR - started on amiodarone  then.   Wears glasses    Past Surgical History:  Procedure Laterality Date   APPENDECTOMY  2002   ATRIAL FIBRILLATION ABLATION N/A 02/04/2022   Procedure: ATRIAL FIBRILLATION ABLATION;  Surgeon: [name], MD;  Location: [facility] INVASIVE CV LAB;  Service: Cardiovascular;  Laterality: N/A;   CARDIAC DEFIBRILLATOR PLACEMENT  02/19/09    St,. Jude    CARDIOVERSION N/A 05/28/2022   Procedure: CARDIOVERSION;  Surgeon: [name], MD;  Location: [facility] ENDOSCOPY;  Service: Cardiovascular;  Laterality: N/A;   ICD GENERATOR  CHANGEOUT N/A 03/27/2021   Procedure: ICD GENERATOR CHANGEOUT;  Surgeon: [name], MD;  Location[facility] INVASIVE CV LAB;  Service: Cardiovascular;  Laterality: N/A;   IMPLANTABLE CARDIOVERTER DEFIBRILLATOR (ICD) GENERATOR CHANGE N/A 03/01/2013   Procedure: ICD GENERATOR CHANGE;  Surgeon: Elspeth JAYSON Sage, MD;  Location: [facility] CATH LAB;  Service: Cardiovascular;  Laterality: N/A;   IMPLANTABLE CARDIOVERTER DEFIBRILLATOR  GENERATOR CHANGE  03/01/13   for premature battery depletetion.  Now has a SJM Ellipse DR device placed by Dr Sage   PROSTATE BIOPSY  11/2019   RADIOACTIVE SEED IMPLANT N/A 04/30/2020   Procedure: RADIOACTIVE SEED IMPLANT/BRACHYTHERAPY IMPLANT;  Surgeon: [name], MD;  Location: [facility];  Service: Urology;  Laterality: N/A;   SPACE OAR INSTILLATION N/A 04/30/2020   Procedure: SPACE OAR INSTILLATION;  Surgeon: [name], MD;  Location: [facility];  Service: Urology;  Laterality: N/A;    Allergies  No Known Allergies  Home Medications    Prior to Admission medications   Medication Sig Start Date End Date Taking? Authorizing Provider  amiodarone  (PACERONE ) 100 MG tablet TAKE 1 TABLET(100 MG) BY MOUTH DAILY 10/04/23   Sage Elspeth JAYSON, MD  Calcium-Magnesium -Zinc (CAL-MAG-ZINC PO) Take 1 tablet by mouth daily.    [provider]  Carboxymethylcellul-Glycerin (LUBRICATING EYE DROPS OP) Place 1 drop into both eyes daily as needed (dry eyes).    [provider]  ELIQUIS  5 MG TABS tablet TAKE 1 TABLET BY MOUTH TWICE DAILY 01/14/24   Camnitz, Soyla Lunger, MD  furosemide  (LASIX ) 20 MG tablet TAKE 1 TABLET BY MOUTH DAILY AS NEEDED FOR SWELLING 03/22/23   Mealor, Augustus E, MD  gabapentin  (NEURONTIN ) 100 MG capsule Take 2 capsules (200 mg total) by mouth at bedtime. 01/10/24   Claudene Arthea HERO, DO  lisinopril  (ZESTRIL ) 10 MG tablet TAKE 2 TABLETS BY MOUTH EVERY DAY 09/24/23   Sage Elspeth JAYSON, MD  metoprolol  succinate (TOPROL -XL) 50 MG 24 hr tablet Take 1 tablet (50 mg total) by mouth daily. Take with or immediately following a meal. 01/24/24 04/23/24  Camnitz, Soyla Lunger, MD  Omega-3 Fatty Acids (OMEGA 3 PO) Take 1,600 mg by mouth daily.    [provider]  sildenafil  (REVATIO ) 20 MG tablet Take 20-100 mg by mouth daily as needed (ED).    [provider]  tamsulosin (FLOMAX) 0.4 MG CAPS capsule Take 0.4 mg  by mouth daily. 05/16/20   [provider]  triamcinolone cream (KENALOG) 0.1 % Apply 1 Application topically 2 (two) times daily as needed (itchy scalp). Patient taking differently: Apply 1 Application topically as needed (itchy scalp). 04/15/22   [provider]  vitamin C (ASCORBIC ACID) 500 MG tablet Take 1,000 mg by mouth daily.    [provider]    Physical Exam    Vital Signs:  Robert Ochoa does not have vital signs available for review today.  Given telephonic nature of communication, physical exam is limited. AAOx3. NAD. Normal affect.  Speech and respirations are unlabored.  Accessory Clinical Findings    None  Assessment & Plan    1.  Preoperative Cardiovascular Risk Assessment:     Robert Ochoa perioperative risk of a major cardiac event is 0.4% according to the Revised Cardiac Risk Index (RCRI).  Therefore, he is at low risk for perioperative complications.   His functional capacity is fair -- but is due to his relatively debilitating back pain-- at 4.4 METs according to the Duke Activity Status Index (  DASI). Recommendations: According to ACC/AHA guidelines, no further cardiovascular testing needed.  The patient may proceed to surgery at acceptable risk.   Antiplatelet and/or Anticoagulation Recommendations:  Regarding Eliquis  for BOTH procedures (prostate biopsy and Lumbar ESI) , Eliquis  (Apixaban ) can be held for 3 days prior to surgery.  Please resume post op when felt to be safe.      The patient was advised that if he develops new symptoms prior to surgery to contact our office to arrange for a follow-up visit, and he verbalized understanding.    A copy of this note will be routed to requesting surgeon.  Time:   Today, I have spent 10 minutes with the patient with telehealth technology discussing medical history, symptoms, and management plan.     Robert JAYSON Hoover, NP  02/21/2024, 8:14 AM

## 2024-02-21 NOTE — Telephone Encounter (Signed)
 Patient has tele visit scheduled for today at 10:20.

## 2024-02-22 NOTE — Progress Notes (Deleted)
 [facility] [address] [place] [address] Phone: [phone] Subjective:    I'm seeing this patient by the request  of:  [name], MD  CC:   YEP:Dlagzrupcz  12/28/2023 I believe the patient has had more of a hip flexor tendinitis noted.  Seems to be right greater than left.  Discussed with patient having the pain worse after activity and rest and then actually truly with the activity makes this feel more like a tendinopathy than truly any arthritic changes.  Does have a history though also of prostate cancer so have a low partial to get advanced imaging if necessary.  Discussed icing regimen and home exercises otherwise, patient declined formal physical therapy today.  Work with the event organiser to learn in greater detail.  Follow-up again in 6 to 8 weeks      Update 02/23/2024 Ames Hoban is a 78 y.o. male coming in with complaint of R hip pain. Seen in ED for L hip pain on 02/06/2024. Saw Dr. Leonce for L hip and lumbar spine pain on 02/07/2024. Patient states       Past Medical History:  Diagnosis Date   Arrhythmogenic right ventricular dysplas [facility])    Arrhythmogenic right ventricular dysplasia (HCC)    Hx of VT s/p dual-chamber St. Jude ICD. Cardiac MRI 2010 showed moderately dilated RV with hypokinesis, LV EF 47%. EF 50-55% 2010 but down to 45% 05/2011. Cath 01/2009 without significant CAD.   Atrial fibrillation or flutter    Paroxysmal. Started on Pradaxa  05/2011.   Cardiac defibrillator in place    Elevated PSA    LV dysfunction    per echo 10/2011; EF 35%  // Echo 2/18: EF 40-45, diff HK, Gr 1 DD, mild dilated aortic root, mod LAE, mod RVE, mild reduced RVSF, mod RAE   Prostate cancer (HCC)    Septic shock(785.52) 10-27-11 cause unknown   with associated respiratory/renal failure/PAF/bradycardia   Ventricular tachycardia (HCC)    ICD discharges 05/2011 for this & AF-RVR - started on amiodarone  then.   Wears glasses    Past  Surgical History:  Procedure Laterality Date   APPENDECTOMY  2002   ATRIAL FIBRILLATION ABLATION N/A 02/04/2022   Procedure: ATRIAL FIBRILLATION ABLATION;  Surgeon: [name], MD;  [facility];  Service: Cardiovascular;  Laterality: N/A;   CARDIAC DEFIBRILLATOR PLACEMENT  02/19/09    St,. Jude    CARDIOVERSION N/A 05/28/2022   Procedure: CARDIOVERSION;  Surgeon: [name], MD;  Location: [facility]OSCOPY;  Service: Cardiovascular;  Laterality: N/A;   ICD GENERATOR CHANGEOUT N/A 03/27/2021   Procedure: ICD GENERATOR CHANGEOUT;  Surgeon: [name], MD;  Location: [facility];  Service: Cardiovascular;  Laterality: N/A;   IMPLANTABLE CARDIOVERTER DEFIBRILLATOR (ICD) GENERATOR CHANGE N/A 03/01/2013   Procedure: ICD GENERATOR CHANGE;  Surgeon: Elspeth BROCKS Sage, MD;  Location: [facility] CATH LAB;  Service: Cardiovascular;  Laterality: N/A;   IMPLANTABLE CARDIOVERTER DEFIBRILLATOR GENERATOR CHANGE  03/01/13   for premature battery depletetion.  Now has a SJM Ellipse DR device placed by Dr Sage   PROSTATE BIOPSY  11/2019   RADIOACTIVE SEED IMPLANT N/A 04/30/2020   Procedure: RADIOACTIVE SEED IMPLANT/BRACHYTHERAPY IMPLANT;  Surgeon: [name], MD;  Location: [facility];  Service: Urology;  Laterality: N/A;   SPACE OAR INSTILLATION N/A 04/30/2020   Procedure: SPACE OAR INSTILLATION;  Surgeon: [name], MD;  Location: [facility];  Service: Urology;  Laterality: N/A;   Social History   Socioeconomic History   Marital status: Single    Spouse name: Not on file   Number of children: 1   Years of education: Not on file   Highest education level: Not on file  Occupational History    Employer: [employer]    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 Self employed.    Social Drivers of Corporate Investment Banker Strain: Not on file  Food Insecurity: No Food Insecurity (01/31/2024)   Hunger Vital Sign    Worried About Running Out of Food in the Last Year: Never true    Ran Out of Food in the Last Year: Never true  Transportation Needs: No Transportation Needs (01/31/2024)   PRAPARE - Administrator, Civil Service (Medical): No    Lack of Transportation (Non-Medical): No  Physical Activity: Not on file  Stress: Not on file  Social Connections: Not on file   No Known Allergies Family History  Problem Relation Age of Onset   Heart attack Father    Allergies Mother    Cancer Paternal Aunt        type unknown   Prostate cancer Cousin    Colon cancer Neg Hx    Pancreatic cancer Neg Hx    Breast cancer Neg Hx      Current Outpatient Medications (Cardiovascular):    amiodarone  (PACERONE ) 100 MG tablet, TAKE 1 TABLET(100 MG) BY MOUTH DAILY   furosemide  (LASIX ) 20 MG tablet, TAKE 1 TABLET BY MOUTH DAILY AS NEEDED FOR SWELLING   lisinopril  (ZESTRIL ) 10 MG tablet, TAKE 2 TABLETS BY MOUTH EVERY DAY   metoprolol  succinate (TOPROL -XL) 50 MG 24 hr tablet, Take 1 tablet (50 mg total) by mouth daily. Take with or immediately following a meal.   sildenafil  (REVATIO ) 20 MG tablet, Take 20-100 mg by mouth daily as needed (ED).    Current Outpatient Medications (Hematological):    ELIQUIS  5 MG TABS tablet, TAKE 1 TABLET BY MOUTH TWICE DAILY  Current Outpatient Medications (Other):    Calcium-Magnesium -Zinc (CAL-MAG-ZINC PO), Take 1 tablet by mouth daily.   Carboxymethylcellul-Glycerin (LUBRICATING EYE DROPS OP),  Place 1 drop into both eyes daily as needed (dry eyes).   gabapentin  (NEURONTIN ) 100 MG capsule, Take 2 capsules (200 mg total) by mouth at bedtime.   Omega-3 Fatty Acids (OMEGA 3 PO), Take 1,600 mg by mouth daily.   tamsulosin (FLOMAX) 0.4 MG CAPS capsule, Take 0.4 mg by mouth daily.   triamcinolone cream (KENALOG) 0.1 %, Apply 1 Application topically 2 (two) times daily as needed (itchy scalp). (Patient taking differently: Apply 1 Application topically as needed (itchy scalp).)   vitamin C (ASCORBIC ACID) 500 MG tablet, Take 1,000 mg by mouth daily.   Reviewed prior external information including notes and imaging from  primary care provider As well as notes that were available from care  everywhere and other healthcare systems.  Past medical history, social, surgical and family history all reviewed in electronic medical record.  No pertanent information unless stated regarding to the chief complaint.   Review of Systems:  No headache, visual changes, nausea, vomiting, diarrhea, constipation, dizziness, abdominal pain, skin rash, fevers, chills, night sweats, weight loss, swollen lymph nodes, body aches, joint swelling, chest pain, shortness of breath, mood changes. POSITIVE muscle aches  Objective  There were no vitals taken for this visit.   General: No apparent distress alert and oriented x3 mood and affect normal, dressed appropriately.  HEENT: Pupils equal, extraocular movements intact  Respiratory: Patient's speak in full sentences and does not appear short of breath  Cardiovascular: No lower extremity edema, non tender, no erythema      Impression and Recommendations:

## 2024-02-28 ENCOUNTER — Ambulatory Visit: Admitting: Family Medicine

## 2024-02-28 DIAGNOSIS — R9721 Rising PSA following treatment for malignant neoplasm of prostate: Secondary | ICD-10-CM | POA: Diagnosis not present

## 2024-02-28 DIAGNOSIS — R972 Elevated prostate specific antigen [PSA]: Secondary | ICD-10-CM | POA: Diagnosis not present

## 2024-02-28 DIAGNOSIS — N4232 Atypical small acinar proliferation of prostate: Secondary | ICD-10-CM | POA: Diagnosis not present

## 2024-02-29 ENCOUNTER — Ambulatory Visit

## 2024-02-29 ENCOUNTER — Telehealth: Payer: Self-pay | Admitting: Orthopedic Surgery

## 2024-02-29 ENCOUNTER — Ambulatory Visit: Admitting: Family Medicine

## 2024-02-29 VITALS — BP 110/82 | HR 78 | Ht 74.0 in | Wt 216.0 lb

## 2024-02-29 DIAGNOSIS — M25562 Pain in left knee: Secondary | ICD-10-CM

## 2024-02-29 DIAGNOSIS — G8929 Other chronic pain: Secondary | ICD-10-CM | POA: Diagnosis not present

## 2024-02-29 DIAGNOSIS — M25561 Pain in right knee: Secondary | ICD-10-CM | POA: Diagnosis not present

## 2024-02-29 DIAGNOSIS — M1710 Unilateral primary osteoarthritis, unspecified knee: Secondary | ICD-10-CM | POA: Diagnosis not present

## 2024-02-29 DIAGNOSIS — M4317 Spondylolisthesis, lumbosacral region: Secondary | ICD-10-CM | POA: Diagnosis not present

## 2024-02-29 DIAGNOSIS — M25569 Pain in unspecified knee: Secondary | ICD-10-CM | POA: Diagnosis not present

## 2024-02-29 DIAGNOSIS — F411 Generalized anxiety disorder: Secondary | ICD-10-CM | POA: Diagnosis not present

## 2024-02-29 NOTE — Assessment & Plan Note (Signed)
 Xray ordered discussed with patient about icing regimen.  Do think it is more secondary to lumbar radiculopathy.

## 2024-02-29 NOTE — Progress Notes (Signed)
 [facility] [address] [place] [address] Phone: [phone] Subjective:   Robert Ochoa, am serving as a scribe for Dr. Arthea Ochoa.  I'm seeing this patient by the request  of:  [name], MD  CC:   YEP:Dlagzrupcz  12/28/2023 I believe the patient has had more of a hip flexor tendinitis noted.  Seems to be right greater than left.  Discussed with patient having the pain worse after activity and rest and then actually truly with the activity makes this feel more like a tendinopathy than truly any arthritic changes.  Does have a history though also of prostate cancer so have a low partial to get advanced imaging if necessary.  Discussed icing regimen and home exercises otherwise, patient declined formal physical therapy today.  Work with the event organiser to learn in greater detail.  Follow-up again in 6 to 8 weeks      Update 02/23/2024 Robert Ochoa is a 78 y.o. male coming in with complaint of R hip pain. Seen in ED for L hip pain on 02/06/2024. Saw Dr. Leonce for L hip and lumbar spine pain on 02/07/2024. Patient states has been getting better since last visit with Dr. Leonce. Has epidural schedule 12/9. Less back pain for leg and hip pain.  CT of the lumbar spine was ordered showing a chronic L5 pars defect with foraminal stenosis of L3-S1 patient does have an injection in the lumbar spine scheduled for December 9.   Has seen neurosurgery and has been awaiting their decision on surgery.  Cardiology has completed the preoperative clearance on November 24. Past Medical History:  Diagnosis Date   Arrhythmogenic right ventricular dysplas [facility])    Arrhythmogenic right ventricular dysplasia (HCC)    Hx of VT s/p dual-chamber St. Jude ICD. Cardiac MRI 2010 showed moderately dilated RV with hypokinesis, LV EF 47%. EF 50-55% 2010 but down to 45% 05/2011. Cath 01/2009 without significant CAD.   Atrial fibrillation or flutter    Paroxysmal.  Started on Pradaxa  05/2011.   Cardiac defibrillator in place    Elevated PSA    LV dysfunction    per echo 10/2011; EF 35%  // Echo 2/18: EF 40-45, diff HK, Gr 1 DD, mild dilated aortic root, mod LAE, mod RVE, mild reduced RVSF, mod RAE   Prostate cancer (HCC)    Septic shock(785.52) 10-27-11 cause unknown   with associated respiratory/renal failure/PAF/bradycardia   Ventricular tachycardia (HCC)    ICD discharges 05/2011 for this & AF-RVR - started on amiodarone  then.   Wears glasses    Past Surgical History:  Procedure Laterality Date   APPENDECTOMY  2002   ATRIAL FIBRILLATION ABLATION N/A 02/04/2022   Procedure: ATRIAL FIBRILLATION ABLATION;  Surgeon: [name], MD;  [facility];  Service: Cardiovascular;  Laterality: N/A;   CARDIAC DEFIBRILLATOR PLACEMENT  02/19/09    St,. Jude    CARDIOVERSION N/A 05/28/2022   Procedure: CARDIOVERSION;  Surgeon: [name], MD;  Location: [facility] ENDOSCOPY;  Service: Cardiovascular;  Laterality: N/A;   ICD GENERATOR CHANGEOUT N/A 03/27/2021   Procedure: ICD GENERATOR CHANGEOUT;  Surgeon: [name], MD;  [facility];  Service: Cardiovascular;  Laterality: N/A;   IMPLANTABLE CARDIOVERTER DEFIBRILLATOR (ICD) GENERATOR CHANGE N/A 03/01/2013   Procedure: ICD GENERATOR CHANGE;  Surgeon: Elspeth BROCKS Sage, MD;  Location: [facility] CATH LAB;  Service: Cardiovascular;  Laterality: N/A;   IMPLANTABLE CARDIOVERTER DEFIBRILLATOR GENERATOR CHANGE  03/01/13  for premature battery depletetion.  Now has a SJM Ellipse DR device placed by Dr Fernande   PROSTATE BIOPSY  11/2019   RADIOACTIVE SEED IMPLANT N/A 04/30/2020   Procedure: RADIOACTIVE SEED IMPLANT/BRACHYTHERAPY IMPLANT;  Surgeon: [name], MD;  Location: [facility];  Service: Urology;  Laterality: N/A;   SPACE OAR INSTILLATION N/A 04/30/2020   Procedure: SPACE OAR INSTILLATION;  Surgeon: [name], MD;  Location: [facility];  Service: Urology;  Laterality: N/A;   Social History   Socioeconomic History   Marital status: Single    Spouse name: Not on file   Number of children: 1   Years of education: Not on file   Highest education level: Not on file  Occupational History    Employer: [employer]    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 Self employed.    Social Drivers of Corporate Investment Banker Strain: Not on file  Food Insecurity: No Food Insecurity (01/31/2024)   Hunger Vital Sign    Worried About Running Out of Food in the Last Year: Never true    Ran Out of Food in the Last Year: Never true  Transportation Needs: No Transportation Needs (01/31/2024)   PRAPARE - Administrator, Civil Service (Medical): No    Lack of Transportation (Non-Medical): No  Physical Activity: Not on file  Stress: Not on file  Social Connections: Not on file   No Known Allergies Family History  Problem Relation Age of Onset   Heart attack Father    Allergies Mother    Cancer Paternal Aunt        type unknown   Prostate cancer Cousin    Colon cancer Neg Hx    Pancreatic cancer Neg Hx    Breast cancer Neg Hx      Current Outpatient Medications (Cardiovascular):    amiodarone  (PACERONE ) 100 MG tablet, TAKE 1 TABLET(100 MG) BY MOUTH DAILY   furosemide  (LASIX ) 20 MG tablet, TAKE 1 TABLET BY MOUTH DAILY AS NEEDED FOR SWELLING   lisinopril  (ZESTRIL ) 10 MG tablet, TAKE 2 TABLETS BY MOUTH EVERY DAY   metoprolol   succinate (TOPROL -XL) 50 MG 24 hr tablet, Take 1 tablet (50 mg total) by mouth daily. Take with or immediately following a meal.   sildenafil  (REVATIO ) 20 MG tablet, Take 20-100 mg by mouth daily as needed (ED).    Current Outpatient Medications (Hematological):    ELIQUIS  5 MG TABS tablet, TAKE 1 TABLET BY MOUTH TWICE DAILY  Current Outpatient Medications (Other):    Calcium-Magnesium -Zinc (CAL-MAG-ZINC PO), Take 1 tablet by mouth daily.   Carboxymethylcellul-Glycerin (LUBRICATING EYE DROPS OP), Place 1 drop into both eyes daily as needed (dry eyes).   gabapentin  (NEURONTIN ) 100 MG capsule, Take 2 capsules (200 mg total) by mouth at bedtime.   Omega-3 Fatty Acids (OMEGA 3 PO), Take 1,600 mg by mouth daily.  tamsulosin (FLOMAX) 0.4 MG CAPS capsule, Take 0.4 mg by mouth daily.   triamcinolone cream (KENALOG) 0.1 %, Apply 1 Application topically 2 (two) times daily as needed (itchy scalp). (Patient taking differently: Apply 1 Application topically as needed (itchy scalp).)   vitamin C (ASCORBIC ACID) 500 MG tablet, Take 1,000 mg by mouth daily.   Reviewed prior external information including notes and imaging from  primary care provider As well as notes that were available from care everywhere and other healthcare systems.  Past medical history, social, surgical and family history all reviewed in electronic medical record.  No pertanent information unless stated regarding to the chief complaint.   Review of Systems:  No headache, visual changes, nausea, vomiting, diarrhea, constipation, dizziness, abdominal pain, skin rash, fevers, chills, night sweats, weight loss, swollen lymph nodes, body aches, joint swelling, chest pain, shortness of breath, mood changes. POSITIVE muscle aches  Objective  Blood pressure 110/82, pulse 78, height 6' 2 (1.88 m), weight 216 lb (98 kg), SpO2 98%.   General: No apparent distress alert and oriented x3 mood and affect normal, dressed appropriately.   HEENT: Pupils equal, extraocular movements intact  Respiratory: Patient's speak in full sentences and does not appear short of breath  Cardiovascular: No lower extremity edema, non tender, no erythema  Patient does have significant loss of lordosis noted.  Some tenderness to palpation in the paraspinal musculature.  Patient does have difficulty going from seated to standing but then able to start walking without any significant difficulty.    Impression and Recommendations:    The above documentation has been reviewed and is accurate and complete Henrik Orihuela M Jeziah Kretschmer, DO

## 2024-02-29 NOTE — Assessment & Plan Note (Signed)
 Discussed with patient about the spondylolisthesis.  We discussed with patient with the CT scan that an epidural could be beneficial.  Scheduled in 1 week.  Discussed other treatment options including medications, formal physical therapy, discussed the possibility of CT pelvis if this continues as well.  Patient will follow-up with me again in 6 weeks after the epidural. I personally spent a total of 33 minutes in the care of the patient today including preparing to see the patient, getting/reviewing separately obtained history, performing a medically appropriate exam/evaluation, counseling and educating, documenting clinical information in the EHR, independently interpreting results, communicating results, and coordinating care.

## 2024-02-29 NOTE — Patient Instructions (Addendum)
 Can try infrared therapy Xrays today Good to see you! See you again 6 weeks after epidural

## 2024-02-29 NOTE — Telephone Encounter (Signed)
 We finally got his PT notes. I am sorry it took so long. He had initial PT evaluation on 09/24/23 and his last visit was on 10/20/23.   He would need to complete at least 2 more visits of PT prior to any surgical discussion. Please let him know.   I can put in PT orders, but need to know where he wants to go.

## 2024-03-03 NOTE — Progress Notes (Signed)
 Patient had biopsy on 12/1, path pending.  Patient will have follow up with Dr. Devere on 12/8.  Will continue to follow.

## 2024-03-06 ENCOUNTER — Encounter

## 2024-03-06 DIAGNOSIS — R9721 Rising PSA following treatment for malignant neoplasm of prostate: Secondary | ICD-10-CM | POA: Diagnosis not present

## 2024-03-06 DIAGNOSIS — C61 Malignant neoplasm of prostate: Secondary | ICD-10-CM | POA: Diagnosis not present

## 2024-03-06 NOTE — Discharge Instructions (Signed)

## 2024-03-07 ENCOUNTER — Inpatient Hospital Stay: Admission: RE | Admit: 2024-03-07 | Discharge: 2024-03-07 | Attending: Sports Medicine

## 2024-03-07 ENCOUNTER — Ambulatory Visit: Payer: Self-pay | Admitting: Family Medicine

## 2024-03-07 DIAGNOSIS — M5416 Radiculopathy, lumbar region: Secondary | ICD-10-CM

## 2024-03-07 DIAGNOSIS — M4727 Other spondylosis with radiculopathy, lumbosacral region: Secondary | ICD-10-CM | POA: Diagnosis not present

## 2024-03-07 MED ORDER — IOPAMIDOL (ISOVUE-M 200) INJECTION 41%
1.0000 mL | Freq: Once | INTRAMUSCULAR | Status: AC
  Administered 2024-03-07: 1 mL via EPIDURAL

## 2024-03-07 MED ORDER — METHYLPREDNISOLONE ACETATE 40 MG/ML INJ SUSP (RADIOLOG
80.0000 mg | Freq: Once | INTRAMUSCULAR | Status: AC
  Administered 2024-03-07: 80 mg via EPIDURAL

## 2024-03-07 NOTE — Telephone Encounter (Signed)
 Complete

## 2024-03-08 ENCOUNTER — Telehealth (HOSPITAL_BASED_OUTPATIENT_CLINIC_OR_DEPARTMENT_OTHER): Payer: Self-pay | Admitting: *Deleted

## 2024-03-08 DIAGNOSIS — E041 Nontoxic single thyroid nodule: Secondary | ICD-10-CM | POA: Diagnosis not present

## 2024-03-08 NOTE — Telephone Encounter (Signed)
° °  Pre-operative Risk Assessment    Patient Name: Embry Huss  DOB: Feb 01, 1946 MRN: [id number]   Date of last office visit: 05/24/23 DR. KLEIN; 01/25/24 DARIL KICKS, PAC Date of next office visit: 05/09/24 DR. CAMNITZ   Request for Surgical Clearance    Procedure:  FINE NEEDLE ASPIRATION OF A THYROID  NODULE  Date of Surgery:  Clearance TBD                                Surgeon:  DR. TODD GERKIN Surgeon's Group or Practice Name:  [facility] Phone number:  [phone] Fax number:  [phone] BURNARD CRETE, LPN   Type of Clearance Requested:   - Medical  - Pharmacy:  Hold Apixaban  (Eliquis )     Type of Anesthesia:  Not Indicated   Additional requests/questions:    Bonney Niels Jest   03/08/2024, 11:37 AM

## 2024-03-09 ENCOUNTER — Encounter: Payer: Self-pay | Admitting: Surgery

## 2024-03-10 ENCOUNTER — Other Ambulatory Visit: Payer: Self-pay | Admitting: Surgery

## 2024-03-10 DIAGNOSIS — E041 Nontoxic single thyroid nodule: Secondary | ICD-10-CM

## 2024-03-14 ENCOUNTER — Ambulatory Visit
Admission: RE | Admit: 2024-03-14 | Discharge: 2024-03-14 | Disposition: A | Source: Ambulatory Visit | Attending: Radiation Oncology

## 2024-03-14 ENCOUNTER — Encounter: Payer: Self-pay | Admitting: Radiation Oncology

## 2024-03-14 ENCOUNTER — Telehealth: Payer: Self-pay | Admitting: Radiation Oncology

## 2024-03-14 ENCOUNTER — Ambulatory Visit
Admission: RE | Admit: 2024-03-14 | Discharge: 2024-03-14 | Disposition: A | Source: Ambulatory Visit | Attending: Radiation Oncology | Admitting: Radiation Oncology

## 2024-03-14 VITALS — BP 144/95 | HR 87 | Temp 97.8°F | Resp 20 | Ht 74.0 in | Wt 217.0 lb

## 2024-03-14 DIAGNOSIS — Z809 Family history of malignant neoplasm, unspecified: Secondary | ICD-10-CM | POA: Diagnosis not present

## 2024-03-14 DIAGNOSIS — Z8042 Family history of malignant neoplasm of prostate: Secondary | ICD-10-CM | POA: Diagnosis not present

## 2024-03-14 DIAGNOSIS — C61 Malignant neoplasm of prostate: Secondary | ICD-10-CM

## 2024-03-14 DIAGNOSIS — F411 Generalized anxiety disorder: Secondary | ICD-10-CM | POA: Diagnosis not present

## 2024-03-14 DIAGNOSIS — Z95810 Presence of automatic (implantable) cardiac defibrillator: Secondary | ICD-10-CM | POA: Diagnosis not present

## 2024-03-14 DIAGNOSIS — M5416 Radiculopathy, lumbar region: Secondary | ICD-10-CM | POA: Diagnosis not present

## 2024-03-14 DIAGNOSIS — Z7901 Long term (current) use of anticoagulants: Secondary | ICD-10-CM | POA: Diagnosis not present

## 2024-03-14 DIAGNOSIS — I428 Other cardiomyopathies: Secondary | ICD-10-CM | POA: Diagnosis not present

## 2024-03-14 DIAGNOSIS — Z79899 Other long term (current) drug therapy: Secondary | ICD-10-CM | POA: Diagnosis not present

## 2024-03-14 DIAGNOSIS — I4891 Unspecified atrial fibrillation: Secondary | ICD-10-CM | POA: Diagnosis not present

## 2024-03-14 DIAGNOSIS — R9721 Rising PSA following treatment for malignant neoplasm of prostate: Secondary | ICD-10-CM | POA: Diagnosis not present

## 2024-03-14 NOTE — Progress Notes (Signed)
 GU Location of Tumor / Histology: Prostate Ca (biochemically recurrent)   PSA 2.2 on 12/23/2023  Robert Ochoa presented as referral from Dr. Lonni Han [facility]).  Biopsies     01/11/2024 Dr. Lonni Han NM PET (PSMA) Skull to Mid Thigh CLINICAL DATA:  WOV Verification; 8.50 mCi F18-POSLUMA  @ 1436 IV in RAC//BWM-GT; PSA: 2.2 on 12/27/2023; Per patient: biopsy 6 months ago, no recent surgeries, radioactive seed implantation 04/30/2020.  IMPRESSION: 1. Multifocal prostatic tracer affinity in the setting of radiation seeds, suspicious for residual viable disease. No evidence of tracer-avid metastatic disease. 2. New right-sided thyroid  nodule with tracer affinity (SUV 3.2). Per consensual criteria, this warrants further evaluation with thyroid  ultrasound and consideration for sampling. 3. Pulmonary artery enlargement suggestive of pulmonary arterial hypertension.   Past/Anticipated interventions by urology, if any:  Dr. Lonni Han    Past/Anticipated interventions by medical oncology, if any: NA  Weight changes, if any:  No  IPSS:  6 SHIM: 1  no sexual activity  Bowel/Bladder complaints, if any: No    Nausea/Vomiting, if any: No  Pain issues, if any:  0/10  SAFETY ISSUES: Prior radiation? Yes, Brachytherapy  (January 2022) Pacemaker/ICD? Yes, Implantable Cardioverter-Defibrillator Possible current pregnancy? Male Is the patient on methotrexate? No  Current Complaints / other details: None  30 minutes spent total, including time for meaningful use questions, reviewing medication, as well as spent in face-to-face time in nurse evaluation with the patient.

## 2024-03-14 NOTE — Telephone Encounter (Signed)
LVM to schedule consult with Dr. Manning.  ?

## 2024-03-14 NOTE — Progress Notes (Signed)
 Radiation Oncology         [phone] ________________________________  Outpatient Follow up   Name: Robert Ochoa MRN: [id number]  Date of Service: 03/14/2024 DOB: [birth year]  CC:Robert Righter, MD  Robert Lonni Righter, MD   REFERRING PHYSICIAN: [name], MD  DIAGNOSIS: 78 y.o. man with suspected locally recurrent prostate cancer involving the right seminal vesicle with PSA of 2.2 s/p brachytherapy in 2022 for treatment of Gleason 4+3 adenocarcinoma of the prostate.    ICD-10-CM   1. Biochemically recurrent malignant neoplasm of prostate [facility])  C61    R97.21       HISTORY OF PRESENT ILLNESS: Robert Ochoa is a 78 y.o. male seen at the request of Dr. Devere. He is well known to our service having previously undergone brachytherapy for treatment of his Gleason 4+3 prostate cancer on 04/30/20. His pretreatment PSA was 5.98 and reached a nadir of 0.25 in 09/2021.  Unfortunately, the patient's PSA level has been gradually rising since that time, at 0.62 in 11/2022, and 0.99 in 03/2023, which prompted a repeat prostate biopsy. TRUSPBx was performed on 06/21/23 and pathology was negative, showing only evidence of radiation changes, without active disease. His most recent PSA from 12/27/23 showed a further rise to 2.2 which prompted a restaging PSMA PET scan that was performed on 01/11/24;, We personally reviewed the imaging with the patient today which appears to show tracer activity in the right seminal vesicle, suspicious for local recurrence but no evidence of tracer-avid metastatic disease or lymphadenopathy. Incidentally noted was a new right-sided thyroid  nodule with tracer affinity that warrants further evaluation with an U/S and likely biopsy.  Since we saw him last, he has undergone repeat TRUSPBx of the prostate with targeted sampling of the right seminal vesicle on 02/28/2024, under the care of Dr. Devere.  His final pathology showed only atypia, no obvious malignancy.   He reviewed these results with his urologist and returns today for further discussion regarding management of his rising PSA and PSMA PET avidity noted in the right seminal vesicle suggesting a local recurrence of his prostate cancer despite negative pathology.  PREVIOUS RADIATION THERAPY: Yes  04/30/20:  Insertion of radioactive I-125 seeds into the prostate gland; 145 Gy, definitive therapy with placement of SpaceOAR gel.   PAST MEDICAL HISTORY:  Past Medical History:  Diagnosis Date   Arrhythmogenic right ventricular dysplas (HCC)    Arrhythmogenic right ventricular dysplasia (HCC)    Hx of VT s/p dual-chamber St. Jude ICD. Cardiac MRI 2010 showed moderately dilated RV with hypokinesis, LV EF 47%. EF 50-55% 2010 but down to 45% 05/2011. Cath 01/2009 without significant CAD.   Atrial fibrillation or flutter    Paroxysmal. Started on Pradaxa  05/2011.   Cardiac defibrillator in place    Elevated PSA    LV dysfunction    per echo 10/2011; EF 35%  // Echo 2/18: EF 40-45, diff HK, Gr 1 DD, mild dilated aortic root, mod LAE, mod RVE, mild reduced RVSF, mod RAE   Prostate cancer (HCC)    Septic shock(785.52) 10-27-11 cause unknown   with associated respiratory/renal failure/PAF/bradycardia   Ventricular tachycardia (HCC)    ICD discharges 05/2011 for this & AF-RVR - started on amiodarone  then.   Wears glasses       PAST SURGICAL HISTORY: Past Surgical History:  Procedure Laterality Date   APPENDECTOMY  2002   ATRIAL FIBRILLATION ABLATION N/A 02/04/2022   Procedure: ATRIAL FIBRILLATION ABLATION;  Surgeon: [name], MD;  Location:  [facility] INVASIVE CV LAB;  Service: Cardiovascular;  Laterality: N/A;   CARDIAC DEFIBRILLATOR PLACEMENT  02/19/09    St,. Jude    CARDIOVERSION N/A 05/28/2022   Procedure: CARDIOVERSION;  Surgeon: [name], MD;  Location: [facility] ENDOSCOPY;  Service: Cardiovascular;  Laterality: N/A;   ICD GENERATOR CHANGEOUT N/A 03/27/2021   Procedure: ICD GENERATOR CHANGEOUT;   Surgeon: [name], MD;  Location: [facility] INVASIVE CV LAB;  Service: Cardiovascular;  Laterality: N/A;   IMPLANTABLE CARDIOVERTER DEFIBRILLATOR (ICD) GENERATOR CHANGE N/A 03/01/2013   Procedure: ICD GENERATOR CHANGE;  Surgeon: Elspeth BROCKS Sage, MD;  Location: [facility] CATH LAB;  Service: Cardiovascular;  Laterality: N/A;   IMPLANTABLE CARDIOVERTER DEFIBRILLATOR GENERATOR CHANGE  03/01/13   for premature battery depletetion.  Now has a SJM Ellipse DR device placed by Dr Sage   PROSTATE BIOPSY  11/2019   RADIOACTIVE SEED IMPLANT N/A 04/30/2020   Procedure: RADIOACTIVE SEED IMPLANT/BRACHYTHERAPY IMPLANT;  Surgeon: Robert Lonni Righter, MD;  Location: [facility];  Service: Urology;  Laterality: N/A;   SPACE OAR INSTILLATION N/A 04/30/2020   Procedure: SPACE OAR INSTILLATION;  Surgeon: Robert Lonni Righter, MD;  Location: [facility];  Service: Urology;  Laterality: N/A;    FAMILY HISTORY:  Family History  Problem Relation Age of Onset   Heart attack Father    Allergies Mother    Cancer Paternal Aunt        type unknown   Prostate cancer Cousin    Colon cancer Neg Hx    Pancreatic cancer Neg Hx    Breast cancer Neg Hx     SOCIAL HISTORY:  Social History   Socioeconomic History   Marital status: Single    Spouse name: Not on file   Number of children: 1   Years of education: Not on file   Highest education level: Not on file  Occupational History    Employer: [employer]    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 Self employed.    Social Drivers of Health   Tobacco Use: Low Risk (03/14/2024)   Patient History    Smoking Tobacco Use: Never    Smokeless Tobacco Use: Never    Passive Exposure: Not on file  Financial Resource Strain: Not on file  Food Insecurity: No Food Insecurity (01/31/2024)   Epic    Worried About Programme Researcher, Broadcasting/film/video in the Last Year: Never true    Ran Out of Food in the Last Year: Never true  Transportation Needs: No Transportation Needs (01/31/2024)   Epic    Lack of Transportation (Medical): No    Lack of Transportation (Non-Medical): No  Physical Activity: Not on file  Stress: Not on file  Social Connections: Not on file  Intimate Partner Violence: Not At Risk (01/31/2024)   Epic    Fear of Current or Ex-Partner: No    Emotionally Abused: No  Physically Abused: No    Sexually Abused: No  Depression (PHQ2-9): Low Risk (01/31/2024)   Depression (PHQ2-9)    PHQ-2 Score: 0  Alcohol Screen: Low Risk (01/31/2024)   Alcohol Screen    Last Alcohol Screening Score (AUDIT): 0  Housing: Unknown (03/08/2024)   Received from [facility]   Epic    Unable to Pay for Housing in the Last Year: Not on file    Number of Times Moved in the Last Year: Not on file    At any time in the past 12 months, were you homeless or living in a shelter (including now)?: No  Utilities: Not At Risk (01/31/2024)   Epic    Threatened with loss of utilities: No  Health Literacy: Not on file    ALLERGIES: Patient has no known allergies.  MEDICATIONS:  Current Outpatient Medications  Medication Sig Dispense Refill   amiodarone  (PACERONE ) 100 MG tablet TAKE 1 TABLET(100 MG) BY MOUTH DAILY 90 tablet 2   Calcium-Magnesium -Zinc (CAL-MAG-ZINC PO) Take 1 tablet by mouth daily.     Carboxymethylcellul-Glycerin (LUBRICATING EYE DROPS OP) Place 1 drop into both eyes daily as needed (dry eyes).     ELIQUIS  5 MG TABS tablet TAKE 1 TABLET  BY MOUTH TWICE DAILY 60 tablet 5   furosemide  (LASIX ) 20 MG tablet TAKE 1 TABLET BY MOUTH DAILY AS NEEDED FOR SWELLING 30 tablet 3   gabapentin  (NEURONTIN ) 100 MG capsule Take 2 capsules (200 mg total) by mouth at bedtime. 180 capsule 0   lisinopril  (ZESTRIL ) 10 MG tablet TAKE 2 TABLETS BY MOUTH EVERY DAY 180 tablet 1   metoprolol  succinate (TOPROL -XL) 50 MG 24 hr tablet Take 1 tablet (50 mg total) by mouth daily. Take with or immediately following a meal. 90 tablet 3   Omega-3 Fatty Acids (OMEGA 3 PO) Take 1,600 mg by mouth daily.     sildenafil  (REVATIO ) 20 MG tablet Take 20-100 mg by mouth daily as needed (ED).     tamsulosin (FLOMAX) 0.4 MG CAPS capsule Take 0.4 mg by mouth daily.     triamcinolone cream (KENALOG) 0.1 % Apply 1 Application topically 2 (two) times daily as needed (itchy scalp). (Patient taking differently: Apply 1 Application topically as needed (itchy scalp).)     vitamin C (ASCORBIC ACID) 500 MG tablet Take 1,000 mg by mouth daily.     No current facility-administered medications for this encounter.    REVIEW OF SYSTEMS:  On review of systems, the patient reports that he is doing well overall. He denies any chest pain, shortness of breath, cough, fevers, chills, night sweats, unintended weight changes. He denies any bowel or bladder disturbances, and denies abdominal pain, nausea or vomiting. He denies any new musculoskeletal or joint aches or pains. A complete review of systems is obtained and is otherwise negative.    PHYSICAL EXAM:  Wt Readings from Last 3 Encounters:  03/14/24 217 lb (98.4 kg)  02/29/24 216 lb (98 kg)  02/06/24 212 lb (96.2 kg)   Temp Readings from Last 3 Encounters:  03/14/24 97.8 F (36.6 C)  02/06/24 98.3 F (36.8 C) (Oral)  02/06/24 98.4 F (36.9 C) (Oral)   BP Readings from Last 3 Encounters:  03/14/24 (!) 144/95  03/07/24 125/89  02/29/24 110/82   Pulse Readings from Last 3 Encounters:  03/14/24 87  03/07/24 70  02/29/24 78    Pain Assessment Pain Score: 0-No pain/10  In general this is a well appearing Caucasian man in  no acute distress. He's alert and oriented x4 and appropriate throughout the examination. Cardiopulmonary assessment is negative for acute distress and he exhibits normal effort.     KPS = 100  100 - Normal; no complaints; no evidence of disease. 90   - Able to carry on normal activity; minor signs or symptoms of disease. 80   - Normal activity with effort; some signs or symptoms of disease. 52   - Cares for self; unable to carry on normal activity or to do active work. 60   - Requires occasional assistance, but is able to care for most of his personal needs. 50   - Requires considerable assistance and frequent medical care. 40   - Disabled; requires special care and assistance. 30   - Severely disabled; hospital admission is indicated although death not imminent. 20   - Very sick; hospital admission necessary; active supportive treatment necessary. 10   - Moribund; fatal processes progressing rapidly. 0     - Dead  Karnofsky DA, Abelmann WH, Craver LS and Burchenal JH [phone]) The use of the nitrogen mustards in the palliative treatment of carcinoma: with particular reference to bronchogenic carcinoma Cancer 1 634-56  LABORATORY DATA:  Lab Results  Component Value Date   WBC 4.9 05/24/2023   HGB 17.1 05/24/2023   HCT 52.2 (H) 05/24/2023   MCV 90 05/24/2023   PLT 194 05/24/2023   Lab Results  Component Value Date   NA 138 01/25/2024   K 4.7 01/25/2024   CL 103 01/25/2024   CO2 20 01/25/2024   Lab Results  Component Value Date   ALT 20 01/25/2024   AST 23 01/25/2024   ALKPHOS 91 01/25/2024   BILITOT 0.7 01/25/2024     RADIOGRAPHY: DG INJECT DIAG/THERA/INC NEEDLE/CATH/PLC EPI/LUMB/SAC W/IMG Result Date: 03/07/2024 CLINICAL DATA:  Lumbosacral spondylosis without myelopathy. Lumbar radiculopathy. Partial response to lumbar epidural injections in May and August. Recurrent left  lower extremity pain and weakness. Repeat injection requested at L3-4. FLUOROSCOPY: Radiation Exposure Index (as provided by the fluoroscopic device): 5.90 mGy Kerma PROCEDURE: The procedure, risks, benefits, and alternatives were explained to the patient. Questions regarding the procedure were encouraged and answered. The patient understands and consents to the procedure. LUMBAR EPIDURAL INJECTION: An interlaminar approach was performed on the left at L3-4. The overlying skin was cleansed and anesthetized. A 3.5 inch 20 gauge epidural needle was advanced using loss-of-resistance technique. DIAGNOSTIC EPIDURAL INJECTION Injection of Isovue -M 200 shows a good epidural pattern with spread above and below the level of needle placement primarily on the left. No vascular opacification is seen. THERAPEUTIC EPIDURAL INJECTION: 80 mg of Depo-Medrol  mixed with 3 mL of 1% lidocaine  were instilled. The procedure was well-tolerated, and the patient was discharged thirty minutes following the injection in good condition. COMPLICATIONS: None immediate IMPRESSION: Technically successful interlaminar epidural injection on the left at L3-4. Electronically Signed   By: Dasie Hamburg M.D.   On: 03/07/2024 14:21   DG Knee AP/LAT W/Sunrise Left Result Date: 03/07/2024 EXAM: 3 VIEW(S) XRAY OF THE KNEE 02/29/2024 01:53:25 PM COMPARISON: None available. CLINICAL HISTORY: knee pain FINDINGS: BONES AND JOINTS: No acute fracture. No malalignment. No significant joint effusion. Mild tricompartemental joint space narrowing. SOFT TISSUES: The soft tissues are unremarkable. IMPRESSION: 1. No acute osseous abnormality. Electronically signed by: Morgane Naveau MD 03/07/2024 03:10 AM EST RP Workstation: [id number]   DG Knee AP/LAT W/Sunrise Right Result Date: 03/07/2024 EXAM: 3 VIEW(S) XRAY OF THE KNEE 02/29/2024 01:53:25 PM COMPARISON: None  available. CLINICAL HISTORY: knee pain FINDINGS: BONES AND JOINTS: No acute fracture. No malalignment.  No significant joint effusion. Mild tricompartmental osteoarthritis. SOFT TISSUES: Vascular calcifications. IMPRESSION: 1. No acute osseous abnormality. Electronically signed by: Morgane Naveau MD 03/07/2024 03:10 AM EST RP Workstation: [id number]      IMPRESSION/PLAN: 1. 78 y.o. man with suspected locally recurrent prostate cancer involving the right seminal vesicle with PSA of 2.2 s/p brachytherapy in 2022 for treatment of Gleason 4+3 adenocarcinoma of the prostate.  Today, we talked to the patient about the recent biopsy findings and PSMA PET imaging which are somewhat discordant given his recent pathology did not confirm malignancy in the tissue sampled from the right seminal vesicle. We again discussed the natural history of prostate cancer and general treatment, highlighting the role of radiotherapy in the management of locally recurrent disease.  Based on his rising, posttreatment PSA, pretreatment pathology which confirms the majority of his disease was in the right side of the prostate and recent PSMA PET findings suggesting isolated activity in the right seminal vesicle, we suspect that this is indeed a local recurrence but there is some ambiguity based on his recent pathology findings that suggest atypia only, no obvious malignancy in the sampled tissue.  We discussed the option to continue to monitor the PSA closely and repeat a PSMA PET scan in the future to see if we can more clearly identify disease recurrence and move forward with metastasis directed therapy at that time versus proceeding with metastasis directed therapy now, with 5 fraction stereotactic body radiotherapy (SBRT) directed at the PET positive disease in the right seminal vesicle.  We discussed the available radiation techniques, and focused on the details and logistics of delivery as a pertains to SBRT. The patient appears to have a good understanding of his disease and our recommendations and was encouraged to ask questions that  were answered to his stated satisfaction.  At the end of our discussion, the patient would like to proceed with the recommended 5 fraction course of stereotactic body radiotherapy (SBRT) to the right seminal vesicle.  He has freely signed written consent to proceed today in the office and a copy of this document will be placed in his medical record.  He is tentatively scheduled for CT simulation/treatment planning at 9 AM on 03/27/2024 so we will share our discussion with Dr. Devere and proceed with treatment planning accordingly, in anticipation of beginning his treatments in early January 2026.  We have recommended that he have a PSA drawn in the urology office, first available, to establish a current baseline prior to starting treatment.  We enjoyed meeting with him again today and look forward to continuing to participate in his care.  We personally spent 55 minutes in this encounter including chart review, reviewing radiological studies, meeting face-to-face with the patient, entering orders and completing documentation.    Sabra MICAEL Rusk, PA-C    Donnice Barge, MD  [facility]  Radiation Oncology Direct Dial: [phone]  Fax: [phone] [email or web address]  Skype  LinkedIn

## 2024-03-15 ENCOUNTER — Encounter: Payer: Self-pay | Admitting: Cardiology

## 2024-03-15 NOTE — Telephone Encounter (Signed)
 Patient with diagnosis of A Fib on Eliquis  for anticoagulation.    Procedure: FINE NEEDLE ASPIRATION OF A THYROID  NODULE  Date of procedure: TBD   CHA2DS2-VASc Score = 4  This indicates a 4.8% annual risk of stroke. The patient's score is based upon: CHF History: 1 HTN History: 1 Diabetes History: 0 Stroke History: 0 Vascular Disease History: 0 Age Score: 2 Gender Score: 0     CrCl 77 ml/min Platelet count 194K  Patient has not had an Afib/aflutter ablation in the last 3 months, DCCV within the last 4 weeks or a watchman implanted in the last 45 days    Per office protocol, patient can hold Eliquis  for 1 day prior to procedure.    **This guidance is not considered finalized until pre-operative APP has relayed final recommendations.**

## 2024-03-15 NOTE — Progress Notes (Signed)
 TO BE COMPLETED BY RADIATION ONCOLOGIST OFFICE:   Patient Name: Robert Ochoa   Date of Birth: Mar 03, 1946   Radiation Oncologist: Dr. Donnice Barge   Site to be Treated: Prostate   Will x-rays >10 MV be used? No   Will the radiation be >10 cm from the device? Yes   Planned Treatment Start Date: 03/20/2024   TO BE COMPLETED BY CARDIOLOGIST OFFICE:   Device Information:  Pacemaker []      ICD [x]    Brand: St. Jude/Abbott: [phone] Model #: [id number] Ellipse DR  Serial Number: [id number]     Date of Placement: 03/28/2019  Site of Placement: L chest  Remote Device Check--Frequency: q91 days   Last Check: 01/24/2024  Is the Patient Pacer Dependent?:  Yes []   No [x]   Does cardiologist request Radiation Oncology to schedule device testing by vendor for the following:  Prior to the Initiation of Treatments?  Yes []  No [x]  During Treatments?  Yes []  No [x]  Post Radiation Treatments?  Yes []  No [x]   Is device monitoring necessary by vendor/cardiologist team during treatments?  Yes []   No [x]   Is cardiac monitoring by Radiation Oncology nursing necessary during treatments? Yes []   No [x]   Do you recommend device be relocated prior to Radiation Treatment? Yes []   No [x]   **PLEASE LIST ANY NOTES OR SPECIAL REQUESTS: Patient compliant with remote monitoring. No specific action needed.      CARDIOLOGIST SIGNATURE:  Dr. Soyla Norton Per Device Clinic Standing Orders, Rozelle JONELLE Banter  03/15/2024 3:20 PM  **Please route completed form back to Radiation Oncology Nursing and P [facility] RAD ONC ADMIN, OR send an update if there will be a delay in having form completed by expected start date.  **Call [phone] if you have any questions or do not get an in-basket response from a Radiation Oncology staff member

## 2024-03-15 NOTE — Telephone Encounter (Signed)
° °  Patient Name: Robert Ochoa  DOB: 08/14/1945 MRN: [id number]  Primary Cardiologist: None  Chart reviewed as part of pre-operative protocol coverage. Pre-op clearance already addressed by colleagues in earlier phone notes. To summarize recommendations:  Patient cleared less than a month ago for a similar procedure.  We recently received a clearance request for fine-needle aspiration of the thyroid  nodule.  As long as no new symptoms since his phone call 11/18, can proceed with the biopsy without any further cardiovascular testing.  -1.  Preoperative Cardiovascular Risk Assessment:      Mr. Stradling perioperative risk of a major cardiac event is 0.4% according to the Revised Cardiac Risk Index (RCRI).  Therefore, he is at low risk for perioperative complications.   His functional capacity is fair -- but is due to his relatively debilitating back pain-- at 4.4 METs according to the Duke Activity Status Index (DASI). Recommendations: According to ACC/AHA guidelines, no further cardiovascular testing needed.  The patient may proceed to surgery at acceptable risk.   Antiplatelet and/or Anticoagulation Recommendations:     Per office protocol, patient can hold Eliquis  for 1 day prior to procedure.  Please resume medically safe to do so.   The patient was advised that if he develops new symptoms prior to surgery to contact our office to arrange for a follow-up visit, and he verbalized understanding.  Will route this bundled recommendation to requesting provider via Epic fax function and remove from pre-op pool. Please call with questions.  Orren LOISE Fabry, PA-C 03/15/2024, 4:54 PM

## 2024-03-16 NOTE — Progress Notes (Signed)
 RN reached out to [facility] to request a repeat PSA prior to upcoming SBRT treatment.  Triage nurse will obtain order from MD and coordinate for patient to have PSA.

## 2024-03-17 NOTE — Progress Notes (Signed)
 Patient will have PSA at [facility] today.  Will follow up with results.

## 2024-03-22 ENCOUNTER — Other Ambulatory Visit: Payer: Self-pay | Admitting: Cardiovascular Disease

## 2024-03-27 ENCOUNTER — Ambulatory Visit
Admission: RE | Admit: 2024-03-27 | Discharge: 2024-03-27 | Disposition: A | Discharge status: Home / Self Care | Source: Ambulatory Visit | Attending: Radiation Oncology | Admitting: Radiation Oncology

## 2024-03-27 DIAGNOSIS — C61 Malignant neoplasm of prostate: Secondary | ICD-10-CM | POA: Diagnosis present

## 2024-03-27 NOTE — Progress Notes (Signed)
" °  Radiation Oncology         [phone] ________________________________  Name: Robert Ochoa MRN: [id number]  Date: 03/27/2024  DOB: [birth year]  STEREOTACTIC BODY RADIOTHERAPY SIMULATION AND TREATMENT PLANNING NOTE    ICD-10-CM   1. Biochemically recurrent malignant neoplasm of prostate [facility])  C61    R97.21       DIAGNOSIS:  78 y.o. man with suspected locally recurrent prostate cancer involving the right seminal vesicle with PSA of 2.2 s/p brachytherapy in 2022 for treatment of Gleason 4+3 adenocarcinoma of the prostate.  NARRATIVE:  The patient was brought to the CT Simulation planning suite.  Identity was confirmed.  All relevant records and images related to the planned course of therapy were reviewed.  The patient freely provided informed written consent to proceed with treatment after reviewing the details related to the planned course of therapy. The consent form was witnessed and verified by the simulation staff.  Then, the patient was set-up in a stable reproducible  supine position for radiation therapy.  A BodyFix immobilization pillow was fabricated for reproducible positioning.  Surface markings were placed.  The CT images were loaded into the planning software.  The gross target volumes (GTV) and planning target volumes (PTV) were delinieated, and avoidance structures were contoured.  Treatment planning then occurred.  The radiation prescription was entered and confirmed.  A total of two complex treatment devices were fabricated in the form of the BodyFix immobilization pillow and a neck accuform cushion.  I have requested : 3D Simulation  I have requested a DVH of the following structures: targets and all normal structures near the target including rectum, femoral heads, bladder and bowel as noted on the radiation plan to maintain doses in adherence with established limits  SPECIAL TREATMENT PROCEDURE:  The planned course of therapy using radiation constitutes a special treatment  procedure. Special care is required in the management of this patient for the following reasons. High dose per fraction requiring special monitoring for increased toxicities of treatment including daily imaging..  The special nature of the planned course of radiotherapy will require increased physician supervision and oversight to ensure patient's safety with optimal treatment outcomes.    This requires extended time and effort.    PLAN:  The patient will receive 40 Gy in 5 fractions to the PET avid recurrence with 20 Gy in 5 fractions to the prostate and SVs.  ________________________________  Donnice LABOR. Patrcia, M.D.  "

## 2024-03-27 NOTE — Progress Notes (Signed)
 Patient had PSA lab at [facility] to have a baseline prior to treatment.  PSA results 2.0.  No change in recommendations.

## 2024-03-28 ENCOUNTER — Ambulatory Visit
Admission: RE | Admit: 2024-03-28 | Discharge: 2024-03-28 | Disposition: A | Discharge status: Home / Self Care | Source: Ambulatory Visit | Attending: Surgery | Admitting: Surgery

## 2024-03-28 DIAGNOSIS — E041 Nontoxic single thyroid nodule: Secondary | ICD-10-CM

## 2024-03-29 ENCOUNTER — Other Ambulatory Visit: Payer: Self-pay

## 2024-03-29 MED ORDER — LISINOPRIL 10 MG PO TABS: 20.0000 mg | ORAL_TABLET | Freq: Every day | ORAL | 0 refills | Status: AC

## 2024-03-29 NOTE — Telephone Encounter (Signed)
 PT checking status of refill. Requesting refill to be sent to  [id number] - [place], [place] - [address] AT [facility] OF [address]

## 2024-04-03 ENCOUNTER — Ambulatory Visit
Admission: RE | Admit: 2024-04-03 | Discharge: 2024-04-03 | Disposition: A | Discharge status: Home / Self Care | Source: Ambulatory Visit | Attending: Radiation Oncology | Admitting: Radiation Oncology

## 2024-04-03 DIAGNOSIS — C61 Malignant neoplasm of prostate: Secondary | ICD-10-CM | POA: Insufficient documentation

## 2024-04-04 ENCOUNTER — Encounter: Payer: Self-pay | Admitting: Urology

## 2024-04-05 ENCOUNTER — Ambulatory Visit: Attending: Cardiology

## 2024-04-05 DIAGNOSIS — I428 Other cardiomyopathies: Secondary | ICD-10-CM

## 2024-04-06 ENCOUNTER — Ambulatory Visit: Payer: Self-pay | Admitting: Cardiology

## 2024-04-06 LAB — CUP PACEART REMOTE DEVICE CHECK
Battery Remaining Longevity: 54 mo
Battery Remaining Percentage: 62 %
Battery Voltage: 2.96 V
Brady Statistic RV Percent Paced: 67 %
Date Time Interrogation Session: 20260107020016
HighPow Impedance: 50 Ohm
HighPow Impedance: 50 Ohm
Implantable Lead Connection Status: 753985
Implantable Lead Connection Status: 753985
Implantable Lead Implant Date: 20101123
Implantable Lead Implant Date: 20101123
Implantable Lead Location: 753859
Implantable Lead Location: 753860
Implantable Lead Model: 7121
Implantable Pulse Generator Implant Date: 20201229
Lead Channel Impedance Value: 340 Ohm
Lead Channel Impedance Value: 400 Ohm
Lead Channel Pacing Threshold Amplitude: 1.25 V
Lead Channel Pacing Threshold Pulse Width: 0.6 ms
Lead Channel Sensing Intrinsic Amplitude: 3.1 mV
Lead Channel Sensing Intrinsic Amplitude: 7.3 mV
Lead Channel Setting Pacing Amplitude: 2.5 V
Lead Channel Setting Pacing Pulse Width: 0.6 ms
Lead Channel Setting Sensing Sensitivity: 0.5 mV
Pulse Gen Serial Number: 8907499

## 2024-04-10 ENCOUNTER — Other Ambulatory Visit: Payer: Self-pay

## 2024-04-10 DIAGNOSIS — C61 Malignant neoplasm of prostate: Secondary | ICD-10-CM | POA: Diagnosis not present

## 2024-04-10 LAB — RAD ONC ARIA SESSION SUMMARY
Course Elapsed Days: 0
Plan Fractions Treated to Date: 1
Plan Prescribed Dose Per Fraction: 8 Gy
Plan Total Fractions Prescribed: 5
Plan Total Prescribed Dose: 40 Gy
Reference Point Dosage Given to Date: 8 Gy
Reference Point Session Dosage Given: 8 Gy
Session Number: 1

## 2024-04-10 NOTE — Progress Notes (Signed)
 Remote PPM Transmission

## 2024-04-11 ENCOUNTER — Ambulatory Visit

## 2024-04-12 ENCOUNTER — Ambulatory Visit
Admission: RE | Admit: 2024-04-12 | Discharge: 2024-04-12 | Disposition: A | Discharge status: Home / Self Care | Source: Ambulatory Visit | Attending: Radiation Oncology | Admitting: Radiation Oncology

## 2024-04-12 ENCOUNTER — Other Ambulatory Visit: Payer: Self-pay

## 2024-04-12 DIAGNOSIS — C61 Malignant neoplasm of prostate: Secondary | ICD-10-CM | POA: Diagnosis not present

## 2024-04-12 LAB — RAD ONC ARIA SESSION SUMMARY
Course Elapsed Days: 2
Plan Fractions Treated to Date: 2
Plan Prescribed Dose Per Fraction: 8 Gy
Plan Total Fractions Prescribed: 5
Plan Total Prescribed Dose: 40 Gy
Reference Point Dosage Given to Date: 16 Gy
Reference Point Session Dosage Given: 8 Gy
Session Number: 2

## 2024-04-13 ENCOUNTER — Ambulatory Visit

## 2024-04-14 ENCOUNTER — Ambulatory Visit
Admission: RE | Admit: 2024-04-14 | Discharge: 2024-04-14 | Disposition: A | Discharge status: Home / Self Care | Source: Ambulatory Visit | Attending: Radiation Oncology | Admitting: Radiation Oncology

## 2024-04-14 ENCOUNTER — Other Ambulatory Visit: Payer: Self-pay

## 2024-04-14 DIAGNOSIS — C61 Malignant neoplasm of prostate: Secondary | ICD-10-CM | POA: Diagnosis not present

## 2024-04-14 LAB — RAD ONC ARIA SESSION SUMMARY
Course Elapsed Days: 4
Plan Fractions Treated to Date: 3
Plan Prescribed Dose Per Fraction: 8 Gy
Plan Total Fractions Prescribed: 5
Plan Total Prescribed Dose: 40 Gy
Reference Point Dosage Given to Date: 24 Gy
Reference Point Session Dosage Given: 8 Gy
Session Number: 3

## 2024-04-18 ENCOUNTER — Ambulatory Visit
Admission: RE | Admit: 2024-04-18 | Discharge: 2024-04-18 | Disposition: A | Source: Ambulatory Visit | Attending: Radiation Oncology

## 2024-04-18 ENCOUNTER — Other Ambulatory Visit: Payer: Self-pay

## 2024-04-18 DIAGNOSIS — C61 Malignant neoplasm of prostate: Secondary | ICD-10-CM | POA: Diagnosis not present

## 2024-04-18 LAB — RAD ONC ARIA SESSION SUMMARY
Course Elapsed Days: 8
Plan Fractions Treated to Date: 4
Plan Prescribed Dose Per Fraction: 8 Gy
Plan Total Fractions Prescribed: 5
Plan Total Prescribed Dose: 40 Gy
Reference Point Dosage Given to Date: 32 Gy
Reference Point Session Dosage Given: 8 Gy
Session Number: 4

## 2024-04-19 NOTE — Progress Notes (Unsigned)
 " [name] [address] [place] [address] Phone: [phone] Subjective:   LILLETTE Berwyn Posey, am serving as a scribe for Dr. Arthea Claudene.  I'm seeing this patient by the request  of:  [name], MD  CC: Back pain and bilateral leg pain  YEP:Dlagzrupcz  02/29/2024 Xray ordered discussed with patient about icing regimen.  Do think it is more secondary to lumbar radiculopathy.    Discussed with patient about the spondylolisthesis.  We discussed with patient with the CT scan that an epidural could be beneficial.  Scheduled in 1 week.  Discussed other treatment options including medications, formal physical therapy, discussed the possibility of CT pelvis if this continues as well.  Patient will follow-up with me again in 6 weeks after the epidural. I personally spent a total of 33 minutes in the care of the patient today including preparing to see the patient, getting/reviewing separately obtained history, performing a medically appropriate exam/evaluation, counseling and educating, documenting clinical information in the EHR, independently interpreting results, communicating results, and coordinating care.  Updated 04/20/2024 TORIANO AIKEY is a 79 y.o. male coming in with complaint of back and B knee pain. Patient states that the epidural worked well that he had on December 9th. Reduced pain and radicular symptoms. Now notices a weakness in LE musculature L>R. Also notes that his balance has declined. Has been trying to do HEP. Has been walking a 1/4 of a mile and then has to rest.        Past Medical History:  Diagnosis Date   Arrhythmogenic right ventricular dysplas (HCC)    Arrhythmogenic right ventricular dysplasia (HCC)    Hx of VT s/p dual-chamber St. Jude ICD. Cardiac MRI 2010 showed moderately dilated RV with hypokinesis, LV EF 47%. EF 50-55% 2010 but down to 45% 05/2011. Cath 01/2009 without significant CAD.   Atrial fibrillation or  flutter    Paroxysmal. Started on Pradaxa  05/2011.   Cardiac defibrillator in place    Elevated PSA    LV dysfunction    per echo 10/2011; EF 35%  // Echo 2/18: EF 40-45, diff HK, Gr 1 DD, mild dilated aortic root, mod LAE, mod RVE, mild reduced RVSF, mod RAE   Prostate cancer (HCC)    Septic shock(785.52) 10-27-11 cause unknown   with associated respiratory/renal failure/PAF/bradycardia   Ventricular tachycardia (HCC)    ICD discharges 05/2011 for this & AF-RVR - started on amiodarone  then.   Wears glasses    Past Surgical History:  Procedure Laterality Date   APPENDECTOMY  2002   ATRIAL FIBRILLATION ABLATION N/A 02/04/2022   Procedure: ATRIAL FIBRILLATION ABLATION;  Surgeon: [name], MD;  Location: [facility];  Service: Cardiovascular;  Laterality: N/A;   CARDIAC DEFIBRILLATOR PLACEMENT  02/19/09    St,. Jude    CARDIOVERSION N/A 05/28/2022   Procedure: CARDIOVERSION;  Surgeon: [name], MD;  Location: [facility] ENDOSCOPY;  Service: Cardiovascular;  Laterality: N/A;   ICD GENERATOR CHANGEOUT N/A 03/27/2021   Procedure: ICD GENERATOR CHANGEOUT;  Surgeon: [name], MD;  Location: [facility];  Service: Cardiovascular;  Laterality: N/A;   IMPLANTABLE CARDIOVERTER DEFIBRILLATOR (ICD) GENERATOR CHANGE N/A 03/01/2013   Procedure: ICD GENERATOR CHANGE;  Surgeon: Elspeth BROCKS Sage, MD;  Location: [facility] CATH LAB;  Service: Cardiovascular;  Laterality: N/A;   IMPLANTABLE CARDIOVERTER DEFIBRILLATOR GENERATOR CHANGE  03/01/13   for premature battery depletetion.  Now has a SJM Ellipse DR device placed by  Dr Fernande   PROSTATE BIOPSY  11/2019   RADIOACTIVE SEED IMPLANT N/A 04/30/2020   Procedure: RADIOACTIVE SEED IMPLANT/BRACHYTHERAPY IMPLANT;  Surgeon: [name], MD;  Location: [facility];  Service: Urology;  Laterality: N/A;   SPACE OAR INSTILLATION N/A 04/30/2020   Procedure: SPACE OAR INSTILLATION;  Surgeon: [name], MD;  Location:  [facility];  Service: Urology;  Laterality: N/A;   Social History   Socioeconomic History   Marital status: Single    Spouse name: Not on file   Number of children: 1   Years of education: Not on file   Highest education level: Not on file  Occupational History    Employer: [employer]    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 Self employed.    Social Drivers of Health   Tobacco Use: Low Risk (03/14/2024)   Patient History    Smoking Tobacco Use: Never    Smokeless Tobacco Use: Never    Passive Exposure: Not on file  Financial Resource Strain: Not on file  Food Insecurity: No Food Insecurity (01/31/2024)   Epic    Worried About Programme Researcher, Broadcasting/film/video in the Last Year: Never true    Ran Out of Food in the Last Year: Never true  Transportation Needs: No Transportation Needs (01/31/2024)   Epic    Lack of Transportation (Medical): No    Lack of Transportation (Non-Medical): No  Physical Activity: Not on file  Stress: Not on file  Social Connections: Not on file  Depression (PHQ2-9): Low Risk (01/31/2024)   Depression (PHQ2-9)    PHQ-2 Score: 0  Alcohol Screen: Low Risk (01/31/2024)   Alcohol Screen    Last Alcohol Screening Score (AUDIT): 0  Housing: Unknown (03/08/2024)   Received from [facility]   Epic    Unable to Pay for Housing in the Last Year: Not on file    Number of Times Moved in the Last Year: Not on file    At any time in the past 12 months,  were you homeless or living in a shelter (including now)?: No  Utilities: Not At Risk (01/31/2024)   Epic    Threatened with loss of utilities: No  Health Literacy: Not on file   Allergies[1] Family History  Problem Relation Age of Onset   Heart attack Father    Allergies Mother    Cancer Paternal Aunt        type unknown   Prostate cancer Cousin    Colon cancer Neg Hx    Pancreatic cancer Neg Hx    Breast cancer Neg Hx     Current Outpatient Medications (Cardiovascular):    amiodarone  (PACERONE ) 100 MG tablet, TAKE 1 TABLET(100 MG) BY MOUTH DAILY   furosemide  (LASIX ) 20 MG tablet, TAKE 1 TABLET BY MOUTH DAILY AS NEEDED FOR SWELLING   lisinopril  (ZESTRIL ) 10 MG tablet,  Take 2 tablets (20 mg total) by mouth daily.   metoprolol  succinate (TOPROL -XL) 50 MG 24 hr tablet, Take 1 tablet (50 mg total) by mouth daily. Take with or immediately following a meal.   sildenafil  (REVATIO ) 20 MG tablet, Take 20-100 mg by mouth daily as needed (ED).  Current Outpatient Medications (Hematological):    ELIQUIS  5 MG TABS tablet, TAKE 1 TABLET BY MOUTH TWICE DAILY  Current Outpatient Medications (Other):    Calcium-Magnesium -Zinc (CAL-MAG-ZINC PO), Take 1 tablet by mouth daily.   Carboxymethylcellul-Glycerin (LUBRICATING EYE DROPS OP), Place 1 drop into both eyes daily as needed (dry eyes).   gabapentin  (NEURONTIN ) 100 MG capsule, Take 2 capsules (200 mg total) by mouth at bedtime.   Omega-3 Fatty Acids (OMEGA 3 PO), Take 1,600 mg by mouth daily.   tamsulosin (FLOMAX) 0.4 MG CAPS capsule, Take 0.4 mg by mouth daily.   triamcinolone cream (KENALOG) 0.1 %, Apply 1 Application topically 2 (two) times daily as needed (itchy scalp). (Patient taking differently: Apply 1 Application topically as needed (itchy scalp).)   vitamin C (ASCORBIC ACID) 500 MG tablet, Take 1,000 mg by mouth daily.   Reviewed prior external information including notes and imaging from  primary care provider As well as notes that  were available from care everywhere and other healthcare systems.  Past medical history, social, surgical and family history all reviewed in electronic medical record.  No pertanent information unless stated regarding to the chief complaint.   Review of Systems:  No headache, visual changes, nausea, vomiting, diarrhea, constipation, dizziness, abdominal pain, skin rash, fevers, chills, night sweats, weight loss, swollen lymph nodes, body aches, joint swelling, chest pain, shortness of breath, mood changes. POSITIVE muscle aches, difficulty with balance  Objective  Blood pressure 112/78, pulse 66, height 6' 2 (1.88 m), weight 220 lb (99.8 kg), SpO2 97%.   General: No apparent distress alert and oriented x3 mood and affect normal, dressed appropriately.  HEENT: Pupils equal, extraocular movements intact  Respiratory: Patient's speak in full sentences and does not appear short of breath  Patient is starts with a broad-based gait but then seems to get more normalized though further he walks.  Patient does have some difficulty going from seated to standing position.  4-5 strength of the lower extremities but it does seem to be symmetric.    Impression and Recommendations:    The above documentation has been reviewed and is accurate and complete Saramarie Stinger M Tatum Massman, DO        [1] No Known Allergies  "

## 2024-04-20 ENCOUNTER — Ambulatory Visit: Admitting: Family Medicine

## 2024-04-20 ENCOUNTER — Other Ambulatory Visit: Payer: Self-pay

## 2024-04-20 ENCOUNTER — Ambulatory Visit
Admission: RE | Admit: 2024-04-20 | Discharge: 2024-04-20 | Disposition: A | Discharge status: Home / Self Care | Source: Ambulatory Visit | Attending: Radiation Oncology | Admitting: Radiation Oncology

## 2024-04-20 VITALS — BP 112/78 | HR 66 | Ht 74.0 in | Wt 220.0 lb

## 2024-04-20 DIAGNOSIS — M4317 Spondylolisthesis, lumbosacral region: Secondary | ICD-10-CM | POA: Diagnosis not present

## 2024-04-20 DIAGNOSIS — R5383 Other fatigue: Secondary | ICD-10-CM

## 2024-04-20 DIAGNOSIS — R29898 Other symptoms and signs involving the musculoskeletal system: Secondary | ICD-10-CM | POA: Diagnosis not present

## 2024-04-20 DIAGNOSIS — C61 Malignant neoplasm of prostate: Secondary | ICD-10-CM | POA: Diagnosis not present

## 2024-04-20 LAB — CBC WITH DIFFERENTIAL/PLATELET
Basophils Absolute: 0 K/uL (ref 0.0–0.1)
Basophils Relative: 0.4 % (ref 0.0–3.0)
Eosinophils Absolute: 0.1 K/uL (ref 0.0–0.7)
Eosinophils Relative: 1.1 % (ref 0.0–5.0)
HCT: 46.7 % (ref 39.0–52.0)
Hemoglobin: 15.4 g/dL (ref 13.0–17.0)
Lymphocytes Relative: 13.5 % (ref 12.0–46.0)
Lymphs Abs: 0.8 K/uL (ref 0.7–4.0)
MCHC: 33.1 g/dL (ref 30.0–36.0)
MCV: 90 fl (ref 78.0–100.0)
Monocytes Absolute: 0.8 K/uL (ref 0.1–1.0)
Monocytes Relative: 14.1 % — ABNORMAL HIGH (ref 3.0–12.0)
Neutro Abs: 4.1 K/uL (ref 1.4–7.7)
Neutrophils Relative %: 70.9 % (ref 43.0–77.0)
Platelets: 184 K/uL (ref 150.0–400.0)
RBC: 5.19 Mil/uL (ref 4.22–5.81)
RDW: 15.3 % (ref 11.5–15.5)
WBC: 5.8 K/uL (ref 4.0–10.5)

## 2024-04-20 LAB — T3, FREE: T3, Free: 3.4 pg/mL (ref 2.3–4.2)

## 2024-04-20 LAB — SEDIMENTATION RATE: Sed Rate: 13 mm/h (ref 0–20)

## 2024-04-20 LAB — COMPREHENSIVE METABOLIC PANEL WITH GFR
ALT: 21 U/L (ref 3–53)
AST: 21 U/L (ref 5–37)
Albumin: 4.1 g/dL (ref 3.5–5.2)
Alkaline Phosphatase: 69 U/L (ref 39–117)
BUN: 29 mg/dL — ABNORMAL HIGH (ref 6–23)
CO2: 25 meq/L (ref 19–32)
Calcium: 9.1 mg/dL (ref 8.4–10.5)
Chloride: 104 meq/L (ref 96–112)
Creatinine, Ser: 1.09 mg/dL (ref 0.40–1.50)
GFR: 64.61 mL/min
Glucose, Bld: 82 mg/dL (ref 70–99)
Potassium: 4.3 meq/L (ref 3.5–5.1)
Sodium: 138 meq/L (ref 135–145)
Total Bilirubin: 0.7 mg/dL (ref 0.2–1.2)
Total Protein: 6.7 g/dL (ref 6.0–8.3)

## 2024-04-20 LAB — RAD ONC ARIA SESSION SUMMARY
Course Elapsed Days: 10
Plan Fractions Treated to Date: 5
Plan Prescribed Dose Per Fraction: 8 Gy
Plan Total Fractions Prescribed: 5
Plan Total Prescribed Dose: 40 Gy
Reference Point Dosage Given to Date: 40 Gy
Reference Point Session Dosage Given: 8 Gy
Session Number: 5

## 2024-04-20 LAB — FOLATE: Folate: 18.4 ng/mL

## 2024-04-20 LAB — VITAMIN B12: Vitamin B-12: 424 pg/mL (ref 211–911)

## 2024-04-20 LAB — T4, FREE: Free T4: 1.11 ng/dL (ref 0.60–1.60)

## 2024-04-20 LAB — TSH: TSH: 1.5 u[IU]/mL (ref 0.35–5.50)

## 2024-04-20 NOTE — Assessment & Plan Note (Signed)
 Has responded extremely well though to the epidural.  Back pain is significantly improved.  Unfortunately having more weakness noted at this time.  This seems to be the lower extremities.  Difficult to assess secondary to either peripheral neuropathy or a lumbar radiculopathy.  Could be potentially also secondary to vitamin deficiencies.  Workup ordered today includes laboratory workup, nerve conduction study to further evaluate.  Depending on findings we can discuss the possibility of balance and coordination physical therapy.  Patient is in agreement with the plan.  Follow-up again in 2 months

## 2024-04-20 NOTE — Patient Instructions (Signed)
 Labs Nerve conduction study-[facility] will call you See me again in 8 weeks

## 2024-04-21 LAB — PTH, INTACT AND CALCIUM
Calcium: 9 mg/dL (ref 8.6–10.3)
PTH: 47 pg/mL (ref 16–77)

## 2024-04-22 NOTE — Radiation Completion Notes (Signed)
 Patient Name: Robert Ochoa, Robert Ochoa MRN: [id number] Date of Birth: 12/26/1945 Referring Physician: Lonni Han, M.D. Date of Service: 2024-04-22 Radiation Oncologist: Adina Barge, M.D. [facility]                             RADIATION ONCOLOGY END OF TREATMENT NOTE     Diagnosis: C61 Malignant neoplasm of prostate Staging on 2019-12-15: Malignant neoplasm of prostate (HCC) T=cT1c, N=cN0, M=cM0 Intent: Curative     ==========DELIVERED PLANS==========  First Treatment Date: 2024-04-10 Last Treatment Date: 2024-04-20   Plan Name: Prostate_SBRT Site: Prostate Technique: SBRT/SRT-IMRT Mode: Photon Dose Per Fraction: 8 Gy Prescribed Dose (Delivered / Prescribed): 40 Gy / 40 Gy Prescribed Fxs (Delivered / Prescribed): 5 / 5     ==========ON TREATMENT VISIT DATES========== 2024-04-10, 2024-04-12, 2024-04-14, 2024-04-18, 2024-04-20, 2024-04-20     ==========UPCOMING VISITS========== 07/05/2024 [facility] HOME REMOTE PACER [facility] CVD HVT DEVICE REMOTES  06/15/2024 [facility] SPORTS MEDICINE OFFICE VISIT Claudene Arthea HERO, DO  05/10/2024 [facility]  US  THYROID  BIOPSY [facility]  2  05/09/2024 [facility] PHYS DEFIB CK Inocencio Soyla Lunger, MD  04/27/2024 [facility] [address] Donnice SAUNDERS, DPM        ==========APPENDIX - ON TREATMENT VISIT NOTES==========   See weekly On Treatment Notes in Epic for details in the Media tab (listed as Progress notes on the On Treatment Visit Dates listed above).

## 2024-04-26 ENCOUNTER — Ambulatory Visit: Payer: Self-pay | Admitting: Surgery

## 2024-04-26 NOTE — Progress Notes (Signed)
 USN guided biopsy scheduled with radiology on 05/10/2024.  Await results.  Krystal Spinner, MD [facility] practice Office: [phone]

## 2024-04-27 ENCOUNTER — Ambulatory Visit (INDEPENDENT_AMBULATORY_CARE_PROVIDER_SITE_OTHER): Admitting: Podiatry

## 2024-04-27 DIAGNOSIS — M7751 Other enthesopathy of right foot: Secondary | ICD-10-CM

## 2024-04-27 MED ORDER — DEXAMETHASONE SODIUM PHOSPHATE 120 MG/30ML IJ SOLN
2.0000 mg | Freq: Once | INTRAMUSCULAR | Status: AC
  Administered 2024-04-27: 2 mg via INTRA_ARTICULAR

## 2024-04-27 NOTE — Progress Notes (Signed)
 Subjective: Chief Complaint  Patient presents with   Callouses    Callous on rt 5th submet base. Had previous treatment by Dr. Verta and needs callous treatment today. Not diabetic. Takes Eliquis .    79 year old male presents the office today with above concerns.  He states that he has had pain to the bottom of his right foot pointing to submetatarsal 5 on the right foot where he gets discomfort.  He does not recall any injuries.  The area is tender with pressure.  He denies any open lesions or any drainage.  No swelling or redness.   Objective: AAO x3, NAD DP/PT pulses palpable bilaterally, CRT less than 3 seconds Cavus foot type is present.  There is hyperkeratotic lesion right foot submetatarsal 5.  There is no underlying ulceration, drainage or any signs of infection noted today.  Minimal edema around the callus.  No erythema, warmth. No pain with calf compression, swelling, warmth, erythema  Assessment: Hyperkeratotic lesion, capsulitis right foot  Plan: -All treatment options discussed with the patient including all alternatives, risks, complications.  -Elected proceed with a steroid injection.  Cleaned the skin with alcohol.  A mixture of 0.5 mL of lidocaine  plain, 0.5 cc of dexamethasone  phosphate was infiltrated into around the fifth MPJ without complications.  Postinjection care discussed.  Tolerated well.  Instructed debride the hyperkeratotic lesion with any complications or bleeding.  Dispensed offloading pads.  Discussed moisturizer daily. -Patient encouraged to call the office with any questions, concerns, change in symptoms.   Donnice JONELLE Fees DPM

## 2024-04-27 NOTE — Patient Instructions (Signed)

## 2024-05-03 ENCOUNTER — Encounter: Payer: Self-pay | Admitting: Neurology

## 2024-05-04 ENCOUNTER — Other Ambulatory Visit: Payer: Self-pay

## 2024-05-04 DIAGNOSIS — R202 Paresthesia of skin: Secondary | ICD-10-CM

## 2024-05-09 ENCOUNTER — Ambulatory Visit: Admitting: Cardiology

## 2024-05-10 ENCOUNTER — Other Ambulatory Visit

## 2024-05-16 ENCOUNTER — Ambulatory Visit: Admitting: Podiatry

## 2024-06-05 ENCOUNTER — Encounter

## 2024-06-15 ENCOUNTER — Ambulatory Visit: Admitting: Family Medicine

## 2024-06-29 ENCOUNTER — Encounter: Payer: Self-pay | Admitting: Neurology

## 2024-07-05 ENCOUNTER — Ambulatory Visit

## 2024-09-04 ENCOUNTER — Encounter

## 2024-10-04 ENCOUNTER — Ambulatory Visit

## 2024-12-05 ENCOUNTER — Encounter

## 2025-01-03 ENCOUNTER — Ambulatory Visit

## 2025-03-05 ENCOUNTER — Encounter

## 2025-04-04 ENCOUNTER — Ambulatory Visit

## 2025-06-04 ENCOUNTER — Encounter
